# Patient Record
Sex: Female | Born: 1950
Health system: Southern US, Community
[De-identification: ages and names within clinical notes are randomized; demographics above are authoritative.]

## PROBLEM LIST (undated history)

## (undated) DIAGNOSIS — Z5189 Encounter for other specified aftercare: Secondary | ICD-10-CM

## (undated) DIAGNOSIS — B009 Herpesviral infection, unspecified: Secondary | ICD-10-CM

## (undated) DIAGNOSIS — O98519 Other viral diseases complicating pregnancy, unspecified trimester: Secondary | ICD-10-CM

## (undated) DIAGNOSIS — D869 Sarcoidosis, unspecified: Secondary | ICD-10-CM

## (undated) DIAGNOSIS — M48061 Spinal stenosis, lumbar region without neurogenic claudication: Secondary | ICD-10-CM

## (undated) DIAGNOSIS — M549 Dorsalgia, unspecified: Secondary | ICD-10-CM

## (undated) DIAGNOSIS — J45909 Unspecified asthma, uncomplicated: Secondary | ICD-10-CM

## (undated) DIAGNOSIS — G4733 Obstructive sleep apnea (adult) (pediatric): Secondary | ICD-10-CM

## (undated) DIAGNOSIS — N3946 Mixed incontinence: Secondary | ICD-10-CM

## (undated) DIAGNOSIS — Z8619 Personal history of other infectious and parasitic diseases: Secondary | ICD-10-CM

## (undated) DIAGNOSIS — N901 Moderate vulvar dysplasia: Secondary | ICD-10-CM

## (undated) DIAGNOSIS — D219 Benign neoplasm of connective and other soft tissue, unspecified: Secondary | ICD-10-CM

## (undated) DIAGNOSIS — M722 Plantar fascial fibromatosis: Secondary | ICD-10-CM

## (undated) DIAGNOSIS — K219 Gastro-esophageal reflux disease without esophagitis: Secondary | ICD-10-CM

## (undated) DIAGNOSIS — IMO0002 Reserved for concepts with insufficient information to code with codable children: Secondary | ICD-10-CM

## (undated) DIAGNOSIS — Z8742 Personal history of other diseases of the female genital tract: Secondary | ICD-10-CM

## (undated) DIAGNOSIS — J189 Pneumonia, unspecified organism: Secondary | ICD-10-CM

## (undated) DIAGNOSIS — R55 Syncope and collapse: Secondary | ICD-10-CM

## (undated) DIAGNOSIS — J309 Allergic rhinitis, unspecified: Secondary | ICD-10-CM

## (undated) DIAGNOSIS — G8929 Other chronic pain: Secondary | ICD-10-CM

## (undated) DIAGNOSIS — E119 Type 2 diabetes mellitus without complications: Secondary | ICD-10-CM

## (undated) DIAGNOSIS — D126 Benign neoplasm of colon, unspecified: Secondary | ICD-10-CM

## (undated) DIAGNOSIS — F419 Anxiety disorder, unspecified: Secondary | ICD-10-CM

## (undated) DIAGNOSIS — N8501 Benign endometrial hyperplasia: Secondary | ICD-10-CM

## (undated) DIAGNOSIS — R9430 Abnormal result of cardiovascular function study, unspecified: Secondary | ICD-10-CM

## (undated) DIAGNOSIS — N9489 Other specified conditions associated with female genital organs and menstrual cycle: Secondary | ICD-10-CM

## (undated) DIAGNOSIS — F410 Panic disorder [episodic paroxysmal anxiety] without agoraphobia: Secondary | ICD-10-CM

## (undated) DIAGNOSIS — D509 Iron deficiency anemia, unspecified: Secondary | ICD-10-CM

## (undated) DIAGNOSIS — K449 Diaphragmatic hernia without obstruction or gangrene: Secondary | ICD-10-CM

## (undated) HISTORY — DX: Benign neoplasm of connective and other soft tissue, unspecified: D21.9

## (undated) HISTORY — DX: Mixed incontinence: N39.46

## (undated) HISTORY — DX: Other specified conditions associated with female genital organs and menstrual cycle: N94.89

## (undated) HISTORY — DX: Morbid (severe) obesity due to excess calories: E66.01

## (undated) HISTORY — DX: Moderate vulvar dysplasia: N90.1

## (undated) HISTORY — DX: Herpesviral infection, unspecified: B00.9

## (undated) HISTORY — PX: COLPOSCOPY VULVA W/ BIOPSY: SUR282

## (undated) HISTORY — DX: Benign endometrial hyperplasia: N85.01

## (undated) HISTORY — DX: Diaphragmatic hernia without obstruction or gangrene: K44.9

## (undated) HISTORY — DX: Allergic rhinitis, unspecified: J30.9

## (undated) HISTORY — DX: Obstructive sleep apnea (adult) (pediatric): G47.33

## (undated) HISTORY — DX: Herpesviral infection, unspecified: O98.519

## (undated) HISTORY — DX: Syncope and collapse: R55

## (undated) HISTORY — DX: Unspecified asthma, uncomplicated: J45.909

## (undated) HISTORY — DX: Encounter for other specified aftercare: Z51.89

## (undated) HISTORY — DX: Plantar fascial fibromatosis: M72.2

## (undated) HISTORY — PX: ECTOPIC PREGNANCY SURGERY: SHX613

## (undated) HISTORY — PX: VULVECTOMY: SHX1086

## (undated) HISTORY — DX: Anxiety disorder, unspecified: F41.9

## (undated) HISTORY — DX: Gastro-esophageal reflux disease without esophagitis: K21.9

## (undated) HISTORY — DX: Abnormal result of cardiovascular function study, unspecified: R94.30

## (undated) HISTORY — DX: Reserved for concepts with insufficient information to code with codable children: IMO0002

## (undated) HISTORY — DX: Benign neoplasm of colon, unspecified: D12.6

## (undated) HISTORY — DX: Sarcoidosis, unspecified: D86.9

## (undated) HISTORY — DX: Pneumonia, unspecified organism: J18.9

## (undated) HISTORY — DX: Iron deficiency anemia, unspecified: D50.9

## (undated) HISTORY — DX: Personal history of other infectious and parasitic diseases: Z86.19

## (undated) HISTORY — DX: Personal history of other diseases of the female genital tract: Z87.42

---

## 1974-12-06 DIAGNOSIS — IMO0001 Reserved for inherently not codable concepts without codable children: Secondary | ICD-10-CM

## 1974-12-06 DIAGNOSIS — Z5189 Encounter for other specified aftercare: Secondary | ICD-10-CM

## 1974-12-06 HISTORY — DX: Reserved for inherently not codable concepts without codable children: IMO0001

## 1974-12-06 HISTORY — DX: Encounter for other specified aftercare: Z51.89

## 1994-12-06 DIAGNOSIS — D869 Sarcoidosis, unspecified: Secondary | ICD-10-CM

## 1994-12-06 HISTORY — DX: Sarcoidosis, unspecified: D86.9

## 2005-12-06 HISTORY — PX: DILATION AND CURETTAGE OF UTERUS: SHX78

## 2006-12-06 DIAGNOSIS — R87619 Unspecified abnormal cytological findings in specimens from cervix uteri: Secondary | ICD-10-CM

## 2006-12-06 DIAGNOSIS — IMO0002 Reserved for concepts with insufficient information to code with codable children: Secondary | ICD-10-CM

## 2006-12-06 HISTORY — DX: Reserved for concepts with insufficient information to code with codable children: IMO0002

## 2006-12-06 HISTORY — DX: Unspecified abnormal cytological findings in specimens from cervix uteri: R87.619

## 2007-12-07 LAB — HM COLONOSCOPY: HM Colonoscopy: NORMAL

## 2008-09-04 ENCOUNTER — Encounter: Payer: Self-pay | Admitting: Pulmonary Disease

## 2008-09-24 ENCOUNTER — Encounter: Payer: Self-pay | Admitting: Pulmonary Disease

## 2008-10-08 ENCOUNTER — Encounter: Payer: Self-pay | Admitting: Pulmonary Disease

## 2008-10-14 ENCOUNTER — Encounter: Payer: Self-pay | Admitting: Pulmonary Disease

## 2008-10-23 ENCOUNTER — Encounter: Payer: Self-pay | Admitting: Pulmonary Disease

## 2008-11-06 ENCOUNTER — Encounter: Payer: Self-pay | Admitting: Pulmonary Disease

## 2008-11-18 ENCOUNTER — Encounter: Payer: Self-pay | Admitting: Pulmonary Disease

## 2008-12-06 HISTORY — DX: Morbid (severe) obesity due to excess calories: E66.01

## 2009-01-27 ENCOUNTER — Encounter: Payer: Self-pay | Admitting: Pulmonary Disease

## 2009-02-21 ENCOUNTER — Encounter: Payer: Self-pay | Admitting: Gastroenterology

## 2009-02-25 ENCOUNTER — Encounter: Payer: Self-pay | Admitting: Gastroenterology

## 2009-02-27 ENCOUNTER — Encounter: Payer: Self-pay | Admitting: Gastroenterology

## 2009-06-30 ENCOUNTER — Ambulatory Visit: Payer: Self-pay | Admitting: Pulmonary Disease

## 2009-06-30 DIAGNOSIS — Z9989 Dependence on other enabling machines and devices: Secondary | ICD-10-CM

## 2009-06-30 DIAGNOSIS — G4733 Obstructive sleep apnea (adult) (pediatric): Secondary | ICD-10-CM | POA: Insufficient documentation

## 2009-06-30 DIAGNOSIS — D860 Sarcoidosis of lung: Secondary | ICD-10-CM | POA: Insufficient documentation

## 2009-06-30 DIAGNOSIS — J4520 Mild intermittent asthma, uncomplicated: Secondary | ICD-10-CM | POA: Insufficient documentation

## 2009-06-30 DIAGNOSIS — E119 Type 2 diabetes mellitus without complications: Secondary | ICD-10-CM | POA: Insufficient documentation

## 2009-06-30 DIAGNOSIS — J45909 Unspecified asthma, uncomplicated: Secondary | ICD-10-CM | POA: Insufficient documentation

## 2009-06-30 DIAGNOSIS — R058 Other specified cough: Secondary | ICD-10-CM | POA: Insufficient documentation

## 2009-06-30 DIAGNOSIS — R05 Cough: Secondary | ICD-10-CM

## 2009-07-02 ENCOUNTER — Encounter: Payer: Self-pay | Admitting: Pulmonary Disease

## 2009-07-08 ENCOUNTER — Encounter: Payer: Self-pay | Admitting: Pulmonary Disease

## 2009-08-01 ENCOUNTER — Emergency Department (HOSPITAL_COMMUNITY): Admission: EM | Admit: 2009-08-01 | Discharge: 2009-08-01 | Payer: Self-pay | Admitting: Emergency Medicine

## 2009-08-22 ENCOUNTER — Ambulatory Visit: Payer: Self-pay | Admitting: Gastroenterology

## 2009-08-22 DIAGNOSIS — K219 Gastro-esophageal reflux disease without esophagitis: Secondary | ICD-10-CM | POA: Insufficient documentation

## 2009-08-26 ENCOUNTER — Encounter: Payer: Self-pay | Admitting: Pulmonary Disease

## 2009-09-03 ENCOUNTER — Ambulatory Visit: Payer: Self-pay | Admitting: Pulmonary Disease

## 2009-09-16 ENCOUNTER — Telehealth: Payer: Self-pay | Admitting: Gastroenterology

## 2009-10-09 ENCOUNTER — Encounter: Admission: RE | Admit: 2009-10-09 | Discharge: 2009-10-09 | Payer: Self-pay | Admitting: Obstetrics and Gynecology

## 2009-10-13 ENCOUNTER — Ambulatory Visit: Payer: Self-pay | Admitting: Gastroenterology

## 2009-10-14 ENCOUNTER — Ambulatory Visit: Payer: Self-pay | Admitting: Pulmonary Disease

## 2009-12-06 DIAGNOSIS — N8501 Benign endometrial hyperplasia: Secondary | ICD-10-CM

## 2009-12-06 DIAGNOSIS — N9489 Other specified conditions associated with female genital organs and menstrual cycle: Secondary | ICD-10-CM

## 2009-12-06 DIAGNOSIS — N3946 Mixed incontinence: Secondary | ICD-10-CM

## 2009-12-06 DIAGNOSIS — Z8742 Personal history of other diseases of the female genital tract: Secondary | ICD-10-CM

## 2009-12-06 HISTORY — DX: Other specified conditions associated with female genital organs and menstrual cycle: N94.89

## 2009-12-06 HISTORY — DX: Mixed incontinence: N39.46

## 2009-12-06 HISTORY — DX: Benign endometrial hyperplasia: N85.01

## 2009-12-06 HISTORY — PX: HYSTEROSCOPY DIAGNOSTIC: PRO49

## 2009-12-06 HISTORY — DX: Personal history of other diseases of the female genital tract: Z87.42

## 2009-12-06 HISTORY — PX: URETHRAL SLING: SHX2621

## 2009-12-10 ENCOUNTER — Ambulatory Visit (HOSPITAL_COMMUNITY): Admission: RE | Admit: 2009-12-10 | Discharge: 2009-12-10 | Payer: Self-pay | Admitting: Gastroenterology

## 2010-01-05 ENCOUNTER — Ambulatory Visit: Payer: Self-pay | Admitting: Gastroenterology

## 2010-01-09 ENCOUNTER — Ambulatory Visit: Payer: Self-pay | Admitting: Pulmonary Disease

## 2010-01-12 ENCOUNTER — Encounter: Admission: RE | Admit: 2010-01-12 | Discharge: 2010-03-07 | Payer: Self-pay | Admitting: Family Medicine

## 2010-01-21 ENCOUNTER — Encounter: Payer: Self-pay | Admitting: Pulmonary Disease

## 2010-01-27 ENCOUNTER — Ambulatory Visit: Payer: Self-pay | Admitting: Gastroenterology

## 2010-02-02 ENCOUNTER — Encounter: Payer: Self-pay | Admitting: Pulmonary Disease

## 2010-02-24 ENCOUNTER — Encounter: Payer: Self-pay | Admitting: Pulmonary Disease

## 2010-02-26 ENCOUNTER — Inpatient Hospital Stay (HOSPITAL_COMMUNITY): Admission: EM | Admit: 2010-02-26 | Discharge: 2010-03-08 | Payer: Self-pay | Admitting: Emergency Medicine

## 2010-03-05 ENCOUNTER — Telehealth (INDEPENDENT_AMBULATORY_CARE_PROVIDER_SITE_OTHER): Payer: Self-pay | Admitting: *Deleted

## 2010-03-06 DIAGNOSIS — Z8619 Personal history of other infectious and parasitic diseases: Secondary | ICD-10-CM

## 2010-03-06 HISTORY — DX: Personal history of other infectious and parasitic diseases: Z86.19

## 2010-03-10 ENCOUNTER — Telehealth: Payer: Self-pay | Admitting: Gastroenterology

## 2010-03-11 ENCOUNTER — Encounter (INDEPENDENT_AMBULATORY_CARE_PROVIDER_SITE_OTHER): Payer: Self-pay | Admitting: *Deleted

## 2010-03-12 ENCOUNTER — Encounter: Payer: Self-pay | Admitting: Nurse Practitioner

## 2010-03-13 ENCOUNTER — Ambulatory Visit: Payer: Self-pay | Admitting: Internal Medicine

## 2010-03-13 ENCOUNTER — Encounter: Payer: Self-pay | Admitting: Nurse Practitioner

## 2010-03-13 LAB — CONVERTED CEMR LAB
ALT: 14 units/L (ref 0–35)
AST: 16 units/L (ref 0–37)
Albumin: 3.3 g/dL — ABNORMAL LOW (ref 3.5–5.2)
Basophils Absolute: 0 10*3/uL (ref 0.0–0.1)
Basophils Relative: 0.2 % (ref 0.0–3.0)
Chloride: 95 meq/L — ABNORMAL LOW (ref 96–112)
Creatinine, Ser: 1.3 mg/dL — ABNORMAL HIGH (ref 0.4–1.2)
Eosinophils Absolute: 0.1 10*3/uL (ref 0.0–0.7)
Glucose, Bld: 96 mg/dL (ref 70–99)
MCV: 88.8 fL (ref 78.0–100.0)
Monocytes Relative: 7.6 % (ref 3.0–12.0)
Sodium: 134 meq/L — ABNORMAL LOW (ref 135–145)
Total Bilirubin: 0.8 mg/dL (ref 0.3–1.2)
Total Protein: 7.1 g/dL (ref 6.0–8.3)
WBC: 11.8 10*3/uL — ABNORMAL HIGH (ref 4.5–10.5)

## 2010-03-19 ENCOUNTER — Telehealth: Payer: Self-pay | Admitting: Nurse Practitioner

## 2010-03-20 ENCOUNTER — Telehealth (INDEPENDENT_AMBULATORY_CARE_PROVIDER_SITE_OTHER): Payer: Self-pay | Admitting: *Deleted

## 2010-03-20 ENCOUNTER — Ambulatory Visit: Payer: Self-pay | Admitting: Internal Medicine

## 2010-03-20 DIAGNOSIS — D509 Iron deficiency anemia, unspecified: Secondary | ICD-10-CM | POA: Insufficient documentation

## 2010-03-20 DIAGNOSIS — IMO0002 Reserved for concepts with insufficient information to code with codable children: Secondary | ICD-10-CM | POA: Insufficient documentation

## 2010-03-20 DIAGNOSIS — N959 Unspecified menopausal and perimenopausal disorder: Secondary | ICD-10-CM | POA: Insufficient documentation

## 2010-04-01 ENCOUNTER — Telehealth: Payer: Self-pay | Admitting: Gastroenterology

## 2010-04-01 ENCOUNTER — Encounter: Payer: Self-pay | Admitting: Gastroenterology

## 2010-04-03 ENCOUNTER — Encounter: Admission: RE | Admit: 2010-04-03 | Discharge: 2010-04-03 | Payer: Self-pay | Admitting: Obstetrics and Gynecology

## 2010-04-06 ENCOUNTER — Encounter: Payer: Self-pay | Admitting: Pulmonary Disease

## 2010-04-07 ENCOUNTER — Telehealth: Payer: Self-pay | Admitting: Gastroenterology

## 2010-04-07 DIAGNOSIS — A0472 Enterocolitis due to Clostridium difficile, not specified as recurrent: Secondary | ICD-10-CM | POA: Insufficient documentation

## 2010-04-09 ENCOUNTER — Ambulatory Visit: Payer: Self-pay | Admitting: Pulmonary Disease

## 2010-04-13 ENCOUNTER — Encounter: Payer: Self-pay | Admitting: Pulmonary Disease

## 2010-04-23 ENCOUNTER — Encounter: Payer: Self-pay | Admitting: Internal Medicine

## 2010-04-28 ENCOUNTER — Ambulatory Visit: Payer: Self-pay | Admitting: Infectious Diseases

## 2010-04-28 ENCOUNTER — Encounter: Payer: Self-pay | Admitting: Internal Medicine

## 2010-04-30 ENCOUNTER — Encounter: Payer: Self-pay | Admitting: Internal Medicine

## 2010-04-30 LAB — CONVERTED CEMR LAB
ALT: 15 units/L (ref 0–35)
AST: 23 units/L (ref 0–37)
Albumin: 3.7 g/dL (ref 3.5–5.2)
Alkaline Phosphatase: 81 units/L (ref 39–117)
Basophils Absolute: 0 10*3/uL (ref 0.0–0.1)
Chloride: 99 meq/L (ref 96–112)
Creatinine, Ser: 0.92 mg/dL (ref 0.40–1.20)
Folate: 9.9 ng/mL
HCT: 36.6 % (ref 36.0–46.0)
Hemoglobin: 11.7 g/dL — ABNORMAL LOW (ref 12.0–15.0)
Iron: 88 ug/dL (ref 42–145)
MCV: 90.4 fL (ref 78.0–100.0)
Monocytes Absolute: 0.3 10*3/uL (ref 0.1–1.0)
Monocytes Relative: 9 % (ref 3–12)
Potassium: 4.1 meq/L (ref 3.5–5.3)
RBC: 4.05 M/uL (ref 3.87–5.11)
TIBC: 298 ug/dL (ref 250–470)
TSH: 1.469 microintl units/mL (ref 0.350–4.500)
Total Bilirubin: 0.5 mg/dL (ref 0.3–1.2)

## 2010-05-07 ENCOUNTER — Telehealth: Payer: Self-pay | Admitting: Internal Medicine

## 2010-05-20 ENCOUNTER — Encounter (INDEPENDENT_AMBULATORY_CARE_PROVIDER_SITE_OTHER): Payer: Self-pay | Admitting: Obstetrics and Gynecology

## 2010-05-20 ENCOUNTER — Encounter: Payer: Self-pay | Admitting: Anesthesiology

## 2010-05-20 ENCOUNTER — Inpatient Hospital Stay (HOSPITAL_COMMUNITY): Admission: RE | Admit: 2010-05-20 | Discharge: 2010-05-25 | Payer: Self-pay | Admitting: Obstetrics and Gynecology

## 2010-07-06 ENCOUNTER — Telehealth (INDEPENDENT_AMBULATORY_CARE_PROVIDER_SITE_OTHER): Payer: Self-pay | Admitting: *Deleted

## 2010-08-12 ENCOUNTER — Ambulatory Visit: Payer: Self-pay | Admitting: Pulmonary Disease

## 2010-09-09 ENCOUNTER — Telehealth: Payer: Self-pay | Admitting: Pulmonary Disease

## 2010-09-14 ENCOUNTER — Ambulatory Visit: Payer: Self-pay | Admitting: Internal Medicine

## 2010-09-14 DIAGNOSIS — I10 Essential (primary) hypertension: Secondary | ICD-10-CM | POA: Insufficient documentation

## 2010-09-15 LAB — CONVERTED CEMR LAB
Cholesterol: 162 mg/dL (ref 0–200)
HDL: 43.2 mg/dL (ref >39.00)
Hgb A1c MFr Bld: 6.1 % (ref 4.6–6.5)
Total CHOL/HDL Ratio: 4

## 2010-10-14 ENCOUNTER — Encounter: Payer: Self-pay | Admitting: Internal Medicine

## 2010-11-11 ENCOUNTER — Encounter
Admission: RE | Admit: 2010-11-11 | Discharge: 2010-11-11 | Payer: Self-pay | Source: Home / Self Care | Admitting: Obstetrics and Gynecology

## 2010-11-17 ENCOUNTER — Encounter: Payer: Self-pay | Admitting: Pulmonary Disease

## 2010-11-17 ENCOUNTER — Ambulatory Visit: Payer: Self-pay | Admitting: Gastroenterology

## 2010-11-17 DIAGNOSIS — Z860100 Personal history of colon polyps, unspecified: Secondary | ICD-10-CM | POA: Insufficient documentation

## 2010-11-17 DIAGNOSIS — R109 Unspecified abdominal pain: Secondary | ICD-10-CM | POA: Insufficient documentation

## 2010-11-18 LAB — CONVERTED CEMR LAB
Albumin: 3.5 g/dL (ref 3.5–5.2)
Alkaline Phosphatase: 100 units/L (ref 39–117)
CO2: 33 meq/L — ABNORMAL HIGH (ref 19–32)
Chloride: 100 meq/L (ref 96–112)
Eosinophils Absolute: 0.1 10*3/uL (ref 0.0–0.7)
GFR calc non Af Amer: 94.17 mL/min (ref >60.00)
Glucose, Bld: 85 mg/dL (ref 70–99)
HCT: 35.7 % — ABNORMAL LOW (ref 36.0–46.0)
Hemoglobin: 12.3 g/dL (ref 12.0–15.0)
Lymphocytes Relative: 26.7 % (ref 12.0–46.0)
MCHC: 34.5 g/dL (ref 30.0–36.0)
Monocytes Absolute: 0.3 10*3/uL (ref 0.1–1.0)
Monocytes Relative: 8 % (ref 3.0–12.0)
Neutrophils Relative %: 63.3 % (ref 43.0–77.0)
Platelets: 251 10*3/uL (ref 150.0–400.0)
RDW: 15.3 % — ABNORMAL HIGH (ref 11.5–14.6)
Total Bilirubin: 0.5 mg/dL (ref 0.3–1.2)

## 2010-11-27 ENCOUNTER — Telehealth: Payer: Self-pay | Admitting: Gastroenterology

## 2010-12-04 ENCOUNTER — Telehealth (INDEPENDENT_AMBULATORY_CARE_PROVIDER_SITE_OTHER): Payer: Self-pay | Admitting: *Deleted

## 2010-12-26 ENCOUNTER — Encounter: Payer: Self-pay | Admitting: Obstetrics and Gynecology

## 2010-12-27 ENCOUNTER — Encounter: Payer: Self-pay | Admitting: Internal Medicine

## 2010-12-27 ENCOUNTER — Encounter: Payer: Self-pay | Admitting: Obstetrics and Gynecology

## 2011-01-05 ENCOUNTER — Encounter: Payer: Self-pay | Admitting: Internal Medicine

## 2011-01-05 NOTE — Progress Notes (Signed)
Summary: Triage  Phone Note Call from Patient Call back at Home Phone [phone]   Caller: Patient Call For: Dr. Russella Dar Reason for Call: Talk to Nurse Summary of Call: pt. was told from the Health Dept. that she needs a referral to the Disease Control Specialist Initial call taken by: Karna Christmas,  Apr 07, 2010 3:10 PM  Follow-up for Phone Call        (See phone note dated 04/01/10)  Patient 's aunt contacted the health dept due to the patient's dx of c-diff and continued diarrhea.  Patient  reports that she has diarrhea at night, but during the day she has "mushy stool".  Patient  says that the health dept has recommended she be referred to infectious disease MD for her diarrhea.  Patient is currently on her second round of Flagyl and she has a follow up appointment with Willette Cluster RNP  on 04/16/10 to reassess her response to Flagyl  and follow up c-diff.  I have notified the patient she needs to finish her flagyl, keep the appointment with Willette Cluster RNP on 04/16/10 at that time Willette Cluster RNP can assess the need for ID appointment.  Dr Russella Dar please advise if anything needs done in the meantime.  Follow-up by: Darcey Nora RN, CGRN,  Apr 07, 2010 3:30 PM  Additional Follow-up for Phone Call Additional follow up Details #1::        If she feels she is getting better please complete the Flagyl as prescribed and keep FU with Zerita Boers. If she is not getting better she needs to be changed to Vancomycin or proceed with an ID referral or both.  Additional Follow-up by: Meryl Dare MD Clementeen Graham,  Apr 07, 2010 3:37 PM  New Problems: CLOSTRIDIUM DIFFICILE COLITIS (ICD-008.45)   Additional Follow-up for Phone Call Additional follow up Details #2::    Patient  wants to proceed with ID referral I have left a message for the ID ref coordinator at [phone].  Patient  aware that we will call her with an appointment when it is available.  Patient also notified that we are going to cancel the  appointment with [facility] for 04/16/10 Follow-up by: Darcey Nora RN, CGRN,  Apr 07, 2010 3:59 PM  Additional Follow-up for Phone Call Additional follow up Details #3:: Details for Additional Follow-up Action Taken: Records have been sent to ID for scheduling. Darcey Nora RN, CGRN  Apr 08, 2010 10:05 AM  Patient  is scheduled to see Dr Sampson Goon on 04/28/10 10:45 Darcey Nora RN, [facility]  Apr 09, 2010 9:18 AM  New Problems: CLOSTRIDIUM DIFFICILE COLITIS (ICD-008.45)

## 2011-01-05 NOTE — Medication Information (Signed)
Summary: [facility]  [facility]   Imported By: Sherian Rein 04/13/2010 15:06:38  _____________________________________________________________________  External Attachment:    Type:   Image     Comment:   External Document

## 2011-01-05 NOTE — Medication Information (Signed)
Summary: [facility]   Imported By: Lester [place] 10/16/2010 09:15:56  _____________________________________________________________________  External Attachment:    Type:   Image     Comment:   External Document

## 2011-01-05 NOTE — Miscellaneous (Signed)
Summary: CPAP download 09/09/09 to 01/21/10  Clinical Lists Changes Optimal pressure is 9 cm H2O with average AHI 1.9.  Used on 106 of 135 nights with average 5hrs 20 min.  Will have my nurse call to inform patient that CPAP report looked good, will set pressure at 9 cm for her machine. Orders: Added new Referral order of DME Referral (DME) - Signed  Appended Document: CPAP download 09/09/09 to 01/21/10 The patient had verbal understanding that her CPAP pressure will be set at 9 and to call if she has any problems or questions.

## 2011-01-05 NOTE — Miscellaneous (Signed)
Summary: auto CPAP download 09/09/09 to 01/21/10  Clinical Lists Changes Used on 106 of 135 days with average 5hrs 20 min.  Optimal pressure 9 cm with average AHI 2.  Will have pressure set at 9 cm and monitor clinical response.  Will have my nurse call to inform patient of plan. Orders: Added new Referral order of DME Referral (DME) - Signed

## 2011-01-05 NOTE — Letter (Signed)
Summary: [facility]N/[facility]  [facility]   Imported By: Lester Chambers 04/28/2010 10:39:12  _____________________________________________________________________  External Attachment:    Type:   Image     Comment:   External Document

## 2011-01-05 NOTE — Discharge Summary (Signed)
Cynthia Kirk - MRN: [id number] Acct#: [id number] PHYSICIAN DOCUMENTATION SHEET Wed Apr 06 13:58:15 EDT 2011 Eligha Bridegroom. [facility] [address] [place], [place] [address] PHONE: [phone] MRN: [id number] Account #: [id number] Name: Cynthia Kirk Sex: F Age: 60 DOB: 21-Dec-1950 Complaint: Cough Primary Diagnosis: Community acquired pneumonia Arrival Time: 02/26/2010 15:24 Discharge Time: 02/26/2010 23:36 All Providers: Dr Irven Easterly - RES; Dr. Benjiman Core - MD PROVIDER: Dr Irven Easterly - RES HPI: The patient is a 60 year old female who presents with a chief complaint of cough. The history was provided by the patient. Cough since Decemeber with fevers at home this week. Has been on several rounds of abx with no resolution at home. Also complained of one minute episodes of chest pain yesteray after cough that has since resolved The cough started 3 month(s) ago. The onset was gradual. The Pattern is episodic. The Course is improving. It is characterized as cough. The patient's pain was 5 out of 10 at its worst. The patient's pain is 5 out of 10 now. The symptoms are described as moderate. The condition is aggravated by nothing. The condition is relieved by nothing. The symptoms have been associated with chills and fever. The patient has a significant history of similar symptoms previously. 16:55 02/26/2010 by Irven Easterly - RES, Dr ROS: Statement: all systems negative except as marked or noted in the HPI Constitutional: Positive for fever. Negative for weight loss. Eyes: Negative for blurred Vision. ENMT: Negative for epistaxis and sore throat. Cardiovascular: Positive for chest pain. Negative for palpitations, dyspnea and peripheral edema. Respiratory: Positive for cough and wheezing. Gastrointestinal: Positive for nausea. Negative for vomiting, diarrhea and abdominal pain. Genitourinary: Negative for dysuria. Musculoskeletal: Negative for back  pain. Skin: Negative for rash. Neuro: Negative for headache, confusion and Weakness. 16:56 02/26/2010 by Irven Easterly - RES, Dr PMH: Documentation: physician reviewed/amended Historian: patient 1 Streater, Debourah - MRN: [id number] Acct#: [id number] Patient's Current Physicians Patient's Current Physicians (please list PCP first) Pecola Leisure - FP, Betti - General Internal Medicine Past medical history: asthma, hypertension, diabetes, bronchitis, migraine, temporomandibular joint disorder Surgical History: ectopic pregnancy requiring surgery Social History: non-smoker, non-drinker, no drug abuse Allergies Drug Reaction Allergy Note Aspirin hives Latex 16:55 02/26/2010 by Irven Easterly - RES, Dr Home Medications: Documentation: nurse entered directly Medications Medication [Medication] Dosage Frequency Last Dose albuterol Inhl Align Oral [name] HCT Oral Bystolic Oral Dexilant Oral Enablex Oral Nasonex Nasl phentermine Oral ProAir HFA Inhl Pulmicort Inhl Reglan Oral Singulair Oral Vitamin D Oral XANax Oral 16:55 02/26/2010 by Irven Easterly - RES, Dr Physical examination: Vital signs and O2 SAT: reviewed Constitutional: in no acute distress Head and Face: normocephalic, atraumatic Eyes: EOMI, PERRL ENMT: ears, nose and throat normal, mouth and pharynx normal Neck: supple, full range of motion Spine: entire spine non-tender Cardiovascular: regular rate and rhythm, no murmur, rub, or gallop Respiratory: normal, breath sounds equal bilaterally Chest: nontender 2 Cynthia Kirk - MRN: [id number] Acct#: [id number] Abdomen: soft, nontender, nondistended Extremities: normal, no abnormalities, no deformity Neuro: AA&Ox3, Cranial Nerves II-XII intact, motor intact in all extremities, sensation normal , normal reflexes Skin: color normal, no rash Psychiatric: AA&Ox4, no abnormalities of mood or affect Lymph: no adenopathy, other (see note) 16:56 02/26/2010 by Irven Easterly - RES, Dr Reviewed result: Result Type: Cleda Daub: [id number] Step Type: LAB Procedure Name: I STAT CHM 8 PANEL Procedure: I STAT CHM 8 PANEL Result: SODIUM 140 mEq/L [135-145] POTASSIUM 4.0 mEq/L [3.5-5.1] CHLORIDE 103 mEq/L [96-112] BUN 18 mg/dL [4-03]  CREATININE 1.2 mg/dL [6.2-1.3] GLUCOSE 89 mg/dL [08-65] CALCIUM, IONIZED 1.14 mmol/L [1.12-1.32] TCO2 32 mmol/L [0-100] HEMOGLOBIN 12.6 g/dL [78.4-69.6] HEMATOCRIT 37.0 % [36.0-46.0] 17:44 02/26/2010 by Irven Easterly - RES, Dr Reviewed result: Result Type: Cleda Daub: [id number] Step Type: LAB Procedure Name: CARDIACMARKERS, POC Procedure: CARDIAC MARKERS, POC Procedure Notes: COMMENT - OF 0.00-0.09 ng/mL SHOW NO INDICATION OF MYOCARDIAL INJURY. PERSISTENTLY INCREASED TROPONIN VALUES IN THE RANGE OF 0.10-0.24 ng/mL CAN BE SEEN IN: -UNSTABLE ANGINA -CONGESTIVE HEART FAILURE -MYOCARDITIS -CHEST TRAUMA -ARRYHTHMIAS -LATE PRESENTING MI -COPD CLINICAL FOLLOW-UP RECOMMENDED. TROPONIN VALUES >=0.25 ng/mL INDICATE POSSIBLE MYOCARDIAL ISCHEMIA. SERIAL TESTING RECOMMENDED. Result: MYOGLOBIN, POC 92.9 ng/mL [12-200] CKMB, POC 1.3 ng/mL [1.0-8.0] TROPONIN I, POC <0.05 ng/mL [0.00-0.09] COMMENT TROPONIN VALUES IN THE RANGE 3 Cynthia Kirk - MRN: [id number] Acct#: [id number] 17:44 02/26/2010 by Irven Easterly - RES, Dr Reviewed result: Result Type: Cleda Daub: [id number] Step Type: XRAY Procedure Name: DG CHEST 2 VIEW Procedure: DG CHEST 2 VIEW Result: Clinical Data: Chest pain. 57-month history of cough. Shortness of breath. History of hypertension and diabetes. CHEST - 2 VIEW 02/26/2010: Comparison: None. Findings: Cardiomediastinal silhouette unremarkable. Patchy airspace opacities throughout both upper lobes. Relative sparing of the lower lobes and right middle lobe. No pleural effusions. Mild degenerative changes in the thoracic spine. IMPRESSION: Bilateral upper lobe pneumonia. 17:44  02/26/2010 by Irven Easterly - RES, Dr Reviewed result: Result Type: Cleda Daub: [id number] Step Type: LAB Procedure Name: CBC WITH DIFF Procedure: CBC WITH DIFF Result: WBC COUNT 6.4 K/uL [4.0-10.5] RBC COUNT 3.91 MIL/uL [3.87-5.11] HEMOGLOBIN 11.9 g/dL [38.7-56.4] L HEMATOCRIT 35.2 % [36.0-46.0] L MCV 90.0 fL [78.0-100.0] MCHC 34.0 g/dL [33.2-95.1] RDW 88.4 % [11.5-15.5] PLATELET COUNT 228 K/uL [150-400] NEUTROPHIL 64 % [43-77] ABS GRANULOCYTE 4.1 K/uL [1.7-7.7] LYMPHOCYTE 26 % [12-46] ABS LYMPH 1.7 K/uL [0.7-4.0] MONOCYTE 8 % [3-12] ABS MONOCYTE 0.5 K/uL [0.1-1.0] EOSINOPHIL 2 % [0-5] 4 Cynthia Kirk - MRN: [id number] Acct#: [id number] ABS EOS 0.1 K/uL [0.0-0.7] BASOPHIL 0 % [0-1] ABS BASO 0.0 K/uL [0.0-0.1] 18:05 02/26/2010 by Irven Easterly - RES, Dr Reviewed result: Result Type: Cleda Daub: [id number] Step Type: LAB Procedure Name: PREGNANCY, URINE POC Procedure: PREGNANCY, URINE POC Procedure Notes: PREGNANCY, URINE - THE SENSITIVITY OF THIS METHODOLOGY IS >24 mIU/mL Result: PREGNANCY, URINE NEGATIVE 20:50 02/26/2010 by Irven Easterly - RES, Dr Patient disposition: Patient disposition: Admit - Inpatient Bed Primary Diagnosis: community acquired pneumonia 21:16 02/26/2010 by Irven Easterly - RES, Dr MDM: Comments: Middle age F p/w chronic cough for the last 3 months. Pt reports fevers at home but is afebrile here. Lungs CTAB. CXR Pending and labs sent. EKG w/o acute ischemic changes CXR w/ PNA. Vanc and zosyn given. SBP stable. PCP to admit 00:07 02/27/2010 by Irven Easterly - RES, Dr Cardiac: EKG EKG Time Rate Rhythm Axis PQRS & Intervals ST & T 12:08 AM 92 BPM sinus normal normal nonspecific ST changes EKG EKG Time Rate Interpretation 5 Cynthia Kirk - MRN: [id number] Acct#: [id number] EKG EKG Time Rate Interpretation normal sinus rhythm; normal axis; normal intervals; normal PQRS; normal ST and T waves.  Interpretation: normal. 00:13 02/27/2010 by Irven Easterly - RES, Dr Chart electronically signed by Resident 00:14 02/27/2010 by Irven Easterly - RES, Dr PROVIDER: Dr. Benjiman Core - MD Chart electronically signed by ER Physician 07:20 03/02/2010 by Benjiman Core - MD, Dr. Attending: Supervision of: Resident/ medical student: i saw and evaluated the patient, reviewed the resident's note and I agree with the findings and plan., i have reviewed and agree with  the ECG interpretation(s) documented by the resident. 07:20 03/02/2010 by Benjiman Core - MD, Dr. REVIEWER: Joyce Gross - Reviewer Review completed: Documentation completed 13:58 03/11/2010 by Joyce Gross - Reviewer 6

## 2011-01-05 NOTE — Progress Notes (Signed)
Summary: Triage  Phone Note Call from Patient Call back at Home Phone [phone]   Caller: Patient Call For: Dr. Russella Dar Reason for Call: Talk to Nurse Summary of Call: diarrhea x10 days and getting worse. Released from hosp. on Sunday Initial call taken by: Karna Christmas,  March 10, 2010 4:05 PM  Follow-up for Phone Call        Patient  was admitted to [facility] from 02-26-10/ 03-08-10. There is no d/c summary or H&P.  Patient  was on IV antibiotics and by mouth antibiotics.  She says she developed diarrhea while in the hospital that is getting worse.  She stopped her by mouth antibiotics due to diarrhea.  I have asked her to call and notify her primary care she has stopped the antibiotics.  Patient  will come 03/12/10 at 2:00 to see Willette Cluster RNP for diarrhea.  Patient  is offered an appointment for today, but unable to make due to no transportation.   Follow-up by: Darcey Nora RN, CGRN,  March 11, 2010 8:44 AM     Appended Document: Triage Treid to rescheduled pt for this AM due to extender Willette Cluster NP had family emergency and could not see pt this afternoon.  The pt's tranportation provider could not bring her this AM.  I called the nurse at [facility] and they are seeing her at 2PM at her residence tomorrow, 03-13-10.  I  am to fax an order to Diane there at [phone] for stool studies and they will collect stool samples when they are at her home tomorrow.

## 2011-01-05 NOTE — Progress Notes (Signed)
Summary: Triage  Phone Note Call from Patient Call back at Home Phone [phone]   Caller: Patient Call For: Dr. Russella Dar Reason for Call: Talk to Nurse Summary of Call: Pt finished her antibotic on 03-27-10 and the diarrhea and cramps have returned Initial call taken by: Karna Christmas,  April 01, 2010 12:24 PM  Follow-up for Phone Call        After completing flagyl dose the diarrhea has returned multiplt liquid stools.  She reports didn't have solid stool even while on Flagyl.  Per Mike Gip PA and Willette Cluster RNP patient to start vancomycin 250 mg qid and florastor 2 by mouth three times a day or at least two times a day for 14 days for both.  Patient  will also need a follow op appointment when completed.  I have contacted [facility] they are going to contact the patient's insurance and call me back if this needs a prior auth. Follow-up by: Darcey Nora RN, CGRN,  April 01, 2010 1:43 PM  Additional Follow-up for Phone Call Additional follow up Details #1::        I have started a prior auth with [employer] they have submitted it for clinical review and will have a decision in 24 hours. Patient  aware of the above and that we will notify her of [employer]'s decission as soon as we hear. Additional Follow-up by: Darcey Nora RN, CGRN,  April 01, 2010 3:13 PM    Additional Follow-up for Phone Call Additional follow up Details #2::    [employer] has denied coverage for vancomycin.  I have reviewed again with Willette Cluster RNP patient will need to be restarted on Flagyl 250 mg by mouth three times a day for 10 days then be seen in the office for about 2 weeks.  Patient  notified of the above she is also instructed to start on Florastor as perscribed.  Patient  requested Diflucan for yeast infection.  Per Willette Cluster RNP diflucan 100 mg by mouth x1 and she may repeat in 3 days if still symptomatic.  Patient  aware.  i have notified we will call her back with an appointment when one is  available. Follow-up by: Darcey Nora RN, CGRN,  April 02, 2010 8:40 AM  Additional Follow-up for Phone Call Additional follow up Details #3:: Details for Additional Follow-up Action Taken: Patient  notified of the above.  REV scheduled with Willette Cluster RNP 04/16/10 10:00 Additional Follow-up by: Darcey Nora RN, CGRN,  April 02, 2010 2:24 PM  New/Updated Medications: VANCOMYCIN HCL 500 MG SOLR (VANCOMYCIN HCL) 250 mg by mouth qid for 14 days METRONIDAZOLE 250 MG TABS (METRONIDAZOLE) 1 by mouth three times a day for 10 days FLUCONAZOLE 100 MG TABS (FLUCONAZOLE) 1 by mouth may repeat in 3 days if symptoms don't resolve Prescriptions: FLUCONAZOLE 100 MG TABS (FLUCONAZOLE) 1 by mouth may repeat in 3 days if symptoms don't resolve  #2 x 0   Entered by:   Darcey Nora RN, CGRN   Authorized by:   Willette Cluster NP   Signed by:   Darcey Nora RN, CGRN on 04/02/2010   Method used:   Electronically to        [facility]  [address] [id number] (retail)       [address]       [place], [place]  [address]       Ph: [phone] or [phone]  Fax: [phone]   RxID:   [id number] METRONIDAZOLE 250 MG TABS (METRONIDAZOLE) 1 by mouth three times a day for 10 days  #30 x 0   Entered by:   Darcey Nora RN, CGRN   Authorized by:   Willette Cluster NP   Signed by:   Darcey Nora RN, CGRN on 04/02/2010   Method used:   Electronically to        [facility]  [address] [id number] (retail)       [address]       [place], [place]  [address]       Ph: [phone] or [phone]       Fax: [phone]   RxID:   [id number] VANCOMYCIN HCL 500 MG SOLR (VANCOMYCIN HCL) 250 mg by mouth qid for 14 days  #14 days x 0   Entered by:   Darcey Nora RN, CGRN   Authorized by:   Sammuel Cooper PA-c   Signed by:   Darcey Nora RN, CGRN on 04/01/2010   Method used:   Historical   RxID:   [id number]

## 2011-01-05 NOTE — Assessment & Plan Note (Signed)
Summary: NEW MEDICARE PT--PKG/OFF-#-STC   Vital Signs:  Patient profile:   60 year old female Height:      65 inches Weight:      271.75 pounds BMI:     45.39 O2 Sat:      97 % on Room air Temp:     97.7 degrees F oral Pulse rate:   75 / minute BP sitting:   100 / 60  (left arm) Cuff size:   large  Vitals Entered ByZella Ball Ewing (March 20, 2010 9:40 AM)  O2 Flow:  Room air  Preventive Care Screening  Colonoscopy:    Date:  12/07/2007    Next Due:  12/2012    Results:  normal   Mammogram:    Date:  10/09/2009    Results:  normal   Pap Smear:    Date:  09/05/2009    Results:  normal   Last Pneumovax:    Date:  03/06/2008    Results:  Historical   Last Tetanus Booster:    Date:  12/06/2004    Results:  Tdap   CC: New Pt. New medicare/RE   Primary Care Provider:  Haskel Schroeder, MD  CC:  New Pt. New medicare/RE.  History of Present Illness: here to establish;  now being tx with flagy for c diff colitis for 7 days, some improved but still with several stools per day;  Pt denies CP, sob, doe, wheezing, orthopnea, pnd, worsening LE edema, palps, dizziness or syncope  Pt denies new neuro symptoms such as headache, facial or extremity weakness.  Does c/o mild nonprod cough persistent; recent CT with ILD,? PNA.   Has  been able to not take the bentyl for about 3 days.  Also with some sense of urinary retention, small amounts assoc with BM's.  Does some back pain across the back , no radiation, worse to walk or move around and housework, worse at night with radiation to left leg, unable to sleep for last 2 nights.    Here for wellness Diet: Heart Healthy or DM if diabetic Physical Activities: Sedentary Depression/mood screen: Negative Hearing: Intact bilateral Visual Acuity: Grossly normal, gets exam yearly ADL's: Capable  Fall Risk: None Home Safety: Good Cognitive Impairment:  Gen appearance, affect, speech, memory, attention & motor skills grossly intact End-of-Life  Planning: Advance directive - Full code/I agree   Problems Prior to Update: 1)  Urinary Retention  (ICD-788.20) 2)  Lumbar Radiculopathy, Left  (ICD-724.4) 3)  Cough  (ICD-786.2) 4)  Anemia-iron Deficiency  (ICD-280.9) 5)  Menopausal Disorder  (ICD-627.9) 6)  Colonic Polyps, Hx of  (ICD-V12.72) 7)  Diarrhea of Presumed Infectious Origin  (ICD-009.3) 8)  Pre-operative Respiratory Examination  (ICD-V72.82) 9)  Abdominal Pain Other Specified Site  (ICD-789.09) 10)  Personal Hx Colonic Polyps  (ICD-V12.72) 11)  Flatulence-gas-bloating  (ICD-787.3) 12)  Gerd  (ICD-530.81) 13)  Hoarseness  (ICD-784.42) 14)  Allergic Rhinitis  (ICD-477.9) 15)  Obstructive Sleep Apnea  (ICD-327.23) 16)  Asthma  (ICD-493.90) 17)  Dm  (ICD-250.00) 18)  Sarcoidosis  (ICD-135)  Medications Prior to Update: 1)  Singulair 10 Mg Tabs (Montelukast Sodium) .... One By Mouth At Bedtime 2)  Nasonex 50 Mcg/act Susp (Mometasone Furoate) .... Two Sprays Once Daily 3)  Proair Hfa 108 (90 Base) Mcg/act Aers (Albuterol Sulfate) .... 2 Puffs Four Times Daily As Needed 4)  Vitamin D 2000 Unit Tabs (Cholecalciferol) .[name].. 1 By Mouth Daily 5)  Enablex 7.5 Mg Xr24h-Tab (Darifenacin Hydrobromide) .[name].. 1 By  Mouth Daily 6)  Dexilant 60 Mg Cpdr (Dexlansoprazole) .[name].. 1 By Mouth Daily 7)  [name] Hct 40-25 Mg Tabs (Olmesartan Medoxomil-Hctz) .[name].. 1 By Mouth Daily 8)  Phentermine Hcl 37.5 Mg Tabs (Phentermine Hcl) .[name].. 1 By Mouth Daily 9)  Bystolic 5 Mg Tabs (Nebivolol Hcl) .[name].. 1 By Mouth Daily 10)  Reglan 5 Mg Tabs (Metoclopramide Hcl) .... Take One By Mouth Three Times A Day As Needed 11)  Align   Caps (Misc Intestinal Flora Regulat) .... Take One Capsule By Mouth Daily 12)  Xanax 1 Mg Tabs (Alprazolam) .... As Needed 13)  Albuterol Sulfate (2.5 Mg/57ml) 0.083% Nebu (Albuterol Sulfate) .... One Vial Nebulized Up To Four Times Per Day As Needed 14)  Pulmicort 0.5 Mg/66ml Susp (Budesonide) .... One Vial Nebulized Two Times A Day 15)   Questran Light 4 Gm/dose Powd (Cholestyramine Light) .... Take 4 Grams in 8 Ox of Juice, Can Not Take Within 2 Hours of Any Other Medication. Take 1-2 Times Daily 16)  Bentyl 10 Mg Caps (Dicyclomine Hcl) .... Take 1 Tab 1-2 Times Daily For Cramping and Spasms 17)  Florastor 250 Mg Caps (Saccharomyces Boulardii) .... Take 1 Tab Daily  Current Medications (verified): 1)  Singulair 10 Mg Tabs (Montelukast Sodium) .... One By Mouth At Bedtime 2)  Nasonex 50 Mcg/act Susp (Mometasone Furoate) .... Two Sprays Once Daily 3)  Proair Hfa 108 (90 Base) Mcg/act Aers (Albuterol Sulfate) .... 2 Puffs Four Times Daily As Needed 4)  Vitamin D 2000 Unit Tabs (Cholecalciferol) .[name].. 1 By Mouth Daily 5)  Enablex 7.5 Mg Xr24h-Tab (Darifenacin Hydrobromide) .[name].. 1 By Mouth Daily 6)  Dexilant 60 Mg Cpdr (Dexlansoprazole) .[name].. 1 By Mouth Daily 7)  [name] Hct 40-25 Mg Tabs (Olmesartan Medoxomil-Hctz) .[name].. 1 By Mouth Daily 8)  Phentermine Hcl 37.5 Mg Tabs (Phentermine Hcl) .[name].. 1 By Mouth Daily 9)  Bystolic 5 Mg Tabs (Nebivolol Hcl) .[name].. 1 By Mouth Daily 10)  Reglan 5 Mg Tabs (Metoclopramide Hcl) .... Take One By Mouth Three Times A Day As Needed 11)  Align   Caps (Misc Intestinal Flora Regulat) .... Take One Capsule By Mouth Daily 12)  Xanax 1 Mg Tabs (Alprazolam) .... As Needed 13)  Albuterol Sulfate (2.5 Mg/73ml) 0.083% Nebu (Albuterol Sulfate) .... One Vial Nebulized Up To Four Times Per Day As Needed 14)  Pulmicort 0.5 Mg/9ml Susp (Budesonide) .... One Vial Nebulized Two Times A Day 15)  Questran Light 4 Gm/dose Powd (Cholestyramine Light) .... Take 4 Grams in 8 Ox of Juice, Can Not Take Within 2 Hours of Any Other Medication. Take 1-2 Times Daily 16)  Bentyl 10 Mg Caps (Dicyclomine Hcl) .... Take 1 Tab 1-2 Times Daily For Cramping and Spasms 17)  Florastor 250 Mg Caps (Saccharomyces Boulardii) .... Take 1 Tab Daily 18)  Tramadol Hcl 50 Mg Tabs (Tramadol Hcl) .[name].. 1 - 2 By Mouth Q 6 Hrs As Needed Pain 19)  Shower  Chair .... Use Asd  Allergies (verified): 1)  ! Asa 2)  ! * Latex  Past History:  Past Surgical History: Last updated: 06/30/2009 Vulvectomy for cancerous cells Tubal pregnancy  Family History: Last updated: 03/20/2010 Father - HTN, CAD s/p MI, Seizure Mother - MVA Sister - Kidney problem from birth, on dialysis Daughter - Asthma Son - Asthma Family History of Diabetes: Father? No FH of Colon Cancer: Uncle -Lung Cancer sister with osteoporosis  Social History: Last updated: 01/27/2010 She is divorced, and has two kids.   Denies smoking.  Retired. Worked as a Scientist, research (life sciences). Alcohol Use - no Illicit Drug Use - no  Risk Factors: Smoking Status: never (06/30/2009)  Past Medical History: Obstructive sleep apnea      - RDI 10.5      - CPAP 15 cm H2O DM Sarcoidosis      - Dx 1996 @ [facility] with Tbx HTN GERD      - Dr. Karolee Ohs Goiter Asthma      - Spirometry 06/30/09 FEV1 1.69(87%), FVC 2.17(83%), FEV1% 78 Anemia Anxiety Disorder Adenomatous Colon Polyps Hypertension Pneumonia TMJ      - Dr. Christia Reading CDiff colitis  - april 2011 Colonic polyps, hx of  Anemia-iron deficiency  Family History: Reviewed history from 01/27/2010 and no changes required. Father - HTN, CAD s/p MI, Seizure Mother - MVA Sister - Kidney problem from birth, on dialysis Daughter - Asthma Son - Asthma Family History of Diabetes: Father? No FH of Colon Cancer: Uncle -Lung Cancer sister with osteoporosis  Social History: Reviewed history from 01/27/2010 and no changes required. She is divorced, and has two kids.   Denies smoking.   Retired. Worked as a Scientist, research (life sciences). Alcohol Use - no Illicit Drug Use - no  Review of Systems  The patient denies anorexia, fever, vision loss, decreased hearing, hoarseness, chest pain, syncope, dyspnea on exertion, peripheral edema, prolonged cough, headaches, hemoptysis, melena, severe  indigestion/heartburn, hematuria, muscle weakness, suspicious skin lesions, difficulty walking, depression, unusual weight change, abnormal bleeding, enlarged lymph nodes, angioedema, and breast masses.         all otherwise negative per pt -    Physical Exam  General:  alert and overweight-appearing.   Head:  normocephalic and atraumatic.   Eyes:  vision grossly intact, pupils equal, and pupils round.   Ears:  R ear normal and L ear normal.   Nose:  no external deformity and no nasal discharge.   Mouth:  no gingival abnormalities and pharynx pink and moist.   Neck:  supple and no masses.   Lungs:  normal respiratory effort and normal breath sounds.   Heart:  normal rate and regular rhythm.   Abdomen:  soft, non-tender, and normal bowel sounds.   Msk:  moderate midlind upper lumbar tender o/w no spine tender Extremities:  no edema, no erythema  Neurologic:  cranial nerves II-XII intact, strength normal in all extremities, sensation intact to light touch, and DTRs symmetrical and normal. except for decreased motor 4+/5 left hip flexion, decreased sensation left ant thigh, and diminshed left patellar reflex compared to right   Impression & Recommendations:  Problem # 1:  Preventive Health Care (ICD-V70.0)  Overall doing well, age appropriate education and counseling updated and referral for appropriate preventive services done unless declined, immunizations up to date or declined, diet counseling done if overweight, urged to quit smoking if smokes , most recent labs reviewed and current ordered if appropriate, ecg reviewed or declined (interpretation per ECG scanned in the EMR if done); information regarding Medicare Prevention requirements given if appropriate   Orders: First annual wellness visit with prevention plan  (Z6109)  Problem # 2:  MENOPAUSAL DISORDER (ICD-627.9) for dxa as she is due Orders: T-Bone Densitometry (60454)  Problem # 3:  LUMBAR RADICULOPATHY, LEFT  (ICD-724.4)  suspect lumbar l2 or l3 disc pathology - will need pain control, LS Spine MRI, and refer ortho, ? urinary retention assoc  Orders: Orthopedic Surgeon Referral (Ortho Surgeon) Radiology Referral (Radiology) Prescription Created Electronically (  [email or web address])  Her updated medication list for this problem includes:    Tramadol Hcl 50 Mg Tabs (Tramadol hcl) .[name]... 1 - 2 by mouth q 6 hrs as needed pain  Problem # 4:  URINARY RETENTION (ICD-788.20)  as above, to also check urine studies  Orders: T-Culture, Urine ([id number]) TLB-Udip w/ Micro ([id number])  Complete Medication List: 1)  Singulair 10 Mg Tabs (Montelukast sodium) .... One by mouth at bedtime 2)  Nasonex 50 Mcg/act Susp (Mometasone furoate) .... Two sprays once daily 3)  Proair Hfa 108 (90 Base) Mcg/act Aers (Albuterol sulfate) .... 2 puffs four times daily as needed 4)  Vitamin D 2000 Unit Tabs (Cholecalciferol) .[name].. 1 by mouth daily 5)  Enablex 7.5 Mg Xr24h-tab (Darifenacin hydrobromide) .[name].. 1 by mouth daily 6)  Dexilant 60 Mg Cpdr (Dexlansoprazole) .[name].. 1 by mouth daily 7)  [name] Hct 40-25 Mg Tabs (Olmesartan medoxomil-hctz) .[name].. 1 by mouth daily 8)  Phentermine Hcl 37.5 Mg Tabs (Phentermine hcl) .[name].. 1 by mouth daily 9)  Bystolic 5 Mg Tabs (Nebivolol hcl) .[name].. 1 by mouth daily 10)  Reglan 5 Mg Tabs (Metoclopramide hcl) .... Take one by mouth three times a day as needed 11)  Align Caps (Misc intestinal flora regulat) .... Take one capsule by mouth daily 12)  Xanax 1 Mg Tabs (Alprazolam) .... As needed 13)  Albuterol Sulfate (2.5 Mg/45ml) 0.083% Nebu (Albuterol sulfate) .... One vial nebulized up to four times per day as needed 14)  Pulmicort 0.5 Mg/62ml Susp (Budesonide) .... One vial nebulized two times a day 15)  Questran Light 4 Gm/dose Powd (Cholestyramine light) .... Take 4 grams in 8 ox of juice, can not take within 2 hours of any other medication. take 1-2 times daily 16)  Bentyl 10 Mg Caps (Dicyclomine  hcl) .... Take 1 tab 1-2 times daily for cramping and spasms 17)  Florastor 250 Mg Caps (Saccharomyces boulardii) .... Take 1 tab daily 18)  Tramadol Hcl 50 Mg Tabs (Tramadol hcl) .[name].. 1 - 2 by mouth q 6 hrs as needed pain 19)  Shower Chair  .... Use asd  Patient Instructions: 1)  please schedule the bone density  before leaving today 2)  Please take all new medications as prescribed 3)  Continue all previous medications as before this visit 4)  You will be contacted about the referral(s) to: MRI, and orthopedic for the lower back 5)  Please schedule a follow-up appointment in 6 months or sooner if needed Prescriptions: SHOWER CHAIR use asd  #1 x 0   Entered and Authorized by:   Corwin Levins MD   Signed by:   Corwin Levins MD on 03/20/2010   Method used:   Print then Give to Patient   RxID:   [id number] TRAMADOL HCL 50 MG TABS (TRAMADOL HCL) 1 - 2 by mouth q 6 hrs as needed pain  #60 x 1   Entered and Authorized by:   Corwin Levins MD   Signed by:   Corwin Levins MD on 03/20/2010   Method used:   Print then Give to Patient   RxID:   [id number]    Immunization History:  Pneumovax Immunization History:    Pneumovax:  historical (03/06/2008)

## 2011-01-05 NOTE — Progress Notes (Signed)
Summary: medication Refill  Phone Note Refill Request  on May 07, 2010 9:56 AM  Refills Requested: Medication #1:  XANAX 1 MG TABS 2 by mouth daily as needed   Dosage confirmed as above?Dosage Confirmed   Notes: [facility] [address] [phone] Initial call taken by: Scharlene Gloss,  May 07, 2010 9:57 AM  Follow-up for Phone Call        Rx faxed to pharmacy Follow-up by: Margaret Pyle, CMA,  May 07, 2010 1:13 PM    New/Updated Medications: XANAX 1 MG TABS (ALPRAZOLAM) 1 by mouth two times a day as needed  - to fill june 2 , 2011 Prescriptions: XANAX 1 MG TABS (ALPRAZOLAM) 1 by mouth two times a day as needed  - to fill june 2 , 2011  #60 x 2   Entered and Authorized by:   Corwin Levins MD   Signed by:   Corwin Levins MD on 05/07/2010   Method used:   Print then Give to Patient   RxID:   [id number]  done hardcopy to [facility] side B - dahlia Corwin Levins MD  May 07, 2010 1:10 PM

## 2011-01-05 NOTE — Miscellaneous (Signed)
Summary: Stool studies  Clinical Lists Changes  Problems: Added new problem of DIARRHEA OF PRESUMED INFECTIOUS ORIGIN (ICD-009.3) Orders: Added new Test order of T-Culture, C-Diff Toxin A/B [phone]) - Signed Added new Test order of T-Culture, Stool ([id number]) - Signed Added new Test order of T-Fecal WBC ([id number]) - Signed

## 2011-01-05 NOTE — Assessment & Plan Note (Signed)
Summary: Diarrhea, x 10 days, worsening   History of Present Illness Visit Type: Follow-up Visit Primary GI MD: Elie Goody MD [facility] Primary Provider: Haskel Schroeder, MD Chief Complaint: diarrhea x 2 weeks,rectal bleeding, abdominal pain History of Present Illness:   Patient is a 60 year old female with multiple medical problems including HTN, asthma, sarcoidosis, OSA, anxiety and obesity followed by Dr Russella Dar for history of GERD, colon polyps. Patient last seen Feb. 2011. Patient recently hospitalized for PNA which was refractory to an extended course of outpatient antibiotics. While hospitalized patient developed diarrhea, stool culture was negative. I don't see that any other stool studies were done. Unfortunately I only have the ER report (admitting H&P and discharge summary not seen in hospital records). Since hospital discharge a week ago patient has been having up to 10 loose BMs a day associated  with lower abdominal cramping.  Patient reports fevers, sometimes up to 101. She has noticed a small amount of rectal bleeding which she attributes to anal excoriations from diarrhea.      GI Review of Systems    Reports abdominal pain and  nausea.     Location of  Abdominal pain: lower abdomen.    Denies acid reflux, belching, bloating, chest pain, dysphagia with liquids, dysphagia with solids, heartburn, loss of appetite, vomiting, vomiting blood, weight loss, and  weight gain.      Reports diarrhea and  rectal bleeding.     Denies anal fissure, black tarry stools, change in bowel habit, constipation, diverticulosis, fecal incontinence, heme positive stool, hemorrhoids, irritable bowel syndrome, jaundice, light color stool, liver problems, and  rectal pain.    Current Medications (verified): 1)  Singulair 10 Mg Tabs (Montelukast Sodium) .... One By Mouth At Bedtime 2)  Nasonex 50 Mcg/act Susp (Mometasone Furoate) .... Two Sprays Once Daily 3)  Proair Hfa 108 (90 Base) Mcg/act Aers  (Albuterol Sulfate) .... 2 Puffs Four Times Daily As Needed 4)  Vitamin D 2000 Unit Tabs (Cholecalciferol) .[name].. 1 By Mouth Daily 5)  Enablex 7.5 Mg Xr24h-Tab (Darifenacin Hydrobromide) .[name].. 1 By Mouth Daily 6)  Dexilant 60 Mg Cpdr (Dexlansoprazole) .[name].. 1 By Mouth Daily 7)  [name] Hct 40-25 Mg Tabs (Olmesartan Medoxomil-Hctz) .[name].. 1 By Mouth Daily 8)  Phentermine Hcl 37.5 Mg Tabs (Phentermine Hcl) .[name].. 1 By Mouth Daily 9)  Bystolic 5 Mg Tabs (Nebivolol Hcl) .[name].. 1 By Mouth Daily 10)  Reglan 5 Mg Tabs (Metoclopramide Hcl) .... Take One By Mouth Three Times A Day As Needed 11)  Align   Caps (Misc Intestinal Flora Regulat) .... Take One Capsule By Mouth Daily 12)  Xanax 1 Mg Tabs (Alprazolam) .... As Needed 13)  Albuterol Sulfate (2.5 Mg/55ml) 0.083% Nebu (Albuterol Sulfate) .... One Vial Nebulized Up To Four Times Per Day As Needed 14)  Pulmicort 0.5 Mg/90ml Susp (Budesonide) .... One Vial Nebulized Two Times A Day  Allergies (verified): 1)  ! Asa 2)  ! * Latex  Past History:  Past Medical History: Reviewed history from 01/09/2010 and no changes required. Obstructive sleep apnea      - RDI 10.5      - CPAP 15 cm H2O DM Sarcoidosis      - Dx 1996 @ [facility] with Tbx HTN GERD      - Dr. Karolee Ohs Goiter Asthma      - Spirometry 06/30/09 FEV1 1.69(87%), FVC 2.17(83%), FEV1% 78 Anemia Anxiety Disorder Adenomatous Colon Polyps Hypertension Pneumonia TMJ      -  Dr. Christia Reading  Past Surgical History: Reviewed history from 06/30/2009 and no changes required. Vulvectomy for cancerous cells Tubal pregnancy  Family History: Reviewed history from 01/27/2010 and no changes required. Father - HTN, CAD s/p MI, Seizure Mother - MVA Sister - Kidney problem from birth, on dialysis Daughter - Asthma Son - Asthma Family History of Diabetes: Father? No FH of Colon Cancer: Uncle -Lung Cancer  Social History: Reviewed history from 01/27/2010 and no changes required. She is divorced,  and has two kids.   Denies smoking.   Retired. 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  The patient denies anemia, anxiety-new, arthritis/joint pain, back pain, blood in urine, breast changes/lumps, change in vision, confusion, cough, coughing up blood, depression-new, fainting, headaches-new, hearing problems, heart murmur, heart rhythm changes, itching, menstrual pain, night sweats, nosebleeds, pregnancy symptoms, shortness of breath, skin rash, sleeping problems, sore throat, swelling of feet/legs, swollen lymph glands, thirst - excessive , urination - excessive , urination changes/pain, urine leakage, vision changes, and voice change.    Vital Signs:  Patient profile:   60 year old female Height:      65 inches Weight:      268.50 pounds BMI:     44.84 Temp:     99.3 degrees F oral Pulse rate:   76 / minute Pulse rhythm:   regular BP sitting:   94 / 60  (left arm) Cuff size:   large  Vitals Entered By: June McMurray CMA Duncan Dull) (March 13, 2010 1:27 PM)  Physical Exam  General:  Pleasant, obese, black female  Mouth:  No oral lesions. Tongue moist.  Neck:  no obvious masses  Lungs:  Clear throughout to auscultation. Heart:  RRR. Abdomen:  Soft, obese, mild to moderate diffuse lower abdominal tenderness. Rectal:  Questionable small,  midline posterior fissure. Digital exam comfortable for patient so it was not completed.  Neurologic:  Alert and  oriented x4;  grossly normal neurologically. Psych:  Alert and cooperative. Normal mood and affect.   Impression & Recommendations:  Problem # 1:  DIARRHEA OF PRESUMED INFECTIOUS ORIGIN (ICD-009.3) Assessment Deteriorated Developed during recent hospitalization for PNA. Suspect C-Difficile given recent antibiotics. Patient is having associated lower abdominal cramps and intermittent  fevers. She has mild-moderate, diffuse lower abdominal tenderness. Patient doesn't appear dehydrated, vitals signs stable except for low grade temp. Plan: Obtain labs today. Given patient's presentation and her multiple medical problems I will go ahead and start oral Vancomycin.  She may use Questran twice daily. Will try Bentyl for cramps. Start Probiotic, samples  given.  Push oral fluids. Low residue diet. Patient will call our on- call physician, or go to the ER over the weekend should her condition worsen. Otherwise patient will call our office on Monday morning for condition update.    Addendum:  Vancomycin pills not financially feasible for patient. We called a compounding pharmacy for Vancomycin suspension but they do not compound medication over the weekend. Will go ahead with Flagyl for now. May need to add Vancomycin pending patient's clinical course.  Orders: TLB-CBC Platelet - w/Differential ([id number]) TLB-CMP (Comprehensive Metabolic Pnl) ([id number]) T-Culture, C-Diff Toxin A/B ([id number]) T-Fecal WBC ([id number])  Problem # 2:  SARCOIDOSIS (ICD-135) Assessment: Comment Only Not active according to Dr. Evlyn Courier Feb. 2011 office note.  Problem # 3:  ASTHMA (ICD-493.90)  Assessment: Comment Only  Problem # 4:  PERSONAL HX COLONIC POLYPS (ICD-V12.72) Assessment: Comment Only Patient [place] We requested records in the past but I do not see where they were recieved. We need her colonoscopy report to determine date of surveillance exam. Will request records again.   Patient Instructions: 1)  CBC, CMET, Stool for lactoferrin and Cdiff X3 2)  Several glasses of water daily 3)  Low residue diet. 4)  Flagyl 250mg  three times a day for 14 days 5)  Call us Monday or Tuesday with update 6)  Bentyl twice daily 7)  Questran one- two times a day 8)  follow up 10 days.  9)  Florastor one daily for 2 weeks. Prescriptions: BENTYL 10 MG  CAPS (DICYCLOMINE HCL) Take 1 tab 1-2 times daily for cramping and spasms  #60 x 1   Entered by:   Lowry Ram NCMA   Authorized by:   Willette Cluster NP   Signed by:   Lowry Ram NCMA on 03/13/2010   Method used:   [facility]  [id number]       [address]       [place], [place]  [address]       Ph: [phone] or [phone]       Fax: [phone]   RxID:   [id number] QUESTRAN LIGHT 4 GM/DOSE POWD (CHOLESTYRAMINE LIGHT) Take 4 grams in 8 ox of juice, can not take within 2 hours of any other medication. Take 1-2 times daily  #110 grams x 0   Entered by:   Lowry Ram NCMA   Authorized by:   Willette Cluster NP   Signed by:   Lowry Ram NCMA on 03/13/2010   Method used:   [facility]        [facility]  [address] [id number]       [address]       [place], [place]  [address]       Ph: [phone] or [phone]       Fax: [phone]   RxID:   [id number]

## 2011-01-05 NOTE — Progress Notes (Signed)
Summary: Flector 1.3% patch DENIED  Phone Note Refill Request Message from:  Fax from Pharmacy on July 06, 2010 2:35 PM  Refills Requested: Medication #1:  Flector 1.3% patch   Dosage confirmed as above?Dosage Confirmed   Brand Name Necessary? No   Supply Requested: 1 month   Last Refilled: 06/09/2010 [facility] [address]   Method Requested: Fax to Local Pharmacy Initial call taken by: Michel Bickers CMA,  July 06, 2010 2:37 PM  Follow-up for Phone Call        This medication should be filled by pt's PCP. Form faxed back to pharmacy with DENIAL.Michel Bickers CMA  July 06, 2010 2:39 PM

## 2011-01-05 NOTE — Assessment & Plan Note (Signed)
Summary: new pt c.diff   Referring Provider:  Osborn Coho Primary Provider:  Corwin Levins MD  CC:  new patient / c. diff.  History of Present Illness:   march 24th had a bad pna requiring admission for abotu 10 days.  During her hospitalizaiton she developed diarrhea but c diff test was not done (only stool cx).  Discharged on abx (no discharge summary available).  SHe improved from a pulm point of view but the diarrhea persisted.  Saw Dr Darcella Gasman PA and c diff was + so started flagyl (03/13/10).    Took first course for 14 days but no benefit.  Course was repeat again for 10 days.  Still no benefit.  Was having 5-10 stools a day that were watery.  No occsa mild amt of blood (mild BRBPR).  ALso has bad crampy pain since that time      Preventive Screening-Counseling & Management  Alcohol-Tobacco     Alcohol drinks/day: 0     Smoking Status: never  Caffeine-Diet-Exercise     Caffeine use/day: 0     Does Patient Exercise: yes     Type of exercise: walking     Exercise (avg: min/session): 30-60     Times/week: 5  Safety-Violence-Falls     Seat Belt Use: yes   Updated Prior Medication List: PULMICORT 0.5 MG/2ML SUSP (BUDESONIDE) one vial nebulized two times a day as needed SINGULAIR 10 MG TABS (MONTELUKAST SODIUM) one by mouth at bedtime PROAIR HFA 108 (90 BASE) MCG/ACT AERS (ALBUTEROL SULFATE) 2 puffs four times daily as needed FLONASE 50 MCG/ACT SUSP (FLUTICASONE PROPIONATE) two sprays once daily VITAMIN D 2000 UNIT TABS (CHOLECALCIFEROL) 1 by mouth daily ENABLEX 7.5 MG XR24H-TAB (DARIFENACIN HYDROBROMIDE) 1 by mouth daily DEXILANT 60 MG CPDR (DEXLANSOPRAZOLE) 1 by mouth daily [name] HCT 40-25 MG TABS (OLMESARTAN MEDOXOMIL-HCTZ) 1 by mouth daily BYSTOLIC 5 MG TABS (NEBIVOLOL HCL) 1 by mouth daily REGLAN 5 MG TABS (METOCLOPRAMIDE HCL) take one by mouth three times a day as needed ALIGN   CAPS (MISC INTESTINAL FLORA REGULAT) Take one capsule by mouth daily XANAX 1 MG TABS  (ALPRAZOLAM) 2 by mouth daily as needed ALBUTEROL SULFATE (2.5 MG/3ML) 0.083% NEBU (ALBUTEROL SULFATE) one vial nebulized up to four times per day as needed QUESTRAN LIGHT 4 GM/DOSE POWD (CHOLESTYRAMINE LIGHT) Take 4 grams in 8 ox of juice, can not take within 2 hours of any other medication. Take 1-2 times daily BENTYL 10 MG CAPS (DICYCLOMINE HCL) Take 1 tab 1-2 times daily for cramping and spasms FLORASTOR 250 MG CAPS (SACCHAROMYCES BOULARDII) Take 1 tab daily TRAMADOL HCL 50 MG TABS (TRAMADOL HCL) 1 - 2 by mouth q 6 hrs as needed pain * SHOWER CHAIR use asd FLUCONAZOLE 100 MG TABS (FLUCONAZOLE) 1 by mouth may repeat in 3 days if symptoms don't resolve  Current Allergies (reviewed today): ! ASA ! * LATEX Past History:  Past Surgical History: Last updated: 06/30/2009 Vulvectomy for cancerous cells Tubal pregnancy  Family History: Last updated: 03/20/2010 Father - HTN, CAD s/p MI, Seizure Mother - MVA Sister - Kidney problem from birth, on dialysis Daughter - Asthma Son - Asthma Family History of Diabetes: Father? No FH of Colon Cancer: Uncle -Lung Cancer sister with osteoporosis  Social History: Last updated: 01/27/2010 She is divorced, and has two kids.   Denies smoking.   Retired. Worked as a Scientist, research (life sciences). Alcohol Use - no Illicit Drug Use - no  Risk Factors: Alcohol Use: 0 (04/28/2010)  Caffeine Use: 0 (04/28/2010) Exercise: yes (04/28/2010)  Risk Factors: Smoking Status: never (04/28/2010)  Past Medical History: Obstructive sleep apnea      - RDI 10.5      - CPAP 9 cm H2O DM - diet controlled Sarcoidosis      - Dx 1996 @ [facility] with Tbx. stable and in remission HTN GERD      - Dr. Karolee Ohs Goiter Asthma      - Spirometry 06/30/09 FEV1 1.69(87%), FVC 2.17(83%), FEV1% 78 Anemia Anxiety Disorder Adenomatous Colon Polyps Hypertension Pneumonia TMJ      - Dr. Christia Reading CDiff colitis  - april 2011 Colonic polyps, hx of    Anemia-iron deficiency  Review of Systems       11 systems reviewed and negative except per HPI   Vital Signs:  Patient profile:   60 year old female Height:      65 inches (165.10 cm) Weight:      267.0 pounds (121.36 kg) BMI:     44.59 Temp:     98.2 degrees F (36.78 degrees C) oral Pulse rate:   73 / minute BP sitting:   135 / 84  (left arm)  Vitals Entered By: Baxter Hire) (Apr 28, 2010 10:27 AM) CC: new patient / c. diff Pain Assessment Patient in pain? no      Nutritional Status BMI of > 30 = obese Nutritional Status Detail appetite is okay per patient  Does patient need assistance? Functional Status Self care Ambulation Normal   Physical Exam  General:  alert, well-developed, and well-nourished.   Head:  normocephalic and atraumatic.   Eyes:  vision grossly intact and pupils equal.   Ears:  R ear normal and L ear normal.   Mouth:  good dentition.   Neck:  supple and full ROM.   Lungs:  normal respiratory effort, no intercostal retractions, and no accessory muscle use.   Heart:  normal rate and regular rhythm.   Abdomen:  soft and non-tender.   Msk:  normal ROM, no joint tenderness, and no joint swelling.   Neurologic:  alert & oriented X3 and cranial nerves II-XII intact.   Skin:  no rashes.   Psych:  Oriented X3 and memory intact for recent and remote.     Impression & Recommendations:  Problem # 1:  CLOSTRIDIUM DIFFICILE COLITIS (ICD-008.45)  She has had persistent sxs since her admission at the end of March.  Has gotten 2 courses of flagyl with no symptomatic relief.  She is having about 5 bms a day mainly at night after dinner.   Also having crampy abd pain.  She is eating yougurt, but has tried to avoid dairy products without any change.  She has tried probiotics as well.  HaS lost 18# since this began.  Taking in enough fluids.  No vomiting, occas nausea. Plan to repeat c diff and bw today.  If c diff posiitve will need vanco taper.  if  neg will repeat x1 and if still neg may need a scope or empiric trial of immodium.,  Advised to avoid dairy and try a bland highfiber diet for now.  Follow up as needed.  Orders: T-Comprehensive Metabolic Panel [id number]) T-CBC w/Diff [id number]) T-Culture, C-Diff Toxin A/B ([id number]) Consultation Level IV (16010)  Problem # 2:  LOSS OF WEIGHT (ICD-783.21) check tsh as well as above  Other Orders: T-TSH ([id number])  Patient Instructions: 1)  Call in 3 days for results of bloodwork. 2)  If your C diff is positive we will try to get your insurance to approve vancomycin taper.  If negative we will repeat x1.  If both negative we may need to get a colonoscopy or try immodium 3)  Try a very bland high fiber non diary diet for the next week.

## 2011-01-05 NOTE — Assessment & Plan Note (Signed)
Summary: transfer from [facility] to [facility]/ok'd both M.D.'s/cd   Vital Signs:  Patient profile:   60 year old female Height:      65 inches (165.10 cm) Weight:      260.0 pounds (118.18 kg) O2 Sat:      97 % on Room air Temp:     98.5 degrees F (36.94 degrees C) oral Pulse rate:   80 / minute BP sitting:   102 / 72  (left arm) Cuff size:   large  Vitals Entered By: Orlan Leavens RMA (September 14, 2010 2:00 PM)  O2 Flow:  Room air CC: New patient Is Patient Diabetic? No Pain Assessment Patient in pain? no        Primary Care Provider:  Corwin Levins MD  CC:  New patient.  History of Present Illness: new to me but known to our practice (prev followed with dr. Jonny Ruiz)  concerned about obesity and inability to lose weight. onset of obesity 1990s when dx and tx for sarcoid reports following low fat + calorie restrcited diet not improved by exercise 76min/d 7d/wk prev success tx in med wt loss clinic with b12 shots and phentermine - wants to resume same now +fh "big people" has not attended nutrition counseling or bariatric clinic meetings  also reviewed chronic med issues: 1) HTN - reports compliance with ongoing medical treatment and no changes in medication dose or frequency. denies adverse side effects related to current therapy.   2) DM2 - reports compliance with ongoing diet control and no changes in medication dose or frequency. denies adverse side effects related to current therapy. checks sugars each am  3) gerd - reports compliance with ongoing medical treatment and no changes in medication dose or frequency. denies adverse side effects related to current therapy. no abd pain or change in bowels    Preventive Screening-Counseling & Management  Alcohol-Tobacco     Alcohol drinks/day: 0     Alcohol Counseling: not indicated; patient does not drink     Smoking Status: never     Tobacco Counseling: not indicated; no tobacco use  Clinical Review Panels:  Prevention  Last Mammogram:  normal (10/09/2009)   Last Pap Smear:  normal (09/05/2009)   Last Colonoscopy:  normal (12/07/2007)  Immunizations   Last Tetanus Booster:  Tdap (12/06/2004)   Last Flu Vaccine:  Fluvax 3+ (09/14/2010)   Last Pneumovax:  Historical (03/06/2008)  Diabetes Management   Creatinine:  0.92 (04/28/2010)   Last Flu Vaccine:  Fluvax 3+ (09/14/2010)   Last Pneumovax:  Historical (03/06/2008)  CBC   WBC:  3.9 (04/28/2010)   RBC:  4.05 (04/28/2010)   Hgb:  11.7 (04/28/2010)   Hct:  36.6 (04/28/2010)   Platelets:  254 (04/28/2010)   MCV  90.4 (04/28/2010)   MCHC  32.0 (04/28/2010)   RDW  15.4 (04/28/2010)   PMN:  49 (04/28/2010)   Lymphs:  39 (04/28/2010)   Monos:  9 (04/28/2010)   Eosinophils:  4 (04/28/2010)   Basophil:  0 (04/28/2010)  Complete Metabolic Panel   Glucose:  84 (04/28/2010)   Sodium:  137 (04/28/2010)   Potassium:  4.1 (04/28/2010)   Chloride:  99 (04/28/2010)   CO2:  27 (04/28/2010)   BUN:  16 (04/28/2010)   Creatinine:  0.92 (04/28/2010)   Albumin:  3.7 (04/28/2010)   Total Protein:  6.8 (04/28/2010)   Calcium:  9.0 (04/28/2010)   Total Bili:  0.5 (04/28/2010)   Alk Phos:  81 (04/28/2010)  SGPT (ALT):  15 (04/28/2010)   SGOT (AST):  23 (04/28/2010)   Current Medications (verified): 1)  Pulmicort 0.5 Mg/98ml Susp (Budesonide) .... One Vial Nebulized Two Times A Day As Needed 2)  Proair Hfa 108 (90 Base) Mcg/act Aers (Albuterol Sulfate) .... 2 Puffs Four Times Daily As Needed 3)  Albuterol Sulfate (2.5 Mg/108ml) 0.083% Nebu (Albuterol Sulfate) .... One Vial Nebulized Up To Four Times Per Day As Needed 4)  Flonase 50 Mcg/act Susp (Fluticasone Propionate) .... Two Sprays Once Daily 5)  Vitamin D 2000 Unit Tabs (Cholecalciferol) .[name].. 1 By Mouth Daily 6)  Enablex 7.5 Mg Xr24h-Tab (Darifenacin Hydrobromide) .[name].. 1 By Mouth Daily 7)  Dexilant 60 Mg Cpdr (Dexlansoprazole) .[name].. 1 By Mouth Daily 8)  [name] Hct 40-25 Mg Tabs (Olmesartan Medoxomil-Hctz)  .[name].. 1 By Mouth Daily 9)  Bystolic 5 Mg Tabs (Nebivolol Hcl) .[name].. 1 By Mouth Daily 10)  Align   Caps (Misc Intestinal Flora Regulat) .... Take One Capsule By Mouth Daily 11)  Xanax 1 Mg Tabs (Alprazolam) .[name].. 1 By Mouth Two Times A Day As Needed  - To Fill June 2 , 2011 12)  Florastor 250 Mg Caps (Saccharomyces Boulardii) .... Take 1 Tab Daily 13)  Tramadol Hcl 50 Mg Tabs (Tramadol Hcl) .[name].. 1 - 2 By Mouth Q 6 Hrs As Needed Pain 14)  Singulair 10 Mg Tabs (Montelukast Sodium) .... Take 1 Tab By Mouth At Bedtime 15)  Folic Acid 400 Mcg Tabs (Folic Acid) .... Take 1 By Mouth Once Daily 16)  Vitamin B-12 1000 Mcg Tabs (Cyanocobalamin) .... Take 1 By Mouth Once Daily 17)  Calcium Caltrate Liquid .... Drink Once Daily  Allergies (verified): 1)  ! Asa 2)  ! * Latex  Past History:  Past medical, surgical, family and social histories (including risk factors) reviewed, and no changes noted (except as noted below).  Past Medical History: Obstructive sleep apnea      - RDI 10.5      - CPAP 9 cm H2O DM - diet controlled   Sarcoidosis      - Dx 1996 @ [facility] with Tbx. stable and in remission HTN GERD      - Dr. Karolee Ohs Goiter Asthma      - Spirometry 06/30/09 FEV1 1.69(87%), FVC 2.17(83%), FEV1% 78 Anemia Anxiety Disorder Adenomatous Colon Polyps Hypertension Pneumonia TMJ      - Dr. Christia Reading C diff colitis  - april 2011 Colonic polyps, hx of  Anemia-iron deficiency  Past Surgical History: Reviewed history from 06/30/2009 and no changes required. Vulvectomy for cancerous cells Tubal pregnancy  Family History: Reviewed history from 03/20/2010 and no changes required. Father - HTN, CAD s/p MI, Seizure Mother - MVA Sister - Kidney problem from birth, on dialysis Daughter - Asthma Son - Asthma Family History of Diabetes: Father? No FH of Colon Cancer: Uncle -Lung Cancer sister with osteoporosis  Social History: Reviewed history from 01/27/2010 and no changes  required. She is divorced, lives alone and has two kids.  Denies smoking.  Retired. 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 I have reviewed all other systems and they were negative.   Physical Exam  General:  alert and overweight-appearing.  NAD Eyes:  vision grossly intact, pupils equal, and pupils round.   Lungs:  normal respiratory effort and normal breath sounds.   Heart:  normal  rate and regular rhythm.   Abdomen:  soft, non-tender, and normal bowel sounds.   Msk:  No deformity or scoliosis noted of thoracic or lumbar spine.   Psych:  Oriented X3, memory intact for recent and remote, normally interactive, good eye contact, not anxious appearing, not depressed appearing, and not agitated.      Impression & Recommendations:  Problem # 1:  MORBID OBESITY (ICD-278.01)  long hx same labs to screen for assoc med dz now known complicating features include osa and ddd/oa as well as dm discussed lack of evidence for benefit of b12 shots - same explained to pt who understands and agrees but wishes to have shot given relative low risk vs percieved benefit, proceed with shot- pt understands this tx not covered by med insurance will refer to nutrition counseling and given informztion on bariatic classes to consider surg options also resume phentermine on 3-6 mo/yr basis so long as monthlyweigh in show weight loss - rx done Time spent with patient 40 minutes, more than 50% of this time was spent counseling patient on obesity, assoc med issues due to obesity and options for mgmt of weight loss including referrals Orders: TLB-Lipid Panel (80061-LIPID) TLB-TSH (Thyroid Stimulating Hormone) (84443-TSH) Vit B12 1000 mcg (J3420) Admin of Therapeutic Inj  intramuscular or subcutaneous (60454)  Ht: 65 (09/14/2010)   Wt: 260.0 (09/14/2010)   BMI: 43.82 (08/12/2010)  Problem # 2:  DM (ICD-250.00)  diet  controlled per report check labs now Her updated medication list for this problem includes:    [name] Hct 40-25 Mg Tabs (Olmesartan medoxomil-hctz) .[name]... 1 by mouth daily  Orders: TLB-A1C / Hgb A1C (Glycohemoglobin) (83036-A1C)  Labs Reviewed: Creat: 0.92 (04/28/2010)     Problem # 3:  HYPERTENSION (ICD-401.9)  Her updated medication list for this problem includes:    [name] Hct 40-25 Mg Tabs (Olmesartan medoxomil-hctz) .[name]... 1 by mouth daily    Bystolic 5 Mg Tabs (Nebivolol hcl) .[name]... 1 by mouth daily  BP today: 102/72 Prior BP: 112/70 (08/12/2010)  Labs Reviewed: K+: 4.1 (04/28/2010) Creat: : 0.92 (04/28/2010)     Problem # 4:  GERD (ICD-530.81)  Her updated medication list for this problem includes:    Dexilant 60 Mg Cpdr (Dexlansoprazole) .[name]... 1 by mouth daily  Continue standard antireflux measures and Dexilant.  Labs Reviewed: Hgb: 11.7 (04/28/2010)   Hct: 36.6 (04/28/2010)  Complete Medication List: 1)  Pulmicort 0.5 Mg/8ml Susp (Budesonide) .... One vial nebulized two times a day as needed 2)  Proair Hfa 108 (90 Base) Mcg/act Aers (Albuterol sulfate) .... 2 puffs four times daily as needed 3)  Albuterol Sulfate (2.5 Mg/87ml) 0.083% Nebu (Albuterol sulfate) .... One vial nebulized up to four times per day as needed 4)  Flonase 50 Mcg/act Susp (Fluticasone propionate) .... Two sprays once daily 5)  Vitamin D 2000 Unit Tabs (Cholecalciferol) .[name].. 1 by mouth daily 6)  Enablex 7.5 Mg Xr24h-tab (Darifenacin hydrobromide) .[name].. 1 by mouth daily 7)  Dexilant 60 Mg Cpdr (Dexlansoprazole) .[name].. 1 by mouth daily 8)  [name] Hct 40-25 Mg Tabs (Olmesartan medoxomil-hctz) .[name].. 1 by mouth daily 9)  Bystolic 5 Mg Tabs (Nebivolol hcl) .[name].. 1 by mouth daily 10)  Align Caps (Misc intestinal flora regulat) .... Take one capsule by mouth daily 11)  Xanax 1 Mg Tabs (Alprazolam) .[name].. 1 by mouth two times a day as needed  - to fill june 2 , 2011 12)  Florastor 250 Mg Caps (Saccharomyces  boulardii) .... Take 1 tab  daily 13)  Tramadol Hcl 50 Mg Tabs (Tramadol hcl) .[name].. 1 - 2 by mouth q 6 hrs as needed pain 14)  Singulair 10 Mg Tabs (Montelukast sodium) .... Take 1 tab by mouth at bedtime 15)  Folic Acid 400 Mcg Tabs (Folic acid) .... Take 1 by mouth once daily 16)  Vitamin B-12 1000 Mcg Tabs (Cyanocobalamin) .... Take 1 by mouth once daily 17)  Calcium Caltrate Liquid  .... Drink once daily 18)  Phentermine Hcl 37.5 Mg Caps (Phentermine hcl) .[name].. 1 by mouth once daily  Other Orders: Flu Vaccine 73yrs + MEDICARE PATIENTS (U9323) Administration Flu vaccine - MCR (F5732)  Patient Instructions: 1)  it was good to see you today. 2)  your medications and medical history has been reviewed today 3)  test(s) ordered today - your results will be posted on the phone tree for review in 48-72 hours from the time of test completion; call [phone] and enter your 9 digit MRN (listed above on this page, just below your name); if any changes need to be made or there are abnormal results, you will be contacted directly. 4)  start phentermine and B12 shots monthly as discussed - the B12 shots will be billed to you as these are not covered for weight loss or obesity 5)  we'll make referral to nutrtionist to review calorie intake for weight loss. Our office will contact you regarding this appointment once made.  6)  Call [phone] and ask to enroll for the bariatric orientation classes to learn about surgical weight loss options 7)  Please schedule a follow-up appointment in 30days for weigh in and medication review, call sooner if problems.  Flu Vaccine Consent Questions     Do you have a history of severe allergic reactions to this vaccine? no    Any prior history of allergic reactions to egg and/or gelatin? no    Do you have a sensitivity to the preservative Thimersol? no    Do you have a past history of Guillan-Barre Syndrome? no    Do you currently have an acute febrile illness? no    Have  you ever had a severe reaction to latex? no    Vaccine information given and explained to patient? yes    Are you currently pregnant? no    Lot Number:[id number]   Exp Date:06/05/2011   Site Given  Left Deltoid IMPrescriptions: PHENTERMINE HCL 37.5 MG CAPS (PHENTERMINE HCL) 1 by mouth once daily  #30 x 0   Entered and Authorized by:   Newt Lukes MD   Signed by:   Newt Lukes MD on 09/14/2010   Method used:   Print then Give to Patient   RxID:   [id number]    .[email or web address]   Medication Administration  Injection # 1:    Medication: Vit B12 1000 mcg    Diagnosis: MORBID OBESITY (ICD-278.01)    Route: IM    Site: R deltoid    Exp Date: 06/2012    Lot #: [id number]    Mfr: [employer]    Patient tolerated injection without complications    Given by: Orlan Leavens RMA (September 14, 2010 3:01 PM)  Orders Added: 1)  Flu Vaccine 70yrs + MEDICARE PATIENTS [Q2039] 2)  Administration Flu vaccine - MCR [G0008] 3)  TLB-Lipid Panel [80061-LIPID] 4)  TLB-TSH (Thyroid Stimulating Hormone) [84443-TSH] 5)  TLB-A1C / Hgb A1C (Glycohemoglobin) [83036-A1C] 6)  Vit B12 1000 mcg [J3420] 7)  Admin of Therapeutic Inj  intramuscular or  subcutaneous [96372] 8)  Est. Patient Level V [29562]

## 2011-01-05 NOTE — Letter (Signed)
Summary: CMN for [facility]  CMN for [facility]   Imported By: Sherian Rein 02/27/2010 11:59:47  _____________________________________________________________________  External Attachment:    Type:   Image     Comment:   External Document

## 2011-01-05 NOTE — Assessment & Plan Note (Signed)
Summary: rov 3 months///kp   Visit Type:  Follow-up Copy to:  [facility] Primary Provider/Referring Provider:  Corwin Levins MD  CC:  Asthma.  Sarcoid.  OSA.  The patient says she is doing well on CPAP and has not noticed any changes in her breathing.[name]  History of Present Illness: 60 yo female in follow up for Asthma, Sarcoidosis, OSA, Rhinitis, and Hoarseness  She was hospitalized for possible pneumoni in March.  At that time she had fever of 102F, and dry cough.  She was treated with antibiotics, and improved.   Her cough has improved also.  She is not having as much hoarseness since she was switched to nebulized medication.  Her breathing has been okay, but she will still occasionally get winded with strenuous exertion.    She has been having problems with diarrhea.  As a result she has lost 24 lbs.  She has been on therapy for possible C. diff, and is due to see ID.  She is not having sputum, wheeze, or sinus congestion.  She uses her proair once or twice per week, and this helps.  She is using her CPAP nightly, and has no problem with it.  Current Medications (verified): 1)  Singulair 10 Mg Tabs (Montelukast Sodium) .... One By Mouth At Bedtime 2)  Nasonex 50 Mcg/act Susp (Mometasone Furoate) .... Two Sprays Once Daily 3)  Proair Hfa 108 (90 Base) Mcg/act Aers (Albuterol Sulfate) .... 2 Puffs Four Times Daily As Needed 4)  Vitamin D 2000 Unit Tabs (Cholecalciferol) .[name].. 1 By Mouth Daily 5)  Enablex 7.5 Mg Xr24h-Tab (Darifenacin Hydrobromide) .[name].. 1 By Mouth Daily 6)  Dexilant 60 Mg Cpdr (Dexlansoprazole) .[name].. 1 By Mouth Daily 7)  [name] Hct 40-25 Mg Tabs (Olmesartan Medoxomil-Hctz) .[name].. 1 By Mouth Daily 8)  Bystolic 5 Mg Tabs (Nebivolol Hcl) .[name].. 1 By Mouth Daily 9)  Reglan 5 Mg Tabs (Metoclopramide Hcl) .... Take One By Mouth Three Times A Day As Needed 10)  Align   Caps (Misc Intestinal Flora Regulat) .... Take One Capsule By Mouth Daily 11)  Xanax 1 Mg Tabs (Alprazolam)  .... 2 By Mouth Daily As Needed 12)  Albuterol Sulfate (2.5 Mg/72ml) 0.083% Nebu (Albuterol Sulfate) .... One Vial Nebulized Up To Four Times Per Day As Needed 13)  Pulmicort 0.5 Mg/46ml Susp (Budesonide) .... One Vial Nebulized Two Times A Day 14)  Questran Light 4 Gm/dose Powd (Cholestyramine Light) .... Take 4 Grams in 8 Ox of Juice, Can Not Take Within 2 Hours of Any Other Medication. Take 1-2 Times Daily 15)  Bentyl 10 Mg Caps (Dicyclomine Hcl) .... Take 1 Tab 1-2 Times Daily For Cramping and Spasms 16)  Florastor 250 Mg Caps (Saccharomyces Boulardii) .... Take 1 Tab Daily 17)  Tramadol Hcl 50 Mg Tabs (Tramadol Hcl) .[name].. 1 - 2 By Mouth Q 6 Hrs As Needed Pain 18)  Shower Chair .... Use Asd 19)  Metronidazole 250 Mg Tabs (Metronidazole) .[name].. 1 By Mouth Three Times A Day For 10 Days 20)  Fluconazole 100 Mg Tabs (Fluconazole) .[name].. 1 By Mouth May Repeat in 3 Days If Symptoms Don't Resolve  Allergies (verified): 1)  ! Asa 2)  ! * Latex  Past History:  Past Medical History: Obstructive sleep apnea      - RDI 10.5      - CPAP 9 cm H2O DM Sarcoidosis      - Dx 1996 @ [facility] with Tbx HTN GERD      -  Dr. Karolee Ohs Goiter Asthma      - Spirometry 06/30/09 FEV1 1.69(87%), FVC 2.17(83%), FEV1% 78 Anemia Anxiety Disorder Adenomatous Colon Polyps Hypertension Pneumonia TMJ      - Dr. Christia Reading CDiff colitis  - april 2011 Colonic polyps, hx of  Anemia-iron deficiency  Past Surgical History: Reviewed history from 06/30/2009 and no changes required. Vulvectomy for cancerous cells Tubal pregnancy  CXR  Procedure date:  02/26/2010  Findings:       CHEST - 2 VIEW 02/26/2010:    Comparison: None.    Findings: Cardiomediastinal silhouette unremarkable.  Patchy   airspace opacities throughout both upper lobes.  Relative sparing   of the lower lobes and right middle lobe.  No pleural effusions.   Mild degenerative changes in the thoracic spine.    IMPRESSION:   Bilateral  upper lobe pneumonia.  CT of Chest  Procedure date:  03/01/2010  Findings:       Findings: Negative abnormal mediastinal adenopathy.    No pneumothorax or pleural effusion.    Bilateral upper lobe interstitial lung disease is present   characterized by reticular markings, interlobular septal   thickening, some disorganization of the architecture towards the   apices, and some fine nodules peripherally.  No evidence of cystic   lung disease.  No definite consolidation.    IMPRESSION:   Bilateral predominately upper lobe interstitial lung disease as   described.  Differential diagnosis includes eosinophilic lung   disease, sarcoidosis, silicosis, fungal or TB infection.  Also   consider bronchiolitis obliterans pneumonia.  CXR  Procedure date:  03/04/2010  Findings:       Findings: Stable normal sized heart.  No significant change in mild   ill-defined opacity in both upper lobes.  Minimal right pleural   fluid.  Unremarkable bones.    IMPRESSION:    1.  No significant change in mild bilateral upper lobe pneumonia.   2.  Minimal right pleural fluid.    Vital Signs:  Patient profile:   60 year old female Height:      65 inches (165.10 cm) Weight:      266 pounds (120.91 kg) BMI:     44.42 O2 Sat:      95 % on Room air Temp:     98.5 degrees F (36.94 degrees C) oral Pulse rate:   96 / minute BP sitting:   106 / 62  (right arm) Cuff size:   large  Vitals Entered By: Michel Bickers CMA (Apr 09, 2010 1:29 PM)  O2 Sat at Rest %:  95 O2 Flow:  Room air  Physical Exam  General:  normal appearance, healthy appearing, and obese.   Nose:  no sinus tenderness, no discharge Mouth:  mild erythema, no exudate Neck:  no JVD.   Lungs:  bronchial breath sounds upper lobes, no wheeze Heart:  regular rhythm, normal rate, and no murmurs.   Extremities:  no clubbing, cyanosis, edema, or deformity noted Cervical Nodes:  no significant adenopathy   Impression &  Recommendations:  Problem # 1:  ASTHMA (ICD-493.90) This is stable.  She is to continue singulair and as needed proair.  Will have her use pulmicort as needed when she has exacerbations.  Problem # 2:  SARCOIDOSIS (ICD-135) She was recently hospitalized for pneumonia.  Will repeat her chest xray today.  Problem # 3:  OBSTRUCTIVE SLEEP APNEA (ICD-327.23)  She should be on pressure of 9 cm H2O.  She does not think this  is the pressure her machine is set at.  Will get a repeat CPAP download.  Problem # 4:  ALLERGIC RHINITIS (ICD-477.9)  She is to continue flonase.  Problem # 5:  HOARSENESS (XBJ-478.29)  This has improved.  Will continue clinical observation.  Likely related to upper airway irritation from post-nasal drip, GERD, and inhaler therapy.  Problem # 6:  DIARRHEA OF PRESUMED INFECTIOUS ORIGIN (ICD-009.3) She is to follow up with GI and ID.  Medications Added to Medication List This Visit: 1)  Pulmicort 0.5 Mg/57ml Susp (Budesonide) .... One vial nebulized two times a day as needed 2)  Flonase 50 Mcg/act Susp (Fluticasone propionate) .... Two sprays once daily 3)  Xanax 1 Mg Tabs (Alprazolam) .... 2 by mouth daily as needed  Complete Medication List: 1)  Pulmicort 0.5 Mg/29ml Susp (Budesonide) .... One vial nebulized two times a day as needed 2)  Singulair 10 Mg Tabs (Montelukast sodium) .... One by mouth at bedtime 3)  Proair Hfa 108 (90 Base) Mcg/act Aers (Albuterol sulfate) .... 2 puffs four times daily as needed 4)  Flonase 50 Mcg/act Susp (Fluticasone propionate) .... Two sprays once daily 5)  Vitamin D 2000 Unit Tabs (Cholecalciferol) .[name].. 1 by mouth daily 6)  Enablex 7.5 Mg Xr24h-tab (Darifenacin hydrobromide) .[name].. 1 by mouth daily 7)  Dexilant 60 Mg Cpdr (Dexlansoprazole) .[name].. 1 by mouth daily 8)  [name] Hct 40-25 Mg Tabs (Olmesartan medoxomil-hctz) .[name].. 1 by mouth daily 9)  Bystolic 5 Mg Tabs (Nebivolol hcl) .[name].. 1 by mouth daily 10)  Reglan 5 Mg Tabs  (Metoclopramide hcl) .... Take one by mouth three times a day as needed 11)  Align Caps (Misc intestinal flora regulat) .... Take one capsule by mouth daily 12)  Xanax 1 Mg Tabs (Alprazolam) .... 2 by mouth daily as needed 13)  Albuterol Sulfate (2.5 Mg/47ml) 0.083% Nebu (Albuterol sulfate) .... One vial nebulized up to four times per day as needed 14)  Questran Light 4 Gm/dose Powd (Cholestyramine light) .... Take 4 grams in 8 ox of juice, can not take within 2 hours of any other medication. take 1-2 times daily 15)  Bentyl 10 Mg Caps (Dicyclomine hcl) .... Take 1 tab 1-2 times daily for cramping and spasms 16)  Florastor 250 Mg Caps (Saccharomyces boulardii) .... Take 1 tab daily 17)  Tramadol Hcl 50 Mg Tabs (Tramadol hcl) .[name].. 1 - 2 by mouth q 6 hrs as needed pain 18)  Shower Chair  .... Use asd 19)  Fluconazole 100 Mg Tabs (Fluconazole) .[name].. 1 by mouth may repeat in 3 days if symptoms don't resolve  Other Orders: Est. Patient Level III (56213) Prescription Created Electronically [phone]) DME Referral (DME) T-2 View CXR (71020TC)  Patient Instructions: 1)  Singulair 10 mg at bedtime 2)  Proair two puffs up to four times per day as needed  3)  Flonase two sprays once daily  4)  Chest xray today 5)  Will get CPAP report 6)  Follow up in 4 months Prescriptions: FLONASE 50 MCG/ACT SUSP (FLUTICASONE PROPIONATE) two sprays once daily  #1 x 6   Entered and Authorized by:   Coralyn Helling MD   Signed by:   Coralyn Helling MD on 04/09/2010   Method used:   Electronically to        [facility]  [address] [id number] (retail)       [address]       [facility] [place], [place]  [address]       Ph: [phone] or [phone]       Fax: [phone]   RxID:   [id number]

## 2011-01-05 NOTE — Progress Notes (Signed)
----   Converted from flag ---- ---- 03/20/2010 1:17 PM, Corwin Levins MD wrote: ok for shower chair  ---- 03/20/2010 10:48 AM, Zella Ball Ewing wrote: Patient requested a prescription for a bath seat. She stated she will call to give fax number where to be sent if ok. ------------------------------  called pt to inform prescription ready for shower chair. Faxed to [facility] at [phone].

## 2011-01-05 NOTE — Assessment & Plan Note (Signed)
Summary: rov ///kp   Copy to:  [facility] Primary Provider/Referring Provider:  Fleet Contras, MD  CC:  Asthma.  Sarcoid.  The patient c/o sob with exertion and dry cough and hoarseness/sore throat and a runny nose.  History of Present Illness: 60 yo female in follow up for Asthma, Sarcoidosis, OSA, Rhinitis, and Hoarseness  She has still been having trouble with hoarseness.  Her sinuses are okay, but she still has post-nasal drip.  She is being treated for reflux, and this has improved.  She was having some wheeze in December, but has improved since then.  She still has some cough, but is not bringing up sputum.  She has not had fever.  She has not need to use her proair for some time.  She is still in the process of being set up for a mouth guard for her TMJ.  She continues to have jaw discomfort.  She is still having trouble with her CPAP set up.  She feels the mask is okay.  She is not sure she is getting enough pressure.  I have not received her CPAP download yet.  She is scheduled to have surgery with Dr. [facility] later this month.   Current Medications (verified): 1)  Symbicort 160-4.5 Mcg/act Aero (Budesonide-Formoterol Fumarate) .... One Puff Two Times A Day 2)  Singulair 10 Mg Tabs (Montelukast Sodium) .... One By Mouth At Bedtime 3)  Nasonex 50 Mcg/act Susp (Mometasone Furoate) .... Two Sprays Once Daily 4)  Proair Hfa 108 (90 Base) Mcg/act Aers (Albuterol Sulfate) .... 2 Puffs Four Times Daily As Needed 5)  Vitamin D 2000 Unit Tabs (Cholecalciferol) .[name].. 1 By Mouth Daily 6)  Enablex 7.5 Mg Xr24h-Tab (Darifenacin Hydrobromide) .[name].. 1 By Mouth Daily 7)  Dexilant 60 Mg Cpdr (Dexlansoprazole) .[name].. 1 By Mouth Daily 8)  [name] Hct 40-25 Mg Tabs (Olmesartan Medoxomil-Hctz) .[name].. 1 By Mouth Daily 9)  Phentermine Hcl 37.5 Mg Tabs (Phentermine Hcl) .[name].. 1 By Mouth Daily 10)  Bystolic 5 Mg Tabs (Nebivolol Hcl) .[name].. 1 By Mouth Daily 11)  Reglan 5 Mg Tabs (Metoclopramide Hcl)  .... Take One By Mouth Three Times A Day As Needed 12)  Align   Caps (Misc Intestinal Flora Regulat) .... Take One Capsule By Mouth Daily 13)  Xanax 1 Mg Tabs (Alprazolam) .... As Needed  Allergies (verified): 1)  ! Asa 2)  ! * Latex  Past History:  Past Medical History: Obstructive sleep apnea      - RDI 10.5      - CPAP 15 cm H2O DM Sarcoidosis      - Dx 1996 @ [facility] with Tbx HTN GERD      - Dr. Karolee Ohs Goiter Asthma      - Spirometry 06/30/09 FEV1 1.69(87%), FVC 2.17(83%), FEV1% 78 Anemia Anxiety Disorder Adenomatous Colon Polyps Hypertension Pneumonia TMJ      - Dr. Christia Reading  Past Surgical History: Reviewed history from 06/30/2009 and no changes required. Vulvectomy for cancerous cells Tubal pregnancy  Family History: Reviewed history from 08/22/2009 and no changes required. Father - HTN, CAD s/p MI, Seizure Mother - MVA Sister - Kidney problem from birth, on dialysis Daughter - Asthma Son - Asthma Family History of Diabetes: Father?  Social History: Reviewed history from 08/22/2009 and no changes required. She is divorced, and has two kids.  Denies smoking.  Retired.  Worked as a Scientist, research (life sciences). Alcohol Use - no Illicit Drug Use - no  Vital Signs:  Patient profile:   60 year old female Height:      65 inches (165.10 cm) Weight:      279.13 pounds (126.88 kg) BMI:     46.62 O2 Sat:      98 % on Room air Temp:     98.1 degrees F (36.72 degrees C) oral Pulse rate:   91 / minute BP sitting:   120 / 76  (left arm) Cuff size:   large  Vitals Entered By: Michel Bickers CMA (January 09, 2010 10:09 AM)  O2 Sat at Rest %:  98 O2 Flow:  Room air  Physical Exam  General:  normal appearance, healthy appearing, and obese.   Nose:  prominent turbinates, clear drainage, boggy mucosa Mouth:  MP 3, 2+ tonsills, no exudate Neck:  no JVD, mild thyroid enlargement Lungs:  Decreased breath sounds, no wheezing or rales Heart:  regular  rate and rhythm, S1, S2 without murmurs, rubs, gallops, or clicks Abdomen:  bowel sounds positive; abdomen soft and non-tender without masses, or organomegaly Extremities:  no clubbing, cyanosis, edema, or deformity noted Cervical Nodes:  no significant adenopathy   Impression & Recommendations:  Problem # 1:  ASTHMA (ICD-493.90) Will change her from symbicort to nebulized pulmicort.  She is to continue with singulair and as needed albuterol.  Problem # 2:  SARCOIDOSIS (ICD-135) This does not appear to be an active issue.  Problem # 3:  OBSTRUCTIVE SLEEP APNEA (ICD-327.23) Will get a copy of her CPAP download, and determine if adjustments need to be made.  Problem # 4:  ALLERGIC RHINITIS (ICD-477.9) She is to continue on nasal irrigation, and nasonex.  Problem # 5:  HOARSENESS (ZOX-096.04) This is likely from her rhinitis and post-nasal drip, reflux, and possibly irritation from inhaler therapy.  She has been seen by ENT, and GI.  She is to continue her sinus regimen, and reflux therapy.  I will change her from inhalers to nebulized medications for her asthma to see if this improves her upper airway symptoms.  Problem # 6:  GERD (ICD-530.81) She is on dexiland per GI.  Problem # 7:  PRE-OPERATIVE RESPIRATORY EXAMINATION (ICD-V72.82) She is schedule to undergo Gyn surgery.  I don't think there are any pulmonary contra-indications for her to have surgery.  She should continue on her current asthma regimen during the peri-operative period.  She should also continue on CPAP therapy when asleep during the peri-operative period.  She should have close monitoring of her oxygen saturation post-operatively.  The anesthesia team should be made aware of her history of asthma and sleep apnea, so appropriate precautions can be taken.  Pulmonary service can be available if needed if she develops respiratory problems after surgery.  Medications Added to Medication List This Visit: 1)  Xanax 1 Mg  Tabs (Alprazolam) .... As needed 2)  Albuterol Sulfate (2.5 Mg/49ml) 0.083% Nebu (Albuterol sulfate) .... One vial nebulized up to four times per day as needed 3)  Pulmicort 0.5 Mg/32ml Susp (Budesonide) .... One vial nebulized two times a day  Complete Medication List: 1)  Singulair 10 Mg Tabs (Montelukast sodium) .... One by mouth at bedtime 2)  Nasonex 50 Mcg/act Susp (Mometasone furoate) .... Two sprays once daily 3)  Proair Hfa 108 (90 Base) Mcg/act Aers (Albuterol sulfate) .... 2 puffs four times daily as needed 4)  Vitamin D 2000 Unit Tabs (Cholecalciferol) .[name].. 1 by mouth daily 5)  Enablex 7.5 Mg Xr24h-tab (Darifenacin hydrobromide) .[name].. 1 by mouth daily 6)  Dexilant 60 Mg Cpdr (Dexlansoprazole) .[name].. 1 by mouth daily 7)  [name] Hct 40-25 Mg Tabs (Olmesartan medoxomil-hctz) .[name].. 1 by mouth daily 8)  Phentermine Hcl 37.5 Mg Tabs (Phentermine hcl) .[name].. 1 by mouth daily 9)  Bystolic 5 Mg Tabs (Nebivolol hcl) .[name].. 1 by mouth daily 10)  Reglan 5 Mg Tabs (Metoclopramide hcl) .... Take one by mouth three times a day as needed 11)  Align Caps (Misc intestinal flora regulat) .... Take one capsule by mouth daily 12)  Xanax 1 Mg Tabs (Alprazolam) .... As needed 13)  Albuterol Sulfate (2.5 Mg/30ml) 0.083% Nebu (Albuterol sulfate) .... One vial nebulized up to four times per day as needed 14)  Pulmicort 0.5 Mg/61ml Susp (Budesonide) .... One vial nebulized two times a day  Other Orders: Est. Patient Level III (47425) DME Referral (DME)  Patient Instructions: 1)  Stop symbicort 2)  Will get home nebulizer machine 3)  Pulmicort nebulized two times a day 4)  Albuterol nebulized up to four times per day as needed 5)  Proair two puffs as needed for cough, congestion, wheezing, or shortness of breath 6)  Continue nasal irrigation and nasonex 7)  Will get copy of CPAP report 8)  Salt water gargle as needed 9)  Use sugarless candy to keep mouth moist 10)  Follow up in 3  months Prescriptions: PULMICORT 0.5 MG/2ML SUSP (BUDESONIDE) one vial nebulized two times a day  #60 x 6   Entered and Authorized by:   Coralyn Helling MD   Signed by:   Coralyn Helling MD on 01/09/2010   Method used:   Print then Give to Patient   RxID:   [id number] ALBUTEROL SULFATE (2.5 MG/3ML) 0.083% NEBU (ALBUTEROL SULFATE) one vial nebulized up to four times per day as needed  #120 x 6   Entered and Authorized by:   Coralyn Helling MD   Signed by:   Coralyn Helling MD on 01/09/2010   Method used:   Print then Give to Patient   RxID:   [id number]

## 2011-01-05 NOTE — Assessment & Plan Note (Signed)
Summary: 4 WK F/U.Marland KitchenMarland KitchenAS.   History of Present Illness Visit Type: Follow-up Visit Primary GI MD: Elie Goody MD [facility] Primary Provider: Haskel Schroeder, MD Chief Complaint: 4 week follow-up History of Present Illness:   This is a 60 year old female, who has had good control of her abdominal pain and reflux symptoms on Dexilant, Reglan as needed and Align. A recent gastric emptying scan had normal solid-phase emptying at 2 hours.    GI Review of Systems      Denies abdominal pain, acid reflux, belching, bloating, chest pain, dysphagia with liquids, dysphagia with solids, heartburn, loss of appetite, nausea, vomiting, vomiting blood, weight loss, and  weight gain.        Denies anal fissure, black tarry stools, change in bowel habit, constipation, diarrhea, diverticulosis, fecal incontinence, heme positive stool, hemorrhoids, irritable bowel syndrome, jaundice, light color stool, liver problems, rectal bleeding, and  rectal pain.   Current Medications (verified): 1)  Singulair 10 Mg Tabs (Montelukast Sodium) .... One By Mouth At Bedtime 2)  Nasonex 50 Mcg/act Susp (Mometasone Furoate) .... Two Sprays Once Daily 3)  Proair Hfa 108 (90 Base) Mcg/act Aers (Albuterol Sulfate) .... 2 Puffs Four Times Daily As Needed 4)  Vitamin D 2000 Unit Tabs (Cholecalciferol) .[name].. 1 By Mouth Daily 5)  Enablex 7.5 Mg Xr24h-Tab (Darifenacin Hydrobromide) .[name].. 1 By Mouth Daily 6)  Dexilant 60 Mg Cpdr (Dexlansoprazole) .[name].. 1 By Mouth Daily 7)  [name] Hct 40-25 Mg Tabs (Olmesartan Medoxomil-Hctz) .[name].. 1 By Mouth Daily 8)  Phentermine Hcl 37.5 Mg Tabs (Phentermine Hcl) .[name].. 1 By Mouth Daily 9)  Bystolic 5 Mg Tabs (Nebivolol Hcl) .[name].. 1 By Mouth Daily 10)  Reglan 5 Mg Tabs (Metoclopramide Hcl) .... Take One By Mouth Three Times A Day As Needed 11)  Align   Caps (Misc Intestinal Flora Regulat) .... Take One Capsule By Mouth Daily 12)  Xanax 1 Mg Tabs (Alprazolam) .... As Needed 13)  Albuterol Sulfate (2.5  Mg/53ml) 0.083% Nebu (Albuterol Sulfate) .... One Vial Nebulized Up To Four Times Per Day As Needed 14)  Pulmicort 0.5 Mg/91ml Susp (Budesonide) .... One Vial Nebulized Two Times A Day  Allergies (verified): 1)  ! Asa 2)  ! * Latex  Past History:  Past Medical History: Reviewed history from 01/09/2010 and no changes required. Obstructive sleep apnea      - RDI 10.5      - CPAP 15 cm H2O DM Sarcoidosis      - Dx 1996 @ [facility] with Tbx HTN GERD      - Dr. Karolee Ohs Goiter Asthma      - Spirometry 06/30/09 FEV1 1.69(87%), FVC 2.17(83%), FEV1% 78 Anemia Anxiety Disorder Adenomatous Colon Polyps Hypertension Pneumonia TMJ      - Dr. Christia Reading  Past Surgical History: Reviewed history from 06/30/2009 and no changes required. Vulvectomy for cancerous cells Tubal pregnancy  Family History: Reviewed history from 08/22/2009 and no changes required. Father - HTN, CAD s/p MI, Seizure Mother - MVA Sister - Kidney problem from birth, on dialysis Daughter - Asthma Son - Asthma Family History of Diabetes: Father? No FH of Colon Cancer: Uncle -Lung Cancer  Social History: Reviewed history from 08/22/2009 and no changes required. She is divorced, and has two kids.   Denies smoking.   Retired. 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         The pertinent positives and negatives are noted as above and in the HPI. All other ROS were reviewed and were negative.   Vital Signs:  Patient profile:   60 year old female Height:      65 inches Weight:      271.38 pounds BMI:     45.32 Pulse rate:   84 / minute Pulse rhythm:   regular BP sitting:   86 / 68  (right arm)  Vitals Entered By: Milford Cage NCMA (January 27, 2010 11:32 AM)  Physical Exam  General:  Well developed, well nourished, no acute distress. obese.   Head:  Normocephalic and  atraumatic. Eyes:  PERRLA, no icterus. Ears:  Normal auditory acuity. Mouth:  No deformity or lesions, dentition normal. Lungs:  Clear throughout to auscultation. Heart:  Regular rate and rhythm; no murmurs, rubs,  or bruits. Abdomen:  Soft, nontender and nondistended. No masses, hepatosplenomegaly or hernias noted. Normal bowel sounds. Psych:  Alert and cooperative. Normal mood and affect.  Impression & Recommendations:  Problem # 1:  ABDOMINAL PAIN OTHER SPECIFIED SITE (ICD-789.09) Abdominal pain, resolved. Continue treatment for GERD. Minimize the use of Reglan and discontinue if possible. Continue Align as needed.  Problem # 2:  GERD (ICD-530.81) Continue standard antireflux measures and Dexilant.  Patient Instructions: 1)  Copy sent to : Haskel Schroeder, MD 2)  Avoid foods high in acid content ( tomatoes, citrus juices, spicy foods) . Avoid eating within 3 to 4 hours of lying down or before exercising. Do not over eat; try smaller more frequent meals. Elevate head of bed four inches when sleeping.  3)  The medication list was reviewed and reconciled.  All changed / newly prescribed medications were explained.  A complete medication list was provided to the patient / caregiver.

## 2011-01-05 NOTE — Progress Notes (Signed)
Summary: hfu w/ vs  Phone Note From Other Clinic   Caller: dr Leilani Able Call For: sood Summary of Call: per dr reese's requests i have scheduled a hfu w/ dr Craige Cotta for 4/13. the first avail for 30 min slot was in may so i put her in an rov timeslot. since dr sood sees this pt already i hope this will be ok. if this needs to be changed please advise. thanks.  Initial call taken by: Tivis Ringer, CNA,  March 05, 2010 4:00 PM  Follow-up for Phone Call        Will forward to lori, pls advise if this will be ok.  Thanks! Gweneth Dimitri RN  March 05, 2010 4:10 PM   Thia appt is okay.Michel Bickers [facility]  March 05, 2010 4:44 PM

## 2011-01-05 NOTE — Medication Information (Signed)
Summary: Vancomycin DENIED/[employer]  Vancomycin DENIED/[employer]   Imported By: Sherian Rein 04/06/2010 13:46:58  _____________________________________________________________________  External Attachment:    Type:   Image     Comment:   External Document

## 2011-01-05 NOTE — Assessment & Plan Note (Signed)
Summary: f/u ///kp   Visit Type:  Follow-up Copy to:  [facility] Primary Provider/Referring Provider:  Corwin Levins MD  CC:  Asthma...sarcoid...osa...patient says she is doing well on cpap...she does c/o sob with exertion and has noticed her heartrate is elevated when moving.  History of Present Illness: 60 yo female in follow up for Asthma, Sarcoidosis, OSA, Rhinitis, and Hoarseness  She is not having as much diarrhea as before.  Her breathing has been okay.  She is doing well with CPAP.  She has been working out on a regular basis, and her heart rate gets up to 105.  She denies palpitations.  She has not had fever, cough, sputum, wheeze, or skin rash.  Current Medications (verified): 1)  Pulmicort 0.5 Mg/16ml Susp (Budesonide) .... One Vial Nebulized Two Times A Day As Needed 2)  Singulair 10 Mg Tabs (Montelukast Sodium) .... One By Mouth At Bedtime 3)  Proair Hfa 108 (90 Base) Mcg/act Aers (Albuterol Sulfate) .... 2 Puffs Four Times Daily As Needed 4)  Flonase 50 Mcg/act Susp (Fluticasone Propionate) .... Two Sprays Once Daily 5)  Vitamin D 2000 Unit Tabs (Cholecalciferol) .[name].. 1 By Mouth Daily 6)  Enablex 7.5 Mg Xr24h-Tab (Darifenacin Hydrobromide) .[name].. 1 By Mouth Daily 7)  Dexilant 60 Mg Cpdr (Dexlansoprazole) .[name].. 1 By Mouth Daily 8)  [name] Hct 40-25 Mg Tabs (Olmesartan Medoxomil-Hctz) .[name].. 1 By Mouth Daily 9)  Bystolic 5 Mg Tabs (Nebivolol Hcl) .[name].. 1 By Mouth Daily 10)  Reglan 5 Mg Tabs (Metoclopramide Hcl) .... Take One By Mouth Three Times A Day As Needed 11)  Align   Caps (Misc Intestinal Flora Regulat) .... Take One Capsule By Mouth Daily 12)  Xanax 1 Mg Tabs (Alprazolam) .[name].. 1 By Mouth Two Times A Day As Needed  - To Fill June 2 , 2011 13)  Albuterol Sulfate (2.5 Mg/56ml) 0.083% Nebu (Albuterol Sulfate) .... One Vial Nebulized Up To Four Times Per Day As Needed 14)  Florastor 250 Mg Caps (Saccharomyces Boulardii) .... Take 1 Tab Daily 15)  Tramadol Hcl 50 Mg Tabs  (Tramadol Hcl) .[name].. 1 - 2 By Mouth Q 6 Hrs As Needed Pain 16)  Shower Chair .... Use Asd  Allergies (verified): 1)  ! Asa 2)  ! * Latex  Past History:  Past Medical History: Obstructive sleep apnea      - RDI 10.5      - CPAP 9 cm H2O DM - diet controlled Sarcoidosis      - Dx 1996 @ [facility] with Tbx. stable and in remission HTN GERD      - Dr. Karolee Ohs Goiter Asthma      - Spirometry 06/30/09 FEV1 1.69(87%), FVC 2.17(83%), FEV1% 78 Anemia Anxiety Disorder Adenomatous Colon Polyps Hypertension Pneumonia TMJ      - Dr. Christia Reading C diff colitis  - april 2011 Colonic polyps, hx of  Anemia-iron deficiency  Past Surgical History: Reviewed history from 06/30/2009 and no changes required. Vulvectomy for cancerous cells Tubal pregnancy  Vital Signs:  Patient profile:   60 year old female Height:      65 inches (165.10 cm) Weight:      262.38 pounds (119.26 kg) BMI:     43.82 O2 Sat:      99 % on Room air Temp:     97.9 degrees F (36.61 degrees C) oral Pulse rate:   86 / minute BP sitting:   112 / 70  (left arm) Cuff size:  large  Vitals Entered By: Michel Bickers CMA (August 12, 2010 1:36 PM)  O2 Sat at Rest %:  99 O2 Flow:  Room air CC: Asthma...sarcoid...osa...patient says she is doing well on cpap...she does c/o sob with exertion and has noticed her heartrate is elevated when moving Comments Medications reviewed with the patient. Daytime phone verified. Michel Bickers [facility]  August 12, 2010 1:37 PM   Physical Exam  General:  normal appearance, healthy appearing, and obese.   Nose:  no sinus tenderness, no discharge Mouth:  no deformity or lesions Neck:  no JVD.   Lungs:  bronchial breath sounds upper lobes, no wheeze Heart:  regular rhythm, normal rate, and no murmurs.   Extremities:  no clubbing, cyanosis, edema, or deformity noted Neurologic:  normal strength Cervical Nodes:  no significant adenopathy   Impression & Recommendations:  Problem  # 1:  ASTHMA (ICD-493.90) Her asthma symptoms have improved.  Will have her stop singulair.  She is to continue her other regimen.  Problem # 2:  OBSTRUCTIVE SLEEP APNEA (ICD-327.23) She is doing well with CPAP.  Problem # 3:  ALLERGIC RHINITIS (ICD-477.9) Stable.  She is to continue her sinus regimen.  Problem # 4:  HOARSENESS (ICD-784.42) Improved.  She is to continue her sinus regimen, and nebulized asthma regimen.  Will also continue reflux therapy.  Problem # 5:  SARCOIDOSIS (ICD-135) Stable.  Will monitor clinically.  Complete Medication List: 1)  Pulmicort 0.5 Mg/89ml Susp (Budesonide) .... One vial nebulized two times a day as needed 2)  Proair Hfa 108 (90 Base) Mcg/act Aers (Albuterol sulfate) .... 2 puffs four times daily as needed 3)  Albuterol Sulfate (2.5 Mg/11ml) 0.083% Nebu (Albuterol sulfate) .... One vial nebulized up to four times per day as needed 4)  Flonase 50 Mcg/act Susp (Fluticasone propionate) .... Two sprays once daily 5)  Vitamin D 2000 Unit Tabs (Cholecalciferol) .[name].. 1 by mouth daily 6)  Enablex 7.5 Mg Xr24h-tab (Darifenacin hydrobromide) .[name].. 1 by mouth daily 7)  Dexilant 60 Mg Cpdr (Dexlansoprazole) .[name].. 1 by mouth daily 8)  [name] Hct 40-25 Mg Tabs (Olmesartan medoxomil-hctz) .[name].. 1 by mouth daily 9)  Bystolic 5 Mg Tabs (Nebivolol hcl) .[name].. 1 by mouth daily 10)  Reglan 5 Mg Tabs (Metoclopramide hcl) .... Take one by mouth three times a day as needed 11)  Align Caps (Misc intestinal flora regulat) .... Take one capsule by mouth daily 12)  Xanax 1 Mg Tabs (Alprazolam) .[name].. 1 by mouth two times a day as needed  - to fill june 2 , 2011 13)  Florastor 250 Mg Caps (Saccharomyces boulardii) .... Take 1 tab daily 14)  Tramadol Hcl 50 Mg Tabs (Tramadol hcl) .[name].. 1 - 2 by mouth q 6 hrs as needed pain 15)  Shower Chair  .... Use asd  Other Orders: Est. Patient Level III (66440)  Patient Instructions: 1)  Stop singulair 2)  Call if breathing gets worse after  stopping singulair 3)  Follow up in 6 months

## 2011-01-05 NOTE — Progress Notes (Signed)
Summary: triage  Phone Note Call from Patient Call back at Home Phone [phone]   Caller: Patient Call For: Russella Dar Reason for Call: Talk to Nurse Summary of Call: Patient states that she is not better still has diarrhea and pain in her back, is not urinating normally for all that she is drinking and has had a headache for the last three days. Patient is also coughing and was told by her nurse that she needs to see pulmonary. Initial call taken by: Tawni Levy,  March 19, 2010 12:01 PM  Follow-up for Phone Call        Patient still having 6-7 "bulky" stools a day.  She was seen by her home health nurse this am and she was advised she needs to see Pulmonary for her new dry cough.  She is also concerned about her decreased urination.  I have asked her to call back her primary care about the back pain and her decrease in urination.  She doesn't want to return to her primary care, she has no confidence in her.  She wanted to see Dr Felicity Coyer, but she is not taking new patient's until May or June.  She has decided she is going to try and establish with one of the primary care MD's at [facility].  [name]  please advise if she needs to do anything more about her frequent , non diarrhea stools.  She is on Flagyl as perscribed last Friday. Follow-up by: Darcey Nora RN, CGRN,  March 19, 2010 12:18 PM  Additional Follow-up for Phone Call Additional follow up Details #1::        Frequency of stool improved and sounds like they are firming up. Yes, she does need a PCP but is suppose to follow up with [place] as well. She needs to drink plenty of fluids. Let's check Bmet on Monday to make sure renal function hasn't deteriorated. Thanks Additional Follow-up by: Willette Cluster NP,  March 20, 2010 4:16 PM    Additional Follow-up for Phone Call Additional follow up Details #2::    Patient  was seen by Dr Jonny Ruiz to establish care on Friday.  She can't come today for labs due to transportation, she will come  tomorrow am.   Follow-up by: Darcey Nora RN, CGRN,  March 23, 2010 9:12 AM

## 2011-01-05 NOTE — Progress Notes (Signed)
Summary: refill for singulair  Phone Note Call from Patient   Caller: Patient Call For: sood Summary of Call: need singulair prescript refilled [facility] [address] Initial call taken by: Rickard Patience,  September 09, 2010 8:20 AM  Follow-up for Phone Call        [id number]. Looks like singulair was stopped at last OV. Did breathing get worse off of it?Carron Curie CMA  September 09, 2010 9:28 AM   Dr. Craige Cotta-- Per last OV note pt was to stop singulair and if problems occured to call. Pt states she stopped singulair and she began to have constant nasal congestion and SOB due to this. Pt wants to start back on Singulair. Pt states she needs to have this sent in ASAP because she does not drive and has to have medical transport take her to pharmacy today at 12:30. Please advise if ok to fill. Carron Curie CMA  September 09, 2010 11:16 AM   Additional Follow-up for Phone Call Additional follow up Details #1::        per [facility] ok to refill. Carron Curie CMA  September 09, 2010 11:32 AM     New/Updated Medications: SINGULAIR 10 MG TABS (MONTELUKAST SODIUM) Take 1 tab by mouth at bedtime Prescriptions: SINGULAIR 10 MG TABS (MONTELUKAST SODIUM) Take 1 tab by mouth at bedtime  #30 x 3   Entered by:   Carron Curie CMA   Authorized by:   Coralyn Helling MD   Signed by:   Carron Curie CMA on 09/09/2010   Method used:   [facility]  [address] [id number]       [address]       [place], [place]  [address]       Ph: [phone] or [phone]       Fax: [phone]   RxID:   [id number]

## 2011-01-05 NOTE — Miscellaneous (Signed)
Summary: HIPAA Restrictions  HIPAA Restrictions   Imported By: Florinda Marker 04/28/2010 14:29:14  _____________________________________________________________________  External Attachment:    Type:   Image     Comment:   External Document

## 2011-01-07 NOTE — Procedures (Signed)
Summary: Flex Sigmoid / [facility]  Flex Sigmoid / [facility]   Imported By: Lennie Odor 12/18/2010 16:38:45  _____________________________________________________________________  External Attachment:    Type:   Image     Comment:   External Document

## 2011-01-07 NOTE — Progress Notes (Signed)
  Phone Note Other Incoming   Request: Send information Summary of Call: Records received from [facility] in [place], [place]. 8 pages forwarded to Dr. Russella Dar for review.

## 2011-01-07 NOTE — Progress Notes (Signed)
Summary: Medication  Phone Note Call from Patient Call back at Home Phone [phone]   Caller: Patient Call For: Dr. Russella Dar Reason for Call: Talk to Nurse Summary of Call: Pt needs movi prep [facility] [address] also wants it noted that she no longer sees Dr. Jonny Ruiz but now she sees Dr. Felicity Coyer Initial call taken by: [place] Johnson,  November 27, 2010 12:25 PM    Spoke with patient and she wants Miralax called in, the pharmacy is stating that they did not receive the prescription on 11/17/10. New script sent in. Selinda Michaels, RN Prescriptions: MIRALAX  POWD (POLYETHYLENE GLYCOL 3350) 1-2 doses per day as needed  #527 x 3   Entered by:   Selinda Michaels RN   Authorized by:   Meryl Dare MD [facility]   Signed by:   Selinda Michaels RN on 11/27/2010   Method used:   [facility]        [facility]  [address] [id number]       [address]       [place], [place]  [address]       Ph: [phone] or [phone]       Fax: [phone]   RxID:   [id number]

## 2011-01-07 NOTE — Letter (Signed)
Summary: CMN for CPAP Supplies/[facility]  CMN for CPAP Supplies/[facility]   Imported By: Sherian Rein 11/20/2010 14:12:52  _____________________________________________________________________  External Attachment:    Type:   Image     Comment:   External Document

## 2011-01-07 NOTE — Letter (Signed)
Summary: [facility]   Imported By: Lennie Odor 12/18/2010 16:32:37  _____________________________________________________________________  External Attachment:    Type:   Image     Comment:   External Document

## 2011-01-07 NOTE — Procedures (Signed)
Summary: EGD / [facility]  EGD / [facility]   Imported By: Lennie Odor 12/18/2010 16:37:52  _____________________________________________________________________  External Attachment:    Type:   Image     Comment:   External Document

## 2011-01-07 NOTE — Assessment & Plan Note (Signed)
Summary: stomach burning...as.   History of Present Illness Visit Type: Follow-up Visit Primary GI MD: Elie Goody MD [facility] Primary Provider: Corwin Levins MD Chief Complaint: GERD , burning in stomach after meals History of Present Illness:   Cynthia Kirk relates burning lower abdominal pain and constipation. Her symptoms are more bothersome after meals, when she is more constipated, and when she eats fruits and vegetables. She takes Metamucil on a regular basis for constipation. We have not received records from her prior endoscopy and colonoscopy done in [place], [place].   GI Review of Systems    Reports abdominal pain, acid reflux, belching, bloating, heartburn, and  nausea.     Location of  Abdominal pain: upper abdomen.    Denies chest pain, dysphagia with liquids, dysphagia with solids, loss of appetite, vomiting, vomiting blood, weight loss, and  weight gain.      Reports black tarry stools, change in bowel habits, constipation, diarrhea, and  rectal pain.     Denies anal fissure, diverticulosis, fecal incontinence, heme positive stool, hemorrhoids, irritable bowel syndrome, jaundice, light color stool, liver problems, and  rectal bleeding.   Current Medications (verified): 1)  Pulmicort 0.5 Mg/56ml Susp (Budesonide) .... One Vial Nebulized Two Times A Day As Needed 2)  Proair Hfa 108 (90 Base) Mcg/act Aers (Albuterol Sulfate) .... 2 Puffs Four Times Daily As Needed 3)  Albuterol Sulfate (2.5 Mg/20ml) 0.083% Nebu (Albuterol Sulfate) .... One Vial Nebulized Up To Four Times Per Day As Needed 4)  Flonase 50 Mcg/act Susp (Fluticasone Propionate) .... Two Sprays Once Daily 5)  Vitamin D 2000 Unit Tabs (Cholecalciferol) .[name].. 1 By Mouth Daily 6)  Enablex 7.5 Mg Xr24h-Tab (Darifenacin Hydrobromide) .[name].. 1 By Mouth Daily 7)  Dexilant 60 Mg Cpdr (Dexlansoprazole) .[name].. 1 By Mouth Daily 8)  [name] Hct 40-25 Mg Tabs (Olmesartan Medoxomil-Hctz) .[name].. 1 By Mouth Daily 9)  Bystolic 5 Mg  Tabs (Nebivolol Hcl) .[name].. 1 By Mouth Daily 10)  Xanax 1 Mg Tabs (Alprazolam) .[name].. 1 By Mouth Two Times A Day As Needed  - To Fill June 2 , 2011 11)  Tramadol Hcl 50 Mg Tabs (Tramadol Hcl) .[name].. 1 - 2 By Mouth Q 6 Hrs As Needed Pain 12)  Singulair 10 Mg Tabs (Montelukast Sodium) .... Take 1 Tab By Mouth At Bedtime 13)  Folic Acid 400 Mcg Tabs (Folic Acid) .... Take 1 By Mouth Once Daily 14)  Vitamin B-12 1000 Mcg Tabs (Cyanocobalamin) .... Take 1 By Mouth Once Daily 15)  Calcium Caltrate Liquid .... Drink Once Daily 16)  Phentermine Hcl 37.5 Mg Caps (Phentermine Hcl) .[name].. 1 By Mouth Once Daily  Allergies (verified): 1)  ! Asa 2)  ! * Latex  Past History:  Past Medical History: Reviewed history from 09/14/2010 and no changes required. Obstructive sleep apnea      - RDI 10.5      - CPAP 9 cm H2O DM - diet controlled   Sarcoidosis      - Dx 1996 @ [facility] with Tbx. stable and in remission HTN GERD      - Dr. Karolee Ohs Goiter Asthma      - Spirometry 06/30/09 FEV1 1.69(87%), FVC 2.17(83%), FEV1% 78 Anemia Anxiety Disorder Adenomatous Colon Polyps Hypertension Pneumonia TMJ      - Dr. Christia Reading C diff colitis  - april 2011 Colonic polyps, hx of  Anemia-iron deficiency  Past Surgical History: Vulvectomy for cancerous cells Tubal pregnancy x2 Uretha lift D & C  Family History: Reviewed history from 03/20/2010 and no changes required. Father - HTN, CAD s/p MI, Seizure Mother - MVA Sister - Kidney problem from birth, on dialysis Daughter - Asthma Son - Asthma Family History of Diabetes: Father? No FH of Colon Cancer: Uncle -Lung Cancer sister with osteoporosis  Social History: Reviewed history from 09/14/2010 and no changes required. She is divorced, lives alone and has two kids.  Denies smoking.  Retired. Worked as a Scientist, research (life sciences). Alcohol Use - no Illicit Drug Use - no  Review of Systems       The patient complains of arthritis/joint pain  and back pain.  The patient denies allergy/sinus, anemia, anxiety-new, blood in urine, breast changes/lumps, change in vision, confusion, cough, coughing up blood, depression-new, fainting, fatigue, fever, headaches-new, hearing problems, heart murmur, heart rhythm changes, itching, menstrual pain, muscle pains/cramps, night sweats, nosebleeds, pregnancy symptoms, shortness of breath, skin rash, sleeping problems, sore throat, swelling of feet/legs, swollen lymph glands, thirst - excessive , urination - excessive , urination changes/pain, urine leakage, vision changes, and voice change.    Vital Signs:  Patient profile:   60 year old female Height:      65 inches Weight:      259.50 pounds BMI:     43.34 Pulse rate:   60 / minute Pulse rhythm:   regular BP sitting:   110 / 68  (left arm) Cuff size:   large  Vitals Entered By: June McMurray CMA [facility] Dull) (November 17, 2010 2:38 PM)  Physical Exam  General:  Well developed, well nourished, no acute distress. obese.   Head:  Normocephalic and atraumatic. Eyes:  PERRLA, no icterus. Ears:  Normal auditory acuity. Mouth:  No deformity or lesions, dentition normal. Neck:  Supple; no masses or thyromegaly. Lungs:  Clear throughout to auscultation. Heart:  Regular rate and rhythm; no murmurs, rubs,  or bruits. Abdomen:  Soft and nondistended. No masses, hepatosplenomegaly or hernias noted. Normal bowel sounds. Mild lower rebound tenderness, without rebound or guarding Msk:  Symmetrical with no gross deformities. Normal posture. Pulses:  Normal pulses noted. Extremities:  No clubbing, cyanosis, edema or deformities noted. Neurologic:  Alert and  oriented x4;  grossly normal neurologically. Cervical Nodes:  No significant cervical adenopathy. Inguinal Nodes:  No significant inguinal adenopathy. Psych:  Alert and cooperative. Normal mood and affect.  Impression & Recommendations:  Problem # 1:  ABDOMINAL PAIN, LOWER (ICD-789.09) I suspect her  pain may be related to constipation. Will again attempt to obtain records of her previous endoscopy and colonoscopy done in [place]. Begin MiraLax once to twice daily in addition to a high-fiber diet, Metamucil, and increased water intake. Orders: TLB-CMP (Comprehensive Metabolic Pnl) ([id number]) TLB-CBC Platelet - w/Differential (85025-CBCD) T-Abdomen 2-view (74020TC)  Problem # 2:  GERD (ICD-530.81) Continue Dexliant 60 mg q.a.m.  Problem # 3:  PERSONAL HX COLONIC POLYPS (ICD-V12.72) Awaiting prior colonoscopy records.  Patient Instructions: 1)  Copy sent to : Oliver Barre MD 2)  We have sent your Miralax prescription to your pharmacy. 3)  Please go to the basement Radiology dept today for your Xray. 4)  Please go to the basement lab today for your labs. 5)  The medication list was reviewed and reconciled.  All changed / newly prescribed medications were explained.  A complete medication list was provided to the patient / caregiver. Prescriptions: MIRALAX  POWD (POLYETHYLENE GLYCOL 3350) 1-2 doses per day as needed  #527 x 3   Entered by:  Chales Abrahams CMA (AAMA)   Authorized by:   Meryl Dare MD [facility]   Signed by:   Meryl Dare MD FACG on 11/17/2010   Method used:   Historical   RxID:   [id number]

## 2011-01-21 NOTE — Consult Note (Signed)
Summary: [facility]  [facility]   Imported By: Lennie Odor 01/15/2011 15:42:09  _____________________________________________________________________  External Attachment:    Type:   Image     Comment:   External Document

## 2011-01-25 ENCOUNTER — Telehealth: Payer: Self-pay | Admitting: Internal Medicine

## 2011-02-02 NOTE — Progress Notes (Signed)
Summary: Med refill  Phone Note Refill Request Message from:  Fax from Pharmacy on January 25, 2011 11:05 AM  Refills Requested: Medication #1:  XANAX 1 MG TABS 1 by mouth two times a day as needed  - to fill june 2   Dosage confirmed as above?Dosage Confirmed   Last Refilled: 05/07/2010   [facility] [address] [phone] Initial call taken by: Zella Ball Ewing CMA [facility]),  January 25, 2011 11:06 AM  Follow-up for Phone Call        faxed hardcopy to pharmacy Follow-up by: Robin Ewing CMA [facility] Dull),  January 25, 2011 12:54 PM    New/Updated Medications: XANAX 1 MG TABS (ALPRAZOLAM) 1 by mouth two times a day as needed  - to fill Jan 25, 2011 Prescriptions: XANAX 1 MG TABS (ALPRAZOLAM) 1 by mouth two times a day as needed  - to fill Jan 25, 2011  #60 x 2   Entered and Authorized by:   Corwin Levins MD   Signed by:   Corwin Levins MD on 01/25/2011   Method used:   Print then Give to Patient   RxID:   [id number]  done hardcopy to LIM side B - dahlia  Corwin Levins MD  January 25, 2011 12:12 PM

## 2011-02-05 ENCOUNTER — Encounter: Payer: Self-pay | Admitting: Gastroenterology

## 2011-02-05 NOTE — Discharge Summary (Signed)
  NAMEMargaretha Kirk           ACCOUNT NO.:  [id number]  MEDICAL RECORD NO.:  [id number]          PATIENT TYPE:  LOCATION:                                 FACILITY:  PHYSICIAN:  Ceilidh Torregrossa D. Pecola Leisure, M.D.        DATE OF BIRTH:  DATE OF ADMISSION:  02/27/2010 DATE OF DISCHARGE:  03/09/2010                              DISCHARGE SUMMARY   ADMITTING DIAGNOSIS:  Cough.  SECONDARY DIAGNOSES: 1. Asthma. 2. Bronchitis. 3. Hypertension. 4. Temporomandibular joint dysfunction 5. Migraine headaches. 6. Diabetes.  DISCHARGE DIAGNOSES: 1. Asthma. 2. Bronchitis. 3. Hypertension. 4. Temporomandibular joint dysfunction 5. Migraine headaches. 6. Diabetes.  PRIMARY DIAGNOSIS:  Bilateral interstitial disease.  HOSPITAL COURSE:  The patient was admitted for a 6-week history of chronic cough, which had become progressively worse.  She had reported a history of sarcoidosis in the past without any documentation of it.  She was admitted for ongoing nonproductive cough.  The patient had an x-ray done at the time of her admission, which showed patchy airspace opacities throughout both upper lobes.  A CT of her chest was followed, which showed bilaterally predominantly upper lobe interstitial lung disease.  The patient was placed on 1 g Rocephin IV q.24 h. along with 500 mg azithromycin IV daily.  She was continued on her home medications.  The patient throughout her course of hospitalization complained of a cough, subsequently that improved throughout her visit. She also did complain of vaginal itching, which gave her diagnosis of vaginitis at that time along with diarrhea that occurred several times throughout her admission when each of these issues was addressed.  As the patient's health continued to increase with evidence of improvement of her coughing.  She was continued on her antibiotic orally and was given a discharge date of March 05, 2010.  At that time, the patient refused to go  home and wanted to appeal her discharge.  This process was pursued through [facility] to dispute her appeal for discharge since there was no medical reason to have her stay on as an inpatient.  The patient was, however, discharged home in stable condition on March 09, 2010.  She is to continue taking her prednisone 20 mg 2 tablets twice a day along with her Tylox 1-2 tablets q.4-6 h.  Her azithromycin was reinforced to 150 mg 1 tablet a day, promethazine DM 5 mL q.4 h.  We already have given a referral for [facility] to do an in-house assessment and the patient was to follow up with Dr. Pecola Leisure within 10 business days.          ______________________________ Isidoro Donning Pecola Leisure, M.D.     BDR/MEDQ  D:  01/06/2011  T:  01/07/2011  Job:  [id number]  Electronically Signed by Leilani Able M.D. on 02/05/2011 12:37:25 PM

## 2011-02-21 LAB — CBC
HCT: 28.6 % — ABNORMAL LOW (ref 36.0–46.0)
MCHC: 35 g/dL (ref 30.0–36.0)
MCV: 90.4 fL (ref 78.0–100.0)

## 2011-02-22 LAB — CBC
MCHC: 33.3 g/dL (ref 30.0–36.0)
MCV: 90.6 fL (ref 78.0–100.0)
Platelets: 233 10*3/uL (ref 150–400)
RDW: 15.9 % — ABNORMAL HIGH (ref 11.5–15.5)

## 2011-02-22 LAB — HCG, SERUM, QUALITATIVE: Preg, Serum: NEGATIVE

## 2011-02-22 LAB — BASIC METABOLIC PANEL
BUN: 22 mg/dL (ref 6–23)
CO2: 30 mEq/L (ref 19–32)
Chloride: 102 mEq/L (ref 96–112)
Creatinine, Ser: 0.9 mg/dL (ref 0.4–1.2)
Glucose, Bld: 84 mg/dL (ref 70–99)

## 2011-02-24 LAB — GLUCOSE, CAPILLARY
Glucose-Capillary: 101 mg/dL — ABNORMAL HIGH (ref 70–99)
Glucose-Capillary: 136 mg/dL — ABNORMAL HIGH (ref 70–99)
Glucose-Capillary: 97 mg/dL (ref 70–99)

## 2011-03-01 LAB — GLUCOSE, CAPILLARY
Glucose-Capillary: 105 mg/dL — ABNORMAL HIGH (ref 70–99)
Glucose-Capillary: 107 mg/dL — ABNORMAL HIGH (ref 70–99)
Glucose-Capillary: 109 mg/dL — ABNORMAL HIGH (ref 70–99)
Glucose-Capillary: 113 mg/dL — ABNORMAL HIGH (ref 70–99)
Glucose-Capillary: 117 mg/dL — ABNORMAL HIGH (ref 70–99)
Glucose-Capillary: 126 mg/dL — ABNORMAL HIGH (ref 70–99)
Glucose-Capillary: 141 mg/dL — ABNORMAL HIGH (ref 70–99)
Glucose-Capillary: 155 mg/dL — ABNORMAL HIGH (ref 70–99)
Glucose-Capillary: 74 mg/dL (ref 70–99)
Glucose-Capillary: 83 mg/dL (ref 70–99)
Glucose-Capillary: 88 mg/dL (ref 70–99)
Glucose-Capillary: 92 mg/dL (ref 70–99)
Glucose-Capillary: 94 mg/dL (ref 70–99)
Glucose-Capillary: 98 mg/dL (ref 70–99)

## 2011-03-01 LAB — DIFFERENTIAL
Basophils Absolute: 0 10*3/uL (ref 0.0–0.1)
Basophils Absolute: 0 10*3/uL (ref 0.0–0.1)
Basophils Relative: 0 % (ref 0–1)
Eosinophils Absolute: 0.3 10*3/uL (ref 0.0–0.7)
Eosinophils Relative: 2 % (ref 0–5)
Lymphocytes Relative: 26 % (ref 12–46)
Monocytes Absolute: 0.5 10*3/uL (ref 0.1–1.0)
Monocytes Relative: 7 % (ref 3–12)
Monocytes Relative: 8 % (ref 3–12)
Neutrophils Relative %: 62 % (ref 43–77)

## 2011-03-01 LAB — CBC
HCT: 31.8 % — ABNORMAL LOW (ref 36.0–46.0)
HCT: 35.2 % — ABNORMAL LOW (ref 36.0–46.0)
Hemoglobin: 10.8 g/dL — ABNORMAL LOW (ref 12.0–15.0)
Hemoglobin: 11.9 g/dL — ABNORMAL LOW (ref 12.0–15.0)
MCHC: 34 g/dL (ref 30.0–36.0)
RBC: 3.54 MIL/uL — ABNORMAL LOW (ref 3.87–5.11)
RBC: 3.91 MIL/uL (ref 3.87–5.11)
RDW: 14.3 % (ref 11.5–15.5)
WBC: 6.4 10*3/uL (ref 4.0–10.5)

## 2011-03-01 LAB — POCT I-STAT, CHEM 8
BUN: 18 mg/dL (ref 6–23)
Calcium, Ion: 1.14 mmol/L (ref 1.12–1.32)
Glucose, Bld: 89 mg/dL (ref 70–99)
TCO2: 32 mmol/L (ref 0–100)

## 2011-03-01 LAB — COMPREHENSIVE METABOLIC PANEL
ALT: 14 U/L (ref 0–35)
Alkaline Phosphatase: 84 U/L (ref 39–117)
CO2: 26 mEq/L (ref 19–32)
Chloride: 107 mEq/L (ref 96–112)
Glucose, Bld: 130 mg/dL — ABNORMAL HIGH (ref 70–99)
Potassium: 3.6 mEq/L (ref 3.5–5.1)
Sodium: 139 mEq/L (ref 135–145)
Total Bilirubin: 0.4 mg/dL (ref 0.3–1.2)
Total Protein: 6 g/dL (ref 6.0–8.3)

## 2011-03-01 LAB — URINALYSIS, ROUTINE W REFLEX MICROSCOPIC
Glucose, UA: NEGATIVE mg/dL
pH: 6 (ref 5.0–8.0)

## 2011-03-01 LAB — POCT CARDIAC MARKERS: Troponin i, poc: <0.05 ng/mL (ref 0.00–0.09)

## 2011-03-01 LAB — POCT PREGNANCY, URINE: Preg Test, Ur: NEGATIVE

## 2011-03-16 ENCOUNTER — Encounter: Payer: Self-pay | Admitting: *Deleted

## 2011-04-05 ENCOUNTER — Other Ambulatory Visit: Payer: Self-pay | Admitting: Internal Medicine

## 2011-04-06 ENCOUNTER — Other Ambulatory Visit: Payer: Self-pay | Admitting: Internal Medicine

## 2011-04-12 ENCOUNTER — Encounter: Payer: Self-pay | Admitting: Internal Medicine

## 2011-04-13 ENCOUNTER — Ambulatory Visit: Payer: Self-pay | Admitting: Internal Medicine

## 2011-04-13 DIAGNOSIS — Z0289 Encounter for other administrative examinations: Secondary | ICD-10-CM

## 2011-04-20 ENCOUNTER — Other Ambulatory Visit: Payer: Self-pay | Admitting: *Deleted

## 2011-04-20 MED ORDER — MONTELUKAST SODIUM 10 MG PO TABS: 10.0000 mg | ORAL_TABLET | Freq: Every day | ORAL | Status: DC

## 2011-06-02 ENCOUNTER — Ambulatory Visit (INDEPENDENT_AMBULATORY_CARE_PROVIDER_SITE_OTHER): Payer: Medicare Other | Admitting: Pulmonary Disease

## 2011-06-02 ENCOUNTER — Encounter: Payer: Self-pay | Admitting: Pulmonary Disease

## 2011-06-02 DIAGNOSIS — J309 Allergic rhinitis, unspecified: Secondary | ICD-10-CM

## 2011-06-02 DIAGNOSIS — D869 Sarcoidosis, unspecified: Secondary | ICD-10-CM

## 2011-06-02 DIAGNOSIS — J45909 Unspecified asthma, uncomplicated: Secondary | ICD-10-CM

## 2011-06-02 DIAGNOSIS — G4733 Obstructive sleep apnea (adult) (pediatric): Secondary | ICD-10-CM

## 2011-06-02 MED ORDER — BUDESONIDE 0.5 MG/2ML IN SUSP: 0.5000 mg | Freq: Two times a day (BID) | RESPIRATORY_TRACT | Status: DC | PRN

## 2011-06-02 NOTE — Assessment & Plan Note (Signed)
Not an active issue.

## 2011-06-02 NOTE — Assessment & Plan Note (Signed)
She is to continue flonase. 

## 2011-06-02 NOTE — Progress Notes (Signed)
Subjective:    Patient ID: Cynthia Kirk, female    DOB: [birth year], 60 y.o.   MRN: [id number]  HPI 60 yo female in follow up for Asthma, Sarcoidosis, OSA, Rhinitis, and Hoarseness.  She has more cough and phlegm in her throat.  This is mostly in the morning.  She is bringing up white to yellow sputum.  She gets occasional chest congestion, and some cough during the day.  Her sinuses are doing okay.  She is doing well with CPAP.  She is not using her pulmicort all the time.  She uses her albuterol occasionally.  She was recently married, and will be moving to [place] in 3 to 4 months.  Past Medical History  Diagnosis Date  . Colon polyp   . Hypertension   . Type II or unspecified type diabetes mellitus without mention of complication, not stated as uncontrolled   . Morbid obesity   . Iron deficiency anemia, unspecified   . Esophageal reflux   . Allergic rhinitis, cause unspecified   . Obstructive sleep apnea (adult) (pediatric)   . Unspecified asthma   . Sarcoidosis   . Unspecified menopausal and postmenopausal disorder   . Intestinal infection due to Clostridium difficile      Family History  Problem Relation Age of Onset  . Heart attack Father   . Hypertension Father   . Diabetes Father   . Kidney disease Sister   . Osteoporosis Sister   . Asthma Daughter   . Asthma Son   . Osteoporosis Sister   . Lung cancer Maternal Uncle      History   Social History  . Marital Status: Divorced    Spouse Name: N/A    Number of Children: N/A  . Years of Education: N/A   Occupational History  . Not on file.   Social History Main Topics  . Smoking status: Never Smoker   . Smokeless tobacco: Never Used  . Alcohol Use: No  . Drug Use: No  . Sexually Active: Not on file   Other Topics Concern  . Not on file   Social History Narrative  . No narrative on file     Allergies  Allergen Reactions  . Aspirin     REACTION: hives  . Latex     REACTION: rash      Outpatient Prescriptions Prior to Visit  Medication Sig Dispense Refill  . albuterol (PROVENTIL HFA;VENTOLIN HFA) 108 (90 BASE) MCG/ACT inhaler Inhale 2 puffs into the lungs 4 (four) times daily as needed.        [name] albuterol (PROVENTIL) (2.5 MG/3ML) 0.083% nebulizer solution Take 2.5 mg by nebulization every 6 (six) hours as needed.        . ALPRAZolam (XANAX) 1 MG tablet Take 1 mg by mouth 2 (two) times daily as needed.        [name] [name] HCT 40-25 MG per tablet TAKE 1 TABLET DAILY.  30 tablet  5  . BYSTOLIC 5 MG tablet TAKE 1 TABLET BY MOUTH EVERY DAY  30 tablet  7  . Cholecalciferol (VITAMIN D) 2000 UNITS tablet Take 1,000 Units by mouth daily.       [name] darifenacin (ENABLEX) 7.5 MG 24 hr tablet Take 15 mg by mouth daily.       [name] dexlansoprazole (DEXILANT) 60 MG capsule Take 60 mg by mouth daily.        . fluticasone (FLONASE) 50 MCG/ACT nasal spray 2 sprays by Nasal route daily.        [name]  montelukast (SINGULAIR) 10 MG tablet Take 1 tablet (10 mg total) by mouth at bedtime.  30 tablet  6  . phentermine 37.5 MG capsule Take 37.5 mg by mouth every morning.        . polyethylene glycol (MIRALAX / GLYCOLAX) packet Take 17 g by mouth daily.        . vitamin B-12 (CYANOCOBALAMIN) 1000 MCG tablet Take 1,000 mcg by mouth daily.        . budesonide (PULMICORT) 0.5 MG/2ML nebulizer solution Take 0.5 mg by nebulization 2 (two) times daily as needed.        . folic acid (FOLVITE) 400 MCG tablet Take 400 mcg by mouth daily.        . traMADol (ULTRAM) 50 MG tablet Take 50 mg by mouth every 6 (six) hours as needed.           Review of Systems     Objective:   Physical Exam  BP 108/72  Pulse 75  Temp(Src) 98.2 F (36.8 C) (Oral)  Ht 5\' 5"  (1.651 m)  Wt 258 lb 3.2 oz (117.119 kg)  BMI 42.97 kg/m2  SpO2 96%  General: normal appearance, healthy appearing, and obese.  Nose: no sinus tenderness, no discharge  Mouth: no deformity or lesions  Neck: no JVD.  Lungs: bronchial breath sounds upper  lobes, no wheeze  Heart: regular rhythm, normal rate, and no murmurs.  Extremities: no clubbing, cyanosis, edema, or deformity noted  Neurologic: normal strength  Cervical Nodes: no significant adenopathy      Assessment & Plan:   OBSTRUCTIVE SLEEP APNEA She is doing well with this.  ASTHMA Advised her to try using pulmicort on a regular basis.  If she improves, she could then stop singulair.  ALLERGIC RHINITIS She is to continue flonase.  Sarcoidosis Not an active issue.    Updated Medication List Outpatient Encounter Prescriptions as of 06/02/2011  Medication Sig Dispense Refill  . albuterol (PROVENTIL HFA;VENTOLIN HFA) 108 (90 BASE) MCG/ACT inhaler Inhale 2 puffs into the lungs 4 (four) times daily as needed.        [name] albuterol (PROVENTIL) (2.5 MG/3ML) 0.083% nebulizer solution Take 2.5 mg by nebulization every 6 (six) hours as needed.        . ALPRAZolam (XANAX) 1 MG tablet Take 1 mg by mouth 2 (two) times daily as needed.        [name] [name] HCT 40-25 MG per tablet TAKE 1 TABLET DAILY.  30 tablet  5  . budesonide (PULMICORT) 0.5 MG/2ML nebulizer solution Take 2 mLs (0.5 mg total) by nebulization 2 (two) times daily as needed.  60 mL  11  . BYSTOLIC 5 MG tablet TAKE 1 TABLET BY MOUTH EVERY DAY  30 tablet  7  . Cholecalciferol (VITAMIN D) 2000 UNITS tablet Take 1,000 Units by mouth daily.       [name] CORAL CALCIUM PO Take by mouth daily.        [name] darifenacin (ENABLEX) 7.5 MG 24 hr tablet Take 15 mg by mouth daily.       [name] dexlansoprazole (DEXILANT) 60 MG capsule Take 60 mg by mouth daily.        . fluticasone (FLONASE) 50 MCG/ACT nasal spray 2 sprays by Nasal route daily.        . Misc Natural Products (GLUCOSAMINE CHONDROITIN ADV PO) Take 2 tablets by mouth daily.        . montelukast (SINGULAIR) 10 MG tablet Take 1 tablet (10 mg total)  by mouth at bedtime.  30 tablet  6  . phentermine 37.5 MG capsule Take 37.5 mg by mouth every morning.        . polyethylene glycol (MIRALAX /  GLYCOLAX) packet Take 17 g by mouth daily.        . vitamin B-12 (CYANOCOBALAMIN) 1000 MCG tablet Take 1,000 mcg by mouth daily.        [name] DISCONTD: budesonide (PULMICORT) 0.5 MG/2ML nebulizer solution Take 0.5 mg by nebulization 2 (two) times daily as needed.        [name] DISCONTD: folic acid (FOLVITE) 400 MCG tablet Take 400 mcg by mouth daily.        [name] DISCONTD: traMADol (ULTRAM) 50 MG tablet Take 50 mg by mouth every 6 (six) hours as needed.

## 2011-06-02 NOTE — Assessment & Plan Note (Signed)
Advised her to try using pulmicort on a regular basis.  If she improves, she could then stop singulair.

## 2011-06-02 NOTE — Assessment & Plan Note (Signed)
She is doing well with this.

## 2011-06-02 NOTE — Patient Instructions (Signed)
Use pulmicort twice per day If breathing is okay, then can try stopping singulair Follow up in 2 to 3 months

## 2011-06-10 ENCOUNTER — Telehealth: Payer: Self-pay | Admitting: Pulmonary Disease

## 2011-06-10 MED ORDER — ALBUTEROL SULFATE HFA 108 (90 BASE) MCG/ACT IN AERS: 2.0000 | INHALATION_SPRAY | Freq: Four times a day (QID) | RESPIRATORY_TRACT | Status: DC | PRN

## 2011-06-10 NOTE — Telephone Encounter (Signed)
LMOM informing pt rx sent to pharmacy.   

## 2011-06-12 ENCOUNTER — Telehealth: Payer: Self-pay | Admitting: Internal Medicine

## 2011-06-12 NOTE — Telephone Encounter (Signed)
06/12/11 On Call-  She called from out of town,  [phone]. Hx sarcoid. New onset epstaxis right nostril, moderate. I suggested lateral compression, limited use of Afrin as a vasoconstictor. If bleeding persists or recurrent, then go to ER or UC. Eventually may need ENT.

## 2011-06-16 ENCOUNTER — Telehealth: Payer: Self-pay | Admitting: Pulmonary Disease

## 2011-06-16 NOTE — Telephone Encounter (Signed)
Pt is aware VS is out of the office until 7/25. Pt states she has a placard now from another state and it will expire soon. She is fine with waiting until [facility] is back and can sign the form. She will also contact her PCP to see if they would be willing to do this for her. She will call back if PCP agrees to do this. In the meantime, I will place handicap placard form in VS look at for him to sign when he returns.

## 2011-06-25 NOTE — Telephone Encounter (Signed)
lmtcb

## 2011-06-25 NOTE — Telephone Encounter (Signed)
Please inform pt that based on her last visit she would not qualify for handicap parking based on her pulmonary disease.  She should d/w her PCP whether she would qualify otherwise.

## 2011-06-28 NOTE — Telephone Encounter (Signed)
LMOMTCB

## 2011-06-28 NOTE — Telephone Encounter (Signed)
Pt advised. Jennifer Castillo, CMA  

## 2011-06-30 ENCOUNTER — Other Ambulatory Visit (INDEPENDENT_AMBULATORY_CARE_PROVIDER_SITE_OTHER): Payer: Medicare Other

## 2011-06-30 ENCOUNTER — Encounter: Payer: Self-pay | Admitting: Internal Medicine

## 2011-06-30 ENCOUNTER — Ambulatory Visit (INDEPENDENT_AMBULATORY_CARE_PROVIDER_SITE_OTHER): Payer: Medicare Other | Admitting: Internal Medicine

## 2011-06-30 VITALS — BP 120/72 | HR 69 | Temp 98.2°F | Ht 65.0 in | Wt 256.1 lb

## 2011-06-30 DIAGNOSIS — R5381 Other malaise: Secondary | ICD-10-CM

## 2011-06-30 DIAGNOSIS — R5383 Other fatigue: Secondary | ICD-10-CM

## 2011-06-30 DIAGNOSIS — E119 Type 2 diabetes mellitus without complications: Secondary | ICD-10-CM

## 2011-06-30 DIAGNOSIS — M791 Myalgia, unspecified site: Secondary | ICD-10-CM

## 2011-06-30 DIAGNOSIS — IMO0001 Reserved for inherently not codable concepts without codable children: Secondary | ICD-10-CM

## 2011-06-30 DIAGNOSIS — R55 Syncope and collapse: Secondary | ICD-10-CM

## 2011-06-30 LAB — BASIC METABOLIC PANEL
BUN: 20 mg/dL (ref 6–23)
Chloride: 100 mEq/L (ref 96–112)
Potassium: 4 mEq/L (ref 3.5–5.1)

## 2011-06-30 LAB — HEPATIC FUNCTION PANEL
ALT: 14 U/L (ref 0–35)
AST: 21 U/L (ref 0–37)
Alkaline Phosphatase: 89 U/L (ref 39–117)
Bilirubin, Direct: 0.1 mg/dL (ref 0.0–0.3)
Total Bilirubin: 0.6 mg/dL (ref 0.3–1.2)

## 2011-06-30 LAB — CBC WITH DIFFERENTIAL/PLATELET
Basophils Absolute: 0.1 10*3/uL (ref 0.0–0.1)
Basophils Relative: 1.4 % (ref 0.0–3.0)
Eosinophils Absolute: 0.1 10*3/uL (ref 0.0–0.7)
Lymphocytes Relative: 31.9 % (ref 12.0–46.0)
MCHC: 32.7 g/dL (ref 30.0–36.0)
Neutrophils Relative %: 57.6 % (ref 43.0–77.0)
RBC: 4.22 Mil/uL (ref 3.87–5.11)

## 2011-06-30 LAB — SEDIMENTATION RATE: Sed Rate: 23 mm/hr — ABNORMAL HIGH (ref 0–22)

## 2011-06-30 NOTE — Assessment & Plan Note (Signed)
Diet controlled - check a1c now

## 2011-06-30 NOTE — Progress Notes (Signed)
Subjective:    Patient ID: Cynthia Kirk, female    DOB: 12-28-1950, 60 y.o.   MRN: [id number]  HPI concerned about fatigue and diffuse body ache - ?systemic problem per her ortho - No joint swelling, weight changes, fever, rash or FH autoimmune dz - No HA or focal weakness - motivational>physical fatigue symptoms   also reviewed chronic med issues:   HTN - reports compliance with ongoing medical treatment and no changes in medication dose or frequency. denies adverse side effects related to current therapy.   DM2 - diet controlled since off steroids- reports compliance with ongoing diet control - denies adverse side effects related to current therapy. checks sugars each am   gerd - reports compliance with ongoing medical treatment and no changes in medication dose or frequency. denies adverse side effects related to current therapy. no abd pain or change in bowels  ongoing obesity and inability to lose weight.  onset of obesity 1990s when dx and tx for sarcoid  reports following low fat + calorie restrcited diet  not improved by exercise 1min/d 7d/wk  prev success tx in med wt loss clinic with b12 shots and phentermine; +fh "big people"  has not attended nutrition counseling or bariatric clinic meetings     Past Medical History  Diagnosis Date  . Colon polyp   . Hypertension   . Type II or unspecified type diabetes mellitus without mention of complication, not stated as uncontrolled   . Morbid obesity   . Iron deficiency anemia, unspecified   . Esophageal reflux   . Allergic rhinitis, cause unspecified   . Obstructive sleep apnea (adult) (pediatric)   . Unspecified asthma   . Sarcoidosis   . Unspecified menopausal and postmenopausal disorder   . Intestinal infection due to Clostridium difficile     Review of Systems  Constitutional: Positive for fatigue. Negative for fever.  Musculoskeletal: Positive for myalgias.  Neurological: Positive for syncope (4 episodes in  past 3 months - occ at rest while talking/laughing - no warning, out 3-5 sec, no incont).       Objective:   Physical Exam BP 120/72  Pulse 69  Temp(Src) 98.2 F (36.8 C) (Oral)  Ht 5\' 5"  (1.651 m)  Wt 256 lb 1.9 oz (116.175 kg)  BMI 42.62 kg/m2  SpO2 98% Wt Readings from Last 3 Encounters:  06/30/11 256 lb 1.9 oz (116.175 kg)  06/02/11 258 lb 3.2 oz (117.119 kg)  11/17/10 259 lb 8 oz (117.708 kg)   Constitutional: She is overweight; oriented to person, place, and time. She appears well-developed and well-nourished. No distress.  Cardiovascular: Normal rate, regular rhythm and normal heart sounds.  No murmur heard. No BLE edema. Pulmonary/Chest: Effort normal and breath sounds normal. No respiratory distress. She has no wheezes.  Musculoskeletal: Normal range of motion, no joint effusions. No gross deformities - FROM without pain Neurological: She is alert and oriented to person, place, and time. No cranial nerve deficit. Coordination normal.  Psychiatric: She has a normal mood and affect. Her behavior is normal. Judgment and thought content normal.   Lab Results  Component Value Date   WBC 4.3* 11/17/2010   HGB 12.3 11/17/2010   HCT 35.7* 11/17/2010   PLT 251.0 11/17/2010   CHOL 162 09/14/2010   TRIG 73.0 09/14/2010   HDL 43.20 09/14/2010   ALT 14 11/17/2010   AST 23 11/17/2010   NA 138 11/17/2010   K 4.0 11/17/2010   CL 100 11/17/2010   CREATININE 0.8  11/17/2010   BUN 18 11/17/2010   CO2 33* 11/17/2010   TSH 0.94 09/14/2010   HGBA1C 6.1 09/14/2010        Assessment & Plan:  See problem list. Medications and labs reviewed today.  Fatigue/myalgias - nonsp exam - check labs for autoimmune dz - consider tx for FM depending on results  Syncope - unprovoked brief episodes of unconsciousness - EKG today unremarkable- consider cardio eval depending on labs

## 2011-06-30 NOTE — Patient Instructions (Signed)
It was good to see you today. Test(s) ordered today. Your results will be called to you after review (48-72hours after test completion). If any changes need to be made, you will be notified at that time. Will consider need for other heart or head testing depending on these results Medications reviewed, no changes at this time. Talk with your orthopedist if handicap tag is needed

## 2011-07-01 ENCOUNTER — Other Ambulatory Visit: Payer: Self-pay | Admitting: Internal Medicine

## 2011-07-01 DIAGNOSIS — R55 Syncope and collapse: Secondary | ICD-10-CM

## 2011-07-01 DIAGNOSIS — M797 Fibromyalgia: Secondary | ICD-10-CM

## 2011-07-01 LAB — ANA: Anti Nuclear Antibody(ANA): NEGATIVE

## 2011-07-01 LAB — LUPUS ANTICOAGULANT PANEL
DRVVT: 45.6 secs — ABNORMAL HIGH (ref 36.2–44.3)
Lupus Anticoagulant: NOT DETECTED

## 2011-07-01 MED ORDER — MILNACIPRAN HCL 25 MG PO TABS: 25.0000 mg | ORAL_TABLET | Freq: Every day | ORAL | Status: DC

## 2011-07-05 ENCOUNTER — Other Ambulatory Visit: Payer: Self-pay | Admitting: Internal Medicine

## 2011-07-05 ENCOUNTER — Institutional Professional Consult (permissible substitution): Payer: Medicare Other | Admitting: Cardiology

## 2011-07-05 NOTE — Progress Notes (Signed)
A user error has taken place: encounter opened in error, closed for administrative reasons.

## 2011-07-12 ENCOUNTER — Ambulatory Visit: Payer: Medicare Other | Admitting: Internal Medicine

## 2011-07-30 ENCOUNTER — Ambulatory Visit: Payer: Medicare Other | Admitting: Pulmonary Disease

## 2011-08-11 ENCOUNTER — Ambulatory Visit: Payer: Medicare Other | Admitting: Gastroenterology

## 2011-09-02 ENCOUNTER — Institutional Professional Consult (permissible substitution): Payer: Medicare Other | Admitting: Cardiology

## 2011-09-02 ENCOUNTER — Encounter: Payer: Self-pay | Admitting: Pulmonary Disease

## 2011-09-02 ENCOUNTER — Ambulatory Visit (INDEPENDENT_AMBULATORY_CARE_PROVIDER_SITE_OTHER): Payer: Medicare Other | Admitting: Pulmonary Disease

## 2011-09-02 VITALS — BP 100/76 | HR 75 | Temp 98.9°F

## 2011-09-02 DIAGNOSIS — J45909 Unspecified asthma, uncomplicated: Secondary | ICD-10-CM

## 2011-09-02 DIAGNOSIS — D869 Sarcoidosis, unspecified: Secondary | ICD-10-CM

## 2011-09-02 DIAGNOSIS — G4733 Obstructive sleep apnea (adult) (pediatric): Secondary | ICD-10-CM

## 2011-09-02 DIAGNOSIS — J309 Allergic rhinitis, unspecified: Secondary | ICD-10-CM

## 2011-09-02 MED ORDER — MOMETASONE FUROATE 220 MCG/INH IN AEPB: 1.0000 | INHALATION_SPRAY | Freq: Every day | RESPIRATORY_TRACT | Status: DC

## 2011-09-02 NOTE — Patient Instructions (Signed)
Asmanex 1 puff at night Follow up in 2 months

## 2011-09-02 NOTE — Assessment & Plan Note (Signed)
Have given her sample of asmanex one puff at night.  She is to continue if this improves her cough.

## 2011-09-02 NOTE — Assessment & Plan Note (Signed)
Seen by Dr. Jenne Pane with ENT

## 2011-09-02 NOTE — Assessment & Plan Note (Signed)
If her cough does not improve with asthma therapy, then will need to repeat chest xray.

## 2011-09-02 NOTE — Assessment & Plan Note (Signed)
Hopefully improving her cough will help improve CPAP use.

## 2011-09-02 NOTE — Progress Notes (Signed)
Subjective:    Patient ID: Cynthia Kirk, female    DOB: November 03, 1951, 60 y.o.   MRN: [id number]  HPI  60 yo female in follow up for Asthma, Sarcoidosis, OSA, Rhinitis, and Hoarseness.  She had two fainting episodes over the summer.  She is due to see Dr. Jens Som with cardiology in October.  She was seen by Dr. Jenne Pane with ENT for nose bleeds.  She had cautery done.  She noticed a cough starting about one week ago.  She had temp 102F when this started, but not since.  She also got nauseous.  She is not bringing up much sputum, but does get yellow phlegm in the morning.  She feels she is getting better.  She is using her albuterol 3 or 4 times per day.  She does not think that she has budesonide anymore.  She has trouble with CPAP at night due to coughing.  Past Medical History  Diagnosis Date  . Hiatal hernia   . Hypertension   . Type II or unspecified type diabetes mellitus without mention of complication, not stated as uncontrolled   . Morbid obesity   . Iron deficiency anemia, unspecified   . Esophageal reflux   . Allergic rhinitis, cause unspecified   . Obstructive sleep apnea (adult) (pediatric)   . Unspecified asthma   . Sarcoidosis 1996    @ [employer] [facility]  . Unspecified menopausal and postmenopausal disorder   . Intestinal infection due to Clostridium difficile   . Iron deficiency anemia   . Anxiety   . TMJ (dislocation of temporomandibular joint)     Family History  Problem Relation Age of Onset  . Heart attack Father   . Hypertension Father   . Diabetes Father   . Kidney disease Sister   . Osteoporosis Sister   . Asthma Daughter   . Asthma Son   . Osteoporosis Sister   . Lung cancer Maternal Uncle     Social History  . Marital Status: Divorced   Social History Main Topics  . Smoking status: Never Smoker   . Smokeless tobacco: Never Used  . Alcohol Use: No  . Drug Use: No   Allergies  Allergen Reactions  . Aspirin     REACTION: hives  . Latex    REACTION: rash    Review of Systems     Objective:   Physical Exam  BP 100/76  Pulse 75  Temp(Src) 98.9 F (37.2 C) (Axillary)  SpO2 98%  General: normal appearance, healthy appearing, and obese.  Nose: no sinus tenderness, no discharge  Mouth: no deformity or lesions  Neck: no JVD.  Lungs: bronchial breath sounds upper lobes, no wheeze  Heart: regular rhythm, normal rate, and no murmurs.  Extremities: no clubbing, cyanosis, edema, or deformity noted  Neurologic: normal strength  Cervical Nodes: no significant adenopathy     Assessment & Plan:   ASTHMA Have given her sample of asmanex one puff at night.  She is to continue if this improves her cough.  ALLERGIC RHINITIS Seen by Dr. Jenne Pane with ENT  OBSTRUCTIVE SLEEP APNEA Hopefully improving her cough will help improve CPAP use.  Sarcoidosis If her cough does not improve with asthma therapy, then will need to repeat chest xray.  Updated Medication List Outpatient Encounter Prescriptions as of 09/02/2011  Medication Sig Dispense Refill  . albuterol (PROVENTIL HFA;VENTOLIN HFA) 108 (90 BASE) MCG/ACT inhaler Inhale 2 puffs into the lungs 4 (four) times daily as needed.  1 Inhaler  3  .  albuterol (PROVENTIL) (2.5 MG/3ML) 0.083% nebulizer solution Take 2.5 mg by nebulization every 6 (six) hours as needed.        [name] [name] HCT 40-25 MG per tablet TAKE 1 TABLET DAILY.  30 tablet  5  . BYSTOLIC 5 MG tablet TAKE 1 TABLET BY MOUTH EVERY DAY  30 tablet  7  . darifenacin (ENABLEX) 7.5 MG 24 hr tablet Take 15 mg by mouth daily.       [name] dexlansoprazole (DEXILANT) 60 MG capsule Take 60 mg by mouth daily.        . montelukast (SINGULAIR) 10 MG tablet Take 1 tablet (10 mg total) by mouth at bedtime.  30 tablet  6  . ALPRAZolam (XANAX) 1 MG tablet Take 1 mg by mouth 2 (two) times daily as needed.        . budesonide (PULMICORT) 0.5 MG/2ML nebulizer solution Take 2 mLs (0.5 mg total) by nebulization 2 (two) times daily as needed.  60 mL   11  . Cholecalciferol (VITAMIN D) 2000 UNITS tablet Take 1,000 Units by mouth daily.       [name] CORAL CALCIUM PO Take by mouth daily.        . fluticasone (FLONASE) 50 MCG/ACT nasal spray 2 sprays by Nasal route daily.        . Milnacipran HCl 25 MG TABS Take 1 tablet (25 mg total) by mouth daily.  30 tablet  3  . Misc Natural Products (GLUCOSAMINE CHONDROITIN ADV PO) Take 2 tablets by mouth daily.        . mometasone (ASMANEX 14 METERED DOSES) 220 MCG/INH inhaler Inhale 1 puff into the lungs at bedtime.  1 Inhaler  12  . polyethylene glycol (MIRALAX / GLYCOLAX) packet Take 17 g by mouth daily.        . vitamin B-12 (CYANOCOBALAMIN) 1000 MCG tablet Take 1,000 mcg by mouth daily.

## 2011-09-03 ENCOUNTER — Telehealth: Payer: Self-pay | Admitting: *Deleted

## 2011-09-03 MED ORDER — PHENTERMINE HCL 37.5 MG PO CAPS: 37.5000 mg | ORAL_CAPSULE | ORAL | Status: DC

## 2011-09-03 NOTE — Telephone Encounter (Signed)
Called pt no answer LMOM rx ready for pick-up...09/03/11@11 :55am/LMB

## 2011-09-03 NOTE — Telephone Encounter (Signed)
Pt left walk-in sheet requesting refill on her phentermine. Is this ok?

## 2011-09-03 NOTE — Telephone Encounter (Signed)
yes

## 2011-09-06 ENCOUNTER — Telehealth: Payer: Self-pay | Admitting: *Deleted

## 2011-09-06 NOTE — Telephone Encounter (Signed)
Left ms requesting call back checking on status of renewal form on pt diabetic supplies. Has fax several times. Please use ref # [id number].......09/06/11@10 :54am/LMB

## 2011-09-06 NOTE — Telephone Encounter (Signed)
Form completed.

## 2011-09-06 NOTE — Telephone Encounter (Signed)
Faxed form back to diabetes care @ [phone]...09/06/11@1 :28pm/LMB

## 2011-09-06 NOTE — Telephone Encounter (Signed)
Called [facility] back spoke with Jean Rosenthal gave correct fax # resending form to be completed

## 2011-09-08 ENCOUNTER — Encounter: Payer: Self-pay | Admitting: Gastroenterology

## 2011-09-08 ENCOUNTER — Ambulatory Visit (INDEPENDENT_AMBULATORY_CARE_PROVIDER_SITE_OTHER): Payer: Medicare Other | Admitting: Gastroenterology

## 2011-09-08 VITALS — BP 122/88 | HR 93 | Ht 65.0 in | Wt 254.9 lb

## 2011-09-08 DIAGNOSIS — K5900 Constipation, unspecified: Secondary | ICD-10-CM

## 2011-09-08 DIAGNOSIS — K219 Gastro-esophageal reflux disease without esophagitis: Secondary | ICD-10-CM

## 2011-09-08 DIAGNOSIS — R109 Unspecified abdominal pain: Secondary | ICD-10-CM

## 2011-09-08 NOTE — Progress Notes (Signed)
History of Present Illness: This is a  60 year old female returning for followup of constipation associated with lower abdominal pain and occasional burning. Records were received from [facility] showing that she underwent upper endoscopy and flexible sigmoidoscopy in March 2010. We did not receive biopsy reports from those procedures. 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 She states a colonoscopy was performed about 5 or 8 years prior to that. She states she has a very difficult time with venous access and has had lines placed the last few times she is need of intravenous access.  Current Medications, Allergies, Past Medical History, Past Surgical History, Family History and Social History were reviewed in [employer] record.  Physical Exam: General: Well developed , well nourished, obese, no acute distress Head: Normocephalic and atraumatic Eyes:  sclerae anicteric, EOMI Ears: Normal auditory acuity Mouth: No deformity or lesions Lungs: Clear throughout to auscultation Heart: Regular rate and rhythm; no murmurs, rubs or bruits Abdomen: Soft, non tender and non distended. No masses, hepatosplenomegaly or hernias noted. Normal Bowel sounds Rectal: Deferred to colonoscopy Musculoskeletal: Symmetrical with no gross deformities  Pulses:  Normal pulses noted Extremities: No clubbing, cyanosis, edema or deformities noted Neurological: Alert oriented x 4, grossly nonfocal Psychological:  Alert and cooperative. Normal mood and affect  Assessment and Recommendations:  1. Chronic constipation associated with lower, pain and burning. Continue MiraLax 3 times a day. Need to exclude colorectal lesions. Schedule colonoscopy. Coordinate with interventional radiology for placement of a deep IV or PICC line prior to the colonoscopy. This will need to be performed at the  hospital. The risks, benefits, and alternatives to colonoscopy with possible biopsy and possible polypectomy were discussed with the patient and they consent to proceed.   2. GERD. Continue Dexlansoprazole 60 mg daily and standard antireflux measures.

## 2011-09-08 NOTE — Patient Instructions (Addendum)
We will contact you regarding scheduling your Colonoscopy.  cc: Rene Paci, MD

## 2011-09-09 ENCOUNTER — Other Ambulatory Visit: Payer: Self-pay | Admitting: Gastroenterology

## 2011-09-09 ENCOUNTER — Telehealth: Payer: Self-pay

## 2011-09-09 ENCOUNTER — Other Ambulatory Visit: Payer: Self-pay

## 2011-09-09 DIAGNOSIS — K5900 Constipation, unspecified: Secondary | ICD-10-CM

## 2011-09-09 DIAGNOSIS — R109 Unspecified abdominal pain: Secondary | ICD-10-CM

## 2011-09-09 MED ORDER — NA SULFATE-K SULFATE-MG SULF 17.5-3.13-1.6 GM/177ML PO SOLN: 1.0000 | Freq: Every day | ORAL | Status: DC

## 2011-09-09 NOTE — Telephone Encounter (Signed)
Spoke with patient and informed her of the appointment date and times if Colonoscopy and IR. Pt will come in next week to get her instructions for her Colonoscopy.

## 2011-09-09 NOTE — Telephone Encounter (Signed)
Scheduled patients Colonoscopy with propofol on 09/28/11 at 12:30pm. Also scheduled the ultrasound guided venous access on 09/28/11 at 11:00am arriving at 10:45am.

## 2011-09-20 ENCOUNTER — Other Ambulatory Visit: Payer: Self-pay | Admitting: Obstetrics and Gynecology

## 2011-09-20 DIAGNOSIS — Z1231 Encounter for screening mammogram for malignant neoplasm of breast: Secondary | ICD-10-CM

## 2011-09-28 ENCOUNTER — Ambulatory Visit (HOSPITAL_COMMUNITY)
Admission: RE | Admit: 2011-09-28 | Discharge: 2011-09-28 | Disposition: A | Discharge status: Home / Self Care | Payer: Medicare Other | Source: Ambulatory Visit | Attending: Gastroenterology | Admitting: Gastroenterology

## 2011-09-28 ENCOUNTER — Other Ambulatory Visit: Payer: Medicare Other | Admitting: Gastroenterology

## 2011-09-28 DIAGNOSIS — E119 Type 2 diabetes mellitus without complications: Secondary | ICD-10-CM | POA: Insufficient documentation

## 2011-09-28 DIAGNOSIS — K5730 Diverticulosis of large intestine without perforation or abscess without bleeding: Secondary | ICD-10-CM | POA: Insufficient documentation

## 2011-09-28 DIAGNOSIS — K219 Gastro-esophageal reflux disease without esophagitis: Secondary | ICD-10-CM | POA: Insufficient documentation

## 2011-09-28 DIAGNOSIS — R109 Unspecified abdominal pain: Secondary | ICD-10-CM

## 2011-09-28 DIAGNOSIS — K5900 Constipation, unspecified: Secondary | ICD-10-CM | POA: Insufficient documentation

## 2011-09-28 DIAGNOSIS — G4733 Obstructive sleep apnea (adult) (pediatric): Secondary | ICD-10-CM | POA: Insufficient documentation

## 2011-09-28 DIAGNOSIS — D869 Sarcoidosis, unspecified: Secondary | ICD-10-CM | POA: Insufficient documentation

## 2011-09-28 DIAGNOSIS — E669 Obesity, unspecified: Secondary | ICD-10-CM | POA: Insufficient documentation

## 2011-09-28 DIAGNOSIS — J45909 Unspecified asthma, uncomplicated: Secondary | ICD-10-CM | POA: Insufficient documentation

## 2011-09-28 DIAGNOSIS — I10 Essential (primary) hypertension: Secondary | ICD-10-CM | POA: Insufficient documentation

## 2011-09-28 LAB — GLUCOSE, CAPILLARY: Glucose-Capillary: 122 mg/dL — ABNORMAL HIGH (ref 70–99)

## 2011-09-29 NOTE — Progress Notes (Unsigned)
This encounter was created in error - please disregard.

## 2011-09-30 ENCOUNTER — Ambulatory Visit: Payer: Medicare Other | Admitting: Internal Medicine

## 2011-09-30 ENCOUNTER — Telehealth: Payer: Self-pay | Admitting: Gastroenterology

## 2011-09-30 MED ORDER — POLYETHYLENE GLYCOL 3350 17 G PO PACK: 17.0000 g | PACK | Freq: Every day | ORAL | Status: DC

## 2011-09-30 NOTE — Telephone Encounter (Signed)
Patient was asking about the findings on the colon.  I have reviewed it with her and mailed her a copy

## 2011-10-05 ENCOUNTER — Institutional Professional Consult (permissible substitution): Payer: Medicare Other | Admitting: Cardiology

## 2011-10-06 ENCOUNTER — Other Ambulatory Visit (INDEPENDENT_AMBULATORY_CARE_PROVIDER_SITE_OTHER): Payer: Medicare Other

## 2011-10-06 ENCOUNTER — Other Ambulatory Visit: Payer: Self-pay | Admitting: Gastroenterology

## 2011-10-06 ENCOUNTER — Ambulatory Visit (INDEPENDENT_AMBULATORY_CARE_PROVIDER_SITE_OTHER): Payer: Medicare Other | Admitting: Internal Medicine

## 2011-10-06 ENCOUNTER — Other Ambulatory Visit: Payer: Self-pay | Admitting: Internal Medicine

## 2011-10-06 ENCOUNTER — Encounter: Payer: Self-pay | Admitting: Internal Medicine

## 2011-10-06 VITALS — BP 112/80 | HR 92 | Temp 98.3°F | Wt 250.1 lb

## 2011-10-06 DIAGNOSIS — Z23 Encounter for immunization: Secondary | ICD-10-CM

## 2011-10-06 DIAGNOSIS — E119 Type 2 diabetes mellitus without complications: Secondary | ICD-10-CM

## 2011-10-06 DIAGNOSIS — E538 Deficiency of other specified B group vitamins: Secondary | ICD-10-CM

## 2011-10-06 DIAGNOSIS — I10 Essential (primary) hypertension: Secondary | ICD-10-CM

## 2011-10-06 LAB — HEMOGLOBIN A1C: Hgb A1c MFr Bld: 5.6 % (ref 4.6–6.5)

## 2011-10-06 MED ORDER — ALPRAZOLAM 1 MG PO TABS: 1.0000 mg | ORAL_TABLET | Freq: Two times a day (BID) | ORAL | Status: DC | PRN

## 2011-10-06 MED ORDER — PHENTERMINE HCL 37.5 MG PO CAPS: 37.5000 mg | ORAL_CAPSULE | ORAL | Status: DC

## 2011-10-06 MED ORDER — CYANOCOBALAMIN 1000 MCG/ML IJ SOLN
1000.0000 ug | Freq: Once | INTRAMUSCULAR | Status: AC
  Administered 2011-10-06: 1000 ug via INTRAMUSCULAR

## 2011-10-06 NOTE — Assessment & Plan Note (Signed)
Diet controlled - Lab Results  Component Value Date   HGBA1C 5.9 06/30/2011

## 2011-10-06 NOTE — Assessment & Plan Note (Signed)
BP Readings from Last 3 Encounters:  10/06/11 112/80  09/08/11 122/88  09/02/11 100/76   The current medical regimen is effective;  continue present plan and medications.

## 2011-10-06 NOTE — Assessment & Plan Note (Signed)
Wt Readings from Last 3 Encounters:  10/06/11 250 lb 1.9 oz (113.454 kg)  09/08/11 254 lb 14.4 oz (115.622 kg)  06/30/11 256 lb 1.9 oz (116.175 kg)   Using phentermine - no adverse side effects from same Will refill today but i do not recommended more than 3 months consecutive use of same  - pt understands and agrees

## 2011-10-06 NOTE — Progress Notes (Signed)
Subjective:    Patient ID: Cynthia Kirk, female    DOB: 1951-06-08, 60 y.o.   MRN: [id number]  HPI here for follow up - reviewed chronic medical issues:  Syncope events: 4 episodes over summer 2012 -  occ at rest while talking/laughing - no warning, out 3-5 sec,  now lighthead but no LOC in past 3 months Not associated with incontinence, chest pain or dizziness Pending cards eval for same -  HTN - reports compliance with ongoing medical treatment and no changes in medication dose or frequency. denies adverse side effects related to current therapy.   DM2 - diet controlled since off steroids- reports compliance with ongoing diet control - denies adverse side effects related to current therapy. checks sugars each am   gerd - reports compliance with ongoing medical treatment and no changes in medication dose or frequency. denies adverse side effects related to current therapy. no abd pain or change in bowels  ongoing obesity and inability to lose weight.  onset of obesity 1990s when dx and tx for sarcoid  reports following low fat + calorie restrcited diet  not improved by exercise 27min/d 7d/wk  prev success tx in med wt loss clinic with b12 shots and phentermine; +fh "big people"  has not attended nutrition counseling or bariatric clinic meetings      Past Medical History  Diagnosis Date  . Hiatal hernia   . Hypertension   . Type II or unspecified type diabetes mellitus without mention of complication, not stated as uncontrolled   . Morbid obesity   . Iron deficiency anemia, unspecified   . Esophageal reflux   . Allergic rhinitis, cause unspecified   . Obstructive sleep apnea (adult) (pediatric)   . Unspecified asthma   . Sarcoidosis 1996    @ [employer]  . Unspecified menopausal and postmenopausal disorder   . Intestinal infection due to Clostridium difficile   . Iron deficiency anemia   . Anxiety   . TMJ (dislocation of temporomandibular joint)     Review of Systems    Constitutional: Positive for fatigue. Negative for fever and unexpected weight change.  Cardiovascular: Negative for chest pain and palpitations.  Musculoskeletal: Positive for myalgias. Negative for joint swelling.  Neurological: Positive for syncope.       Objective:   Physical Exam  BP 112/80  Pulse 92  Temp(Src) 98.3 F (36.8 C) (Oral)  Wt 250 lb 1.9 oz (113.454 kg)  SpO2 99%  LMP 08/14/2011 Wt Readings from Last 3 Encounters:  10/06/11 250 lb 1.9 oz (113.454 kg)  09/08/11 254 lb 14.4 oz (115.622 kg)  06/30/11 256 lb 1.9 oz (116.175 kg)   Constitutional: She is overweight; appears well-developed and well-nourished. No distress.  Cardiovascular: Normal rate, regular rhythm and normal heart sounds.  No murmur heard. No BLE edema. Pulmonary/Chest: Effort normal and breath sounds normal. No respiratory distress. She has no wheezes.  Musculoskeletal: Normal range of motion, no joint effusions. No gross deformities - FROM without pain Neurological: She is alert and oriented to person, place, and time. No cranial nerve deficit. Coordination normal.  Psychiatric: She has a normal mood and affect. Her behavior is normal. Judgment and thought content normal.   Lab Results  Component Value Date   WBC 4.6 06/30/2011   HGB 12.3 06/30/2011   HCT 37.5 06/30/2011   PLT 241.0 06/30/2011   CHOL 162 09/14/2010   TRIG 73.0 09/14/2010   HDL 43.20 09/14/2010   ALT 14 06/30/2011   AST 21 06/30/2011  NA 138 06/30/2011   K 4.0 06/30/2011   CL 100 06/30/2011   CREATININE 0.8 06/30/2011   BUN 20 06/30/2011   CO2 29 06/30/2011   TSH 0.74 06/30/2011   HGBA1C 5.9 06/30/2011        Assessment & Plan:  See problem list. Medications and labs reviewed today.

## 2011-10-06 NOTE — Patient Instructions (Addendum)
It was good to see you today. Test(s) ordered today. Your results will be called to you after review (48-72hours after test completion). If any changes need to be made, you will be notified at that time. Medications reviewed, no changes at this time. Refill on medication(s) as discussed today. Flu shot today Please schedule followup in 6 months for diabetes mellitus and blood pressure check, call sooner if problems.

## 2011-10-07 ENCOUNTER — Telehealth: Payer: Self-pay | Admitting: Internal Medicine

## 2011-10-11 ENCOUNTER — Encounter: Payer: Self-pay | Admitting: Cardiology

## 2011-10-11 ENCOUNTER — Ambulatory Visit (INDEPENDENT_AMBULATORY_CARE_PROVIDER_SITE_OTHER): Payer: Medicare Other | Admitting: Cardiology

## 2011-10-11 VITALS — BP 108/64 | HR 85 | Ht 65.0 in | Wt 248.0 lb

## 2011-10-11 DIAGNOSIS — R55 Syncope and collapse: Secondary | ICD-10-CM | POA: Insufficient documentation

## 2011-10-11 NOTE — Patient Instructions (Addendum)
Your physician has requested that you have an echocardiogram. Echocardiography is a painless test that uses sound waves to create images of your heart. It provides your doctor with information about the size and shape of your heart and how well your heart's chambers and valves are working. This procedure takes approximately one hour. There are no restrictions for this procedure.   Your physician has recommended that you wear an event monitor. Event monitors are medical devices that record the heart's electrical activity. Doctors most often [place] these monitors to diagnose arrhythmias. Arrhythmias are problems with the speed or rhythm of the heartbeat. The monitor is a small, portable device. You can wear one while you do your normal daily activities. This is usually used to diagnose what is causing palpitations/syncope (passing out).  21 days  Your physician recommends that you schedule a follow-up appointment in: 4 weeks

## 2011-10-11 NOTE — Progress Notes (Signed)
HPI Patient is seen today for her new establishment of cardiac care.  There is no documented coronary artery disease.  The patient is not having any significant chest pain.  She's had some exertional shortness of breath over the years.  This has not changed.  She has a history of pulmonary sarcoid and asthma.  Her concern is the spells that she has had over the past year.  It appears that she may have had a true syncopal episode.  She has also had other episodes of presyncope.  These are unusual in that they usually occur at a time that she is laughing vigorously.  The worst episode occurred when she was with her husband at a store he said something that she thought was very funny and she began to laugh and then found herself on the ground looking up at her husband.  He thought that she had passed out.  There was no definite injury.  As noted there is a history of sarcoid.  This was made many years ago at [facility].  I do not have any of the data.  Several years ago she had palpitations.  The records are not available.  She was at another place and actually underwent what she describes as cardiac catheterization.  She was told that her arteries were okay.  She does not have documented palpitations.  In the past year she had a severe pneumonia and was hospitalized.  At that time she had one troponin value of 0.24 which was very slightly elevated.    As part of this evaluation I have reviewed a large number of prior progress notes by primary care and the pulmonary team. Allergies  Allergen Reactions  . Aspirin     REACTION: hives  . Latex     REACTION: rash    Current Outpatient Prescriptions  Medication Sig Dispense Refill  . albuterol (PROVENTIL HFA;VENTOLIN HFA) 108 (90 BASE) MCG/ACT inhaler Inhale 2 puffs into the lungs 4 (four) times daily as needed.  1 Inhaler  3  . albuterol (PROVENTIL) (2.5 MG/3ML) 0.083% nebulizer solution Take 2.5 mg by nebulization every 6 (six) hours as needed.        .  ALPRAZolam (XANAX) 1 MG tablet Take 1 tablet (1 mg total) by mouth 2 (two) times daily as needed for anxiety.  60 tablet  2  . [name] HCT 40-25 MG per tablet TAKE 1 TABLET DAILY.  30 tablet  5  . budesonide (PULMICORT) 0.5 MG/2ML nebulizer solution Take 2 mLs (0.5 mg total) by nebulization 2 (two) times daily as needed.  60 mL  11  . BYSTOLIC 5 MG tablet TAKE 1 TABLET BY MOUTH EVERY DAY  30 tablet  7  . Cholecalciferol (VITAMIN D) 2000 UNITS tablet Take 1,000 Units by mouth daily.       [name] CORAL CALCIUM PO Take by mouth daily.        [name] darifenacin (ENABLEX) 7.5 MG 24 hr tablet Take 15 mg by mouth daily.       [name] DEXILANT 60 MG capsule TAKE ONE CAPSULE BY MOUTH EVERY DAY  30 capsule  5  . fluticasone (FLONASE) 50 MCG/ACT nasal spray 2 sprays by Nasal route daily.        . Misc Natural Products (GLUCOSAMINE CHONDROITIN ADV PO) Take 2 tablets by mouth daily.        . mometasone (ASMANEX 14 METERED DOSES) 220 MCG/INH inhaler Inhale 1 puff into the lungs at bedtime.  1 Inhaler  12  . montelukast (SINGULAIR) 10 MG tablet Take 1 tablet (10 mg total) by mouth at bedtime.  30 tablet  6  . phentermine 37.5 MG capsule Take 1 capsule (37.5 mg total) by mouth every morning.  30 capsule  0  . polyethylene glycol (MIRALAX / GLYCOLAX) packet Take 17 g by mouth daily.  100 each  3  . SINGULAIR 10 MG tablet Take 1 by mouth daily      . vitamin B-12 (CYANOCOBALAMIN) 1000 MCG tablet Take 1,000 mcg by mouth daily.          History   Social History  . Marital Status: Divorced    Spouse Name: N/A    Number of Children: 2  . Years of Education: N/A   Occupational History  . retired    Social History Main Topics  . Smoking status: Never Smoker   . Smokeless tobacco: Never Used  . Alcohol Use: No  . Drug Use: No  . Sexually Active: Not on file   Other Topics Concern  . Not on file   Social History Narrative  . No narrative on file    Family History  Problem Relation Age of Onset  . Heart attack  Father   . Hypertension Father   . Diabetes Father   . Kidney disease Sister   . Osteoporosis Sister   . Asthma Daughter   . Asthma Son   . Osteoporosis Sister   . Lung cancer Maternal Uncle     Past Medical History  Diagnosis Date  . Hiatal hernia   . Hypertension   . Type II or unspecified type diabetes mellitus without mention of complication, not stated as uncontrolled   . Morbid obesity   . Iron deficiency anemia, unspecified   . Esophageal reflux   . Allergic rhinitis, cause unspecified   . Obstructive sleep apnea (adult) (pediatric)   . Unspecified asthma   . Sarcoidosis 1996    @ [employer]  . Unspecified menopausal and postmenopausal disorder   . Intestinal infection due to Clostridium difficile   . Iron deficiency anemia   . Anxiety   . TMJ (dislocation of temporomandibular joint)   . Syncope     ?? Syncope ??    Past Surgical History  Procedure Date  . Vulvectomy   . Vulvectomy for cancerous cell   . Tubal pregnancy     x's 2  . Uretha lift   . Dilation and curettage of uterus     ROS  Patient denies fever, chills, headache, sweats, rash, change in vision, change in hearing, chest pain, cough, nausea vomiting, urinary symptoms.  All of the systems are reviewed and are negative.  PHYSICAL EXAM Patient is overweight but stable.  Head is atraumatic.  There is no xanthelasma.  There is no jugular venous distention.  No carotid bruits.  Lungs are clear.  Respiratory effort is nonlabored.  Cardiac exam reveals S1 and S2.  There is no clicks or significant murmurs.  The abdomen is soft.  No peripheral edema.  There is no musculoskeletal deformities.  No skin rashes. Filed Vitals:   10/11/11 1443  BP: 108/64  Pulse: 85  Height: 5\' 5"  (1.651 m)  Weight: 248 lb (112.492 kg)    EKG is done today and reviewed by me.  She has mild inferior ST changes.  There is sinus rhythm.  This EKG is unchanged from the tracing of June 30, 2011.  ASSESSMENT & PLAN

## 2011-10-11 NOTE — Assessment & Plan Note (Signed)
The etiology of the spells that the patient is having is not clear.  Certainly the symptoms are unusual.  As outlined she has these spells when she is laughing.  It is important to note that she has sarcoidosis.  We must keep in mind the possibility of cardiac sarcoid as a possible basis or rhythm abnormalities.  We will proceed with Ace 2-D echo and a full event recorder.  I will then review this case with electrophysiology.  It is possible that we may proceed with cardiac MRI as this is currently the best way to assess for cardiac sarcoid.  When the patient was hospitalized with pneumonia she had a troponin of 0.24.  This is almost certainly from the overall illness.  She has no evidence of ischemic disease.  However a full exercise test may still be appropriate at some point.

## 2011-10-11 NOTE — Assessment & Plan Note (Signed)
Blood pressure is controlled. No change in therapy. 

## 2011-10-14 NOTE — Progress Notes (Signed)
Addended by: Reine Just on: 10/14/2011 10:34 AM   Modules accepted: Orders

## 2011-10-21 ENCOUNTER — Telehealth: Payer: Self-pay | Admitting: Pulmonary Disease

## 2011-10-21 NOTE — Telephone Encounter (Signed)
She can use OTC delsym.  She can call back if this does not help.

## 2011-10-21 NOTE — Telephone Encounter (Signed)
Called and spoke with pt.  Pt states she is still having trouble with her cough.  States she will cough up thick white sputum.  States cough is esp worse at night when lying down.  Also c/o sore throat, hoarseness, and changes in voices. States she does have  "a little" sob with exertion.  Denies tightness in chest or f/c/s.  Pt is wanting something to take for the cough to help her rest at night. Also FYI: Pt states she is seeing ENT for her hoarseness/changes in voice and they have referred her to [facility].   VS, please advise.  Thanks. Allergies  Allergen Reactions  . Aspirin     REACTION: hives  . Latex     REACTION: rash

## 2011-10-21 NOTE — Telephone Encounter (Signed)
Spoke with pt and notified of recs per VS.  She verbalized understanding.  

## 2011-10-27 ENCOUNTER — Encounter (INDEPENDENT_AMBULATORY_CARE_PROVIDER_SITE_OTHER): Payer: Medicare Other

## 2011-10-27 ENCOUNTER — Ambulatory Visit (HOSPITAL_COMMUNITY): Payer: Medicare Other | Attending: Cardiology | Admitting: Radiology

## 2011-10-27 DIAGNOSIS — D869 Sarcoidosis, unspecified: Secondary | ICD-10-CM | POA: Insufficient documentation

## 2011-10-27 DIAGNOSIS — E119 Type 2 diabetes mellitus without complications: Secondary | ICD-10-CM | POA: Insufficient documentation

## 2011-10-27 DIAGNOSIS — J45909 Unspecified asthma, uncomplicated: Secondary | ICD-10-CM | POA: Insufficient documentation

## 2011-10-27 DIAGNOSIS — R55 Syncope and collapse: Secondary | ICD-10-CM

## 2011-11-04 ENCOUNTER — Other Ambulatory Visit (HOSPITAL_COMMUNITY): Payer: Medicare Other | Admitting: Radiology

## 2011-11-05 ENCOUNTER — Ambulatory Visit: Payer: Medicare Other | Admitting: Pulmonary Disease

## 2011-11-12 ENCOUNTER — Telehealth: Payer: Self-pay | Admitting: Cardiology

## 2011-11-12 NOTE — Telephone Encounter (Signed)
New Msg: Pt calling upset b/c pt appt had to be moved from December 14th appt. Pt wants to be seen before the year is over. Please return pt call to discuss further.

## 2011-11-12 NOTE — Telephone Encounter (Signed)
Pt rescheduled to 11/23/2011.

## 2011-11-15 ENCOUNTER — Ambulatory Visit: Payer: Medicare Other | Admitting: Pulmonary Disease

## 2011-11-16 ENCOUNTER — Ambulatory Visit: Payer: Medicare Other

## 2011-11-19 ENCOUNTER — Ambulatory Visit: Payer: Medicare Other | Admitting: Cardiology

## 2011-11-23 ENCOUNTER — Encounter: Payer: Self-pay | Admitting: Cardiology

## 2011-11-23 ENCOUNTER — Ambulatory Visit (INDEPENDENT_AMBULATORY_CARE_PROVIDER_SITE_OTHER): Payer: Medicare Other | Admitting: Cardiology

## 2011-11-23 DIAGNOSIS — D869 Sarcoidosis, unspecified: Secondary | ICD-10-CM

## 2011-11-23 DIAGNOSIS — R55 Syncope and collapse: Secondary | ICD-10-CM

## 2011-11-23 DIAGNOSIS — R9430 Abnormal result of cardiovascular function study, unspecified: Secondary | ICD-10-CM

## 2011-11-23 DIAGNOSIS — IMO0002 Reserved for concepts with insufficient information to code with codable children: Secondary | ICD-10-CM | POA: Insufficient documentation

## 2011-11-23 DIAGNOSIS — R0989 Other specified symptoms and signs involving the circulatory and respiratory systems: Secondary | ICD-10-CM

## 2011-11-23 DIAGNOSIS — I519 Heart disease, unspecified: Secondary | ICD-10-CM

## 2011-11-23 NOTE — Assessment & Plan Note (Signed)
The patient has had no recurrent syncope. I explained to her that she may well have laughing syncope. However she does have mild decrease in LV function. The first step will be to proceed with a stress nuclear scan to rule out the possibility of ischemic disease.

## 2011-11-23 NOTE — Patient Instructions (Signed)
Your physician recommends that you schedule a follow-up appointment in: 5 weeks'  Your physician has requested that you have a lexiscan myoview. For further information please visit [email or web address] Please follow instruction sheet, as given.

## 2011-11-23 NOTE — Progress Notes (Signed)
HPI  The patient is seen for followup evaluation of syncope. There is a history in the past of sarcoid. She has never been fully evaluated for the possibility of cardiac sarcoid. She had a syncopal episode. This occurred in association with very dramatic laughing. I suspect she may well have laughing syncope. She wore an event recorder and had no recurrent symptoms. She had normal sinus rhythm with no tachycardia arrhythmias. She had a two-dimensional echo showing an ejection fraction of 45-50%. There is no known coronary artery disease.  Allergies  Allergen Reactions  . Aspirin     REACTION: hives  . Latex     REACTION: rash    Current Outpatient Prescriptions  Medication Sig Dispense Refill  . albuterol (PROVENTIL HFA;VENTOLIN HFA) 108 (90 BASE) MCG/ACT inhaler Inhale 2 puffs into the lungs 4 (four) times daily as needed.  1 Inhaler  3  . albuterol (PROVENTIL) (2.5 MG/3ML) 0.083% nebulizer solution Take 2.5 mg by nebulization every 6 (six) hours as needed.        . ALPRAZolam (XANAX) 1 MG tablet Take 1 tablet (1 mg total) by mouth 2 (two) times daily as needed for anxiety.  60 tablet  2  . [name] HCT 40-25 MG per tablet TAKE 1 TABLET DAILY.  30 tablet  5  . budesonide (PULMICORT) 0.5 MG/2ML nebulizer solution Take 2 mLs (0.5 mg total) by nebulization 2 (two) times daily as needed.  60 mL  11  . BYSTOLIC 5 MG tablet TAKE 1 TABLET BY MOUTH EVERY DAY  30 tablet  7  . Cholecalciferol (VITAMIN D) 2000 UNITS tablet Take 1,000 Units by mouth daily.       [name] CORAL CALCIUM PO Take by mouth daily.        [name] darifenacin (ENABLEX) 7.5 MG 24 hr tablet Take 15 mg by mouth daily.       [name] DEXILANT 60 MG capsule TAKE ONE CAPSULE BY MOUTH EVERY DAY  30 capsule  5  . fluticasone (FLONASE) 50 MCG/ACT nasal spray 2 sprays by Nasal route daily.        . Misc Natural Products (GLUCOSAMINE CHONDROITIN ADV PO) Take 2 tablets by mouth daily.        . mometasone (ASMANEX 14 METERED DOSES) 220 MCG/INH inhaler Inhale 1  puff into the lungs at bedtime.  1 Inhaler  12  . montelukast (SINGULAIR) 10 MG tablet Take 1 tablet (10 mg total) by mouth at bedtime.  30 tablet  6  . polyethylene glycol (MIRALAX / GLYCOLAX) packet Take 17 g by mouth daily.  100 each  3  . SINGULAIR 10 MG tablet Take 1 by mouth daily      . vitamin B-12 (CYANOCOBALAMIN) 1000 MCG tablet Take 1,000 mcg by mouth daily.          History   Social History  . Marital Status: Divorced    Spouse Name: N/A    Number of Children: 2  . Years of Education: N/A   Occupational History  . retired    Social History Main Topics  . Smoking status: Never Smoker   . Smokeless tobacco: Never Used  . Alcohol Use: No  . Drug Use: No  . Sexually Active: Not on file   Other Topics Concern  . Not on file   Social History Narrative  . No narrative on file    Family History  Problem Relation Age of Onset  . Heart attack Father   . Hypertension Father   .  Diabetes Father   . Kidney disease Sister   . Osteoporosis Sister   . Asthma Daughter   . Asthma Son   . Osteoporosis Sister   . Lung cancer Maternal Uncle     Past Medical History  Diagnosis Date  . Hiatal hernia   . Hypertension   . Type II or unspecified type diabetes mellitus without mention of complication, not stated as uncontrolled   . Morbid obesity   . Iron deficiency anemia, unspecified   . Esophageal reflux   . Allergic rhinitis, cause unspecified   . Obstructive sleep apnea (adult) (pediatric)   . Unspecified asthma   . Sarcoidosis 1996    @ [employer]  . Unspecified menopausal and postmenopausal disorder   . Intestinal infection due to Clostridium difficile   . Iron deficiency anemia   . Anxiety   . TMJ (dislocation of temporomandibular joint)   . Syncope     ?? Syncope ??  . Ejection fraction     EF 45-50%, echo, October 27, 2011    Past Surgical History  Procedure Date  . Vulvectomy   . Vulvectomy for cancerous cell   . Tubal pregnancy     x's 2  . Uretha  lift   . Dilation and curettage of uterus     ROS   The patient denies fever, chills, headache, sweats, rash,Change in vision, change in hearing, chest pain, cough, nausea vomiting, urinary symptoms. All other systems are reviewed and are negative.  PHYSICAL EXAM  Patient is oriented to person time and place. Affect is normal. There is no jugulovenous distention. Lungs are clear. Respiratory effort is not labored. Cardiac exam reveals an S1 and S2. There are no clicks or significant murmurs. The abdomen is soft. There is no peripheral edema.  Filed Vitals:   11/23/11 1106  BP: 118/74  Pulse: 76  Resp: 12  Height: 5\' 5"  (1.651 m)  Weight: 248 lb (112.492 kg)    ASSESSMENT & PLAN

## 2011-11-23 NOTE — Assessment & Plan Note (Signed)
The patient has no signs of clinical heart failure. No change in medicines at this time. Nuclear stress study will be done to rule out ischemia

## 2011-11-23 NOTE — Assessment & Plan Note (Signed)
At this time I will still keep the possibility of an MRI to assess for cardiac sarcoidosis in mind.

## 2011-11-25 ENCOUNTER — Other Ambulatory Visit: Payer: Medicare Other

## 2011-11-25 ENCOUNTER — Ambulatory Visit: Payer: Medicare Other | Admitting: Cardiology

## 2011-12-01 ENCOUNTER — Other Ambulatory Visit: Payer: Self-pay | Admitting: Internal Medicine

## 2011-12-01 ENCOUNTER — Other Ambulatory Visit: Payer: Self-pay | Admitting: Pulmonary Disease

## 2011-12-04 ENCOUNTER — Other Ambulatory Visit: Payer: Self-pay | Admitting: Internal Medicine

## 2011-12-28 ENCOUNTER — Ambulatory Visit: Payer: Medicare Other

## 2011-12-29 ENCOUNTER — Telehealth: Payer: Self-pay | Admitting: *Deleted

## 2011-12-29 NOTE — Telephone Encounter (Signed)
Cynthia Kirk call and canceled her myoview scheduled at [facility] 12/31/11 @ 9am. Patient is out-of-town. She call back to reschedule.

## 2011-12-31 ENCOUNTER — Inpatient Hospital Stay (HOSPITAL_COMMUNITY): Admission: RE | Admit: 2011-12-31 | Payer: Medicare Other | Source: Ambulatory Visit

## 2011-12-31 ENCOUNTER — Other Ambulatory Visit (HOSPITAL_COMMUNITY): Payer: Medicare Other

## 2012-01-03 ENCOUNTER — Ambulatory Visit: Payer: Medicare Other

## 2012-01-10 ENCOUNTER — Ambulatory Visit: Payer: Medicare Other | Admitting: Pulmonary Disease

## 2012-01-10 ENCOUNTER — Ambulatory Visit: Payer: Medicare Other | Admitting: Cardiology

## 2012-01-14 ENCOUNTER — Ambulatory Visit: Payer: Medicare Other | Admitting: Cardiology

## 2012-01-17 ENCOUNTER — Ambulatory Visit: Payer: Medicare Other

## 2012-01-20 ENCOUNTER — Other Ambulatory Visit: Payer: Self-pay | Admitting: Pulmonary Disease

## 2012-01-20 ENCOUNTER — Other Ambulatory Visit: Payer: Self-pay | Admitting: Gastroenterology

## 2012-01-20 ENCOUNTER — Other Ambulatory Visit: Payer: Self-pay | Admitting: Internal Medicine

## 2012-01-27 ENCOUNTER — Ambulatory Visit: Payer: Medicare Other

## 2012-01-31 ENCOUNTER — Other Ambulatory Visit: Payer: Self-pay | Admitting: Pulmonary Disease

## 2012-02-10 ENCOUNTER — Ambulatory Visit: Payer: Medicare Other | Admitting: Pulmonary Disease

## 2012-02-17 DIAGNOSIS — G894 Chronic pain syndrome: Secondary | ICD-10-CM | POA: Diagnosis not present

## 2012-02-17 DIAGNOSIS — M5137 Other intervertebral disc degeneration, lumbosacral region: Secondary | ICD-10-CM | POA: Diagnosis not present

## 2012-03-02 ENCOUNTER — Ambulatory Visit (INDEPENDENT_AMBULATORY_CARE_PROVIDER_SITE_OTHER): Payer: Medicare Other | Admitting: Internal Medicine

## 2012-03-02 ENCOUNTER — Encounter: Payer: Self-pay | Admitting: Internal Medicine

## 2012-03-02 ENCOUNTER — Telehealth: Payer: Self-pay | Admitting: *Deleted

## 2012-03-02 VITALS — BP 110/62 | HR 62 | Temp 97.8°F | Ht 65.0 in | Wt 241.8 lb

## 2012-03-02 DIAGNOSIS — E119 Type 2 diabetes mellitus without complications: Secondary | ICD-10-CM

## 2012-03-02 DIAGNOSIS — R197 Diarrhea, unspecified: Secondary | ICD-10-CM | POA: Diagnosis not present

## 2012-03-02 DIAGNOSIS — I10 Essential (primary) hypertension: Secondary | ICD-10-CM | POA: Diagnosis not present

## 2012-03-02 MED ORDER — ALIGN PO CAPS: 1.0000 | ORAL_CAPSULE | Freq: Every day | ORAL | Status: DC

## 2012-03-02 MED ORDER — DIPHENOXYLATE-ATROPINE 2.5-0.025 MG PO TABS: 1.0000 | ORAL_TABLET | Freq: Four times a day (QID) | ORAL | Status: AC | PRN

## 2012-03-02 MED ORDER — HYDROCORTISONE ACE-PRAMOXINE 1-1 % RE CREA: TOPICAL_CREAM | Freq: Two times a day (BID) | RECTAL | Status: AC

## 2012-03-02 NOTE — Assessment & Plan Note (Signed)
Diet controlled - Lab Results  Component Value Date   HGBA1C 5.6 10/06/2011

## 2012-03-02 NOTE — Telephone Encounter (Signed)
analapram cream - erx done

## 2012-03-02 NOTE — Telephone Encounter (Signed)
Called pt no answer LMOM cream sent to [facility]... 03/02/12@1 :41pm/LMB

## 2012-03-02 NOTE — Telephone Encounter (Signed)
Pt states md forgot to giver her some cream for her irritated bottom due to diarrhea. Pls send to [facility]... 03/02/12@12 :53pm/LMb

## 2012-03-02 NOTE — Assessment & Plan Note (Signed)
BP Readings from Last 3 Encounters:  03/02/12 110/62  11/23/11 118/74  10/11/11 108/64   The current medical regimen is effective;  continue present plan and medications.

## 2012-03-02 NOTE — Patient Instructions (Signed)
It was good to see you today. Use lomotil as needed for diarrhea - Your prescription(s) have been given to you to submit to your pharmacy. Please take as directed and contact our office if you believe you are having problem(s) with the medication(s). Also resume Align daily for 2 weeks and use GasX as needed for gas symptoms - Call if symptoms unimproved on this treatment in next 10-14 days, sooner if worse Please schedule followup in 3-4 months for diabetes check, call sooner if problems.

## 2012-03-02 NOTE — Progress Notes (Signed)
Subjective:    Patient ID: Cynthia Kirk, female    DOB: January 08, 1951, 61 y.o.   MRN: [id number]  Diarrhea  This is a new problem. The current episode started in the past 7 days. The problem occurs less than 2 times per day. The problem has been gradually improving. Diarrhea characteristics: soft and unformed, but no blood, liquid or mucus. The patient states that diarrhea does not awaken her from sleep. Associated symptoms include abdominal pain, bloating and increased flatus. Pertinent negatives include no chills, coughing, fever, myalgias, URI, vomiting or weight loss. The symptoms are aggravated by stress. Risk factors include no known risk factors. She has tried nothing for the symptoms. There is no history of bowel resection, malabsorption or a recent abdominal surgery.   also reviewed chronic medical issues:  HTN - reports compliance with ongoing medical treatment and no changes in medication dose or frequency. denies adverse side effects related to current therapy.   DM2 - diet controlled since off steroids- reports compliance with ongoing diet control - denies adverse side effects related to current therapy. checks sugars each am   gerd - reports compliance with ongoing medical treatment and no changes in medication dose or frequency. denies adverse side effects related to current therapy. No abdominal pain   obesity -continued "inability" to lose weight.  onset of obesity 1990s when dx and tx for sarcoid  reports following low fat + calorie restrcited diet  not improved by exercise 93min/d 7d/wk  prev success tx in med wt loss clinic with b12 shots and phentermine; +fh "big people"  has not attended nutrition counseling or bariatric clinic meetings   Syncope events: 4 episodes over summer 2012 -  occ at rest while talking/laughing - no warning, out 3-5 sec,  no LOC in past 9-10 months Not associated with incontinence, chest pain or dizziness s/p cards eval for same 12/12 - mild  decrease in LV fx on echo-   Past Medical History  Diagnosis Date  . Hiatal hernia   . Hypertension   . Type II or unspecified type diabetes mellitus without mention of complication, not stated as uncontrolled   . Morbid obesity   . Iron deficiency anemia, unspecified   . Esophageal reflux   . Allergic rhinitis, cause unspecified   . Obstructive sleep apnea (adult) (pediatric)   . Unspecified asthma   . Sarcoidosis 1996    @ [employer]  . Unspecified menopausal and postmenopausal disorder   . Intestinal infection due to Clostridium difficile   . Iron deficiency anemia   . Anxiety   . TMJ (dislocation of temporomandibular joint)   . Syncope     ?? Syncope ??  . Ejection fraction     EF 45-50%, echo, October 27, 2011    Review of Systems  Constitutional: Positive for fatigue. Negative for fever, chills, weight loss and unexpected weight change.  Respiratory: Negative for cough.   Cardiovascular: Negative for chest pain and palpitations.  Gastrointestinal: Positive for abdominal pain, diarrhea, bloating and flatus. Negative for vomiting.  Musculoskeletal: Negative for myalgias and joint swelling. Back pain: chronic.       Objective:   Physical Exam  BP 110/62  Pulse 62  Temp(Src) 97.8 F (36.6 C) (Oral)  Ht 5\' 5"  (1.651 m)  Wt 241 lb 12.8 oz (109.68 kg)  BMI 40.24 kg/m2  SpO2 94% Wt Readings from Last 3 Encounters:  03/02/12 241 lb 12.8 oz (109.68 kg)  11/23/11 248 lb (112.492 kg)  10/11/11 248 lb (  112.492 kg)   Constitutional: She is overweight; appears well-developed and well-nourished. No distress.  Cardiovascular: Normal rate, regular rhythm and normal heart sounds.  No murmur heard. No BLE edema. Pulmonary/Chest: Effort normal and breath sounds normal. No respiratory distress. She has no wheezes.  Abdomen: obese, S NT, ND, +BS Psychiatric: She has a normal mood and affect. Her behavior is normal. Judgment and thought content normal.   Lab Results  Component  Value Date   WBC 4.6 06/30/2011   HGB 12.3 06/30/2011   HCT 37.5 06/30/2011   PLT 241.0 06/30/2011   CHOL 162 09/14/2010   TRIG 73.0 09/14/2010   HDL 43.20 09/14/2010   ALT 14 06/30/2011   AST 21 06/30/2011   NA 138 06/30/2011   K 4.0 06/30/2011   CL 100 06/30/2011   CREATININE 0.8 06/30/2011   BUN 20 06/30/2011   CO2 29 06/30/2011   TSH 0.74 06/30/2011   HGBA1C 5.6 10/06/2011        Assessment & Plan:  Diarrhea - acute, onset 4-5 days ago -  Slightly improved since onset Hx and exam benign - will hold empiric antibiotics as no fever, mild severity of symptoms   recommended lomotil prn, probiotic x 1 week, and Gas X prn

## 2012-03-10 ENCOUNTER — Other Ambulatory Visit: Payer: Self-pay

## 2012-03-10 MED ORDER — FLUCONAZOLE 150 MG PO TABS: 150.0000 mg | ORAL_TABLET | Freq: Once | ORAL | Status: AC

## 2012-03-10 NOTE — Telephone Encounter (Signed)
Pt advised of Rx/pharmacy 

## 2012-03-10 NOTE — Telephone Encounter (Signed)
ok 

## 2012-03-10 NOTE — Telephone Encounter (Signed)
Pt called requesting Rx for Diflucan, okay to send?

## 2012-03-13 ENCOUNTER — Telehealth: Payer: Self-pay | Admitting: Pulmonary Disease

## 2012-03-13 MED ORDER — MONTELUKAST SODIUM 10 MG PO TABS: 10.0000 mg | ORAL_TABLET | Freq: Every day | ORAL | Status: DC

## 2012-03-13 NOTE — Telephone Encounter (Signed)
Spoke with patient-she is aware that I have sent RX refill as request to [facility] [phone] with 1 refill as patient will not be back in [place] until June to follow up with [facility]; patient will call back to schedule appt once Junes schedule is out.

## 2012-03-14 ENCOUNTER — Ambulatory Visit: Payer: Medicare Other | Admitting: Pulmonary Disease

## 2012-03-28 ENCOUNTER — Other Ambulatory Visit: Payer: Self-pay | Admitting: Gastroenterology

## 2012-03-28 ENCOUNTER — Other Ambulatory Visit: Payer: Self-pay | Admitting: Internal Medicine

## 2012-03-29 ENCOUNTER — Ambulatory Visit: Payer: Medicare Other

## 2012-04-01 DIAGNOSIS — H40019 Open angle with borderline findings, low risk, unspecified eye: Secondary | ICD-10-CM | POA: Diagnosis not present

## 2012-04-01 DIAGNOSIS — H25019 Cortical age-related cataract, unspecified eye: Secondary | ICD-10-CM | POA: Diagnosis not present

## 2012-04-01 DIAGNOSIS — H524 Presbyopia: Secondary | ICD-10-CM | POA: Diagnosis not present

## 2012-04-01 DIAGNOSIS — H2510 Age-related nuclear cataract, unspecified eye: Secondary | ICD-10-CM | POA: Diagnosis not present

## 2012-04-05 ENCOUNTER — Ambulatory Visit: Payer: Medicare Other | Admitting: Internal Medicine

## 2012-04-18 ENCOUNTER — Ambulatory Visit: Payer: Medicare Other | Admitting: Pulmonary Disease

## 2012-05-15 ENCOUNTER — Ambulatory Visit: Payer: Medicare Other

## 2012-05-18 ENCOUNTER — Ambulatory Visit: Payer: Medicare Other

## 2012-05-23 ENCOUNTER — Ambulatory Visit: Payer: Medicare Other | Admitting: Pulmonary Disease

## 2012-05-23 ENCOUNTER — Ambulatory Visit: Payer: Medicare Other | Admitting: Internal Medicine

## 2012-05-25 ENCOUNTER — Ambulatory Visit: Payer: Medicare Other

## 2012-05-28 ENCOUNTER — Other Ambulatory Visit: Payer: Self-pay | Admitting: Gastroenterology

## 2012-05-30 ENCOUNTER — Ambulatory Visit: Payer: Medicare Other | Admitting: Internal Medicine

## 2012-06-01 ENCOUNTER — Ambulatory Visit: Payer: Medicare Other | Admitting: Internal Medicine

## 2012-06-01 ENCOUNTER — Ambulatory Visit: Payer: Medicare Other

## 2012-06-06 ENCOUNTER — Other Ambulatory Visit: Payer: Self-pay

## 2012-06-06 MED ORDER — NEBIVOLOL HCL 5 MG PO TABS: 5.0000 mg | ORAL_TABLET | Freq: Every day | ORAL | Status: DC

## 2012-06-06 MED ORDER — DEXLANSOPRAZOLE 60 MG PO CPDR: 60.0000 mg | DELAYED_RELEASE_CAPSULE | Freq: Every day | ORAL | Status: DC

## 2012-06-20 ENCOUNTER — Ambulatory Visit (INDEPENDENT_AMBULATORY_CARE_PROVIDER_SITE_OTHER)
Admission: RE | Admit: 2012-06-20 | Discharge: 2012-06-20 | Disposition: A | Discharge status: Home / Self Care | Payer: Medicare Other | Source: Ambulatory Visit | Attending: Pulmonary Disease | Admitting: Pulmonary Disease

## 2012-06-20 ENCOUNTER — Encounter: Payer: Self-pay | Admitting: Pulmonary Disease

## 2012-06-20 ENCOUNTER — Encounter: Payer: Self-pay | Admitting: Internal Medicine

## 2012-06-20 ENCOUNTER — Other Ambulatory Visit (INDEPENDENT_AMBULATORY_CARE_PROVIDER_SITE_OTHER): Payer: Medicare Other

## 2012-06-20 ENCOUNTER — Ambulatory Visit (INDEPENDENT_AMBULATORY_CARE_PROVIDER_SITE_OTHER): Payer: Medicare Other | Admitting: Pulmonary Disease

## 2012-06-20 ENCOUNTER — Ambulatory Visit (INDEPENDENT_AMBULATORY_CARE_PROVIDER_SITE_OTHER): Payer: Medicare Other | Admitting: Internal Medicine

## 2012-06-20 VITALS — BP 118/70 | HR 80 | Temp 98.1°F | Ht 64.0 in | Wt 253.2 lb

## 2012-06-20 VITALS — BP 120/82 | HR 77 | Temp 98.4°F | Ht 65.0 in | Wt 252.2 lb

## 2012-06-20 DIAGNOSIS — J45909 Unspecified asthma, uncomplicated: Secondary | ICD-10-CM

## 2012-06-20 DIAGNOSIS — J309 Allergic rhinitis, unspecified: Secondary | ICD-10-CM

## 2012-06-20 DIAGNOSIS — E119 Type 2 diabetes mellitus without complications: Secondary | ICD-10-CM | POA: Diagnosis not present

## 2012-06-20 DIAGNOSIS — R5381 Other malaise: Secondary | ICD-10-CM

## 2012-06-20 DIAGNOSIS — G4733 Obstructive sleep apnea (adult) (pediatric): Secondary | ICD-10-CM | POA: Diagnosis not present

## 2012-06-20 DIAGNOSIS — R5383 Other fatigue: Secondary | ICD-10-CM

## 2012-06-20 DIAGNOSIS — I10 Essential (primary) hypertension: Secondary | ICD-10-CM | POA: Diagnosis not present

## 2012-06-20 DIAGNOSIS — D869 Sarcoidosis, unspecified: Secondary | ICD-10-CM

## 2012-06-20 DIAGNOSIS — R918 Other nonspecific abnormal finding of lung field: Secondary | ICD-10-CM | POA: Diagnosis not present

## 2012-06-20 DIAGNOSIS — L299 Pruritus, unspecified: Secondary | ICD-10-CM

## 2012-06-20 LAB — LIPID PANEL
Cholesterol: 180 mg/dL (ref 0–200)
LDL Cholesterol: 111 mg/dL — ABNORMAL HIGH (ref 0–99)
Triglycerides: 48 mg/dL (ref 0.0–149.0)

## 2012-06-20 MED ORDER — NEBIVOLOL HCL 5 MG PO TABS: 5.0000 mg | ORAL_TABLET | Freq: Every day | ORAL | Status: DC

## 2012-06-20 MED ORDER — MOMETASONE FUROATE 220 MCG/INH IN AEPB: 2.0000 | INHALATION_SPRAY | Freq: Every day | RESPIRATORY_TRACT | Status: DC

## 2012-06-20 MED ORDER — MONTELUKAST SODIUM 10 MG PO TABS: 10.0000 mg | ORAL_TABLET | Freq: Every day | ORAL | Status: DC

## 2012-06-20 MED ORDER — ALPRAZOLAM 1 MG PO TABS: 1.0000 mg | ORAL_TABLET | Freq: Two times a day (BID) | ORAL | Status: DC | PRN

## 2012-06-20 NOTE — Assessment & Plan Note (Signed)
Exam was unremarkable.  Advised her to use prn OTC anti-histamine.

## 2012-06-20 NOTE — Progress Notes (Signed)
Subjective:    Patient ID: Cynthia Kirk, female    DOB: 09-27-1951, 61 y.o.   MRN: [id number]  HPI Here for follow up - reviewed chronic medical issues:  HTN - reports compliance with ongoing medical treatment and no changes in medication dose or frequency. denies adverse side effects related to current therapy.   DM2 - diet controlled since off steroids- reports compliance with ongoing diet control - denies adverse side effects related to current therapy. checks sugars each am   GERD - reports compliance with ongoing medical treatment and no changes in medication dose or frequency. denies adverse side effects related to current therapy. No abdominal pain   obesity -continued "inability" to lose weight.  onset of obesity 1990s when dx and tx for sarcoid  reports following low fat + calorie restrcited diet  not improved by exercise 1min/d 7d/wk  prev success tx in med wt loss clinic with b12 shots and phentermine; +fh "big people"  has not attended nutrition counseling or bariatric clinic meetings   Syncope events: 4 episodes over summer 2012 - none since Occurred while at rest while talking/laughing - no warning, out 3-5 sec Not associated with incontinence, chest pain or dizziness s/p cards eval for same 12/12 - mild decrease in LV fx on echo-   Past Medical History  Diagnosis Date  . Hiatal hernia   . Hypertension   . Type II or unspecified type diabetes mellitus without mention of complication, not stated as uncontrolled   . Morbid obesity   . Iron deficiency anemia, unspecified   . Esophageal reflux   . Allergic rhinitis, cause unspecified   . Obstructive sleep apnea (adult) (pediatric)   . Unspecified asthma   . Sarcoidosis 1996    @ [employer]  . Unspecified menopausal and postmenopausal disorder   . Intestinal infection due to Clostridium difficile   . Iron deficiency anemia   . Anxiety   . TMJ (dislocation of temporomandibular joint)   . Syncope     ?? Syncope ??  .  Ejection fraction     EF 45-50%, echo, October 27, 2011    Review of Systems  Constitutional: Positive for fatigue. Negative for unexpected weight change.  Cardiovascular: Negative for chest pain and palpitations.  Musculoskeletal: Negative for joint swelling. Back pain: chronic.       Objective:   Physical Exam  BP 120/82  Pulse 77  Temp 98.4 F (36.9 C) (Oral)  Ht 5\' 5"  (1.651 m)  Wt 252 lb 3.2 oz (114.397 kg)  BMI 41.97 kg/m2  SpO2 97% Wt Readings from Last 3 Encounters:  06/20/12 252 lb 3.2 oz (114.397 kg)  06/20/12 253 lb 3.2 oz (114.851 kg)  03/02/12 241 lb 12.8 oz (109.68 kg)   Constitutional: She is overweight; appears well-developed and well-nourished. No distress. Spouse at side Cardiovascular: Normal rate, regular rhythm and normal heart sounds.  No murmur heard. No BLE edema. Pulmonary/Chest: Effort normal and breath sounds normal. No respiratory distress. She has no wheezes.  Abdomen: obese, S NT, ND, +BS Psychiatric: She has a normal mood and affect. Her behavior is normal. Judgment and thought content normal.   Lab Results  Component Value Date   WBC 4.6 06/30/2011   HGB 12.3 06/30/2011   HCT 37.5 06/30/2011   PLT 241.0 06/30/2011   CHOL 162 09/14/2010   TRIG 73.0 09/14/2010   HDL 43.20 09/14/2010   ALT 14 06/30/2011   AST 21 06/30/2011   NA 138 06/30/2011   K  4.0 06/30/2011   CL 100 06/30/2011   CREATININE 0.8 06/30/2011   BUN 20 06/30/2011   CO2 29 06/30/2011   TSH 0.74 06/30/2011   HGBA1C 5.6 10/06/2011        Assessment & Plan:   See problem list. Medications and labs reviewed today.

## 2012-06-20 NOTE — Assessment & Plan Note (Signed)
She is compliant and reports benefit from therapy. 

## 2012-06-20 NOTE — Patient Instructions (Signed)
It was good to see you today. We have reviewed your prior records including labs and tests today Test(s) ordered today. Your results will be called to you after review (48-72hours after test completion). If any changes need to be made, you will be notified at that time. Medications reviewed, no changes at this time. Refill on medication(s) as discussed today. If labs normal, will renew up to 3 months of pentermin Please schedule followup in 6 months for weight, diabetes mellitus and blood pressure check, call sooner if problems.

## 2012-06-20 NOTE — Progress Notes (Signed)
Chief Complaint  Patient presents with  . Follow-up    Pt states her breathing has been fine--when it's hot she is more SOB, some wheezing. C/o sinus headache, PND, scratchy throat, occasional watery/itchy eyes x spring. In the mornings she has to hack up phlem that is stuck in the back of her throat--thick white w/ occasional yellow phlem  . Sleep Apnea    Pt states she has not worn her cpap x 1 month bc she was away and forgot to take it with her.    History of Present Illness: Cynthia Kirk is a 61 y.o. female with Asthma, Sarcoidosis, OSA, Rhinitis, and Hoarseness.  She still gets hoarse.  She has been using asmanex and pulmicort as needed.  She felt asmanex helped initially, but then she wasn't sure how much it helped.  She ran out of singulair about one month ago.  Her cough got worse with increased, thick sputum.  She has been using her albuterol more since the weather got hotter.  She was advised by Dr. Jenne Pane to see specialist at voice disorder center with [facility].  This appointment is pending approval by insurance.  She has noticed itching in her left ear and neck today.  She has not had ringing, discharge, or rash.  She is current using CPAP every night, and reports that this helps.    Past Medical History  Diagnosis Date  . Hiatal hernia   . Hypertension   . Type II or unspecified type diabetes mellitus without mention of complication, not stated as uncontrolled   . Morbid obesity   . Iron deficiency anemia, unspecified   . Esophageal reflux   . Allergic rhinitis, cause unspecified   . Obstructive sleep apnea (adult) (pediatric)   . Unspecified asthma   . Sarcoidosis 1996    @ [employer]  . Unspecified menopausal and postmenopausal disorder   . Intestinal infection due to Clostridium difficile   . Iron deficiency anemia   . Anxiety   . TMJ (dislocation of temporomandibular joint)   . Syncope     ?? Syncope ??  . Ejection fraction     EF 45-50%, echo, October 27, 2011      Past Surgical History  Procedure Date  . Vulvectomy   . Vulvectomy for cancerous cell   . Tubal pregnancy     x's 2  . Uretha lift   . Dilation and curettage of uterus     Outpatient Encounter Prescriptions as of 06/20/2012  Medication Sig Dispense Refill  . albuterol (PROVENTIL HFA;VENTOLIN HFA) 108 (90 BASE) MCG/ACT inhaler Inhale 2 puffs into the lungs 4 (four) times daily as needed.  1 Inhaler  3  . albuterol (PROVENTIL) (2.5 MG/3ML) 0.083% nebulizer solution Take 2.5 mg by nebulization every 6 (six) hours as needed.        . ALPRAZolam (XANAX) 1 MG tablet Take 1 tablet (1 mg total) by mouth 2 (two) times daily as needed for anxiety.  60 tablet  2  . [name] HCT 40-25 MG per tablet TAKE 1 TABLET DAILY.  30 tablet  3  . bifidobacterium infantis (ALIGN) capsule Take 1 capsule by mouth daily.  30 capsule  0  . Cholecalciferol (VITAMIN D) 2000 UNITS tablet Take 1,000 Units by mouth daily.       [name] CORAL CALCIUM PO Take by mouth daily.        [name] darifenacin (ENABLEX) 7.5 MG 24 hr tablet Take 15 mg by mouth daily.       [name]  dexlansoprazole (DEXILANT) 60 MG capsule Take 1 capsule (60 mg total) by mouth daily.  30 capsule  5  . fentaNYL (DURAGESIC - DOSED MCG/HR) 12 MCG/HR Place 1 patch onto the skin every 3 (three) days as needed.       . fluticasone (FLONASE) 50 MCG/ACT nasal spray Place 2 sprays into the nose daily as needed.       . Misc Natural Products (GLUCOSAMINE CHONDROITIN ADV PO) Take 2 tablets by mouth daily.        . mometasone (ASMANEX) 220 MCG/INH inhaler Inhale 2 puffs into the lungs at bedtime.  1 Inhaler  5  . montelukast (SINGULAIR) 10 MG tablet Take 1 tablet (10 mg total) by mouth at bedtime.  30 tablet  5  . nebivolol (BYSTOLIC) 5 MG tablet Take 1 tablet (5 mg total) by mouth daily.  30 tablet  5  . polyethylene glycol powder (GLYCOLAX/MIRALAX) powder       . DISCONTD: budesonide (PULMICORT) 0.5 MG/2ML nebulizer solution Take 2 mLs (0.5 mg total) by nebulization 2 (two)  times daily as needed.  60 mL  11  . DISCONTD: mometasone (ASMANEX 14 METERED DOSES) 220 MCG/INH inhaler Inhale 1 puff into the lungs at bedtime.  1 Inhaler  12  . DISCONTD: mometasone (ASMANEX) 220 MCG/INH inhaler Inhale 1 puff into the lungs daily as needed.      [name] DISCONTD: montelukast (SINGULAIR) 10 MG tablet Take 1 tablet (10 mg total) by mouth at bedtime.  30 tablet  1  . DISCONTD: polyethylene glycol powder (GLYCOLAX/MIRALAX) powder TAKE 17 GRAMS BY MOUTH DAILY  255 g  3  . DISCONTD: SAVELLA 25 MG TABS TAKE 1 TABLET (25 MG TOTAL) BY MOUTH DAILY.  30 tablet  5  . DISCONTD: vitamin B-12 (CYANOCOBALAMIN) 1000 MCG tablet Take 1,000 mcg by mouth daily.          Allergies  Allergen Reactions  . Aspirin     REACTION: hives  . Latex     REACTION: rash    Physical Exam:  Blood pressure 118/70, pulse 80, temperature 98.1 F (36.7 C), temperature source Oral, height 5\' 4"  (1.626 m), weight 253 lb 3.2 oz (114.851 kg), SpO2 96.00%.  Body mass index is 43.46 kg/(m^2). Wt Readings from Last 2 Encounters:  06/20/12 253 lb 3.2 oz (114.851 kg)  03/02/12 241 lb 12.8 oz (109.68 kg)    General - obese HEENT - no sinus tenderness, TM clear, no oral exudate, no LAN Cardiac - s1s2 regular Chest - no wheeze Abd - soft, nontender Ext - no edema Neuro - normal strength Psych - normal mood, behavior   Assessment/Plan:  Coralyn Helling, MD [facility] Pager:  [phone] 06/20/2012, 10:32 AM

## 2012-06-20 NOTE — Assessment & Plan Note (Signed)
Diet controlled - but weight gain noted check a1c now Lab Results  Component Value Date   HGBA1C 5.6 10/06/2011

## 2012-06-20 NOTE — Assessment & Plan Note (Signed)
She has been referred by Dr. Jenne Pane to voice disorder center at [facility].  Her appointment there is pending insurance approval.

## 2012-06-20 NOTE — Patient Instructions (Signed)
Asmanex two puffs nightly, and rinse mouth after each use Singulair 10 mg>>one pill nightly Albuterol as needed for cough, wheeze, or chest congestion Stop pulmicort while using asmanex Chest xray today>>will call with results Follow up in 6 months

## 2012-06-20 NOTE — Assessment & Plan Note (Signed)
BP Readings from Last 3 Encounters:  06/20/12 120/82  06/20/12 118/70  03/02/12 110/62   The current medical regimen is effective;  continue present plan and medications.

## 2012-06-20 NOTE — Assessment & Plan Note (Signed)
Will repeat chest xray today to monitor and call her with results.

## 2012-06-20 NOTE — Assessment & Plan Note (Signed)
Wt Readings from Last 3 Encounters:  06/20/12 252 lb 3.2 oz (114.397 kg)  06/20/12 253 lb 3.2 oz (114.851 kg)  03/02/12 241 lb 12.8 oz (109.68 kg)   Previously using phentermine in 2012 - no adverse side effects from same check thyroid and other labs now plan refill today if labs ok but i do not recommended more than 3 months consecutive use of same  - pt understands and agrees

## 2012-06-20 NOTE — Assessment & Plan Note (Signed)
I explained that she needs to use asmanex on regular basis for at least several months to determine if this helps.  Advised that she does not need to use pulmicort while using asmanex.  Will refill singulair.

## 2012-06-21 ENCOUNTER — Telehealth: Payer: Self-pay | Admitting: Pulmonary Disease

## 2012-06-21 NOTE — Telephone Encounter (Signed)
error 

## 2012-06-21 NOTE — Telephone Encounter (Signed)
Dg Chest 2 View  06/20/2012  *RADIOLOGY REPORT*  Clinical Data: Evaluate pulmonary sarcoidosis, cough, history of asthma  CHEST - 2 VIEW  Comparison: 04/09/2010; 03/04/2010; 02/26/2010; chest CT - 03/01/2010  Findings: Grossly unchanged cardiac silhouette and mediastinal contours.  Grossly unchanged upper lobe predominant coarse reticular nodular opacities and architectural distortion.  Three apparent pulmonary nodules overlying the left upper lung are grossly unchanged compared to the 02/2010 examination, but are not definitely seen on chest CT performed 03/01/2010.  No new focal airspace opacities.  No definite pleural effusion or pneumothorax. Grossly unchanged bones.  IMPRESSION:  1.  No acute cardiopulmonary disease. 2.  Grossly unchanged upper lobe predominant coarse reticular nodular opacities and architectural distortion compatible with provided history of sarcoidosis.  Original Report Authenticated By: Waynard Reeds, M.D.    Will have my nurse inform patient that CXR shows chronic changes from history of sarcoidosis.  No change compared to xrays from 2011.  No change to current plan.

## 2012-06-21 NOTE — Telephone Encounter (Signed)
lmomtcb x1 

## 2012-06-22 ENCOUNTER — Telehealth: Payer: Self-pay | Admitting: Pulmonary Disease

## 2012-06-22 ENCOUNTER — Other Ambulatory Visit: Payer: Self-pay | Admitting: Pulmonary Disease

## 2012-06-22 DIAGNOSIS — J45909 Unspecified asthma, uncomplicated: Secondary | ICD-10-CM

## 2012-06-22 MED ORDER — MONTELUKAST SODIUM 10 MG PO TABS: 10.0000 mg | ORAL_TABLET | Freq: Every day | ORAL | Status: DC

## 2012-06-22 NOTE — Telephone Encounter (Signed)
lmomtcb x 2  

## 2012-06-22 NOTE — Telephone Encounter (Signed)
Pt states the pharmacy received her asmanex rx but not Singulair so rx re-sent. Carron Curie, CMA

## 2012-06-27 NOTE — Telephone Encounter (Signed)
I spoke with patient about results and she verbalized understanding and had no questions 

## 2012-07-04 ENCOUNTER — Other Ambulatory Visit: Payer: Self-pay | Admitting: Internal Medicine

## 2012-07-04 NOTE — Telephone Encounter (Signed)
Caller: Debourah/Patient; PCP: Rene Paci; CB#: [phone]; ; ; Call regarding Results:  Patient calling for Lab results from 06/20/12.  No notes charted in emr to relay to patient.  Patient would like results of labs and to know if she can have a rx for Phentermine?  Will send note via Epic and advise to f/u 7/31.

## 2012-07-05 MED ORDER — PHENTERMINE HCL 37.5 MG PO CAPS: 37.5000 mg | ORAL_CAPSULE | ORAL | Status: AC

## 2012-07-05 NOTE — Telephone Encounter (Signed)
Called pt no answer LMOM RTC.Marland KitchenMarland Kitchen7/31/13@3 :15pm/LMB

## 2012-07-05 NOTE — Telephone Encounter (Signed)
Pt states that she was advised at [facility] if labs results were normal she would be prescribed Phentermine. Pt is requesting results of labs and status of Rx.

## 2012-07-05 NOTE — Telephone Encounter (Signed)
Labs ok - rx for phentermine done

## 2012-07-05 NOTE — Telephone Encounter (Signed)
Pt return call back gave md response. Left rx in cabinet up front... 07/05/12@3 :33pm/LMB

## 2012-07-17 ENCOUNTER — Telehealth: Payer: Self-pay | Admitting: Obstetrics and Gynecology

## 2012-07-17 NOTE — Telephone Encounter (Signed)
TRIAGE/APPT REQ. °

## 2012-07-17 NOTE — Telephone Encounter (Signed)
Mel to take care of this pt's appt

## 2012-07-18 ENCOUNTER — Encounter: Payer: Self-pay | Admitting: Obstetrics and Gynecology

## 2012-07-18 ENCOUNTER — Ambulatory Visit (INDEPENDENT_AMBULATORY_CARE_PROVIDER_SITE_OTHER): Payer: Medicare Other | Admitting: Obstetrics and Gynecology

## 2012-07-18 VITALS — BP 124/80 | HR 80 | Wt 250.0 lb

## 2012-07-18 DIAGNOSIS — Z719 Counseling, unspecified: Secondary | ICD-10-CM

## 2012-07-18 DIAGNOSIS — B373 Candidiasis of vulva and vagina: Secondary | ICD-10-CM | POA: Diagnosis not present

## 2012-07-18 DIAGNOSIS — B3731 Acute candidiasis of vulva and vagina: Secondary | ICD-10-CM | POA: Diagnosis not present

## 2012-07-18 DIAGNOSIS — N390 Urinary tract infection, site not specified: Secondary | ICD-10-CM

## 2012-07-18 LAB — POCT URINALYSIS DIPSTICK
Glucose, UA: NEGATIVE
Ketones, UA: NEGATIVE
Protein, UA: NEGATIVE
Spec Grav, UA: 1.01

## 2012-07-18 LAB — POCT WET PREP (WET MOUNT): Whiff Test: NEGATIVE

## 2012-07-18 MED ORDER — FLUCONAZOLE 150 MG PO TABS: 150.0000 mg | ORAL_TABLET | Freq: Once | ORAL | Status: DC

## 2012-07-18 MED ORDER — CLOTRIMAZOLE-BETAMETHASONE 1-0.05 % EX CREA: TOPICAL_CREAM | Freq: Two times a day (BID) | CUTANEOUS | Status: DC

## 2012-07-18 MED ORDER — CIPROFLOXACIN HCL 500 MG PO TABS: 500.0000 mg | ORAL_TABLET | Freq: Two times a day (BID) | ORAL | Status: AC

## 2012-07-18 MED ORDER — FLUCONAZOLE 150 MG PO TABS: ORAL_TABLET | ORAL | Status: DC

## 2012-07-18 NOTE — Progress Notes (Signed)
61 YO c/o vaginal itching/irritation and inability to control her bladder. Has a history of vulvectomy due to cancer and has diabetes but she says that its been well controlled. . Previously had good results with Enablex but the past few months it has not helped. Continues to use the Femring. Occasionally will have a BM when she has to urinate. Denies pelvic pain.      O: Pelvic: EGBUS-mildly atrophic, vagina-large yellow tinged discharge, cervix-no masses, uterus-no tenderness, exam limited by habitus, adnexae-no      tenderness  U/A-SG 1.010, pH  5.0,  leukocytes 1+  Wet Prep: pH-5.0, whiff-negative, moderate yeast  A: UTI     Yeast Vagintis     Urinary Incontinence      Diabetes        P:  Difulcan 150 mg #2  1 po stat and repeat in 3 days       Cipro 500 mg #6 bid x 3 days       Urine for culture        RTO-as scheduled or prn  Canden Cieslinski, PA-C

## 2012-07-18 NOTE — Progress Notes (Signed)
C/O VAGINAL DISCOMFORT WITH I/C AND TINGLING IN VAGINAL AREA AND C/O VAGINAL ITCHING.

## 2012-07-19 ENCOUNTER — Ambulatory Visit: Payer: Medicare Other | Admitting: Pulmonary Disease

## 2012-07-21 LAB — URINE CULTURE: Colony Count: 80000

## 2012-07-26 ENCOUNTER — Telehealth: Payer: Self-pay | Admitting: Obstetrics and Gynecology

## 2012-07-26 NOTE — Telephone Encounter (Signed)
JACKIE/RX °

## 2012-07-27 ENCOUNTER — Other Ambulatory Visit: Payer: Self-pay

## 2012-07-27 DIAGNOSIS — N959 Unspecified menopausal and perimenopausal disorder: Secondary | ICD-10-CM

## 2012-07-27 MED ORDER — MEDROXYPROGESTERONE ACETATE 10 MG PO TABS: 10.0000 mg | ORAL_TABLET | Freq: Every day | ORAL | Status: DC

## 2012-08-02 ENCOUNTER — Telehealth: Payer: Self-pay

## 2012-08-02 ENCOUNTER — Telehealth: Payer: Self-pay | Admitting: Obstetrics and Gynecology

## 2012-08-02 ENCOUNTER — Other Ambulatory Visit: Payer: Self-pay | Admitting: *Deleted

## 2012-08-02 MED ORDER — OLMESARTAN MEDOXOMIL-HCTZ 40-25 MG PO TABS: 1.0000 | ORAL_TABLET | Freq: Every day | ORAL | Status: DC

## 2012-08-02 NOTE — Telephone Encounter (Signed)
Spoke to pharmacist re: RF that was called in on 07/26/2012 on the Medroxyprogesterone 10 mg #14 with RF's through til 11/2012. They keep sending requests. It's been taken care of. Sig is take 1 tab po qd for 14 days q 3 weeks. Melody Comas A

## 2012-08-02 NOTE — Telephone Encounter (Signed)
Pt state she is out of town and needing to get rx on her benicar. States pharmacy said they contacted md but hasn't received call back. Inform pt we haven't received med request but can send to her pharmacy...Raechel Chute

## 2012-08-21 ENCOUNTER — Encounter: Payer: Medicare Other | Admitting: Obstetrics and Gynecology

## 2012-09-05 DIAGNOSIS — G894 Chronic pain syndrome: Secondary | ICD-10-CM | POA: Diagnosis not present

## 2012-09-05 DIAGNOSIS — M5137 Other intervertebral disc degeneration, lumbosacral region: Secondary | ICD-10-CM | POA: Diagnosis not present

## 2012-09-18 ENCOUNTER — Encounter: Payer: Medicare Other | Admitting: Obstetrics and Gynecology

## 2012-10-10 ENCOUNTER — Encounter: Payer: Medicare Other | Admitting: Obstetrics and Gynecology

## 2012-10-20 ENCOUNTER — Other Ambulatory Visit: Payer: Self-pay | Admitting: Internal Medicine

## 2012-10-23 NOTE — Telephone Encounter (Signed)
Notified pt rx ready for pick-up. Pt is wanting rx mail to her address. Mail to address on file...Cynthia Kirk

## 2012-11-08 DIAGNOSIS — M25569 Pain in unspecified knee: Secondary | ICD-10-CM | POA: Diagnosis not present

## 2012-11-14 DIAGNOSIS — M25569 Pain in unspecified knee: Secondary | ICD-10-CM | POA: Diagnosis not present

## 2012-11-15 ENCOUNTER — Ambulatory Visit: Payer: Medicare Other | Admitting: Obstetrics and Gynecology

## 2012-11-16 ENCOUNTER — Other Ambulatory Visit: Payer: Self-pay | Admitting: Internal Medicine

## 2012-11-17 ENCOUNTER — Other Ambulatory Visit: Payer: Self-pay

## 2012-11-17 MED ORDER — NEBIVOLOL HCL 5 MG PO TABS: 5.0000 mg | ORAL_TABLET | Freq: Every day | ORAL | Status: DC

## 2012-11-17 MED ORDER — DEXLANSOPRAZOLE 60 MG PO CPDR: 60.0000 mg | DELAYED_RELEASE_CAPSULE | Freq: Every day | ORAL | Status: DC

## 2012-11-17 NOTE — Addendum Note (Signed)
Addended by: Deatra James on: 11/17/2012 08:36 AM   Modules accepted: Orders

## 2012-11-17 NOTE — Telephone Encounter (Signed)
Valentina Gu, Ok to refill Bystolic but not Xanax. Thx! Rene Kocher

## 2012-11-17 NOTE — Telephone Encounter (Signed)
MD out of office. Rene Kocher is it ok to refill alprazolam..../lmb

## 2012-11-20 DIAGNOSIS — M1710 Unilateral primary osteoarthritis, unspecified knee: Secondary | ICD-10-CM | POA: Diagnosis not present

## 2012-11-20 DIAGNOSIS — IMO0002 Reserved for concepts with insufficient information to code with codable children: Secondary | ICD-10-CM | POA: Diagnosis not present

## 2012-11-21 ENCOUNTER — Other Ambulatory Visit: Payer: Self-pay | Admitting: Obstetrics and Gynecology

## 2012-11-21 DIAGNOSIS — N3281 Overactive bladder: Secondary | ICD-10-CM

## 2012-11-21 DIAGNOSIS — M1710 Unilateral primary osteoarthritis, unspecified knee: Secondary | ICD-10-CM | POA: Diagnosis not present

## 2012-11-22 ENCOUNTER — Other Ambulatory Visit: Payer: Self-pay

## 2012-11-23 NOTE — Telephone Encounter (Signed)
Annice Pih can you asked dr. Su Hilt if this pt can have this rx.   thanks

## 2012-11-23 NOTE — Telephone Encounter (Signed)
Late entry for yesterday. Ok'd 1 RF on this pt Rx for Provera 10 mg # 14 sig is to take 1 po qd q 3 weeks. Pt is being seen next month for AEX. Melody Comas A

## 2012-12-04 ENCOUNTER — Other Ambulatory Visit: Payer: Self-pay | Admitting: Internal Medicine

## 2012-12-04 NOTE — Telephone Encounter (Signed)
Last written 06/20/2012 #60 with 2 refills-please advise on refill in VAL's absence.

## 2012-12-07 ENCOUNTER — Telehealth: Payer: Self-pay

## 2012-12-07 NOTE — Telephone Encounter (Signed)
Called in 1 RF each on pt's Enablex 15 mg tabs # 30 and Medroxyprogesterone  10 mg tabs #14 . Pt has AEX appt this month. Melody Comas A

## 2012-12-14 DIAGNOSIS — M545 Low back pain, unspecified: Secondary | ICD-10-CM | POA: Diagnosis not present

## 2012-12-19 ENCOUNTER — Ambulatory Visit: Payer: Medicare Other | Admitting: Internal Medicine

## 2012-12-19 ENCOUNTER — Ambulatory Visit: Payer: Medicare Other | Admitting: Obstetrics and Gynecology

## 2012-12-20 ENCOUNTER — Encounter: Payer: Medicare Other | Admitting: Obstetrics and Gynecology

## 2012-12-20 ENCOUNTER — Ambulatory Visit: Payer: Medicare Other | Admitting: Obstetrics and Gynecology

## 2012-12-24 ENCOUNTER — Other Ambulatory Visit: Payer: Self-pay | Admitting: Internal Medicine

## 2013-01-02 ENCOUNTER — Ambulatory Visit: Payer: Medicare Other | Admitting: Obstetrics and Gynecology

## 2013-01-03 ENCOUNTER — Other Ambulatory Visit: Payer: Self-pay | Admitting: Pulmonary Disease

## 2013-01-03 ENCOUNTER — Ambulatory Visit: Payer: Medicare Other | Admitting: Pulmonary Disease

## 2013-01-04 ENCOUNTER — Ambulatory Visit: Payer: Medicare Other | Admitting: Pulmonary Disease

## 2013-01-04 ENCOUNTER — Telehealth: Payer: Self-pay | Admitting: Obstetrics and Gynecology

## 2013-01-04 ENCOUNTER — Telehealth: Payer: Self-pay

## 2013-01-04 ENCOUNTER — Other Ambulatory Visit: Payer: Self-pay | Admitting: Pulmonary Disease

## 2013-01-04 NOTE — Telephone Encounter (Signed)
Returned pts call re: refill on Femring. It was called to her pharmacy earlier.

## 2013-01-04 NOTE — Telephone Encounter (Signed)
Ar pt returning your call

## 2013-01-04 NOTE — Telephone Encounter (Signed)
Per protocol, Fem Ring 0.05 mg refilled 1 time only until pt can be seen for AEX. She had to R/S the other day d/t AR being delayed in surgery. Melody Comas A

## 2013-01-08 ENCOUNTER — Telehealth: Payer: Self-pay | Admitting: Gastroenterology

## 2013-01-08 MED ORDER — DEXLANSOPRAZOLE 60 MG PO CPDR: 60.0000 mg | DELAYED_RELEASE_CAPSULE | Freq: Every day | ORAL | Status: DC

## 2013-01-08 NOTE — Telephone Encounter (Signed)
Patient scheduled appt for 01/29/13 at 9:15am and verbalized understanding. Told patient she must keep appt for any further refills and one prescription refill for Dexilant has been sent to the pharmacy.

## 2013-01-10 DIAGNOSIS — M47817 Spondylosis without myelopathy or radiculopathy, lumbosacral region: Secondary | ICD-10-CM | POA: Diagnosis not present

## 2013-01-11 ENCOUNTER — Other Ambulatory Visit: Payer: Self-pay | Admitting: Obstetrics and Gynecology

## 2013-01-11 DIAGNOSIS — Z1231 Encounter for screening mammogram for malignant neoplasm of breast: Secondary | ICD-10-CM

## 2013-01-17 ENCOUNTER — Ambulatory Visit: Payer: Medicare Other | Admitting: Obstetrics and Gynecology

## 2013-01-29 ENCOUNTER — Telehealth: Payer: Self-pay | Admitting: Obstetrics and Gynecology

## 2013-01-29 ENCOUNTER — Other Ambulatory Visit (INDEPENDENT_AMBULATORY_CARE_PROVIDER_SITE_OTHER): Payer: Medicare Other

## 2013-01-29 ENCOUNTER — Encounter: Payer: Self-pay | Admitting: Internal Medicine

## 2013-01-29 ENCOUNTER — Encounter: Payer: Self-pay | Admitting: Pulmonary Disease

## 2013-01-29 ENCOUNTER — Ambulatory Visit (INDEPENDENT_AMBULATORY_CARE_PROVIDER_SITE_OTHER): Payer: Medicare Other | Admitting: Internal Medicine

## 2013-01-29 ENCOUNTER — Ambulatory Visit (INDEPENDENT_AMBULATORY_CARE_PROVIDER_SITE_OTHER): Payer: Medicare Other | Admitting: Gastroenterology

## 2013-01-29 ENCOUNTER — Ambulatory Visit (INDEPENDENT_AMBULATORY_CARE_PROVIDER_SITE_OTHER): Payer: Medicare Other | Admitting: Pulmonary Disease

## 2013-01-29 ENCOUNTER — Encounter: Payer: Self-pay | Admitting: Gastroenterology

## 2013-01-29 ENCOUNTER — Other Ambulatory Visit: Payer: Self-pay | Admitting: Pulmonary Disease

## 2013-01-29 VITALS — BP 112/72 | HR 72 | Temp 97.1°F | Wt 250.0 lb

## 2013-01-29 VITALS — BP 128/80 | HR 66 | Temp 98.1°F | Ht 65.0 in | Wt 250.0 lb

## 2013-01-29 VITALS — BP 100/68 | HR 68 | Ht 65.0 in | Wt 250.0 lb

## 2013-01-29 DIAGNOSIS — J309 Allergic rhinitis, unspecified: Secondary | ICD-10-CM | POA: Diagnosis not present

## 2013-01-29 DIAGNOSIS — G4733 Obstructive sleep apnea (adult) (pediatric): Secondary | ICD-10-CM

## 2013-01-29 DIAGNOSIS — E119 Type 2 diabetes mellitus without complications: Secondary | ICD-10-CM

## 2013-01-29 DIAGNOSIS — D869 Sarcoidosis, unspecified: Secondary | ICD-10-CM

## 2013-01-29 DIAGNOSIS — E538 Deficiency of other specified B group vitamins: Secondary | ICD-10-CM

## 2013-01-29 DIAGNOSIS — R5381 Other malaise: Secondary | ICD-10-CM | POA: Diagnosis not present

## 2013-01-29 DIAGNOSIS — K5900 Constipation, unspecified: Secondary | ICD-10-CM | POA: Diagnosis not present

## 2013-01-29 DIAGNOSIS — J45909 Unspecified asthma, uncomplicated: Secondary | ICD-10-CM

## 2013-01-29 DIAGNOSIS — R5383 Other fatigue: Secondary | ICD-10-CM | POA: Diagnosis not present

## 2013-01-29 DIAGNOSIS — E559 Vitamin D deficiency, unspecified: Secondary | ICD-10-CM

## 2013-01-29 DIAGNOSIS — Z23 Encounter for immunization: Secondary | ICD-10-CM

## 2013-01-29 DIAGNOSIS — I10 Essential (primary) hypertension: Secondary | ICD-10-CM

## 2013-01-29 DIAGNOSIS — J99 Respiratory disorders in diseases classified elsewhere: Secondary | ICD-10-CM

## 2013-01-29 DIAGNOSIS — D860 Sarcoidosis of lung: Secondary | ICD-10-CM

## 2013-01-29 DIAGNOSIS — R1013 Epigastric pain: Secondary | ICD-10-CM | POA: Diagnosis not present

## 2013-01-29 LAB — HEPATIC FUNCTION PANEL
ALT: 11 U/L (ref 0–35)
Alkaline Phosphatase: 78 U/L (ref 39–117)
Bilirubin, Direct: 0.1 mg/dL (ref 0.0–0.3)
Total Bilirubin: 0.7 mg/dL (ref 0.3–1.2)

## 2013-01-29 LAB — CBC WITH DIFFERENTIAL/PLATELET
Basophils Absolute: 0 10*3/uL (ref 0.0–0.1)
Eosinophils Absolute: 0.1 10*3/uL (ref 0.0–0.7)
Eosinophils Relative: 2.5 % (ref 0.0–5.0)
MCV: 88.2 fl (ref 78.0–100.0)
Monocytes Absolute: 0.4 10*3/uL (ref 0.1–1.0)
Neutrophils Relative %: 59.8 % (ref 43.0–77.0)
Platelets: 245 10*3/uL (ref 150.0–400.0)
RDW: 15.1 % — ABNORMAL HIGH (ref 11.5–14.6)
WBC: 5.6 10*3/uL (ref 4.5–10.5)

## 2013-01-29 LAB — HEMOGLOBIN A1C: Hgb A1c MFr Bld: 5.6 % (ref 4.6–6.5)

## 2013-01-29 LAB — BASIC METABOLIC PANEL
BUN: 15 mg/dL (ref 6–23)
Chloride: 101 mEq/L (ref 96–112)
Creatinine, Ser: 0.9 mg/dL (ref 0.4–1.2)
Glucose, Bld: 87 mg/dL (ref 70–99)

## 2013-01-29 MED ORDER — ALBUTEROL SULFATE HFA 108 (90 BASE) MCG/ACT IN AERS: 2.0000 | INHALATION_SPRAY | Freq: Four times a day (QID) | RESPIRATORY_TRACT | Status: DC | PRN

## 2013-01-29 MED ORDER — ALBUTEROL SULFATE (2.5 MG/3ML) 0.083% IN NEBU: 2.5000 mg | INHALATION_SOLUTION | Freq: Four times a day (QID) | RESPIRATORY_TRACT | Status: DC | PRN

## 2013-01-29 MED ORDER — RANITIDINE HCL 300 MG PO TABS: 300.0000 mg | ORAL_TABLET | Freq: Every day | ORAL | Status: DC

## 2013-01-29 MED ORDER — MOMETASONE FUROATE 220 MCG/INH IN AEPB: 2.0000 | INHALATION_SPRAY | Freq: Every day | RESPIRATORY_TRACT | Status: DC

## 2013-01-29 MED ORDER — CYANOCOBALAMIN 1000 MCG/ML IJ SOLN
1000.0000 ug | Freq: Once | INTRAMUSCULAR | Status: AC
  Administered 2013-01-29: 1000 ug via INTRAMUSCULAR

## 2013-01-29 MED ORDER — POLYETHYLENE GLYCOL 3350 17 GM/SCOOP PO POWD: 17.0000 g | Freq: Two times a day (BID) | ORAL | Status: DC

## 2013-01-29 MED ORDER — CLINDAMYCIN PHOS-BENZOYL PEROX 1-5 % EX GEL: Freq: Two times a day (BID) | CUTANEOUS | Status: DC

## 2013-01-29 MED ORDER — MONTELUKAST SODIUM 10 MG PO TABS: 10.0000 mg | ORAL_TABLET | Freq: Every day | ORAL | Status: DC

## 2013-01-29 NOTE — Assessment & Plan Note (Signed)
Diet controlled - but weight gain noted check a1c now Lab Results  Component Value Date   HGBA1C 5.5 06/20/2012

## 2013-01-29 NOTE — Progress Notes (Signed)
Subjective:    Patient ID: Cynthia Kirk, female    DOB: 06-May-1951, 62 y.o.   MRN: [id number]  HPI  Here for follow up - reviewed chronic medical issues:  hypertension - reports compliance with ongoing medical treatment and no changes in medication dose or frequency. denies adverse side effects related to current therapy.   DM2 - diet controlled since off steroids- reports compliance with ongoing diet control - denies adverse side effects related to current therapy. checks sugars each am   GERD - reports compliance with ongoing medical treatment and no changes in medication dose or frequency. denies adverse side effects related to current therapy. No abdominal pain   obesity -continued "inability" to lose weight.  onset of obesity 1990s when dx and tx for sarcoid  reports following low fat + calorie restrcited diet  not improved by exercise 78min/d 7d/wk  prev success tx in med wt loss clinic with b12 shots and phentermine; +fh "big people"  has not attended nutrition counseling or bariatric clinic meetings   Syncope events: 4 episodes over summer 2012 - none since Occurred while at rest while talking/laughing - no warning, out 3-5 sec Not associated with incontinence, chest pain or dizziness s/p cards eval for same 12/12 - mild decrease in LV fx on echo-   Past Medical History  Diagnosis Date  . Hiatal hernia   . Hypertension   . Type II or unspecified type diabetes mellitus without mention of complication, not stated as uncontrolled   . Morbid obesity 2010  . Iron deficiency anemia, unspecified   . Esophageal reflux   . Allergic rhinitis, cause unspecified   . Obstructive sleep apnea (adult) (pediatric)   . Unspecified asthma   . Sarcoidosis 1996    @ [employer] [facility]  . Unspecified menopausal and postmenopausal disorder   . Intestinal infection due to Clostridium difficile   . Iron deficiency anemia   . Syncope     ?? Syncope ??  . Ejection fraction     EF 45-50%, echo,  October 27, 2011  . H/O menorrhagia 2011  . Endometrial mass 2011  . Urinary incontinence, mixed 2011  . Simple endometrial hyperplasia 2011  . Herpes simplex type 2 infection complicating pregnancy   . ASCUS with positive high risk HPV   . PMB (postmenopausal bleeding) 05/13/2010  . H/O goiter   . VIN II (vulvar intraepithelial neoplasia II)   . Fibroid   . Migraine   . Pneumonia 2005,2007-2011  . Anxiety   . H/O Clostridium difficile infection 03/2010  . Adenomatous colon polyp   . Blood transfusion 1976    Due to ectopic pregnancy  . Abnormal Pap smear 2008  . H/O varicella   . History of measles, mumps, or rubella   . Pain, pelvic, female 2010  . TMJ (dislocation of temporomandibular joint)   . Hx of migraines     Review of Systems  Constitutional: Positive for fatigue. Negative for unexpected weight change.  Cardiovascular: Negative for chest pain and palpitations.  Musculoskeletal: Negative for joint swelling. Back pain: chronic.       Objective:   Physical Exam  BP 112/72  Pulse 72  Temp(Src) 97.1 F (36.2 C) (Oral)  Wt 250 lb (113.399 kg)  BMI 41.6 kg/m2  SpO2 93%  LMP 08/14/2011 Wt Readings from Last 3 Encounters:  01/29/13 250 lb (113.399 kg)  01/29/13 250 lb (113.399 kg)  01/29/13 250 lb (113.399 kg)   Constitutional: She is overweight; appears well-developed and well-nourished.  No distress. Spouse at side Cardiovascular: Normal rate, regular rhythm and normal heart sounds.  No murmur heard. No BLE edema. Pulmonary/Chest: Effort normal and breath sounds normal. No respiratory distress. She has no wheezes.  Abdomen: obese, S NT, ND, +BS Psychiatric: She has a normal mood and affect. Her behavior is normal. Judgment and thought content normal.   Lab Results  Component Value Date   WBC 4.6 06/30/2011   HGB 12.3 06/30/2011   HCT 37.5 06/30/2011   PLT 241.0 06/30/2011   CHOL 180 06/20/2012   TRIG 48.0 06/20/2012   HDL 59.60 06/20/2012   ALT 14 06/30/2011    AST 21 06/30/2011   NA 138 06/30/2011   K 4.0 06/30/2011   CL 100 06/30/2011   CREATININE 0.8 06/30/2011   BUN 20 06/30/2011   CO2 29 06/30/2011   TSH 0.75 06/20/2012   HGBA1C 5.5 06/20/2012        Assessment & Plan:   See problem list. Medications and labs reviewed today.  Fatigue - nonspecific symptoms/exam - check screening labs

## 2013-01-29 NOTE — Patient Instructions (Signed)
It was good to see you today. We have reviewed your prior records including labs and tests today Test(s) ordered today. Your results will be released to MyChart (or called to you) after review, usually within 72hours after test completion. If any changes need to be made, you will be notified at that same time. Medications reviewed, no changes at this time. Refill on medication(s) as discussed today. Please schedule followup in 6 months for weight, diabetes mellitus and blood pressure check, call sooner if problems.

## 2013-01-29 NOTE — Progress Notes (Signed)
Chief Complaint  Patient presents with  . Follow-up    breathing has been doing fair. Pt is haucking up dark yellow phlem (occasionally will have blood in phlem), PND, scratchy throat, occasional wheezing at night  . Sleep Apnea    wears CPAP everynight. Pt is wanting her pressure rechecked and machine. Pt does not feel like is strong enough. feeling tied during he day and is falling asleep    History of Present Illness: Cynthia Kirk is a 62 y.o. female with Asthma, Sarcoidosis, OSA, Rhinitis, and Hoarseness.  She has not been able to go to voice disorder center yet due to transportation issues.   She uses asmanex and singulair daily.  She gets a cough in the morning after waking up, but is fine otherwise.  Her hoarseness occurs more with change in weather.  She denies wheeze, chest tightness, or fever.    She does not feel her CPAP is giving enough pressure, and she has problems with mask leak.  TESTS: Spirometry 06/30/09 >> FEV1 1.69(87%), FVC 2.17(83%), FEV1% 78  Past Medical History  Diagnosis Date  . Hiatal hernia   . Hypertension   . Type II or unspecified type diabetes mellitus without mention of complication, not stated as uncontrolled   . Morbid obesity 2010  . Iron deficiency anemia, unspecified   . Esophageal reflux   . Allergic rhinitis, cause unspecified   . Obstructive sleep apnea (adult) (pediatric)   . Unspecified asthma   . Sarcoidosis 1996    @ [employer]  . Unspecified menopausal and postmenopausal disorder   . Intestinal infection due to Clostridium difficile   . Iron deficiency anemia   . Syncope     ?? Syncope ??  . Ejection fraction     EF 45-50%, echo, October 27, 2011  . H/O menorrhagia 2011  . Endometrial mass 2011  . Urinary incontinence, mixed 2011  . Simple endometrial hyperplasia 2011  . Herpes simplex type 2 infection complicating pregnancy   . ASCUS with positive high risk HPV   . PMB (postmenopausal bleeding) 05/13/2010  . H/O goiter   .  VIN II (vulvar intraepithelial neoplasia II)   . Fibroid   . Migraine   . Pneumonia 2005,2007-2011  . Anxiety   . H/O Clostridium difficile infection 03/2010  . Adenomatous colon polyp   . Blood transfusion 1976    Due to ectopic pregnancy  . Abnormal Pap smear 2008  . H/O varicella   . History of measles, mumps, or rubella   . Pain, pelvic, female 2010  . TMJ (dislocation of temporomandibular joint)   . Hx of migraines     Past Surgical History  Procedure Laterality Date  . Vulvectomy    . Vulvectomy for cancerous cell    . Tubal pregnancy      x's 2  . Uretha lift    . Hysteroscopy  2011  . Dilation and curettage of uterus  2007    Outpatient Encounter Prescriptions as of 01/29/2013  Medication Sig Dispense Refill  . albuterol (PROVENTIL HFA;VENTOLIN HFA) 108 (90 BASE) MCG/ACT inhaler Inhale 2 puffs into the lungs 4 (four) times daily as needed.  1 Inhaler  3  . albuterol (PROVENTIL) (2.5 MG/3ML) 0.083% nebulizer solution Take 2.5 mg by nebulization every 6 (six) hours as needed.        . ALPRAZolam (XANAX) 1 MG tablet TAKE 1 TABLET TWICE A DAY AS NEEDED FOR ANXIETY  60 tablet  2  . ASMANEX 60 METERED  DOSES 220 MCG/INH inhaler INHALE 2 PUFFS INTO THE LUNGS AT BEDTIME  1 Inhaler  2  . [name] HCT 40-25 MG per tablet TAKE 1 TABLET BY MOUTH DAILY.  30 tablet  5  . Cholecalciferol (VITAMIN D) 2000 UNITS tablet Take 1,000 Units by mouth daily.       [name] CORAL CALCIUM PO Take by mouth daily.        [name] darifenacin (ENABLEX) 7.5 MG 24 hr tablet Take 15 mg by mouth daily.       [name] dexlansoprazole (DEXILANT) 60 MG capsule Take 1 capsule (60 mg total) by mouth daily.  30 capsule  0  . HYDROcodone-acetaminophen (NORCO) 10-325 MG per tablet Take 1 tablet by mouth 3 (three) times daily as needed for pain.      . Lactobacillus (ACIDOPHILUS) 100 MG CAPS Take 1 capsule by mouth daily.      . medroxyPROGESTERone (PROVERA) 10 MG tablet Take 10 mg by mouth. Every 10 days      . methocarbamol  (ROBAXIN) 500 MG tablet Take 500 mg by mouth 3 (three) times daily as needed.      . montelukast (SINGULAIR) 10 MG tablet TAKE 1 TABLET (10 MG TOTAL) BY MOUTH AT BEDTIME.  30 tablet  2  . nebivolol (BYSTOLIC) 5 MG tablet Take 1 tablet (5 mg total) by mouth daily.  30 tablet  5  . phentermine 37.5 MG capsule TAKE ONE CAPSULE BY MOUTH EVERY MORNING  30 capsule  0  . polyethylene glycol powder (GLYCOLAX/MIRALAX) powder Take 17 g by mouth 2 (two) times daily.  527 g  11  . ranitidine (ZANTAC) 300 MG tablet Take 1 tablet (300 mg total) by mouth at bedtime.  30 tablet  11  . [DISCONTINUED] medroxyPROGESTERone (PROVERA) 10 MG tablet Take 1 tablet (10 mg total) by mouth daily.  14 tablet  4  . [DISCONTINUED] bifidobacterium infantis (ALIGN) capsule Take 1 capsule by mouth daily.  30 capsule  0   No facility-administered encounter medications on file as of 01/29/2013.    Allergies  Allergen Reactions  . Aspirin     REACTION: hives  . Latex     REACTION: rash    Physical Exam:  Filed Vitals:   01/29/13 1017  BP: 128/80  Pulse: 66  Temp: 98.1 F (36.7 C)  TempSrc: Oral  Height: 5\' 5"  (1.651 m)  Weight: 250 lb (113.399 kg)  SpO2: 99%     Current Encounter SPO2  01/29/13 1017 99%  06/20/12 1050 97%  06/20/12 1002 96%     Body mass index is 41.6 kg/(m^2).   Wt Readings from Last 2 Encounters:  01/29/13 250 lb (113.399 kg)  01/29/13 250 lb (113.399 kg)     General - No distress ENT - No sinus tenderness, no oral exudate, no LAN Cardiac - s1s2 regular, no murmur Chest - No wheeze/rales/dullness Back - No focal tenderness Abd - Soft, non-tender Ext - No edema Neuro - Normal strength Skin - No rashes Psych - normal mood, and behavior   Assessment/Plan:  Coralyn Helling, MD [facility] Pager:  [phone] 01/29/2013, 10:18 AM

## 2013-01-29 NOTE — Telephone Encounter (Signed)
VM from pt. Needs Rf Enablex and Femring.  Has annual with DR AR 03/12/13.

## 2013-01-29 NOTE — Assessment & Plan Note (Signed)
She feels her pressure setting needs to be adjusted.  Will get copy of her CPAP download.  Will also have her mask fit re-assessed.

## 2013-01-29 NOTE — Assessment & Plan Note (Signed)
Stable on CXR at last visit.  Will repeat PFT prior to her next visit.

## 2013-01-29 NOTE — Assessment & Plan Note (Signed)
Her current symptoms of morning cough are likely related to post-nasal drip.  She will f/u with Dr. Jenne Pane and [facility] in [place].

## 2013-01-29 NOTE — Telephone Encounter (Signed)
Spoke to pt to let her know that I called in 2 RF's on her Femring and the Enablex until she can get in for her AEX. Pt had to R/S due to the weather on the 12th. Melody Comas A

## 2013-01-29 NOTE — Progress Notes (Signed)
History of Present Illness: This is a 62 year old female accompanied by her husband. She has multiple medical problems. She complains of an intermittent upper abdominal burning that is slightly improved with the use of condoms. Symptoms generally occur later in the day. She has ongoing problems with constipation that was under good control and MiraLax but she recently ran out of  MiraLax. Denies weight loss, diarrhea, change in stool caliber, melena, hematochezia, nausea, vomiting, dysphagia, reflux symptoms, chest pain.  Current Medications, Allergies, Past Medical History, Past Surgical History, Family History and Social History were reviewed in [employer] record.  Physical Exam: General: Well developed , well nourished, obese, no acute distress Head: Normocephalic and atraumatic Eyes:  sclerae anicteric, EOMI Ears: Normal auditory acuity Mouth: No deformity or lesions Lungs: Clear throughout to auscultation Heart: Regular rate and rhythm; no murmurs, rubs or bruits Abdomen: Soft, minimal epigastric tenderness without rebound or guarding and non distended. No masses, hepatosplenomegaly or hernias noted. Normal Bowel sounds Musculoskeletal: Symmetrical with no gross deformities  Pulses:  Normal pulses noted Extremities: No clubbing, cyanosis, edema or deformities noted Neurological: Alert oriented x 4, grossly nonfocal Psychological:  Alert and cooperative. Normal mood and affect  Assessment and Recommendations:  1. Mild epigastric pain. Add ranitidine 300 mg at bedtime. Continue Dexilant qam.  2. Chronic constipation. Resume MiraLax.

## 2013-01-29 NOTE — Patient Instructions (Addendum)
Continue taking Dexilant every morning and start Zantac one tablet by mouth every evening before bedtime. A prescription has been sent to your pharmacy.   We have sent the following medications to your pharmacy for you to pick up at your convenience: Miralax.   Thank you for choosing me and [facility].  Venita Lick. Pleas Koch., MD., Clementeen Graham

## 2013-01-29 NOTE — Patient Instructions (Signed)
Will get copy of CPAP report and call with results Follow in 6 months with PFT

## 2013-01-29 NOTE — Addendum Note (Signed)
Addended by: Rene Paci A on: 01/29/2013 11:35 AM   Modules accepted: Orders

## 2013-01-29 NOTE — Assessment & Plan Note (Signed)
Stable on her current regimen of asmanex and singulair.  I have sent 3 month refills for her scripts.

## 2013-01-29 NOTE — Assessment & Plan Note (Signed)
BP Readings from Last 3 Encounters:  01/29/13 112/72  01/29/13 128/80  01/29/13 100/68   The current medical regimen is effective;  continue present plan and medications.

## 2013-01-30 ENCOUNTER — Other Ambulatory Visit: Payer: Self-pay | Admitting: *Deleted

## 2013-01-30 MED ORDER — CYANOCOBALAMIN 1000 MCG/ML IJ SOLN: 1000.0000 ug | Freq: Once | INTRAMUSCULAR | Status: DC

## 2013-01-30 MED ORDER — "SYRINGE/NEEDLE (DISP) 25G X 5/8"" 1 ML MISC": Status: DC

## 2013-01-30 NOTE — Addendum Note (Signed)
Addended by: Rene Paci A on: 01/30/2013 02:06 PM   Modules accepted: Orders

## 2013-01-30 NOTE — Telephone Encounter (Signed)
Called pt concerning labs results. Pt is wanting to give herself her B12 injections. Inform pt will send to her pharmacy...Cynthia Kirk

## 2013-02-02 ENCOUNTER — Other Ambulatory Visit: Payer: Self-pay | Admitting: Pulmonary Disease

## 2013-02-02 ENCOUNTER — Other Ambulatory Visit: Payer: Self-pay | Admitting: Gastroenterology

## 2013-02-07 ENCOUNTER — Ambulatory Visit: Payer: Medicare Other

## 2013-02-12 DIAGNOSIS — M47817 Spondylosis without myelopathy or radiculopathy, lumbosacral region: Secondary | ICD-10-CM | POA: Diagnosis not present

## 2013-02-13 ENCOUNTER — Ambulatory Visit
Admission: RE | Admit: 2013-02-13 | Discharge: 2013-02-13 | Disposition: A | Discharge status: Home / Self Care | Payer: Medicare Other | Source: Ambulatory Visit | Attending: Obstetrics and Gynecology | Admitting: Obstetrics and Gynecology

## 2013-02-13 DIAGNOSIS — Z1231 Encounter for screening mammogram for malignant neoplasm of breast: Secondary | ICD-10-CM

## 2013-02-15 ENCOUNTER — Encounter: Payer: Self-pay | Admitting: Internal Medicine

## 2013-02-15 MED ORDER — METRONIDAZOLE 1 % EX GEL: Freq: Every day | CUTANEOUS | Status: DC

## 2013-02-16 ENCOUNTER — Telehealth: Payer: Self-pay | Admitting: Pulmonary Disease

## 2013-02-16 NOTE — Telephone Encounter (Signed)
CPAP 12/25/12 to 01/23/13 >> Used on 20 of 30 nights with average 3 hrs 51 min.  Average AHI 0.8 with CPAP 9 cm H2O.  Minimal airleak.  Left message detailing that CPAP report shows good control of sleep apnea with current pressure settings, and that pt could have pressure setting reduced to 7 cm H2O if she is still having difficulty with CPAP set up.  Advised her to call back with questions.

## 2013-02-23 ENCOUNTER — Other Ambulatory Visit: Payer: Self-pay | Admitting: *Deleted

## 2013-02-23 MED ORDER — CYANOCOBALAMIN 1000 MCG/ML IJ SOLN: 1000.0000 ug | Freq: Once | INTRAMUSCULAR | Status: DC

## 2013-02-23 NOTE — Telephone Encounter (Signed)
Received fax from [facility] stating they was out of B12. Called pt to let her know & she can come to office to have injection until they received shipment. Pt states she would like a rx sent to [facility] & if they don't have will call bck to make nurse visit...Raechel Chute

## 2013-03-05 ENCOUNTER — Telehealth: Payer: Self-pay

## 2013-03-05 NOTE — Telephone Encounter (Signed)
If pt unable to get injection here, please have her take OTC Vit B12 pill DAILY until shot is available - no other good substitute.

## 2013-03-05 NOTE — Telephone Encounter (Signed)
Please advise on alternative/change as Cyanocobalamin 1000mg /ml injection is on back order for approx. A month.

## 2013-03-05 NOTE — Telephone Encounter (Signed)
Unable to contact patient as numbers in chart not correct.  Informed pharmacy of MD instuctions.

## 2013-03-07 ENCOUNTER — Encounter: Payer: Self-pay | Admitting: Diagnostic Neuroimaging

## 2013-03-07 ENCOUNTER — Ambulatory Visit (INDEPENDENT_AMBULATORY_CARE_PROVIDER_SITE_OTHER): Payer: Medicare Other | Admitting: Diagnostic Neuroimaging

## 2013-03-07 VITALS — BP 110/72 | HR 69 | Temp 98.0°F | Ht 64.0 in | Wt 254.0 lb

## 2013-03-07 DIAGNOSIS — M48061 Spinal stenosis, lumbar region without neurogenic claudication: Secondary | ICD-10-CM | POA: Diagnosis not present

## 2013-03-07 NOTE — Patient Instructions (Signed)
Follow up with Dr. Ethelene Hal and Dr. Shelle Iron.

## 2013-03-07 NOTE — Progress Notes (Signed)
[facility]  PATIENT: Cynthia Kirk DOB: 02/15/1952  REFERRING CLINICIAN: Ramos HISTORY FROM: patient and husband REASON FOR VISIT: new consult   HISTORICAL  CHIEF COMPLAINT:  Chief Complaint  Patient presents with  . DDD    back  . Pain    leg and burning    HISTORY OF PRESENT ILLNESS:   62 year old ambidextrous female with history of pulmonary sarcoidosis, asthma, diabetes, hypertension, anxiety, here for evaluation of burning sensation in her legs. Patient also has significant history of degenerative lumbar spine disease and spinal stenosis, currently being managed by pain management clinic.  In 2011 patient underwent epidural anesthesia and following procedure, had a 24-48 hour timeframe of difficulty walking. Patient had MRI of the lumbar spine which showed moderate spinal stenosis at L4-5 level. Eventually patient recovered. Over the next one to 2 years patient began to have increasing low back pain, pain in her legs, burning in her legs and thighs. Patient has been treated with epidural steroid injections, pain medications and physical therapy without relief.  He has also tried Norco, Lortab,Robaxin, gabapentin without relief.  REVIEW OF SYSTEMS: Full 14 system review of systems performed and notable only for weight gain, swelling or legs, ringing in the ears, itching, wheezing, snoring, incontinence, anemia, feeling cold, feeling thirsty, joint pain, allergy, runny nose, restless legs, passing out.  ALLERGIES: Allergies  Allergen Reactions  . Aspirin     REACTION: hives  . Latex     REACTION: rash    HOME MEDICATIONS: Outpatient Prescriptions Prior to Visit  Medication Sig Dispense Refill  . albuterol (PROVENTIL HFA;VENTOLIN HFA) 108 (90 BASE) MCG/ACT inhaler Inhale 2 puffs into the lungs 4 (four) times daily as needed.  3 Inhaler  3  . albuterol (PROVENTIL) (2.5 MG/3ML) 0.083% nebulizer solution Take 3 mLs (2.5 mg total) by nebulization every  6 (six) hours as needed.  75 mL  3  . ALPRAZolam (XANAX) 1 MG tablet TAKE 1 TABLET TWICE A DAY AS NEEDED FOR ANXIETY  60 tablet  2  . [name] HCT 40-25 MG per tablet TAKE 1 TABLET BY MOUTH DAILY.  30 tablet  5  . Cholecalciferol (VITAMIN D) 2000 UNITS tablet Take 1,000 Units by mouth daily.       [name] CORAL CALCIUM PO Take by mouth daily.        . cyanocobalamin (,VITAMIN B-12,) 1000 MCG/ML injection Inject 1 mL (1,000 mcg total) into the muscle once.  10 mL  0  . darifenacin (ENABLEX) 7.5 MG 24 hr tablet Take 15 mg by mouth daily.       [name] DEXILANT 60 MG capsule TAKE 1 CAPSULE (60 MG TOTAL) BY MOUTH DAILY.  30 capsule  11  . Lactobacillus (ACIDOPHILUS) 100 MG CAPS Take 1 capsule by mouth daily.      . medroxyPROGESTERone (PROVERA) 10 MG tablet Take 10 mg by mouth. Every 10 days      . methocarbamol (ROBAXIN) 500 MG tablet Take 500 mg by mouth 3 (three) times daily as needed.      . metroNIDAZOLE (METROGEL) 1 % gel Apply topically daily.  45 g  0  . mometasone (ASMANEX 60 METERED DOSES) 220 MCG/INH inhaler Inhale 2 puffs into the lungs at bedtime.  3 Inhaler  3  . montelukast (SINGULAIR) 10 MG tablet TAKE 1 TABLET (10 MG TOTAL) BY MOUTH AT BEDTIME.  30 tablet  5  . nebivolol (BYSTOLIC) 5 MG tablet Take 1 tablet (5 mg total) by mouth daily.  30 tablet  5  . polyethylene glycol powder (GLYCOLAX/MIRALAX) powder Take 17 g by mouth 2 (two) times daily.  527 g  11  . ranitidine (ZANTAC) 300 MG tablet Take 1 tablet (300 mg total) by mouth at bedtime.  30 tablet  11  . SYRINGE/NEEDLE, DISP, 1 ML (B-D SYRINGE/NEEDLE 1CC/25GX5/8) 25G X 5/8" 1 ML MISC Use 1 monthly to administer her b12 injection  12 each  0  . HYDROcodone-acetaminophen (NORCO) 10-325 MG per tablet Take 1 tablet by mouth 3 (three) times daily as needed for pain.       No facility-administered medications prior to visit.    PAST MEDICAL HISTORY: Past Medical History  Diagnosis Date  . Hiatal hernia   . Hypertension   . Type II or  unspecified type diabetes mellitus without mention of complication, not stated as uncontrolled   . Morbid obesity 2010  . Iron deficiency anemia, unspecified   . Esophageal reflux   . Allergic rhinitis, cause unspecified   . Obstructive sleep apnea (adult) (pediatric)   . Unspecified asthma   . Sarcoidosis 1996    @ [employer]  . Unspecified menopausal and postmenopausal disorder   . Intestinal infection due to Clostridium difficile   . Iron deficiency anemia   . Syncope     ?? Syncope ??  . Ejection fraction     EF 45-50%, echo, October 27, 2011  . H/O menorrhagia 2011  . Endometrial mass 2011  . Urinary incontinence, mixed 2011  . Simple endometrial hyperplasia 2011  . Herpes simplex type 2 infection complicating pregnancy   . ASCUS with positive high risk HPV   . PMB (postmenopausal bleeding) 05/13/2010  . H/O goiter   . VIN II (vulvar intraepithelial neoplasia II)   . Fibroid   . Migraine   . Pneumonia 2005,2007-2011  . Anxiety   . H/O Clostridium difficile infection 03/2010  . Adenomatous colon polyp   . Blood transfusion 1976    Due to ectopic pregnancy  . Abnormal Pap smear 2008  . H/O varicella   . History of measles, mumps, or rubella   . Pain, pelvic, female 2010  . TMJ (dislocation of temporomandibular joint)   . Hx of migraines     PAST SURGICAL HISTORY: Past Surgical History  Procedure Laterality Date  . Vulvectomy    . Vulvectomy for cancerous cell    . Tubal pregnancy      x's 2  . Uretha lift    . Hysteroscopy  2011  . Dilation and curettage of uterus  2007    FAMILY HISTORY: Family History  Problem Relation Age of Onset  . Heart attack Father   . Hypertension Father   . Diabetes Father   . Seizures Father   . Migraines Father   . Kidney disease Sister   . Osteoporosis Sister   . Heart attack Sister   . Asthma Daughter   . Asthma Son   . Osteoporosis Sister   . Lung cancer Maternal Uncle   . Cancer Paternal Uncle   . Kidney disease Other     . Diabetes Other   . High Cholesterol Brother     x2    SOCIAL HISTORY:  History   Social History  . Marital Status: Married    Spouse Name: N/A    Number of Children: 2  . Years of Education: PHD   Occupational History  . retired   .     Social History Main Topics  .  Smoking status: Never Smoker   . Smokeless tobacco: Never Used  . Alcohol Use: Yes     Comment: occasionally  . Drug Use: No  . Sexually Active: Yes    Birth Control/ Protection: Post-menopausal   Other Topics Concern  . Not on file   Social History Narrative   Pt lives at home with her spouse.   She does not use caffeine.     PHYSICAL EXAM  Filed Vitals:   03/07/13 1025  BP: 110/72  Pulse: 69  Temp: 98 F (36.7 C)  TempSrc: Oral  Height: 5\' 4"  (1.626 m)  Weight: 254 lb (115.214 kg)   Body mass index is 43.58 kg/(m^2).  GENERAL EXAM: Patient is in no distress; POSITIVE STRAIGHT LEG RAISE (RIGHT AND LEFT). SIG OBESITY.  CARDIOVASCULAR: Regular rate and rhythm, no murmurs, no carotid bruits  NEUROLOGIC: MENTAL STATUS: awake, alert, language fluent, comprehension intact, naming intact CRANIAL NERVE: no papilledema on fundoscopic exam, pupils equal and reactive to light, visual fields full to confrontation, extraocular muscles intact, no nystagmus, facial sensation and strength symmetric, uvula midline, shoulder shrug symmetric, tongue midline. MOTOR: normal bulk and tone, full strength in the BUE, BLE; LIMITED IN LEFT FOOT DF DUE TO LEFT LEG PAIN. SENSORY: normal and symmetric to light touch, pinprick, temperature. RIGHT TOE VIB 5 SEC. LEFT TOE VIB 10 SEC. COORDINATION: finger-nose-finger, fine finger movements normal REFLEXES: deep tendon reflexes present and symmetric. ANKLES 1. DOWN GOING TOES. GAIT/STATION: ANTALGIC GAIT. DIFF WITH TOE, HEEL AND TANDEM. Romberg is negative   DIAGNOSTIC DATA (LABS, IMAGING, TESTING) - I reviewed patient records, labs, notes, testing and imaging  myself where available.  Lab Results  Component Value Date   WBC 5.6 01/29/2013   HGB 11.9* 01/29/2013   HCT 35.4* 01/29/2013   MCV 88.2 01/29/2013   PLT 245.0 01/29/2013      Component Value Date/Time   NA 138 01/29/2013 1148   K 3.5 01/29/2013 1148   CL 101 01/29/2013 1148   CO2 32 01/29/2013 1148   GLUCOSE 87 01/29/2013 1148   BUN 15 01/29/2013 1148   CREATININE 0.9 01/29/2013 1148   CALCIUM 8.9 01/29/2013 1148   PROT 6.9 01/29/2013 1148   ALBUMIN 3.4* 01/29/2013 1148   AST 17 01/29/2013 1148   ALT 11 01/29/2013 1148   ALKPHOS 78 01/29/2013 1148   BILITOT 0.7 01/29/2013 1148   GFRNONAA 94.17 11/17/2010 1504   GFRAA  Value: >60        The eGFR has been calculated using the MDRD equation. This calculation has not been validated in all clinical situations. eGFR's persistently <60 mL/min signify possible Chronic Kidney Disease. 05/19/2010 1217   Lab Results  Component Value Date   CHOL 180 06/20/2012   HDL 59.60 06/20/2012   LDLCALC 111* 06/20/2012   TRIG 48.0 06/20/2012   CHOLHDL 3 06/20/2012   Lab Results  Component Value Date   HGBA1C 5.6 01/29/2013   Lab Results  Component Value Date   VITAMINB12 853 04/30/2010   Lab Results  Component Value Date   TSH 0.96 01/29/2013    05/21/10 MRI lumbar spine - central and left paracentral L4-L5 disc extrusion with cranial extension of disc material measuring 1 cm and multifactorial moderate central canal stenosis. Bilateral facet arthrosis with bilateral facet effusions. Bulging discs at T10-T11 and T11-T12 as well as L5-S1. Mild central stenosis at T11-T12.   ASSESSMENT AND PLAN  62 y.o. year old female  has a past medical history of Hiatal  hernia; Hypertension; Type II or unspecified type diabetes mellitus without mention of complication, not stated as uncontrolled; Morbid obesity (2010); Iron deficiency anemia, unspecified; Esophageal reflux; Allergic rhinitis, cause unspecified; Obstructive sleep apnea (adult) (pediatric); Unspecified asthma;  Sarcoidosis (1996); Unspecified menopausal and postmenopausal disorder; Intestinal infection due to Clostridium difficile; Iron deficiency anemia; Syncope; Ejection fraction; H/O menorrhagia (2011); Endometrial mass (2011); Urinary incontinence, mixed (2011); Simple endometrial hyperplasia (2011); Herpes simplex type 2 infection complicating pregnancy; ASCUS with positive high risk HPV; PMB (postmenopausal bleeding) (05/13/2010); H/O goiter; VIN II (vulvar intraepithelial neoplasia II); Fibroid; Migraine; Pneumonia (2005,2007-2011); Anxiety; H/O Clostridium difficile infection (03/2010); Adenomatous colon polyp; Blood transfusion (1976); Abnormal Pap smear (2008); H/O varicella; History of measles, mumps, or rubella; Pain, pelvic, female (2010); TMJ (dislocation of temporomandibular joint); and migraines. here with progressive low back pain, bilateral lower extremity pain and numbness over the past 3 years. Symptoms likely due to progressive lumbar spinal stenosis and lumbar radiculopathy. Her diabetes is currently diet controlled. I do not suspect any different reason for underlying peripheral neuropathy.  Recommendation followup with Dr. Ethelene Hal for symptom control, and discuss with Dr. Shelle Iron about repeat MRI lumbar spine and further evaluation of known L4-5 spinal stenosis.   Patient's current degenerative lumbar spine disease as well as bilateral knee arthritis and pain, are primarily related to her significant obesity. I encouraged her to followup with her PCP for exploration of other weight loss options including bariatric surgery.  Suanne Marker, MD 03/07/2013, 11:09 AM Certified in Neurology, Neurophysiology and Neuroimaging  [facility] [address] [place], [place] [address] [phone]

## 2013-03-08 ENCOUNTER — Encounter: Payer: Self-pay | Admitting: Diagnostic Neuroimaging

## 2013-03-15 DIAGNOSIS — G894 Chronic pain syndrome: Secondary | ICD-10-CM | POA: Diagnosis not present

## 2013-03-15 DIAGNOSIS — M1710 Unilateral primary osteoarthritis, unspecified knee: Secondary | ICD-10-CM | POA: Diagnosis not present

## 2013-03-15 DIAGNOSIS — IMO0002 Reserved for concepts with insufficient information to code with codable children: Secondary | ICD-10-CM | POA: Diagnosis not present

## 2013-03-15 DIAGNOSIS — M5137 Other intervertebral disc degeneration, lumbosacral region: Secondary | ICD-10-CM | POA: Diagnosis not present

## 2013-03-20 ENCOUNTER — Encounter (HOSPITAL_COMMUNITY): Payer: Self-pay | Admitting: *Deleted

## 2013-03-20 ENCOUNTER — Emergency Department (HOSPITAL_COMMUNITY): Payer: Medicare Other

## 2013-03-20 ENCOUNTER — Emergency Department (HOSPITAL_COMMUNITY)
Admission: EM | Admit: 2013-03-20 | Discharge: 2013-03-20 | Disposition: A | Discharge status: Home / Self Care | Payer: Medicare Other | Attending: Emergency Medicine | Admitting: Emergency Medicine

## 2013-03-20 DIAGNOSIS — R112 Nausea with vomiting, unspecified: Secondary | ICD-10-CM | POA: Insufficient documentation

## 2013-03-20 DIAGNOSIS — Z8719 Personal history of other diseases of the digestive system: Secondary | ICD-10-CM | POA: Insufficient documentation

## 2013-03-20 DIAGNOSIS — Z860100 Personal history of colon polyps, unspecified: Secondary | ICD-10-CM | POA: Insufficient documentation

## 2013-03-20 DIAGNOSIS — Z87828 Personal history of other (healed) physical injury and trauma: Secondary | ICD-10-CM | POA: Insufficient documentation

## 2013-03-20 DIAGNOSIS — Z79899 Other long term (current) drug therapy: Secondary | ICD-10-CM | POA: Insufficient documentation

## 2013-03-20 DIAGNOSIS — M549 Dorsalgia, unspecified: Secondary | ICD-10-CM | POA: Insufficient documentation

## 2013-03-20 DIAGNOSIS — Z8619 Personal history of other infectious and parasitic diseases: Secondary | ICD-10-CM | POA: Insufficient documentation

## 2013-03-20 DIAGNOSIS — N281 Cyst of kidney, acquired: Secondary | ICD-10-CM | POA: Diagnosis not present

## 2013-03-20 DIAGNOSIS — J45909 Unspecified asthma, uncomplicated: Secondary | ICD-10-CM | POA: Diagnosis not present

## 2013-03-20 DIAGNOSIS — Z8659 Personal history of other mental and behavioral disorders: Secondary | ICD-10-CM | POA: Insufficient documentation

## 2013-03-20 DIAGNOSIS — Z8679 Personal history of other diseases of the circulatory system: Secondary | ICD-10-CM | POA: Insufficient documentation

## 2013-03-20 DIAGNOSIS — Z8639 Personal history of other endocrine, nutritional and metabolic disease: Secondary | ICD-10-CM | POA: Insufficient documentation

## 2013-03-20 DIAGNOSIS — E119 Type 2 diabetes mellitus without complications: Secondary | ICD-10-CM | POA: Diagnosis not present

## 2013-03-20 DIAGNOSIS — K219 Gastro-esophageal reflux disease without esophagitis: Secondary | ICD-10-CM | POA: Insufficient documentation

## 2013-03-20 DIAGNOSIS — R1032 Left lower quadrant pain: Secondary | ICD-10-CM | POA: Insufficient documentation

## 2013-03-20 DIAGNOSIS — I10 Essential (primary) hypertension: Secondary | ICD-10-CM | POA: Diagnosis not present

## 2013-03-20 DIAGNOSIS — Z87412 Personal history of vulvar dysplasia: Secondary | ICD-10-CM | POA: Diagnosis not present

## 2013-03-20 DIAGNOSIS — F411 Generalized anxiety disorder: Secondary | ICD-10-CM | POA: Diagnosis not present

## 2013-03-20 DIAGNOSIS — Z8701 Personal history of pneumonia (recurrent): Secondary | ICD-10-CM | POA: Diagnosis not present

## 2013-03-20 DIAGNOSIS — R109 Unspecified abdominal pain: Secondary | ICD-10-CM

## 2013-03-20 DIAGNOSIS — Z862 Personal history of diseases of the blood and blood-forming organs and certain disorders involving the immune mechanism: Secondary | ICD-10-CM | POA: Insufficient documentation

## 2013-03-20 DIAGNOSIS — N8500 Endometrial hyperplasia, unspecified: Secondary | ICD-10-CM | POA: Insufficient documentation

## 2013-03-20 DIAGNOSIS — Z8742 Personal history of other diseases of the female genital tract: Secondary | ICD-10-CM | POA: Diagnosis not present

## 2013-03-20 DIAGNOSIS — G4733 Obstructive sleep apnea (adult) (pediatric): Secondary | ICD-10-CM | POA: Insufficient documentation

## 2013-03-20 DIAGNOSIS — K429 Umbilical hernia without obstruction or gangrene: Secondary | ICD-10-CM | POA: Diagnosis not present

## 2013-03-20 LAB — URINALYSIS, ROUTINE W REFLEX MICROSCOPIC
Bilirubin Urine: NEGATIVE
Glucose, UA: NEGATIVE mg/dL
Ketones, ur: 15 mg/dL — AB
Leukocytes, UA: NEGATIVE
Nitrite: NEGATIVE
Specific Gravity, Urine: 1.023 (ref 1.005–1.030)
pH: 7 (ref 5.0–8.0)

## 2013-03-20 LAB — CBC WITH DIFFERENTIAL/PLATELET
Basophils Absolute: 0 10*3/uL (ref 0.0–0.1)
Basophils Relative: 0 % (ref 0–1)
Eosinophils Absolute: 0 10*3/uL (ref 0.0–0.7)
Eosinophils Relative: 0 % (ref 0–5)
Lymphs Abs: 0.7 10*3/uL (ref 0.7–4.0)
MCH: 30 pg (ref 26.0–34.0)
MCHC: 34.3 g/dL (ref 30.0–36.0)
MCV: 87.4 fL (ref 78.0–100.0)
Neutrophils Relative %: 87 % — ABNORMAL HIGH (ref 43–77)
Platelets: 250 10*3/uL (ref 150–400)
RDW: 13.2 % (ref 11.5–15.5)

## 2013-03-20 LAB — COMPREHENSIVE METABOLIC PANEL
ALT: 9 U/L (ref 0–35)
AST: 19 U/L (ref 0–37)
Albumin: 3.6 g/dL (ref 3.5–5.2)
Alkaline Phosphatase: 89 U/L (ref 39–117)
CO2: 30 mEq/L (ref 19–32)
Chloride: 97 mEq/L (ref 96–112)
Creatinine, Ser: 0.92 mg/dL (ref 0.50–1.10)
GFR calc non Af Amer: 66 mL/min — ABNORMAL LOW (ref >90)
Potassium: 3.6 mEq/L (ref 3.5–5.1)
Total Bilirubin: 0.4 mg/dL (ref 0.3–1.2)

## 2013-03-20 MED ORDER — IOHEXOL 350 MG/ML SOLN: 100.0000 mL | Freq: Once | INTRAVENOUS | Status: DC | PRN

## 2013-03-20 MED ORDER — ONDANSETRON 4 MG PO TBDP
ORAL_TABLET | ORAL | Status: AC
  Filled 2013-03-20: qty 2

## 2013-03-20 MED ORDER — FENTANYL CITRATE 0.05 MG/ML IJ SOLN
50.0000 ug | Freq: Once | INTRAMUSCULAR | Status: AC
  Administered 2013-03-20: 50 ug via NASAL
  Filled 2013-03-20: qty 2

## 2013-03-20 MED ORDER — ONDANSETRON 4 MG PO TBDP
8.0000 mg | ORAL_TABLET | Freq: Once | ORAL | Status: AC
  Administered 2013-03-20: 8 mg via ORAL

## 2013-03-20 MED ORDER — HYDROMORPHONE HCL PF 2 MG/ML IJ SOLN
2.0000 mg | INTRAMUSCULAR | Status: AC
  Administered 2013-03-20: 2 mg via INTRAMUSCULAR
  Filled 2013-03-20: qty 1

## 2013-03-20 MED ORDER — ONDANSETRON 4 MG PO TBDP
8.0000 mg | ORAL_TABLET | Freq: Once | ORAL | Status: AC
  Administered 2013-03-20: 8 mg via ORAL
  Filled 2013-03-20: qty 2

## 2013-03-20 MED ORDER — HYDROMORPHONE HCL PF 1 MG/ML IJ SOLN: 1.0000 mg | Freq: Once | INTRAMUSCULAR | Status: DC

## 2013-03-20 MED ORDER — HYDROMORPHONE HCL PF 1 MG/ML IJ SOLN
1.0000 mg | INTRAMUSCULAR | Status: AC
  Administered 2013-03-20: 1 mg via INTRAMUSCULAR
  Filled 2013-03-20: qty 1

## 2013-03-20 MED ORDER — IOHEXOL 300 MG/ML  SOLN: 50.0000 mL | Freq: Once | INTRAMUSCULAR | Status: DC | PRN

## 2013-03-20 MED ORDER — OXYCODONE-ACETAMINOPHEN 5-325 MG PO TABS: 2.0000 | ORAL_TABLET | Freq: Once | ORAL | Status: DC

## 2013-03-20 MED ORDER — HYDROMORPHONE HCL PF 1 MG/ML IJ SOLN
1.0000 mg | Freq: Once | INTRAMUSCULAR | Status: AC
  Administered 2013-03-20: 1 mg via INTRAMUSCULAR
  Filled 2013-03-20: qty 1

## 2013-03-20 NOTE — ED Provider Notes (Signed)
Ct scan negative She reports pain is continuing but she is in no distress There is no focal motor deficits in her lower extremities (she reports back pain) Her abdomen is soft, doubt occult abdominal process She denies cp/sob.  Her abdominal pain is lower, so I doubt ACS We discussed strict return precautions Advised nee for followup as outpatient    Joya Gaskins, MD 03/20/13 [phone]

## 2013-03-20 NOTE — ED Provider Notes (Signed)
History     CSN: [id number]  Arrival date & time 03/20/13  0041   First MD Initiated Contact with Patient 03/20/13 [phone]      Chief Complaint  Patient presents with  . Flank Pain    left    (Consider location/radiation/quality/duration/timing/severity/associated sxs/prior treatment) HPI 62 year old female presents to the emergency room with complaint of left lower quadrant abdominal pain wrapping around to her back.  Pain started this evening.  She has had nausea and vomiting.  No urinary symptoms, no fevers, no diarrhea.  No prior history of same.  No vaginal discharge.  She is having normal bowel movements.  No prior history of kidney stones.    Past Medical History  Diagnosis Date  . Hiatal hernia   . Hypertension   . Type II or unspecified type diabetes mellitus without mention of complication, not stated as uncontrolled   . Morbid obesity 2010  . Iron deficiency anemia, unspecified   . Esophageal reflux   . Allergic rhinitis, cause unspecified   . Obstructive sleep apnea (adult) (pediatric)   . Unspecified asthma   . Sarcoidosis 1996    @ [employer]  . Unspecified menopausal and postmenopausal disorder   . Intestinal infection due to Clostridium difficile   . Iron deficiency anemia   . Syncope     ?? Syncope ??  . Ejection fraction     EF 45-50%, echo, October 27, 2011  . H/O menorrhagia 2011  . Endometrial mass 2011  . Urinary incontinence, mixed 2011  . Simple endometrial hyperplasia 2011  . Herpes simplex type 2 infection complicating pregnancy   . ASCUS with positive high risk HPV   . PMB (postmenopausal bleeding) 05/13/2010  . H/O goiter   . VIN II (vulvar intraepithelial neoplasia II)   . Fibroid   . Migraine   . Pneumonia 2005,2007-2011  . Anxiety   . H/O Clostridium difficile infection 03/2010  . Adenomatous colon polyp   . Blood transfusion 1976    Due to ectopic pregnancy  . Abnormal Pap smear 2008  . H/O varicella   . History of measles, mumps, or  rubella   . Pain, pelvic, female 2010  . TMJ (dislocation of temporomandibular joint)   . Hx of migraines     Past Surgical History  Procedure Laterality Date  . Vulvectomy    . Vulvectomy for cancerous cell    . Tubal pregnancy      x's 2  . Uretha lift    . Hysteroscopy  2011  . Dilation and curettage of uterus  2007    Family History  Problem Relation Age of Onset  . Heart attack Father   . Hypertension Father   . Diabetes Father   . Seizures Father   . Migraines Father   . Kidney disease Sister   . Osteoporosis Sister   . Heart attack Sister   . Asthma Daughter   . Asthma Son   . Osteoporosis Sister   . Lung cancer Maternal Uncle   . Cancer Paternal Uncle   . Kidney disease Other   . Diabetes Other   . High Cholesterol Brother     x2    History  Substance Use Topics  . Smoking status: Never Smoker   . Smokeless tobacco: Never Used  . Alcohol Use: Yes     Comment: occasionally    OB History   Grav Para Term Preterm Abortions TAB SAB Ect Mult Living   8  2      Review of Systems  See History of Present Illness; otherwise all other systems are reviewed and negative Allergies  Aspirin and Latex  Home Medications   Current Outpatient Rx  Name  Route  Sig  Dispense  Refill  . albuterol (PROVENTIL HFA;VENTOLIN HFA) 108 (90 BASE) MCG/ACT inhaler   Inhalation   Inhale 2 puffs into the lungs 4 (four) times daily as needed.   3 Inhaler   3   . albuterol (PROVENTIL) (2.5 MG/3ML) 0.083% nebulizer solution   Nebulization   Take 3 mLs (2.5 mg total) by nebulization every 6 (six) hours as needed.   75 mL   3   . ALPRAZolam (XANAX) 1 MG tablet      TAKE 1 TABLET TWICE A DAY AS NEEDED FOR ANXIETY   60 tablet   2   . [name] HCT 40-25 MG per tablet      TAKE 1 TABLET BY MOUTH DAILY.   30 tablet   5   . Cholecalciferol (VITAMIN D) 2000 UNITS tablet   Oral   Take 1,000 Units by mouth daily.          . cyanocobalamin (,VITAMIN B-12,)  1000 MCG/ML injection   Intramuscular   Inject 1,000 mcg into the muscle every 30 (thirty) days.         [name] darifenacin (ENABLEX) 7.5 MG 24 hr tablet   Oral   Take 15 mg by mouth daily.          [name] DEXILANT 60 MG capsule      TAKE 1 CAPSULE (60 MG TOTAL) BY MOUTH DAILY.   30 capsule   11   . HYDROcodone-acetaminophen (NORCO) 10-325 MG per tablet   Oral   Take 1 tablet by mouth 3 (three) times daily as needed for pain.         . medroxyPROGESTERone (PROVERA) 10 MG tablet   Oral   Take 10 mg by mouth. Every 10 days         . mometasone (ASMANEX 60 METERED DOSES) 220 MCG/INH inhaler   Inhalation   Inhale 2 puffs into the lungs at bedtime.   3 Inhaler   3   . montelukast (SINGULAIR) 10 MG tablet      TAKE 1 TABLET (10 MG TOTAL) BY MOUTH AT BEDTIME.   30 tablet   5   . nebivolol (BYSTOLIC) 5 MG tablet   Oral   Take 1 tablet (5 mg total) by mouth daily.   30 tablet   5   . polyethylene glycol powder (GLYCOLAX/MIRALAX) powder   Oral   Take 17 g by mouth 2 (two) times daily.   527 g   11   . ranitidine (ZANTAC) 300 MG tablet   Oral   Take 1 tablet (300 mg total) by mouth at bedtime.   30 tablet   11   . SYRINGE/NEEDLE, DISP, 1 ML (B-D SYRINGE/NEEDLE 1CC/25GX5/8) 25G X 5/8" 1 ML MISC      Use 1 monthly to administer her b12 injection   12 each   0     BP 140/82  Pulse 65  Temp(Src) 98.5 F (36.9 C) (Oral)  Resp 16  SpO2 100%  LMP 08/14/2011  Physical Exam  Nursing note and vitals reviewed. Constitutional: She is oriented to person, place, and time. She appears well-developed and well-nourished. She appears distressed.  Morbidly obese, female, uncomfortable.  HENT:  Head: Normocephalic and atraumatic.  Nose: Nose normal.  Mouth/Throat: Oropharynx is clear and moist.  Eyes: Conjunctivae and EOM are normal. Pupils are equal, round, and reactive to light.  Neck: Normal range of motion. Neck supple. No JVD present. No tracheal deviation present. No  thyromegaly present.  Cardiovascular: Normal rate, regular rhythm, normal heart sounds and intact distal pulses.  Exam reveals no gallop and no friction rub.   No murmur heard. Pulmonary/Chest: Effort normal and breath sounds normal. No stridor. No respiratory distress. She has no wheezes. She has no rales. She exhibits no tenderness.  Abdominal: Soft. Bowel sounds are normal. She exhibits no distension and no mass. There is tenderness (patient with tenderness to suprapubic and left lower quadrant.  She has some left CVA tenderness). There is no rebound and no guarding.  Musculoskeletal: Normal range of motion. She exhibits no edema and no tenderness.  Lymphadenopathy:    She has no cervical adenopathy.  Neurological: She is alert and oriented to person, place, and time. She exhibits normal muscle tone. Coordination normal.  Skin: Skin is warm and dry. No rash noted. No erythema. No pallor.  Psychiatric: She has a normal mood and affect. Her behavior is normal. Judgment and thought content normal.    ED Course  Procedures (including critical care time)  Labs Reviewed  URINALYSIS, ROUTINE W REFLEX MICROSCOPIC - Abnormal; Notable for the following:    Ketones, ur 15 (*)    All other components within normal limits  COMPREHENSIVE METABOLIC PANEL - Abnormal; Notable for the following:    Glucose, Bld 111 (*)    GFR calc non Af Amer 66 (*)    GFR calc Af Amer 76 (*)    All other components within normal limits  CBC WITH DIFFERENTIAL - Abnormal; Notable for the following:    Neutrophils Relative 87 (*)    Lymphocytes Relative 10 (*)    All other components within normal limits   No results found.   Abdominal pain    MDM  62 year old female with left lower quadrant pain.  Differential includes diverticulitis, kidney stone, less likely ovarian pathology.  We have been unable to get an IV placed.  Despite multiple attempts.  She is to have CT scan with oral contrast only.  She is  received IM pain medications.  Labs are unremarkable aside from ketones in her urine.   7:42 AM Care passed to Dr. Bebe Shaggy awaiting CT scan results.  Plan is for discharge home.  At this time given her normal vitals, and lab work.  Feel that it.  Patient does have diverticulitis, she will be able to be managed outpatient with pain medication and antibiotics if need be.  If kidney stone, pain management, and followup with urology.       Olivia Mackie, MD 03/20/13 [phone]

## 2013-03-20 NOTE — ED Notes (Signed)
Patient transported to CT 

## 2013-03-20 NOTE — ED Notes (Signed)
Pt states she began having intense left flank pain this evening that radiates around to her abdomen with n/v.  Pt very anxious and tearful.

## 2013-03-20 NOTE — ED Notes (Signed)
IV team unable to gain IV access, recommend PICC line.

## 2013-03-20 NOTE — ED Notes (Signed)
Pt finished with first cup of PO contrast, CT aware.

## 2013-03-20 NOTE — ED Provider Notes (Signed)
I assumed care in signout to f/u on CT imaging  If negative pt likely to be discharged home   Joya Gaskins, MD 03/20/13 [phone]

## 2013-03-20 NOTE — ED Notes (Signed)
Pt states left flank pain, pain radiates from her left side to her back. Pt denies noticing blood in her urine. Pt tearful in triage due to pain. Pt denies problems with bowels.

## 2013-03-20 NOTE — ED Notes (Signed)
2 RNs attempted IV without success. IV team paged.  

## 2013-03-20 NOTE — ED Notes (Signed)
Spoke with IV team, will come for IV start.

## 2013-03-20 NOTE — ED Notes (Signed)
Report received, assumed care.  

## 2013-03-21 DIAGNOSIS — M1710 Unilateral primary osteoarthritis, unspecified knee: Secondary | ICD-10-CM | POA: Diagnosis not present

## 2013-03-21 DIAGNOSIS — IMO0002 Reserved for concepts with insufficient information to code with codable children: Secondary | ICD-10-CM | POA: Diagnosis not present

## 2013-03-29 DIAGNOSIS — M5137 Other intervertebral disc degeneration, lumbosacral region: Secondary | ICD-10-CM | POA: Diagnosis not present

## 2013-03-29 DIAGNOSIS — G894 Chronic pain syndrome: Secondary | ICD-10-CM | POA: Diagnosis not present

## 2013-04-05 DIAGNOSIS — Z13820 Encounter for screening for osteoporosis: Secondary | ICD-10-CM | POA: Diagnosis not present

## 2013-04-05 DIAGNOSIS — M899 Disorder of bone, unspecified: Secondary | ICD-10-CM | POA: Diagnosis not present

## 2013-04-05 DIAGNOSIS — Z01419 Encounter for gynecological examination (general) (routine) without abnormal findings: Secondary | ICD-10-CM | POA: Diagnosis not present

## 2013-04-05 DIAGNOSIS — R32 Unspecified urinary incontinence: Secondary | ICD-10-CM | POA: Diagnosis not present

## 2013-04-05 DIAGNOSIS — R29890 Loss of height: Secondary | ICD-10-CM | POA: Diagnosis not present

## 2013-04-05 DIAGNOSIS — N760 Acute vaginitis: Secondary | ICD-10-CM | POA: Diagnosis not present

## 2013-04-05 DIAGNOSIS — Z124 Encounter for screening for malignant neoplasm of cervix: Secondary | ICD-10-CM | POA: Diagnosis not present

## 2013-04-05 DIAGNOSIS — Z8262 Family history of osteoporosis: Secondary | ICD-10-CM | POA: Diagnosis not present

## 2013-05-08 DIAGNOSIS — M48061 Spinal stenosis, lumbar region without neurogenic claudication: Secondary | ICD-10-CM | POA: Diagnosis not present

## 2013-05-08 DIAGNOSIS — M549 Dorsalgia, unspecified: Secondary | ICD-10-CM | POA: Diagnosis not present

## 2013-05-08 DIAGNOSIS — Q762 Congenital spondylolisthesis: Secondary | ICD-10-CM | POA: Diagnosis not present

## 2013-05-10 DIAGNOSIS — M1710 Unilateral primary osteoarthritis, unspecified knee: Secondary | ICD-10-CM | POA: Diagnosis not present

## 2013-05-10 DIAGNOSIS — IMO0002 Reserved for concepts with insufficient information to code with codable children: Secondary | ICD-10-CM | POA: Diagnosis not present

## 2013-05-14 ENCOUNTER — Other Ambulatory Visit: Payer: Self-pay | Admitting: Internal Medicine

## 2013-05-16 ENCOUNTER — Other Ambulatory Visit: Payer: Self-pay | Admitting: Obstetrics and Gynecology

## 2013-05-16 DIAGNOSIS — R87610 Atypical squamous cells of undetermined significance on cytologic smear of cervix (ASC-US): Secondary | ICD-10-CM | POA: Diagnosis not present

## 2013-05-16 DIAGNOSIS — D260 Other benign neoplasm of cervix uteri: Secondary | ICD-10-CM | POA: Diagnosis not present

## 2013-05-16 DIAGNOSIS — N870 Mild cervical dysplasia: Secondary | ICD-10-CM | POA: Diagnosis not present

## 2013-05-31 DIAGNOSIS — Z79899 Other long term (current) drug therapy: Secondary | ICD-10-CM | POA: Diagnosis not present

## 2013-05-31 DIAGNOSIS — M5137 Other intervertebral disc degeneration, lumbosacral region: Secondary | ICD-10-CM | POA: Diagnosis not present

## 2013-05-31 DIAGNOSIS — G894 Chronic pain syndrome: Secondary | ICD-10-CM | POA: Diagnosis not present

## 2013-06-13 ENCOUNTER — Other Ambulatory Visit: Payer: Self-pay | Admitting: Internal Medicine

## 2013-06-20 ENCOUNTER — Other Ambulatory Visit: Payer: Self-pay | Admitting: Pulmonary Disease

## 2013-07-06 ENCOUNTER — Other Ambulatory Visit: Payer: Self-pay | Admitting: Internal Medicine

## 2013-07-06 ENCOUNTER — Other Ambulatory Visit: Payer: Self-pay | Admitting: Pulmonary Disease

## 2013-07-06 NOTE — Telephone Encounter (Signed)
Alprazolam called to pharmacy  

## 2013-07-10 ENCOUNTER — Other Ambulatory Visit: Payer: Self-pay | Admitting: Neurological Surgery

## 2013-07-10 DIAGNOSIS — Q762 Congenital spondylolisthesis: Secondary | ICD-10-CM | POA: Diagnosis not present

## 2013-07-10 DIAGNOSIS — M549 Dorsalgia, unspecified: Secondary | ICD-10-CM | POA: Diagnosis not present

## 2013-07-11 DIAGNOSIS — J019 Acute sinusitis, unspecified: Secondary | ICD-10-CM | POA: Diagnosis not present

## 2013-07-11 DIAGNOSIS — R490 Dysphonia: Secondary | ICD-10-CM | POA: Diagnosis not present

## 2013-07-23 ENCOUNTER — Telehealth: Payer: Self-pay

## 2013-07-23 MED ORDER — NYSTATIN 100000 UNIT/GM EX CREA: TOPICAL_CREAM | CUTANEOUS | Status: DC | PRN

## 2013-07-23 NOTE — Telephone Encounter (Signed)
Patient called c/o yeast on thighs and rectal area. She stated that she just completed 2 weeks of antibiotics and while out of town, family washed her clothes in [place] which she is allergic to. She would like to request another round of diflucan or some type of cream to help get some relief. Pt request a call back at [phone], states still out of town. Thanks

## 2013-07-23 NOTE — Telephone Encounter (Signed)
Notified pt with md response. Sent cream to [facility] in [place]...lmb

## 2013-07-23 NOTE — Telephone Encounter (Signed)
Nystatin cream prn - may send wherever rx needed - thanks

## 2013-07-30 ENCOUNTER — Telehealth: Payer: Self-pay | Admitting: *Deleted

## 2013-07-30 ENCOUNTER — Ambulatory Visit: Payer: Medicare Other | Admitting: Internal Medicine

## 2013-07-30 NOTE — Telephone Encounter (Signed)
To[facility]oma Schanz Fax: [phone] From: [facility] Date/ Time: 07/28/2013 10:39 PM Taken By: Reita Cliche, CSR Caller: Gavin Pound Facility: not collected Patient: Cynthia Kirk, Cynthia Kirk DOB: 02/15/1952 Phone: [phone] Reason for Call: See info below Regarding Appointment: Yes Appt Date: 07/30/2013 Appt Time: 10:45:00 AM Provider: Rene Paci (Adults only) Reason: Details: out of town Outcome: Cancelled appointment in EPIC [place])

## 2013-08-09 DIAGNOSIS — M5137 Other intervertebral disc degeneration, lumbosacral region: Secondary | ICD-10-CM | POA: Diagnosis not present

## 2013-08-09 DIAGNOSIS — G894 Chronic pain syndrome: Secondary | ICD-10-CM | POA: Diagnosis not present

## 2013-08-10 DIAGNOSIS — N390 Urinary tract infection, site not specified: Secondary | ICD-10-CM | POA: Diagnosis not present

## 2013-08-10 DIAGNOSIS — R350 Frequency of micturition: Secondary | ICD-10-CM | POA: Diagnosis not present

## 2013-08-10 DIAGNOSIS — B356 Tinea cruris: Secondary | ICD-10-CM | POA: Diagnosis not present

## 2013-08-10 DIAGNOSIS — B3731 Acute candidiasis of vulva and vagina: Secondary | ICD-10-CM | POA: Diagnosis not present

## 2013-08-10 DIAGNOSIS — B373 Candidiasis of vulva and vagina: Secondary | ICD-10-CM | POA: Diagnosis not present

## 2013-08-15 ENCOUNTER — Ambulatory Visit: Payer: Medicare Other | Admitting: Internal Medicine

## 2013-08-16 ENCOUNTER — Other Ambulatory Visit (INDEPENDENT_AMBULATORY_CARE_PROVIDER_SITE_OTHER): Payer: Medicare Other

## 2013-08-16 ENCOUNTER — Encounter: Payer: Self-pay | Admitting: Internal Medicine

## 2013-08-16 ENCOUNTER — Ambulatory Visit (INDEPENDENT_AMBULATORY_CARE_PROVIDER_SITE_OTHER): Payer: Medicare Other | Admitting: Internal Medicine

## 2013-08-16 VITALS — BP 122/62 | HR 73 | Temp 97.9°F | Wt 253.0 lb

## 2013-08-16 DIAGNOSIS — E538 Deficiency of other specified B group vitamins: Secondary | ICD-10-CM | POA: Diagnosis not present

## 2013-08-16 DIAGNOSIS — Z23 Encounter for immunization: Secondary | ICD-10-CM | POA: Diagnosis not present

## 2013-08-16 DIAGNOSIS — D509 Iron deficiency anemia, unspecified: Secondary | ICD-10-CM

## 2013-08-16 DIAGNOSIS — E119 Type 2 diabetes mellitus without complications: Secondary | ICD-10-CM

## 2013-08-16 DIAGNOSIS — I10 Essential (primary) hypertension: Secondary | ICD-10-CM

## 2013-08-16 DIAGNOSIS — M48061 Spinal stenosis, lumbar region without neurogenic claudication: Secondary | ICD-10-CM

## 2013-08-16 LAB — LIPID PANEL
Cholesterol: 176 mg/dL (ref 0–200)
LDL Cholesterol: 114 mg/dL — ABNORMAL HIGH (ref 0–99)
Triglycerides: 75 mg/dL (ref 0.0–149.0)

## 2013-08-16 MED ORDER — CYANOCOBALAMIN 1000 MCG/ML IJ SOLN
1000.0000 ug | Freq: Once | INTRAMUSCULAR | Status: AC
  Administered 2013-08-16: 1000 ug via INTRAMUSCULAR

## 2013-08-16 MED ORDER — ALPRAZOLAM 1 MG PO TABS: 1.0000 mg | ORAL_TABLET | Freq: Two times a day (BID) | ORAL | Status: DC | PRN

## 2013-08-16 MED ORDER — NEBIVOLOL HCL 5 MG PO TABS: ORAL_TABLET | ORAL | Status: DC

## 2013-08-16 MED ORDER — OLMESARTAN MEDOXOMIL-HCTZ 40-25 MG PO TABS: ORAL_TABLET | ORAL | Status: DC

## 2013-08-16 NOTE — Assessment & Plan Note (Signed)
Chronic pain - works with Ramos on same Planning surgical intervention with NSurg Yetta Barre 09/05/13 Reviewed interval hx today

## 2013-08-16 NOTE — Assessment & Plan Note (Signed)
BP Readings from Last 3 Encounters:  08/16/13 122/62  03/20/13 94/45  03/07/13 110/72   The current medical regimen is effective;  continue present plan and medications.

## 2013-08-16 NOTE — Progress Notes (Signed)
Subjective:    Patient ID: Cynthia Kirk, female    DOB: 02/15/1952, 62 y.o.   MRN: [id number]  HPI Here for follow up - reviewed chronic medical issues:  hypertension - reports compliance with ongoing medical treatment and no changes in medication dose or frequency. denies adverse side effects related to current therapy.   DM2 - diet controlled since off steroids- reports compliance with ongoing diet control - denies adverse side effects related to current therapy. checks sugars each am   GERD - reports compliance with ongoing medical treatment and no changes in medication dose or frequency. denies adverse side effects related to current therapy. No abdominal pain   Past Medical History  Diagnosis Date  . Hiatal hernia   . Hypertension   . Type II or unspecified type diabetes mellitus without mention of complication, not stated as uncontrolled   . Morbid obesity 2010  . Iron deficiency anemia, unspecified   . Esophageal reflux   . Allergic rhinitis, cause unspecified   . Obstructive sleep apnea (adult) (pediatric)   . Unspecified asthma(493.90)   . Sarcoidosis 1996    @ [employer]  . Unspecified menopausal and postmenopausal disorder   . Intestinal infection due to Clostridium difficile   . Iron deficiency anemia   . Syncope     ?? Syncope ??  . Ejection fraction     EF 45-50%, echo, October 27, 2011  . H/O menorrhagia 2011  . Endometrial mass 2011  . Urinary incontinence, mixed 2011  . Simple endometrial hyperplasia 2011  . Herpes simplex type 2 infection complicating pregnancy   . ASCUS with positive high risk HPV   . PMB (postmenopausal bleeding) 05/13/2010  . H/O goiter   . VIN II (vulvar intraepithelial neoplasia II)   . Fibroid   . Migraine   . Pneumonia 2005,2007-2011  . Anxiety   . H/O Clostridium difficile infection 03/2010  . Adenomatous colon polyp   . Blood transfusion 1976    Due to ectopic pregnancy  . Abnormal Pap smear 2008  . H/O varicella   . History of  measles, mumps, or rubella   . Pain, pelvic, female 2010  . TMJ (dislocation of temporomandibular joint)   . Hx of migraines     Review of Systems  Constitutional: Positive for fatigue. Negative for unexpected weight change.  Cardiovascular: Negative for chest pain and palpitations.  Musculoskeletal: Negative for joint swelling. Back pain: chronic.       Objective:   Physical Exam BP 122/62  Pulse 73  Temp(Src) 97.9 F (36.6 C) (Oral)  Wt 253 lb (114.76 kg)  BMI 43.41 kg/m2  SpO2 97% Wt Readings from Last 3 Encounters:  08/16/13 253 lb (114.76 kg)  03/07/13 254 lb (115.214 kg)  01/29/13 250 lb (113.399 kg)   Constitutional: She is overweight; appears well-developed and well-nourished. No distress.  Cardiovascular: Normal rate, regular rhythm and normal heart sounds.  No murmur heard. No BLE edema. Pulmonary/Chest: Effort normal and breath sounds normal. No respiratory distress. She has no wheezes.  Abdomen: obese, S NT, ND, +BS Psychiatric: She has a normal mood and affect. Her behavior is normal. Judgment and thought content normal.   Lab Results  Component Value Date   WBC 6.4 03/20/2013   HGB 12.4 03/20/2013   HCT 36.2 03/20/2013   PLT 250 03/20/2013   CHOL 180 06/20/2012   TRIG 48.0 06/20/2012   HDL 59.60 06/20/2012   ALT 9 03/20/2013   AST 19 03/20/2013   NA  136 03/20/2013   K 3.6 03/20/2013   CL 97 03/20/2013   CREATININE 0.92 03/20/2013   BUN 18 03/20/2013   CO2 30 03/20/2013   TSH 0.96 01/29/2013   HGBA1C 5.6 01/29/2013        Assessment & Plan:   See problem list. Medications and labs reviewed today.

## 2013-08-16 NOTE — Patient Instructions (Signed)
It was good to see you today. We have reviewed your prior records including labs and tests today Test(s) ordered today. Your results will be released to MyChart (or called to you) after review, usually within 72hours after test completion. If any changes need to be made, you will be notified at that same time. Medications reviewed, no changes at this time. Refill on medication(s) as discussed today. Please schedule followup in 6 months for weight, diabetes mellitus and blood pressure check, call sooner if problems.  Your annual flu shot was given and/or updated today.

## 2013-08-16 NOTE — Assessment & Plan Note (Signed)
Diet controlled - but weight gain noted check a1c now Lab Results  Component Value Date   HGBA1C 5.6 01/29/2013

## 2013-08-23 ENCOUNTER — Encounter: Payer: Self-pay | Admitting: Internal Medicine

## 2013-08-24 ENCOUNTER — Encounter (HOSPITAL_COMMUNITY): Payer: Self-pay | Admitting: Pharmacy Technician

## 2013-08-27 MED ORDER — HYDROQUINONE 4 % EX CREA: TOPICAL_CREAM | Freq: Two times a day (BID) | CUTANEOUS | Status: DC

## 2013-08-28 NOTE — Pre-Procedure Instructions (Signed)
Cynthia Kirk  08/28/2013   Your procedure is scheduled on:  Wednesday, October 1st  Report to Main Entrance "A" at 0630 AM.  Call this number if you have problems the morning of surgery: [phone]   Remember:   Do not eat food or drink liquids after midnight.   Take these medicines the morning of surgery with A SIP OF WATER: bystolic, vesicare, albuterol if needed, xanax if needed, dexilant, pain medication if needed   Do not wear jewelry, make-up or nail polish.  Do not wear lotions, powders, or perfumes. You may wear deodorant.  Do not shave 48 hours prior to surgery. Men may shave face and neck.  Do not bring valuables to the hospital.  [facility] is not responsible  for any belongings or valuables.  Contacts, dentures or bridgework may not be worn into surgery.  Leave suitcase in the car. After surgery it may be brought to your room.  For patients admitted to the hospital, checkout time is 11:00 AM the day of discharge.   Patients discharged the day of surgery will not be allowed to drive home.   Special Instructions: Shower using CHG 2 nights before surgery and the night before surgery.  If you shower the day of surgery use CHG.  Use special wash - you have one bottle of CHG for all showers.  You should use approximately 1/3 of the bottle for each shower.   Please read over the following fact sheets that you were given: Pain Booklet, Coughing and Deep Breathing, Blood Transfusion Information, MRSA Information and Surgical Site Infection Prevention

## 2013-08-29 ENCOUNTER — Encounter (HOSPITAL_COMMUNITY)
Admission: RE | Admit: 2013-08-29 | Discharge: 2013-08-29 | Disposition: A | Discharge status: Home / Self Care | Payer: Medicare Other | Source: Ambulatory Visit | Attending: Neurological Surgery | Admitting: Neurological Surgery

## 2013-08-29 ENCOUNTER — Encounter (HOSPITAL_COMMUNITY): Payer: Self-pay

## 2013-08-29 DIAGNOSIS — Z01818 Encounter for other preprocedural examination: Secondary | ICD-10-CM | POA: Insufficient documentation

## 2013-08-29 DIAGNOSIS — Z01812 Encounter for preprocedural laboratory examination: Secondary | ICD-10-CM | POA: Diagnosis not present

## 2013-08-29 DIAGNOSIS — Z01810 Encounter for preprocedural cardiovascular examination: Secondary | ICD-10-CM | POA: Diagnosis not present

## 2013-08-29 LAB — TYPE AND SCREEN
ABO/RH(D): O POS
Antibody Screen: NEGATIVE

## 2013-08-29 LAB — CBC
Hemoglobin: 11.4 g/dL — ABNORMAL LOW (ref 12.0–15.0)
MCV: 88.8 fL (ref 78.0–100.0)
Platelets: 215 10*3/uL (ref 150–400)

## 2013-08-29 LAB — BASIC METABOLIC PANEL
BUN: 25 mg/dL — ABNORMAL HIGH (ref 6–23)
CO2: 28 mEq/L (ref 19–32)
Chloride: 99 mEq/L (ref 96–112)
Creatinine, Ser: 1.11 mg/dL — ABNORMAL HIGH (ref 0.50–1.10)
Glucose, Bld: 77 mg/dL (ref 70–99)
Potassium: 3.1 mEq/L — ABNORMAL LOW (ref 3.5–5.1)

## 2013-08-29 LAB — ABO/RH: ABO/RH(D): O POS

## 2013-08-29 LAB — SURGICAL PCR SCREEN: MRSA, PCR: NEGATIVE

## 2013-08-30 NOTE — Progress Notes (Signed)
Anesthesia Chart Review:  Patient is a 62 year old female scheduled for T11-12 laminectomy, L4-5 maximum access surgery PLIF on 09/05/13 by Dr. Yetta Barre.    History includes morbid obesity, HTN, DM2 diet controlled, GERD, hiatal hernia, iron deficiency anemia, OSA with CPAP use, asthma, Sarcoidosis, TMJ, migraines, anxiety, non-smoker, vulvar intraepithelial neoplasia II s/p vulvectomy. She was evaluated by cardiologist Dr. 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  Her event monitor was unremarkable.  Echo showed EF 45-50%, so there was mention consideration for a cardiac MRI (due to history of Sarcoidosis) and stress test--neither of which were done.  She has since been followed on a regular basis by her PCP and pulmonologist.  PCP is Dr. Rene Paci, last visit 08/16/13, who is aware of plans for surgery. Pulmonologist is Dr. Craige Cotta, last visit 01/2013.   EKG on 08/29/13 showed NSR.  Echo on 10/27/11 showed: Left ventricle: The cavity size was normal. Wall thickness was normal. Systolic function was mildly reduced. The estimated ejection fraction was in the range of 45% to 50%. Wall motion was normal; there were no regional wall motion abnormalities. Left ventricular diastolic function parameters were normal.   Cardiac event monitor is 11/2011 showed NSR--no brady or tachy.  CXR on 08/29/13 showed: Heart size and vascularity are normal. Chronic interstitial scarring in the upper lobes is stable. No mass or adenopathy. Negative for pneumonia or effusion. Negative for heart failure. No acute abnormality and no change from the prior study.  Preoperative labs noted. (A1C was normal at 5.4 on 08/16/13.)  Patient with multiple co-morbidities but with recent evaluation by Dr. Felicity Coyer at [facility] and pulmonology follow-up within the past year.  Her DM is very well controlled, and there was no reported chest pain at her recent PAT and PCP  visit.  Dr. Felicity Coyer is aware of plans for surgery.  I reviewed history and 2012 cardiology records with anesthesiologist Dr. Krista Blue. Since patient without known CAD/MI/CHF history, cardiology visit was nearly two years ago, EKG unremarkable, and she has had regular and recent follow-up with Dr. Felicity Coyer then it is anticipated that she can proceed as planned if no acute changes.    Velna Ochs [facility]/Anesthesiology Phone [phone] 08/30/2013 3:02 PM

## 2013-09-04 MED ORDER — CEFAZOLIN SODIUM-DEXTROSE 2-3 GM-% IV SOLR
2.0000 g | INTRAVENOUS | Status: AC
  Administered 2013-09-05: 2 g via INTRAVENOUS
  Filled 2013-09-04: qty 50

## 2013-09-04 NOTE — Progress Notes (Signed)
I SPOKE WITH PATIENT AND NOTIFIED HER OF NEW ARRIVAL TIME OF 0730.

## 2013-09-05 ENCOUNTER — Encounter (HOSPITAL_COMMUNITY): Payer: Self-pay | Admitting: *Deleted

## 2013-09-05 ENCOUNTER — Inpatient Hospital Stay (HOSPITAL_COMMUNITY): Payer: Medicare Other | Admitting: Certified Registered Nurse Anesthetist

## 2013-09-05 ENCOUNTER — Encounter (HOSPITAL_COMMUNITY)
Admission: RE | Disposition: A | Discharge status: Home / Self Care | Payer: Self-pay | Source: Ambulatory Visit | Attending: Neurological Surgery

## 2013-09-05 ENCOUNTER — Inpatient Hospital Stay (HOSPITAL_COMMUNITY): Payer: Medicare Other

## 2013-09-05 ENCOUNTER — Inpatient Hospital Stay (HOSPITAL_COMMUNITY)
Admission: RE | Admit: 2013-09-05 | Discharge: 2013-09-11 | DRG: 460 | Disposition: A | Discharge status: Home / Self Care | Payer: Medicare Other | Source: Ambulatory Visit | Attending: Neurological Surgery | Admitting: Neurological Surgery

## 2013-09-05 ENCOUNTER — Encounter (HOSPITAL_COMMUNITY): Payer: Self-pay | Admitting: Vascular Surgery

## 2013-09-05 DIAGNOSIS — K219 Gastro-esophageal reflux disease without esophagitis: Secondary | ICD-10-CM | POA: Diagnosis present

## 2013-09-05 DIAGNOSIS — D509 Iron deficiency anemia, unspecified: Secondary | ICD-10-CM | POA: Diagnosis not present

## 2013-09-05 DIAGNOSIS — M549 Dorsalgia, unspecified: Secondary | ICD-10-CM | POA: Diagnosis not present

## 2013-09-05 DIAGNOSIS — M4310 Spondylolisthesis, site unspecified: Secondary | ICD-10-CM | POA: Diagnosis present

## 2013-09-05 DIAGNOSIS — M48061 Spinal stenosis, lumbar region without neurogenic claudication: Secondary | ICD-10-CM | POA: Diagnosis not present

## 2013-09-05 DIAGNOSIS — Q762 Congenital spondylolisthesis: Secondary | ICD-10-CM | POA: Diagnosis not present

## 2013-09-05 DIAGNOSIS — M4804 Spinal stenosis, thoracic region: Secondary | ICD-10-CM | POA: Diagnosis not present

## 2013-09-05 DIAGNOSIS — Z6841 Body Mass Index (BMI) 40.0 and over, adult: Secondary | ICD-10-CM

## 2013-09-05 DIAGNOSIS — Z981 Arthrodesis status: Secondary | ICD-10-CM

## 2013-09-05 DIAGNOSIS — M519 Unspecified thoracic, thoracolumbar and lumbosacral intervertebral disc disorder: Secondary | ICD-10-CM | POA: Diagnosis not present

## 2013-09-05 DIAGNOSIS — Z452 Encounter for adjustment and management of vascular access device: Secondary | ICD-10-CM | POA: Diagnosis not present

## 2013-09-05 DIAGNOSIS — M810 Age-related osteoporosis without current pathological fracture: Secondary | ICD-10-CM | POA: Diagnosis present

## 2013-09-05 DIAGNOSIS — R079 Chest pain, unspecified: Secondary | ICD-10-CM | POA: Diagnosis not present

## 2013-09-05 HISTORY — PX: MAXIMUM ACCESS (MAS)POSTERIOR LUMBAR INTERBODY FUSION (PLIF) 1 LEVEL: SHX6368

## 2013-09-05 HISTORY — DX: Type 2 diabetes mellitus without complications: E11.9

## 2013-09-05 HISTORY — PX: THORACIC LAMINECTOMY: SHX96

## 2013-09-05 HISTORY — DX: Dorsalgia, unspecified: M54.9

## 2013-09-05 HISTORY — DX: Spinal stenosis, lumbar region without neurogenic claudication: M48.061

## 2013-09-05 HISTORY — PX: POSTERIOR LUMBAR FUSION: SHX6036

## 2013-09-05 HISTORY — DX: Other chronic pain: G89.29

## 2013-09-05 HISTORY — DX: Panic disorder (episodic paroxysmal anxiety): F41.0

## 2013-09-05 HISTORY — PX: POSTERIOR LAMINECTOMY / DECOMPRESSION LUMBAR SPINE: SUR740

## 2013-09-05 HISTORY — PX: LUMBAR LAMINECTOMY/DECOMPRESSION MICRODISCECTOMY: SHX5026

## 2013-09-05 SURGERY — FOR MAXIMUM ACCESS (MAS) POSTERIOR LUMBAR INTERBODY FUSION (PLIF) 1 LEVEL
Anesthesia: General | Site: Back | Wound class: Clean

## 2013-09-05 MED ORDER — ACETAMINOPHEN 325 MG PO TABS: 650.0000 mg | ORAL_TABLET | ORAL | Status: DC | PRN

## 2013-09-05 MED ORDER — THROMBIN 5000 UNITS EX SOLR
OROMUCOSAL | Status: DC | PRN
  Administered 2013-09-05 (×2): via TOPICAL

## 2013-09-05 MED ORDER — ALBUTEROL SULFATE (5 MG/ML) 0.5% IN NEBU: 2.5000 mg | INHALATION_SOLUTION | RESPIRATORY_TRACT | Status: DC | PRN

## 2013-09-05 MED ORDER — ALBUTEROL SULFATE HFA 108 (90 BASE) MCG/ACT IN AERS
2.0000 | INHALATION_SPRAY | Freq: Four times a day (QID) | RESPIRATORY_TRACT | Status: DC | PRN
  Filled 2013-09-05: qty 6.7

## 2013-09-05 MED ORDER — POLYETHYLENE GLYCOL 3350 17 G PO PACK
17.0000 g | PACK | Freq: Two times a day (BID) | ORAL | Status: DC
  Administered 2013-09-05 – 2013-09-09 (×7): 17 g via ORAL
  Filled 2013-09-05 (×13): qty 1

## 2013-09-05 MED ORDER — ALBUMIN HUMAN 5 % IV SOLN
12.5000 g | Freq: Once | INTRAVENOUS | Status: AC
  Administered 2013-09-05: 12.5 g via INTRAVENOUS

## 2013-09-05 MED ORDER — METHOCARBAMOL 500 MG PO TABS
ORAL_TABLET | ORAL | Status: AC
  Filled 2013-09-05: qty 1

## 2013-09-05 MED ORDER — ADULT MULTIVITAMIN W/MINERALS CH
1.0000 | ORAL_TABLET | Freq: Every day | ORAL | Status: DC
  Administered 2013-09-05 – 2013-09-11 (×7): 1 via ORAL
  Filled 2013-09-05 (×7): qty 1

## 2013-09-05 MED ORDER — LIDOCAINE HCL (CARDIAC) 20 MG/ML IV SOLN
INTRAVENOUS | Status: DC | PRN
  Administered 2013-09-05: 50 mg via INTRATRACHEAL

## 2013-09-05 MED ORDER — NEBIVOLOL HCL 5 MG PO TABS
5.0000 mg | ORAL_TABLET | Freq: Every day | ORAL | Status: DC
  Administered 2013-09-05 – 2013-09-08 (×3): 5 mg via ORAL
  Filled 2013-09-05 (×7): qty 1

## 2013-09-05 MED ORDER — PHENOL 1.4 % MT LIQD: 1.0000 | OROMUCOSAL | Status: DC | PRN

## 2013-09-05 MED ORDER — ARTIFICIAL TEARS OP OINT
TOPICAL_OINTMENT | OPHTHALMIC | Status: DC | PRN
  Administered 2013-09-05: 1 via OPHTHALMIC

## 2013-09-05 MED ORDER — ONDANSETRON HCL 4 MG/2ML IJ SOLN
INTRAMUSCULAR | Status: DC | PRN
  Administered 2013-09-05: 4 mg via INTRAVENOUS

## 2013-09-05 MED ORDER — OLMESARTAN MEDOXOMIL-HCTZ 40-25 MG PO TABS: 1.0000 | ORAL_TABLET | Freq: Every day | ORAL | Status: DC

## 2013-09-05 MED ORDER — HYDROCHLOROTHIAZIDE 25 MG PO TABS
25.0000 mg | ORAL_TABLET | Freq: Every day | ORAL | Status: DC
  Administered 2013-09-06 – 2013-09-11 (×6): 25 mg via ORAL
  Filled 2013-09-05 (×6): qty 1

## 2013-09-05 MED ORDER — PROPOFOL INFUSION 10 MG/ML OPTIME
INTRAVENOUS | Status: DC | PRN
  Administered 2013-09-05: 13:00:00 via INTRAVENOUS
  Administered 2013-09-05: 50 ug/kg/min via INTRAVENOUS

## 2013-09-05 MED ORDER — KETOROLAC TROMETHAMINE 30 MG/ML IJ SOLN
INTRAMUSCULAR | Status: AC
  Administered 2013-09-05: 15 mg
  Filled 2013-09-05: qty 1

## 2013-09-05 MED ORDER — MENTHOL 3 MG MT LOZG
1.0000 | LOZENGE | OROMUCOSAL | Status: DC | PRN
  Filled 2013-09-05 (×2): qty 9

## 2013-09-05 MED ORDER — MEDROXYPROGESTERONE ACETATE 10 MG PO TABS: 10.0000 mg | ORAL_TABLET | Freq: Every day | ORAL | Status: DC

## 2013-09-05 MED ORDER — POTASSIUM CHLORIDE IN NACL 20-0.9 MEQ/L-% IV SOLN
INTRAVENOUS | Status: DC
  Administered 2013-09-05 – 2013-09-09 (×4): via INTRAVENOUS
  Filled 2013-09-05 (×13): qty 1000

## 2013-09-05 MED ORDER — FLUTICASONE PROPIONATE HFA 44 MCG/ACT IN AERO
2.0000 | INHALATION_SPRAY | Freq: Two times a day (BID) | RESPIRATORY_TRACT | Status: DC
  Administered 2013-09-06 – 2013-09-11 (×10): 2 via RESPIRATORY_TRACT
  Filled 2013-09-05 (×2): qty 10.6

## 2013-09-05 MED ORDER — LACTATED RINGERS IV SOLN: INTRAVENOUS | Status: DC

## 2013-09-05 MED ORDER — DARIFENACIN HYDROBROMIDE ER 7.5 MG PO TB24
7.5000 mg | ORAL_TABLET | Freq: Every day | ORAL | Status: DC
  Administered 2013-09-05 – 2013-09-06 (×2): 7.5 mg via ORAL
  Filled 2013-09-05 (×5): qty 1

## 2013-09-05 MED ORDER — SENNA 8.6 MG PO TABS
1.0000 | ORAL_TABLET | Freq: Two times a day (BID) | ORAL | Status: DC
  Administered 2013-09-05 – 2013-09-10 (×7): 8.6 mg via ORAL
  Filled 2013-09-05 (×17): qty 1

## 2013-09-05 MED ORDER — ALBUMIN HUMAN 5 % IV SOLN
INTRAVENOUS | Status: AC
  Filled 2013-09-05: qty 250

## 2013-09-05 MED ORDER — MORPHINE SULFATE 2 MG/ML IJ SOLN
1.0000 mg | INTRAMUSCULAR | Status: DC | PRN
  Administered 2013-09-05 – 2013-09-06 (×3): 2 mg via INTRAVENOUS
  Filled 2013-09-05 (×3): qty 1

## 2013-09-05 MED ORDER — DEXAMETHASONE 4 MG PO TABS
4.0000 mg | ORAL_TABLET | Freq: Four times a day (QID) | ORAL | Status: DC
  Administered 2013-09-05 – 2013-09-10 (×18): 4 mg via ORAL
  Filled 2013-09-05 (×23): qty 1

## 2013-09-05 MED ORDER — PROPOFOL 10 MG/ML IV BOLUS
INTRAVENOUS | Status: DC | PRN
  Administered 2013-09-05: 180 mg via INTRAVENOUS
  Administered 2013-09-05: 20 mg via INTRAVENOUS

## 2013-09-05 MED ORDER — OXYCODONE HCL 5 MG/5ML PO SOLN: 5.0000 mg | Freq: Once | ORAL | Status: AC | PRN

## 2013-09-05 MED ORDER — SODIUM CHLORIDE 0.9 % IJ SOLN
3.0000 mL | Freq: Two times a day (BID) | INTRAMUSCULAR | Status: DC
  Administered 2013-09-06 – 2013-09-10 (×5): 3 mL via INTRAVENOUS

## 2013-09-05 MED ORDER — POLYETHYLENE GLYCOL 3350 17 GM/SCOOP PO POWD
17.0000 g | Freq: Two times a day (BID) | ORAL | Status: DC
  Filled 2013-09-05 (×2): qty 255

## 2013-09-05 MED ORDER — BUPIVACAINE HCL (PF) 0.25 % IJ SOLN
INTRAMUSCULAR | Status: DC | PRN
  Administered 2013-09-05: 10 mL

## 2013-09-05 MED ORDER — DEXAMETHASONE SODIUM PHOSPHATE 4 MG/ML IJ SOLN
4.0000 mg | Freq: Four times a day (QID) | INTRAMUSCULAR | Status: DC
  Administered 2013-09-06: 4 mg via INTRAVENOUS
  Filled 2013-09-05 (×9): qty 1

## 2013-09-05 MED ORDER — KETOROLAC TROMETHAMINE 15 MG/ML IJ SOLN
15.0000 mg | Freq: Once | INTRAMUSCULAR | Status: DC
  Filled 2013-09-05: qty 1

## 2013-09-05 MED ORDER — FLUTICASONE PROPIONATE HFA 44 MCG/ACT IN AERO: 2.0000 | INHALATION_SPRAY | Freq: Two times a day (BID) | RESPIRATORY_TRACT | Status: DC

## 2013-09-05 MED ORDER — SODIUM CHLORIDE 0.9 % IV SOLN: 250.0000 mL | INTRAVENOUS | Status: DC

## 2013-09-05 MED ORDER — ONDANSETRON HCL 4 MG/2ML IJ SOLN: 4.0000 mg | Freq: Four times a day (QID) | INTRAMUSCULAR | Status: DC | PRN

## 2013-09-05 MED ORDER — EPHEDRINE SULFATE 50 MG/ML IJ SOLN
INTRAMUSCULAR | Status: DC | PRN
  Administered 2013-09-05 (×5): 5 mg via INTRAVENOUS

## 2013-09-05 MED ORDER — CEFAZOLIN SODIUM 1-5 GM-% IV SOLN
1.0000 g | Freq: Three times a day (TID) | INTRAVENOUS | Status: AC
  Administered 2013-09-05 – 2013-09-06 (×2): 1 g via INTRAVENOUS
  Filled 2013-09-05 (×2): qty 50

## 2013-09-05 MED ORDER — THROMBIN 20000 UNITS EX SOLR
CUTANEOUS | Status: DC | PRN
  Administered 2013-09-05: 12:00:00 via TOPICAL

## 2013-09-05 MED ORDER — PANTOPRAZOLE SODIUM 40 MG PO TBEC
40.0000 mg | DELAYED_RELEASE_TABLET | Freq: Every day | ORAL | Status: DC
  Administered 2013-09-06 – 2013-09-11 (×6): 40 mg via ORAL
  Filled 2013-09-05 (×5): qty 1

## 2013-09-05 MED ORDER — ACETAMINOPHEN 650 MG RE SUPP: 650.0000 mg | RECTAL | Status: DC | PRN

## 2013-09-05 MED ORDER — SUCCINYLCHOLINE CHLORIDE 20 MG/ML IJ SOLN
INTRAMUSCULAR | Status: DC | PRN
  Administered 2013-09-05: 140 mg via INTRAVENOUS

## 2013-09-05 MED ORDER — OXYCODONE-ACETAMINOPHEN 5-325 MG PO TABS
1.0000 | ORAL_TABLET | ORAL | Status: DC | PRN
  Filled 2013-09-05: qty 2

## 2013-09-05 MED ORDER — HYDROQUINONE 4 % EX CREA: TOPICAL_CREAM | Freq: Two times a day (BID) | CUTANEOUS | Status: DC

## 2013-09-05 MED ORDER — MIDAZOLAM HCL 5 MG/5ML IJ SOLN
INTRAMUSCULAR | Status: DC | PRN
  Administered 2013-09-05: 2 mg via INTRAVENOUS

## 2013-09-05 MED ORDER — HYDROMORPHONE HCL PF 1 MG/ML IJ SOLN
INTRAMUSCULAR | Status: AC
  Filled 2013-09-05: qty 1

## 2013-09-05 MED ORDER — METHOCARBAMOL 100 MG/ML IJ SOLN
500.0000 mg | Freq: Four times a day (QID) | INTRAVENOUS | Status: DC | PRN
  Filled 2013-09-05: qty 5

## 2013-09-05 MED ORDER — SODIUM CHLORIDE 0.9 % IJ SOLN: 3.0000 mL | INTRAMUSCULAR | Status: DC | PRN

## 2013-09-05 MED ORDER — FENTANYL CITRATE 0.05 MG/ML IJ SOLN
INTRAMUSCULAR | Status: DC | PRN
  Administered 2013-09-05: 50 ug via INTRAVENOUS
  Administered 2013-09-05: 100 ug via INTRAVENOUS
  Administered 2013-09-05 (×2): 50 ug via INTRAVENOUS
  Administered 2013-09-05: 100 ug via INTRAVENOUS

## 2013-09-05 MED ORDER — LACTATED RINGERS IV SOLN
INTRAVENOUS | Status: DC | PRN
  Administered 2013-09-05 (×3): via INTRAVENOUS

## 2013-09-05 MED ORDER — OXYCODONE HCL 5 MG PO TABS
5.0000 mg | ORAL_TABLET | Freq: Once | ORAL | Status: AC | PRN
  Administered 2013-09-05: 5 mg via ORAL

## 2013-09-05 MED ORDER — MONTELUKAST SODIUM 10 MG PO TABS
5.0000 mg | ORAL_TABLET | Freq: Every day | ORAL | Status: DC
  Filled 2013-09-05: qty 0.5

## 2013-09-05 MED ORDER — IRBESARTAN 300 MG PO TABS
300.0000 mg | ORAL_TABLET | Freq: Every day | ORAL | Status: DC
  Administered 2013-09-06 – 2013-09-11 (×5): 300 mg via ORAL
  Filled 2013-09-05 (×6): qty 1

## 2013-09-05 MED ORDER — 0.9 % SODIUM CHLORIDE (POUR BTL) OPTIME
TOPICAL | Status: DC | PRN
  Administered 2013-09-05: 1000 mL

## 2013-09-05 MED ORDER — OXYCODONE HCL 5 MG PO TABS
ORAL_TABLET | ORAL | Status: AC
  Filled 2013-09-05: qty 1

## 2013-09-05 MED ORDER — MONTELUKAST SODIUM 5 MG PO CHEW
5.0000 mg | CHEWABLE_TABLET | Freq: Every day | ORAL | Status: DC
  Administered 2013-09-05 – 2013-09-10 (×6): 5 mg via ORAL
  Filled 2013-09-05 (×7): qty 1

## 2013-09-05 MED ORDER — SODIUM CHLORIDE 0.9 % IR SOLN
Status: DC | PRN
  Administered 2013-09-05: 11:00:00

## 2013-09-05 MED ORDER — METHOCARBAMOL 500 MG PO TABS
500.0000 mg | ORAL_TABLET | Freq: Four times a day (QID) | ORAL | Status: DC | PRN
  Administered 2013-09-05 – 2013-09-10 (×3): 500 mg via ORAL
  Filled 2013-09-05 (×2): qty 1

## 2013-09-05 MED ORDER — ALPRAZOLAM 0.5 MG PO TABS
1.0000 mg | ORAL_TABLET | Freq: Two times a day (BID) | ORAL | Status: DC | PRN
  Administered 2013-09-10: 1 mg via ORAL
  Filled 2013-09-05: qty 2

## 2013-09-05 MED ORDER — HYDROMORPHONE HCL PF 1 MG/ML IJ SOLN
0.2500 mg | INTRAMUSCULAR | Status: DC | PRN
  Administered 2013-09-05 (×4): 0.5 mg via INTRAVENOUS

## 2013-09-05 MED ORDER — ONDANSETRON HCL 4 MG/2ML IJ SOLN: 4.0000 mg | INTRAMUSCULAR | Status: DC | PRN

## 2013-09-05 SURGICAL SUPPLY — 71 items
BAG DECANTER FOR FLEXI CONT (MISCELLANEOUS) ×2 IMPLANT
BENZOIN TINCTURE PRP APPL 2/3 (GAUZE/BANDAGES/DRESSINGS) ×2 IMPLANT
BLADE SURG ROTATE 9660 (MISCELLANEOUS) IMPLANT
BONE MATRIX OSTEOCEL PRO MED (Bone Implant) ×2 IMPLANT
BUR MATCHSTICK NEURO 3.0 LAGG (BURR) ×2 IMPLANT
CAGE COROENT 9X9X28M-4 (Cage) ×4 IMPLANT
CANISTER SUCTION 2500CC (MISCELLANEOUS) ×2 IMPLANT
CLIP NEUROVISION LG (CLIP) ×2 IMPLANT
CLOTH BEACON ORANGE TIMEOUT ST (SAFETY) IMPLANT
CONT SPEC 4OZ CLIKSEAL STRL BL (MISCELLANEOUS) ×4 IMPLANT
COVER BACK TABLE 24X17X13 BIG (DRAPES) IMPLANT
COVER TABLE BACK 60X90 (DRAPES) ×2 IMPLANT
DRAPE C-ARM 42X72 X-RAY (DRAPES) ×2 IMPLANT
DRAPE C-ARMOR (DRAPES) ×2 IMPLANT
DRAPE LAPAROTOMY 100X72X124 (DRAPES) ×2 IMPLANT
DRAPE MICROSCOPE ZEISS OPMI (DRAPES) IMPLANT
DRAPE POUCH INSTRU U-SHP 10X18 (DRAPES) ×2 IMPLANT
DRAPE SURG 17X23 STRL (DRAPES) ×2 IMPLANT
DRESSING TELFA 8X3 (GAUZE/BANDAGES/DRESSINGS) IMPLANT
DRSG OPSITE 4X5.5 SM (GAUZE/BANDAGES/DRESSINGS) IMPLANT
DRSG OPSITE POSTOP 4X6 (GAUZE/BANDAGES/DRESSINGS) ×4 IMPLANT
DURAPREP 26ML APPLICATOR (WOUND CARE) ×2 IMPLANT
ELECT BLADE 4.0 EZ CLEAN MEGAD (MISCELLANEOUS) ×2
ELECT REM PT RETURN 9FT ADLT (ELECTROSURGICAL) ×2
ELECTRODE BLDE 4.0 EZ CLN MEGD (MISCELLANEOUS) ×1 IMPLANT
ELECTRODE REM PT RTRN 9FT ADLT (ELECTROSURGICAL) ×1 IMPLANT
EVACUATOR 1/8 PVC DRAIN (DRAIN) ×2 IMPLANT
GAUZE SPONGE 4X4 16PLY XRAY LF (GAUZE/BANDAGES/DRESSINGS) IMPLANT
GLOVE BIO SURGEON STRL SZ8 (GLOVE) IMPLANT
GLOVE BIOGEL PI IND STRL 7.5 (GLOVE) ×2 IMPLANT
GLOVE BIOGEL PI IND STRL 8.5 (GLOVE) ×5 IMPLANT
GLOVE BIOGEL PI INDICATOR 7.5 (GLOVE) ×2
GLOVE BIOGEL PI INDICATOR 8.5 (GLOVE) ×5
GLOVE SURG SS PI 7.5 STRL IVOR (GLOVE) ×6 IMPLANT
GLOVE SURG SS PI 8.0 STRL IVOR (GLOVE) ×16 IMPLANT
GOWN BRE IMP SLV AUR LG STRL (GOWN DISPOSABLE) IMPLANT
GOWN BRE IMP SLV AUR XL STRL (GOWN DISPOSABLE) ×6 IMPLANT
GOWN STRL REIN 2XL LVL4 (GOWN DISPOSABLE) ×4 IMPLANT
HEMOSTAT POWDER KIT SURGIFOAM (HEMOSTASIS) ×4 IMPLANT
KIT BASIN OR (CUSTOM PROCEDURE TRAY) ×2 IMPLANT
KIT NEEDLE NVM5 EMG ELECT (KITS) ×1 IMPLANT
KIT NEEDLE NVM5 EMG ELECTRODE (KITS) ×1
KIT ROOM TURNOVER OR (KITS) ×2 IMPLANT
MILL MEDIUM DISP (BLADE) ×2 IMPLANT
NEEDLE HYPO 25X1 1.5 SAFETY (NEEDLE) ×2 IMPLANT
NEEDLE SPNL 20GX3.5 QUINCKE YW (NEEDLE) ×2 IMPLANT
NS IRRIG 1000ML POUR BTL (IV SOLUTION) ×2 IMPLANT
PACK LAMINECTOMY NEURO (CUSTOM PROCEDURE TRAY) ×2 IMPLANT
PAD ARMBOARD 7.5X6 YLW CONV (MISCELLANEOUS) ×6 IMPLANT
ROD 35MM (Rod) ×4 IMPLANT
RUBBERBAND STERILE (MISCELLANEOUS) IMPLANT
SCREW LOCK (Screw) ×4 IMPLANT
SCREW LOCK FXNS SPNE MAS PL (Screw) ×4 IMPLANT
SCREW MAS PLIF 5.5X30 (Screw) ×2 IMPLANT
SCREW PAS PLIF 5X30 (Screw) ×2 IMPLANT
SCREW SHANK 5.0X30MM (Screw) ×4 IMPLANT
SCREW TULIP 5.5 (Screw) ×4 IMPLANT
SPONGE LAP 4X18 X RAY DECT (DISPOSABLE) IMPLANT
SPONGE SURGIFOAM ABS GEL 100 (HEMOSTASIS) ×2 IMPLANT
SPONGE SURGIFOAM ABS GEL SZ50 (HEMOSTASIS) IMPLANT
STRIP CLOSURE SKIN 1/2X4 (GAUZE/BANDAGES/DRESSINGS) ×2 IMPLANT
SUT VIC AB 0 CT1 18XCR BRD8 (SUTURE) ×2 IMPLANT
SUT VIC AB 0 CT1 8-18 (SUTURE) ×2
SUT VIC AB 2-0 CP2 18 (SUTURE) ×4 IMPLANT
SUT VIC AB 3-0 SH 8-18 (SUTURE) ×4 IMPLANT
SYR 20ML ECCENTRIC (SYRINGE) ×2 IMPLANT
TOWEL OR 17X24 6PK STRL BLUE (TOWEL DISPOSABLE) ×2 IMPLANT
TOWEL OR 17X26 10 PK STRL BLUE (TOWEL DISPOSABLE) ×2 IMPLANT
TRAY FOLEY BAG SILVER LF 14FR (CATHETERS) ×2 IMPLANT
TRAY FOLEY CATH 14FRSI W/METER (CATHETERS) IMPLANT
WATER STERILE IRR 1000ML POUR (IV SOLUTION) ×2 IMPLANT

## 2013-09-05 NOTE — Op Note (Signed)
09/05/2013  2:32 PM  PATIENT:  Cynthia Kirk  62 y.o. female  PRE-OPERATIVE DIAGNOSIS:  1. Thoracic stenosis T11-12, 2. Segmental instability with spondylolisthesis L4-5 with lumbar spinal stenosis, back and leg pain  POST-OPERATIVE DIAGNOSIS:  Same  PROCEDURE:   1. Decompressive lumbar laminectomy L4-5 requiring more work than would be required for a simple exposure of the disk for PLIF in order to adequately decompress the neural elements and address the spinal stenosis 2. Through a separate incision, decompressive thoracic laminectomy, medial facetectomy T11-12 for thoracic spinal stenosis 3. Posterior lumbar interbody fusion L4-5 using PEEK interbody cages packed with morcellized allograft and autograft 4. Posterior fixation L4-5 using cortical pedicle screws.    SURGEON:  Marikay Alar, MD  ASSISTANTS: Dr. Lovell Sheehan  ANESTHESIA:  General  EBL: 300 ml  Total I/O In: 2000 [I.V.:2000] Out: 700 [Urine:400; Blood:300]  BLOOD ADMINISTERED:none  DRAINS: Hemovac   INDICATION FOR PROCEDURE: This patient presented with severe back pain with radiation into her legs. She tried medical management quite some time without significant relief. She had an MRI which showed significant thoracic stenosis at T11-12 but also a spondylolisthesis with stenosis at L4-5. Flexion extension film showed that her L4-5 spondylolisthesis was mobile. Recommended a thoracic decompression at T11-12 and also a decompression and instrumented fusion at L4-5. Patient understood the risks, benefits, and alternatives and potential outcomes and wished to proceed.  PROCEDURE DETAILS:  The patient was brought to the operating room. After induction of generalized endotracheal anesthesia the patient was rolled into the prone position on chest rolls and all pressure points were padded. The patient's thoracic and lumbar region was cleaned with Hibiclens and then prepped with DuraPrep and draped in the usual sterile fashion.  Anesthesia was injected and then a dorsal midline incision was made and carried down to the lumbosacral fascia. An incision was also made over T11-12 and carried down to the thoracic fascia. The thoracic fascia was taken down in a subperiosteal fashion to expose T11-12. AP lateral fluoroscopy confirmed my level. Dr. Lovell Sheehan also confirmed he felt I was at the correct level at T11-12.  I removed the spinous process of T11 and then used the high-speed drill to thin the lamina. I performed a complete laminectomy and medial facetectomy at T11-12. There was a large osteophyte coming off of the facet at T11-12 on the right. This caused significant compression of the thecal sac. I very carefully undercut this with a 2 mm Kerrison punch, marching along the gutter to release it and then remove. Once the decompression was complete the dura was full and appeared to be well decompressed. I undercut the lateral recess to decompress the lateral recess. I then irrigated with saline solution and packed a bacitracin soaked sponge into the wound. I turned my attention to the PLIF. The lumbar fascia was opened and the paraspinous musculature was taken down in a subperiosteal fashion to expose L4-5. A self-retaining retractor was placed. Intraoperative fluoroscopy confirmed my level, and I started with placement of the L4 cortical pedicle screws. The pedicle screw entry zones were identified utilizing surface landmarks and  AP and lateral fluoroscopy. I scored the cortex with the high-speed drill and then used the hand drill and EMG monitoring to drill an upward and outward direction into the pedicle. I then tapped line to line, and the tap was also monitored. I then placed a 5-0 x 30 mm cortical pedicle screw into the pedicles of L4 bilaterally. I then turned my attention to the decompression  and the spinous process was removed and complete lumbar laminectomies, hemi- facetectomies, and foraminotomies were performed at L4-5. The  patient had significant spinal stenosis and this required more work than would be required for a simple exposure of the disc for posterior lumbar interbody fusion. Much more generous decompression was undertaken in order to adequately decompress the neural elements and address the patient's leg pain. The yellow ligament was removed to expose the underlying dura and nerve roots, and generous foraminotomies were performed to adequately decompress the neural elements. Both the exiting and traversing nerve roots were decompressed on both sides until a coronary dilator passed easily along the nerve roots. Once the decompression was complete, I turned my attention to the posterior lower lumbar interbody fusion. The epidural venous vasculature was coagulated and cut sharply. Disc space was incised and the initial discectomy was performed with pituitary rongeurs. The disc space was distracted with sequential distractors to a height of 10 mm. We then used a series of scrapers and shavers to prepare the endplates for fusion. The midline was prepared with Epstein curettes. Once the complete discectomy was finished, we packed an appropriate sized peek interbody cage with local autograft and morcellized allograft, gently retracted the nerve root, and tapped the cage into position at L4-5.  The midline between the cages was packed with morselized autograft and allograft. We then turned our attention to the placement of the lower pedicle screws. The pedicle screw entry zones were identified utilizing surface landmarks and fluoroscopy. I drilled into each pedicle utilizing the hand drill and EMG monitoring, and tapped each pedicle with the appropriate tap. We palpated with a ball probe to assure no break in the cortex. We then placed 5-0 by 30 mm pedicle screws into the pedicles bilaterally at L5. We then placed lordotic rods into the multiaxial screw heads of the pedicle screws and locked these in position with the locking caps  and anti-torque device. We then checked our construct with AP and lateral fluoroscopy. Irrigated with copious amounts of bacitracin-containing saline solution. Placed a medium Hemovac drain through separate stab incision. Inspected the nerve roots once again to assure adequate decompression, lined to the dura with Gelfoam, and closed the muscle and the fascia with 0 Vicryl. I also irrigated the thoracic wound once again and closed the muscle and the fascia with 0 Vicryl. Closed the subcutaneous tissues with 2-0 Vicryl and subcuticular tissues with 3-0 Vicryl. The skin was closed with benzoin and Steri-Strips. Dressing was then applied, the patient was awakened from general anesthesia and transported to the recovery room in stable condition. At the end of the procedure all sponge, needle and instrument counts were correct.   PLAN OF CARE: Admit to inpatient   PATIENT DISPOSITION:  PACU - hemodynamically stable.   Delay start of Pharmacological VTE agent (>24hrs) due to surgical blood loss or risk of bleeding:  yes

## 2013-09-05 NOTE — Anesthesia Procedure Notes (Signed)
Procedure Name: Intubation Date/Time: 09/05/2013 10:20 AM Performed by: Margaree Mackintosh Pre-anesthesia Checklist: Patient identified, Timeout performed, Emergency Drugs available, Suction available and Patient being monitored Patient Re-evaluated:Patient Re-evaluated prior to inductionOxygen Delivery Method: Circle system utilized Preoxygenation: Pre-oxygenation with 100% oxygen Intubation Type: IV induction and Rapid sequence Laryngoscope Size: Mac and 3 Grade View: Grade I Tube type: Oral Tube size: 7.0 mm Number of attempts: 1 Airway Equipment and Method: Stylet and LTA kit utilized Placement Confirmation: ETT inserted through vocal cords under direct vision,  positive ETCO2 and breath sounds checked- equal and bilateral Secured at: 23 cm Tube secured with: Tape Dental Injury: Teeth and Oropharynx as per pre-operative assessment

## 2013-09-05 NOTE — Transfer of Care (Signed)
Immediate Anesthesia Transfer of Care Note  Patient: Cynthia Kirk  Procedure(s) Performed: Procedure(s) with comments: Lumbar four-five Maximum Access Surgery  Posterior lumbar interbody fusion (N/A) - Lumbar four-five Maximum Access Surgery  Posterior lumbar interbody fusion Thoracic eleven-twelve Thoracic Laminectomy (N/A) - Thoracic eleven-twelve Thoracic Laminectomy  Patient Location: PACU  Anesthesia Type:General  Level of Consciousness: awake and alert   Airway & Oxygen Therapy: Patient Spontanous Breathing and Patient connected to face mask oxygen  Post-op Assessment: Report given to PACU RN, Post -op Vital signs reviewed and stable and Patient moving all extremities  Post vital signs: Reviewed and stable  Complications: No apparent anesthesia complications

## 2013-09-05 NOTE — Anesthesia Postprocedure Evaluation (Signed)
Anesthesia Post Note  Patient: Cynthia Kirk  Procedure(s) Performed: Procedure(s) (LRB): Lumbar four-five Maximum Access Surgery  Posterior lumbar interbody fusion (N/A) Thoracic eleven-twelve Thoracic Laminectomy (N/A)  Anesthesia type: General  Patient location: PACU  Post pain: Pain level controlled and Adequate analgesia  Post assessment: Post-op Vital signs reviewed, Patient's Cardiovascular Status Stable, Respiratory Function Stable, Patent Airway and Pain level controlled  Last Vitals:  Filed Vitals:   09/05/13 1530  BP:   Pulse: 68  Temp:   Resp: 14    Post vital signs: Reviewed and stable  Level of consciousness: awake, alert  and oriented  Complications: No apparent anesthesia complications

## 2013-09-05 NOTE — Progress Notes (Signed)
Dr. Nicholaus Corolla notified that patient states she is a difficult IV stick, that sometimes they have had to use her neck. He instructed me to take a look and if I don't see anything they will start it when she gets upstairs in neuro. I did see a vein in her left outside wrist but she states those veins always blow even though they may look good. I opted to let anesthesia start the IV when she gets in the neuro pre op area.

## 2013-09-05 NOTE — Preoperative (Signed)
Beta Blockers   Reason not to administer Beta Blockers:Not Applicable 

## 2013-09-05 NOTE — H&P (Signed)
Subjective: Patient is a 62 y.o. female admitted for thoracic lam T11-12 and PLIF L4-5. Onset of symptoms was several months ago, gradually worsening since that time.  The pain is rated severe, and is located at the across the lower back and radiates to legs. The pain is described as aching and occurs all day. The symptoms have been progressive. Symptoms are exacerbated by exercise. MRI or CT showed stenosis T11-12 and spondylolisthesis/ stenosis L4-5.   Past Medical History  Diagnosis Date  . Hiatal hernia   . Type II or unspecified type diabetes mellitus without mention of complication, not stated as uncontrolled   . Morbid obesity 2010  . Iron deficiency anemia, unspecified   . Esophageal reflux   . Allergic rhinitis, cause unspecified   . Unspecified asthma(493.90)   . Sarcoidosis 1996    @ [employer]  . Unspecified menopausal and postmenopausal disorder   . Intestinal infection due to Clostridium difficile   . Iron deficiency anemia   . Syncope     ?? Syncope ??  . Ejection fraction     EF 45-50%, echo, October 27, 2011  . H/O menorrhagia 2011  . Endometrial mass 2011  . Urinary incontinence, mixed 2011  . Simple endometrial hyperplasia 2011  . Herpes simplex type 2 infection complicating pregnancy   . ASCUS with positive high risk HPV   . PMB (postmenopausal bleeding) 05/13/2010  . H/O goiter   . VIN II (vulvar intraepithelial neoplasia II)   . Fibroid   . Migraine   . Pneumonia 2005,2007-2011  . Anxiety   . H/O Clostridium difficile infection 03/2010  . Adenomatous colon polyp   . Blood transfusion 1976    Due to ectopic pregnancy  . Abnormal Pap smear 2008  . H/O varicella   . History of measles, mumps, or rubella   . Pain, pelvic, female 2010  . TMJ (dislocation of temporomandibular joint)   . Hx of migraines   . Obstructive sleep apnea (adult) (pediatric)     cpap  . Complication of anesthesia     difficult iv access, usually has a picc  . Hypertension     dr  Felicity Coyer    Past Surgical History  Procedure Laterality Date  . Vulvectomy    . Vulvectomy for cancerous cell    . Tubal pregnancy      x's 2  . Uretha lift    . Hysteroscopy  2011  . Dilation and curettage of uterus  2007    Prior to Admission medications   Medication Sig Start Date End Date Taking? Authorizing Provider  ALPRAZolam Prudy Feeler) 1 MG tablet Take 1 tablet (1 mg total) by mouth 2 (two) times daily as needed for sleep or anxiety. 08/16/13  Yes Newt Lukes, MD  Cholecalciferol (VITAMIN D) 2000 UNITS tablet Take 1,000 Units by mouth daily.    Yes Historical Provider, MD  cyanocobalamin (,VITAMIN B-12,) 1000 MCG/ML injection Inject 1,000 mcg into the muscle every 30 (thirty) days.   Yes Historical Provider, MD  DEXILANT 60 MG capsule TAKE 1 CAPSULE (60 MG TOTAL) BY MOUTH DAILY. 02/02/13  Yes Meryl Dare, MD  HYDROmorphone (DILAUDID) 4 MG tablet Take 4 mg by mouth every 4 (four) hours as needed for pain. For back pain   Yes Historical Provider, MD  hydroquinone 4 % cream Apply topically 2 (two) times daily. 08/27/13  Yes Newt Lukes, MD  mometasone (ASMANEX 60 METERED DOSES) 220 MCG/INH inhaler Inhale 2 puffs into the lungs  at bedtime. 01/29/13  Yes Coralyn Helling, MD  montelukast (SINGULAIR) 10 MG tablet  07/06/13  Yes Coralyn Helling, MD  Multiple Vitamin (MULTIVITAMIN WITH MINERALS) TABS tablet Take 1 tablet by mouth daily.   Yes Historical Provider, MD  nebivolol (BYSTOLIC) 5 MG tablet TAKE 1 TABLET (5 MG TOTAL) BY MOUTH DAILY. 08/16/13  Yes Newt Lukes, MD  nystatin cream (MYCOSTATIN) Apply topically as needed for dry skin. 07/23/13  Yes Newt Lukes, MD  olmesartan-hydrochlorothiazide ([name] HCT) 40-25 MG per tablet TAKE 1 TABLET BY MOUTH DAILY. 08/16/13  Yes Newt Lukes, MD  polyethylene glycol powder (GLYCOLAX/MIRALAX) powder Take 17 g by mouth 2 (two) times daily. 01/29/13  Yes Meryl Dare, MD  promethazine (PHENERGAN) 12.5 MG tablet Take 12.5 mg by  mouth every 6 (six) hours as needed for nausea.   Yes Historical Provider, MD  ranitidine (ZANTAC) 300 MG tablet Take 1 tablet (300 mg total) by mouth at bedtime. 01/29/13  Yes Meryl Dare, MD  solifenacin (VESICARE) 10 MG tablet Take 10 mg by mouth daily.    Yes Historical Provider, MD  albuterol (PROVENTIL HFA;VENTOLIN HFA) 108 (90 BASE) MCG/ACT inhaler Inhale 2 puffs into the lungs 4 (four) times daily as needed for wheezing or shortness of breath. 01/29/13   Coralyn Helling, MD  albuterol (PROVENTIL) (2.5 MG/3ML) 0.083% nebulizer solution Take 2.5 mg by nebulization every 6 (six) hours as needed for shortness of breath. 01/29/13   Coralyn Helling, MD  ASMANEX 60 METERED DOSES 220 MCG/INH inhaler INHALE 2 PUFFS INTO THE LUNGS AT BEDTIME 06/20/13   Coralyn Helling, MD  HYDROcodone-acetaminophen (NORCO) 10-325 MG per tablet Take 1 tablet by mouth 3 (three) times daily as needed for pain.    Historical Provider, MD  medroxyPROGESTERone (PROVERA) 10 MG tablet Take 10 mg by mouth. Every 10 days 07/27/12 07/27/13  Purcell Nails, MD  SYRINGE/NEEDLE, DISP, 1 ML (B-D SYRINGE/NEEDLE 1CC/25GX5/8) 25G X 5/8" 1 ML MISC Use 1 monthly to administer her b12 injection 01/30/13   Newt Lukes, MD   Allergies  Allergen Reactions  . Aspirin     REACTION: hives  . Latex     REACTION: rash    History  Substance Use Topics  . Smoking status: Never Smoker   . Smokeless tobacco: Never Used  . Alcohol Use: Yes     Comment: occasionally    Family History  Problem Relation Age of Onset  . Heart attack Father   . Hypertension Father   . Diabetes Father   . Seizures Father   . Migraines Father   . Kidney disease Sister   . Osteoporosis Sister   . Heart attack Sister   . Asthma Daughter   . Asthma Son   . Osteoporosis Sister   . Lung cancer Maternal Uncle   . Cancer Paternal Uncle   . Kidney disease Other   . Diabetes Other   . High Cholesterol Brother     x2     Review of Systems  Positive ROS:  neg  All other systems have been reviewed and were otherwise negative with the exception of those mentioned in the HPI and as above.  Objective: Vital signs in last 24 hours: Temp:  [97.2 F (36.2 C)] 97.2 F (36.2 C) (10/01 0757) Pulse Rate:  [76] 76 (10/01 0757) Resp:  [20] 20 (10/01 0757) BP: (100)/(74) 100/74 mmHg (10/01 0757) SpO2:  [100 %] 100 % (10/01 0757)  General Appearance: Alert, cooperative, no  distress, appears stated age Head: Normocephalic, without obvious abnormality, atraumatic Eyes: PERRL, conjunctiva/corneas clear, EOM's intact    Neck: Supple, symmetrical, trachea midline Back: Symmetric, no curvature, ROM normal, no CVA tenderness Lungs:  respirations unlabored Heart: Regular rate and rhythm Abdomen: Soft, non-tender Extremities: Extremities normal, atraumatic, no cyanosis or edema Pulses: 2+ and symmetric all extremities Skin: Skin color, texture, turgor normal, no rashes or lesions  NEUROLOGIC:   Mental status: Alert and oriented x4,  no aphasia, good attention span, fund of knowledge, and memory Motor Exam - grossly normal Sensory Exam - grossly normal Reflexes: trace Coordination - grossly normal Gait - grossly normal Balance - grossly normal Cranial Nerves: I: smell Not tested  II: visual acuity  OS: nl    OD: nl  II: visual fields Full to confrontation  II: pupils Equal, round, reactive to light  III,VII: ptosis None  III,IV,VI: extraocular muscles  Full ROM  V: mastication Normal  V: facial light touch sensation  Normal  V,VII: corneal reflex  Present  VII: facial muscle function - upper  Normal  VII: facial muscle function - lower Normal  VIII: hearing Not tested  IX: soft palate elevation  Normal  IX,X: gag reflex Present  XI: trapezius strength  5/5  XI: sternocleidomastoid strength 5/5  XI: neck flexion strength  5/5  XII: tongue strength  Normal    Data Review Lab Results  Component Value Date   WBC 4.7 08/29/2013   HGB  11.4* 08/29/2013   HCT 34.9* 08/29/2013   MCV 88.8 08/29/2013   PLT 215 08/29/2013   Lab Results  Component Value Date   NA 135 08/29/2013   K 3.1* 08/29/2013   CL 99 08/29/2013   CO2 28 08/29/2013   BUN 25* 08/29/2013   CREATININE 1.11* 08/29/2013   GLUCOSE 77 08/29/2013   No results found for this basename: INR, PROTIME    Assessment/Plan: Patient admitted for PLIF L4-5, thoracic laminectomy T11-12. Patient has failed a reasonable attempt at conservative therapy.  I explained the condition and procedure to the patient and answered any questions.  Patient wishes to proceed with procedure as planned. Understands risks/ benefits and typical outcomes of procedure.   Cynthia Kirk S 09/05/2013 8:57 AM

## 2013-09-05 NOTE — Progress Notes (Signed)
Pt. coing of lots of pain , BP still low, Dr. Chaney Malling aware

## 2013-09-05 NOTE — Progress Notes (Signed)
Patient ID: Cynthia Kirk, female   DOB: [birth year], 62 y.o.   MRN: [id number] Doing fine post-op. Sore but no leg pain or N/T/W.

## 2013-09-05 NOTE — Progress Notes (Signed)
Pt. With BP low, Dr. Chaney Malling aware , orders noted

## 2013-09-05 NOTE — Anesthesia Preprocedure Evaluation (Signed)
Anesthesia Evaluation  Patient identified by MRN, date of birth, ID band Patient awake    Reviewed: Allergy & Precautions, H&P , NPO status , Patient's Chart, lab work & pertinent test results  Airway Mallampati: II  Neck ROM: full    Dental   Pulmonary asthma , sleep apnea ,          Cardiovascular hypertension,     Neuro/Psych  Headaches, Anxiety  Neuromuscular disease    GI/Hepatic hiatal hernia, GERD-  ,  Endo/Other  diabetes, Type obesity  Renal/GU      Musculoskeletal   Abdominal   Peds  Hematology   Anesthesia Other Findings   Reproductive/Obstetrics                           Anesthesia Physical Anesthesia Plan  ASA: III  Anesthesia Plan: General   Post-op Pain Management:    Induction: Intravenous  Airway Management Planned: Oral ETT  Additional Equipment:   Intra-op Plan:   Post-operative Plan: Extubation in OR  Informed Consent: I have reviewed the patients History and Physical, chart, labs and discussed the procedure including the risks, benefits and alternatives for the proposed anesthesia with the patient or authorized representative who has indicated his/her understanding and acceptance.     Plan Discussed with: CRNA, Anesthesiologist and Surgeon  Anesthesia Plan Comments:         Anesthesia Quick Evaluation

## 2013-09-06 MED ORDER — SODIUM CHLORIDE 0.9 % IJ SOLN
10.0000 mL | INTRAMUSCULAR | Status: DC | PRN
  Administered 2013-09-07 – 2013-09-09 (×6): 10 mL
  Administered 2013-09-09 – 2013-09-10 (×2): 20 mL

## 2013-09-06 MED ORDER — ONDANSETRON HCL 4 MG/2ML IJ SOLN: 4.0000 mg | Freq: Four times a day (QID) | INTRAMUSCULAR | Status: DC | PRN

## 2013-09-06 MED ORDER — DIPHENHYDRAMINE HCL 12.5 MG/5ML PO ELIX: 12.5000 mg | ORAL_SOLUTION | Freq: Four times a day (QID) | ORAL | Status: DC | PRN

## 2013-09-06 MED ORDER — SODIUM CHLORIDE 0.9 % IJ SOLN: 9.0000 mL | INTRAMUSCULAR | Status: DC | PRN

## 2013-09-06 MED ORDER — DEXAMETHASONE SODIUM PHOSPHATE 10 MG/ML IJ SOLN
10.0000 mg | Freq: Once | INTRAMUSCULAR | Status: AC
  Administered 2013-09-06: 10 mg via INTRAVENOUS
  Filled 2013-09-06: qty 1

## 2013-09-06 MED ORDER — CELECOXIB 200 MG PO CAPS: 200.0000 mg | ORAL_CAPSULE | Freq: Two times a day (BID) | ORAL | Status: DC

## 2013-09-06 MED ORDER — HYDROMORPHONE HCL 2 MG PO TABS: 4.0000 mg | ORAL_TABLET | ORAL | Status: DC | PRN

## 2013-09-06 MED ORDER — KETOROLAC TROMETHAMINE 30 MG/ML IJ SOLN
30.0000 mg | Freq: Four times a day (QID) | INTRAMUSCULAR | Status: DC
  Administered 2013-09-06 – 2013-09-07 (×4): 30 mg via INTRAVENOUS
  Filled 2013-09-06 (×4): qty 1

## 2013-09-06 MED ORDER — NALOXONE HCL 0.4 MG/ML IJ SOLN: 0.4000 mg | INTRAMUSCULAR | Status: DC | PRN

## 2013-09-06 MED ORDER — DIPHENHYDRAMINE HCL 50 MG/ML IJ SOLN: 12.5000 mg | Freq: Four times a day (QID) | INTRAMUSCULAR | Status: DC | PRN

## 2013-09-06 MED ORDER — HYDROMORPHONE 0.3 MG/ML IV SOLN
INTRAVENOUS | Status: DC
  Administered 2013-09-06: 1.39 mg via INTRAVENOUS
  Administered 2013-09-06: 10:00:00 via INTRAVENOUS
  Administered 2013-09-06: 1.39 mg via INTRAVENOUS
  Administered 2013-09-07: 1.99 mg via INTRAVENOUS
  Administered 2013-09-07: 0.999 mL via INTRAVENOUS
  Administered 2013-09-07: 1.39 mg via INTRAVENOUS
  Administered 2013-09-07: 1.19 mg via INTRAVENOUS
  Administered 2013-09-07: 04:00:00 via INTRAVENOUS
  Administered 2013-09-08: 0.999 mg via INTRAVENOUS
  Administered 2013-09-08: 1.19 mg via INTRAVENOUS
  Administered 2013-09-08: 0.3 mg via INTRAVENOUS
  Administered 2013-09-08: 2.4 mg via INTRAVENOUS
  Administered 2013-09-08: 0.999 mg via INTRAVENOUS
  Administered 2013-09-09: 0.2 mg via INTRAVENOUS
  Filled 2013-09-06 (×3): qty 25

## 2013-09-06 NOTE — Progress Notes (Signed)
Around 0630, RN was able to sit pt on side of bed with brace on. Pt unable to sustain position because of severe pain. Pt placed back in bed. Pt states that IV Morphine ineffective. Will continue to monitor pain and follow up with MD. Salvadore Oxford, RN [phone] 09/06/13

## 2013-09-06 NOTE — Progress Notes (Signed)
Patient ID: Cynthia Kirk, female   DOB: 1951/06/10, 62 y.o.   MRN: [id number] Subjective: Patient reports appropriate low back soreness, but she's having LLE pain, esp when she sat up. Denies N/T or foacl weakness.  Objective: Vital signs in last 24 hours: Temp:  [97 F (36.1 C)-97.7 F (36.5 C)] 97.6 F (36.4 C) (10/02 4098) Pulse Rate:  [58-86] 86 (10/02 0632) Resp:  [10-20] 18 (10/02 0632) BP: (86-114)/(45-77) 114/70 mmHg (10/02 0632) SpO2:  [94 %-100 %] 100 % (10/02 1191) Weight:  [112.492 kg (248 lb)] 112.492 kg (248 lb) (10/02 0500)  Intake/Output from previous day: 10/01 0701 - 10/02 0700 In: 2200 [I.V.:2200] Out: 1360 [Urine:850; Drains:210; Blood:300] Intake/Output this shift:    Neurologic: Grossly normal to in-bed exam.  Lab Results: Lab Results  Component Value Date   WBC 4.7 08/29/2013   HGB 11.4* 08/29/2013   HCT 34.9* 08/29/2013   MCV 88.8 08/29/2013   PLT 215 08/29/2013   No results found for this basename: INR, PROTIME   BMET Lab Results  Component Value Date   NA 135 08/29/2013   K 3.1* 08/29/2013   CL 99 08/29/2013   CO2 28 08/29/2013   GLUCOSE 77 08/29/2013   BUN 25* 08/29/2013   CREATININE 1.11* 08/29/2013   CALCIUM 9.3 08/29/2013    Studies/Results: Dg Thoracolumabar Spine  09/05/2013   *RADIOLOGY REPORT*  Clinical Data: PLIF L4 - L5, T11 - T12 laminectomy  DG C-ARM 61-120 MIN,THORACOLUMBAR SPINE - 2 VIEW  Comparison:  Lumbar spine MRI - 03/21/2013 (outside examination); CT abdomen pelvis - 03/21/2023  Findings:  Four spot intraoperative radiographic images of the thoracic and lumbar spine are provided for review. Spinal labeling is in keeping with prior abdominal CT at which time it was noted there were 5 non- rib bearing lumbar type vertebral bodies with normal appearing ribs seen bilaterally at T12.  Spinal labeling of the two initial provided images (labeled #2 and 3) of the thoracolumbar junction are degraded secondary to exclusion of the lumbosacral  sacral junction.  There is a marking instruments seen posterior to the approximate T12 vertebral body. Additional radiopaque support apparatus is seen posterior to the operative site.  Images labeled #6 and 7 demonstrate the sequela of L4 - L5 paraspinal fusion and intervertebral disc space replacement.  There is restoration of the L4 - L5 intervertebral disc space.  Alignment appears near anatomic. No definite radiopaque foreign body.  IMPRESSION: 1.  Post L4 - L5 PLIF. 2.  Intraoperative localization of the T12 vertebral body.   Original Report Authenticated By: Tacey Ruiz, MD   Dg Chest Port 1 View  09/05/2013   CLINICAL DATA:  Central line placement  EXAM: PORTABLE CHEST - 1 VIEW  COMPARISON:  Portable exam 1507 hr compared to 08/29/2013  FINDINGS: Right jugular central venous catheter with tip projecting over the proximal SVC.  Upper normal heart size.  Stable mediastinal contours.  Chronic accentuation of markings is seen in the upper lungs bilaterally, question slightly increased in right upper lobe versus differences in technique.  No pleural effusion or pneumothorax.  Bones unremarkable.  IMPRESSION: No pneumothorax following right jugular line placement.  Bilateral upper lobe opacities question slightly increased in right upper lobe.   Electronically Signed   By: Ulyses Southward M.D.   On: 09/05/2013 15:28   Dg C-arm 61-120 Min  09/05/2013   *RADIOLOGY REPORT*  Clinical Data: PLIF L4 - L5, T11 - T12 laminectomy  DG C-ARM 61-120 MIN,THORACOLUMBAR SPINE -  2 VIEW  Comparison:  Lumbar spine MRI - 03/21/2013 (outside examination); CT abdomen pelvis - 03/21/2023  Findings:  Four spot intraoperative radiographic images of the thoracic and lumbar spine are provided for review. Spinal labeling is in keeping with prior abdominal CT at which time it was noted there were 5 non- rib bearing lumbar type vertebral bodies with normal appearing ribs seen bilaterally at T12.  Spinal labeling of the two initial provided  images (labeled #2 and 3) of the thoracolumbar junction are degraded secondary to exclusion of the lumbosacral sacral junction.  There is a marking instruments seen posterior to the approximate T12 vertebral body. Additional radiopaque support apparatus is seen posterior to the operative site.  Images labeled #6 and 7 demonstrate the sequela of L4 - L5 paraspinal fusion and intervertebral disc space replacement.  There is restoration of the L4 - L5 intervertebral disc space.  Alignment appears near anatomic. No definite radiopaque foreign body.  IMPRESSION: 1.  Post L4 - L5 PLIF. 2.  Intraoperative localization of the T12 vertebral body.   Original Report Authenticated By: Tacey Ruiz, MD    Assessment/Plan: Post-op LLE pain. Likely radiculitis. Doubt hematoma at present. Decadron and PCA and re-check later today.   LOS: 1 day    Meryle Pugmire S 09/06/2013, 7:34 AM

## 2013-09-06 NOTE — Progress Notes (Signed)
OT Cancellation Note  Patient Details Name: Cynthia Kirk MRN: [id number] DOB: November 15, 1951   Cancelled Treatment:    Reason Eval/Treat Not Completed: Patient declined, no reason specified (leg pain) OT reattempting eval this PM. Pt declining evaluation at this time reporting pain "toothache like pain" in left LT. Pt declines OOB.  [name] Pager: [phone]  09/06/2013, 3:19 PM

## 2013-09-06 NOTE — Progress Notes (Signed)
OT NOTE  OT spoke with patient, as she was ending PT session, and currently to fatigued for OT evaluation. Pt with lack of sleep and pain management concerns during the PM hours 09/05/13. OT and patient agree to re-attempt this PM.    Mateo Flow   OTR/L Pager: [phone] Office: [phone] .

## 2013-09-06 NOTE — Evaluation (Signed)
Physical Therapy Evaluation Patient Details Name: Cynthia Kirk MRN: [id number] DOB: 1951/12/06 Today's Date: 09/06/2013 Time: 7829-5621 PT Time Calculation (min): 35 min  PT Assessment / Plan / Recommendation History of Present Illness  Admitted for: decompressive thoracic laminectomy, medial facetectomy T11-12 for thoracic spinal stenosis, Posterior fixation L4-5 using cortical pedicle screws.  Clinical Impression  Patient is s/p thoracic/lumbar surgery resulting in the deficits listed below (see PT Problem List). Patient will benefit from skilled PT to increase their independence and safety with mobility (while adhering to their precautions) to allow discharge to home. Hopefully mobility will be much better when pain better controlled.  Pt will be alone for about 11 hours per day as husband works.       PT Assessment  Patient needs continued PT services    Follow Up Recommendations  Home health PT (If pain better controlled anticipate pt will mobilize easier)    Does the patient have the potential to tolerate intense rehabilitation      Barriers to Discharge Decreased caregiver support      Equipment Recommendations  Rolling walker with 5" wheels    Recommendations for Other Services OT consult   Frequency Min 5X/week    Precautions / Restrictions Precautions Precautions: Back Precaution Booklet Issued: Yes (comment) Required Braces or Orthoses: Spinal Brace Spinal Brace: Lumbar corset;Applied in sitting position Restrictions Weight Bearing Restrictions: No   Pertinent Vitals/Pain 10/10 back and left leg pain.  RN aware. Pt just switched to PCA prior to PT arrival.        Mobility  Bed Mobility Bed Mobility: Rolling Left;Left Sidelying to Sit Rolling Left: 4: Min guard;With rail Left Sidelying to Sit: 4: Min assist;HOB elevated;With rails Details for Bed Mobility Assistance: instructed in log rolling Transfers Transfers: Sit to Stand;Stand to Sit Sit to Stand: 4:  Min assist;With upper extremity assist;From bed (increased time ) Stand to Sit: 4: Min assist;With upper extremity assist;To bed (increased time) Details for Transfer Assistance: Pt very painful with transfer Ambulation/Gait Ambulation/Gait Assistance: Not tested (comment) (pt unable due to pain and "swimmy headed") General Gait Details: Pt stood with RW for about 1 minute with min A    Exercises General Exercises - Lower Extremity Ankle Circles/Pumps: Both;5 reps;AROM;Supine   PT Diagnosis: Difficulty walking  PT Problem List: Decreased strength;Decreased activity tolerance;Decreased mobility;Decreased knowledge of use of DME;Decreased knowledge of precautions;Pain;Cardiopulmonary status limiting activity PT Treatment Interventions: DME instruction;Gait training;Therapeutic activities;Patient/family education     PT Goals(Current goals can be found in the care plan section) Acute Rehab PT Goals Patient Stated Goal: to be able to go home and care for herself PT Goal Formulation: With patient Time For Goal Achievement: 09/13/13 Potential to Achieve Goals: Good  Visit Information  Last PT Received On: 09/06/13 Assistance Needed: +1 History of Present Illness: Admitted for: decompressive thoracic laminectomy, medial facetectomy T11-12 for thoracic spinal stenosis, Posterior fixation L4-5 using cortical pedicle screws.       Prior Functioning  Home Living Family/patient expects to be discharged to:: Private residence Living Arrangements: Spouse/significant other Available Help at Discharge: Family (Husband works 6AM-5PM) Type of Home: Other(Comment) (townhouse) Home Access: Level entry Home Layout: Two level (bedroom upstairs, has chair to ride up the stairs on) Alternate Level Stairs-Number of Steps: flight Home Equipment: None Prior Function Level of Independence: Independent Communication Communication: No difficulties    Cognition  Cognition Arousal/Alertness:  Awake/alert Behavior During Therapy: WFL for tasks assessed/performed Overall Cognitive Status: Within Functional Limits for tasks assessed  Extremity/Trunk Assessment Upper Extremity Assessment Upper Extremity Assessment: Defer to OT evaluation Lower Extremity Assessment Lower Extremity Assessment: Generalized weakness (Likely due to pain)   Balance    End of Session PT - End of Session Equipment Utilized During Treatment: Gait belt;Back brace;Oxygen Activity Tolerance: Patient limited by pain;Treatment limited secondary to medical complications (Comment) ("swimmy headed" when standing) Patient left: in bed;with call bell/phone within reach Nurse Communication: Mobility status  GP     Donnella Sham 09/06/2013, 11:30 AM Lavona Mound, PT  [phone] 09/06/2013

## 2013-09-06 NOTE — Progress Notes (Signed)
UR COMPLETED  

## 2013-09-07 ENCOUNTER — Encounter (HOSPITAL_COMMUNITY): Payer: Self-pay | Admitting: Neurological Surgery

## 2013-09-07 MED ORDER — CELECOXIB 200 MG PO CAPS: 200.0000 mg | ORAL_CAPSULE | Freq: Two times a day (BID) | ORAL | Status: DC

## 2013-09-07 NOTE — Progress Notes (Signed)
Bladder scan revealed . In and out cath performed and obtained . HS nurse made aware and advised to monitor through the night.

## 2013-09-07 NOTE — Progress Notes (Signed)
PT Cancellation Note  Patient Details Name: Cynthia Kirk MRN: [id number] DOB: [birth year]   Cancelled Treatment:    Reason Eval/Treat Not Completed: Pain limiting ability to participate;Fatigue/lethargy limiting ability to participate;Patient declined, no reason specified.  Patient reports she has just returned to bed and does not want to work with PT today.  Asked that we return in am.   Vena Austria 09/07/2013, 3:34 PM Durenda Hurt. Renaldo Fiddler, [facility] Acute Rehab Services Pager [phone]

## 2013-09-07 NOTE — Evaluation (Signed)
Occupational Therapy Evaluation Patient Details Name: Cynthia Kirk MRN: [id number] DOB: 1951/08/15 Today's Date: 09/07/2013 Time: 4098-1191 OT Time Calculation (min): 29 min  OT Assessment / Plan / Recommendation History of present illness Admitted for: decompressive thoracic laminectomy, medial facetectomy T11-12 for thoracic spinal stenosis, Posterior fixation L4-5 using cortical pedicle screws.   Clinical Impression   Patient is s/p L4-5 and T11-12 surgery resulting in functional limitations due to the deficits listed below (see OT problem list).  Patient will benefit from skilled OT acutely to increase independence and safety with ADLS to allow discharge HHOT with 3n1.Next session to focus on toilet with hygiene and shower transfer     OT Assessment  Patient needs continued OT Services    Follow Up Recommendations  Home health OT;Supervision - Intermittent    Barriers to Discharge      Equipment Recommendations  3 in 1 bedside comode;Other (comment) (nurse aide, AE - reacher)    Recommendations for Other Services    Frequency  Min 2X/week    Precautions / Restrictions Precautions Precautions: Back Precaution Comments: handout present and reviewed with patient. Pt recalling 2 out 3 precautions Required Braces or Orthoses: Spinal Brace Spinal Brace: Lumbar corset;Applied in sitting position   Pertinent Vitals/Pain Minimal pain Has PCA not pushing during session    ADL  Eating/Feeding: Modified independent Where Assessed - Eating/Feeding: Chair Grooming: Wash/dry hands;Wash/dry face;Modified independent Where Assessed - Grooming: Supported sitting Lower Body Dressing: Moderate assistance Where Assessed - Lower Body Dressing: Supported sit to Pharmacist, hospital: Moderate assistance Toilet Transfer Method: Sit to stand Toilet Transfer Equipment: Raised toilet seat with arms (or 3-in-1 over toilet) Equipment Used: Back brace;Rolling walker;Gait  belt Transfers/Ambulation Related to ADLs: Pt with left LE with decr step length compared to right side and lagging with progressing with RW ADL Comments: Pt supine on arrival attempting to locate black berry cell phone. Pt agreeable to OOB to have new linens placed on bed. Pt with decr LT LE pain at this time compared to 09/06/13. Pt min v/c for bed mobility. Pt completed transfer from EOB to chair. pt is not able to cross Lt LE. pt will need AE education for LT LE dressing. Pt has spouse that works throughout the day. Pt asking for no visitors and XXX status. RN kim arriving to room and notified that pt has estabilished password and told husband on the phone "[phone]" Pt educated on ADLS with back precautions    OT Diagnosis: Generalized weakness;Acute pain  OT Problem List: Decreased strength;Decreased activity tolerance;Impaired balance (sitting and/or standing);Decreased safety awareness;Decreased knowledge of use of DME or AE;Decreased knowledge of precautions;Pain OT Treatment Interventions: Self-care/ADL training;Therapeutic exercise;DME and/or AE instruction;Therapeutic activities;Patient/family education;Balance training   OT Goals(Current goals can be found in the care plan section) Acute Rehab OT Goals Patient Stated Goal: to be able to go home and care for herself OT Goal Formulation: With patient Time For Goal Achievement: 09/21/13 Potential to Achieve Goals: Good  Visit Information  Last OT Received On: 09/07/13 Assistance Needed: +1 History of Present Illness: Admitted for: decompressive thoracic laminectomy, medial facetectomy T11-12 for thoracic spinal stenosis, Posterior fixation L4-5 using cortical pedicle screws.       Prior Functioning     Home Living Family/patient expects to be discharged to:: Private residence Living Arrangements: Spouse/significant other Available Help at Discharge: Family Type of Home: Other(Comment) Home Access: Level entry Home Layout: Two  level Alternate Level Stairs-Number of Steps: flight Home Equipment: Shower seat Prior Function  Level of Independence: Independent Communication Communication: No difficulties Dominant Hand: Left         Vision/Perception Vision - History Baseline Vision: Wears glasses all the time Patient Visual Report: No change from baseline Vision - Assessment Vision Assessment: Vision not tested   Cognition  Cognition Arousal/Alertness: Awake/alert Behavior During Therapy: WFL for tasks assessed/performed Overall Cognitive Status: Within Functional Limits for tasks assessed    Extremity/Trunk Assessment Upper Extremity Assessment Upper Extremity Assessment: Overall WFL for tasks assessed Lower Extremity Assessment Lower Extremity Assessment: Defer to PT evaluation (weakness in LT LE)     Mobility Bed Mobility Bed Mobility: Rolling Right;Right Sidelying to Sit;Supine to Sit;Sitting - Scoot to Edge of Bed Rolling Right: 4: Min guard;With rail Right Sidelying to Sit: 4: Min assist;With rails;HOB elevated Supine to Sit: 4: Min assist;With rails;HOB elevated Sitting - Scoot to Edge of Bed: 4: Min guard;With rail Details for Bed Mobility Assistance: educated on sequence with back precautions and with good return demo Transfers Transfers: Stand to Sit;Sit to Stand Sit to Stand: 4: Min assist;With upper extremity assist;From bed Stand to Sit: 4: Min assist;With upper extremity assist;To chair/3-in-1 Details for Transfer Assistance: pt with left le lagging behind with transfer but able to weight bear and progress this session     Exercise     Balance     End of Session OT - End of Session Activity Tolerance: Patient tolerated treatment well Patient left: in chair;with call bell/phone within reach Nurse Communication: Mobility status;Precautions  GO     [name] 09/07/2013, 11:05 AM Pager: [phone]

## 2013-09-07 NOTE — Care Management Note (Signed)
    Page 1 of 2   09/10/2013     11:27:19 AM   CARE MANAGEMENT NOTE 09/10/2013  Patient:  [facility]   Account Number:  [id number]  Date Initiated:  09/07/2013  Documentation initiated by:  Elmer Bales  Subjective/Objective Assessment:   Patient was admitted for PLIF.     Action/Plan:   Will follow for discharge needs.   Anticipated DC Date:     Anticipated DC Plan:  HOME W HOME HEALTH SERVICES      DC Planning Services  CM consult      Choice offered to / List presented to:  C-1 Patient   DME arranged  3-N-1  Levan Hurst      DME agency  [facility]     HH arranged  HH-3 OT  HH-2 PT  HH-4 NURSE'S AIDE      HH agency  [facility]   Status of service:  Completed, signed off Medicare Important Message given?   (If response is "NO", the following Medicare IM given date fields will be blank) Date Medicare IM given:   Date Additional Medicare IM given:    Discharge Disposition:  HOME W HOME HEALTH SERVICES  Per UR Regulation:    If discussed at Long Length of Stay Meetings, dates discussed:    Comments:  09/10/13 1115 Elmer Bales RN, MSN, CM- Spoke with [facility] DME to notifiy that patient will need equipment delivered for probable discharge today.  09/07/13 1100 Courtney Robarge RN, MSN, CM- Met with patient to discuss home health needs.  Patient has chosen [facility] for PT/OT/NA, which she has used in the past. Corrie Dandy with [facility] was notified and accepted referral.

## 2013-09-07 NOTE — Progress Notes (Signed)
Patient ID: Cynthia Kirk, female   DOB: 16-Aug-1951, 62 y.o.   MRN: [id number] Much better today. Less leg pain. Doing better with therapy. Legs moving well.

## 2013-09-07 NOTE — Progress Notes (Signed)
Patient got irritated and upset of all the beeping caused by PCA machine, explained to pt that machine will beep if her levels are lower or higher than normal range. Gave Insentive Spirometer and instructed pt how to use and explained the advantage of IS but pt was uncooperative, pt stated that she is on CPAP at home. Explained to pt that she can not wear the CPAP if she is on PCA per RT. Will continue to monitor.

## 2013-09-08 LAB — GLUCOSE, CAPILLARY
Glucose-Capillary: 118 mg/dL — ABNORMAL HIGH (ref 70–99)
Glucose-Capillary: 94 mg/dL (ref 70–99)

## 2013-09-08 MED ORDER — HYDROCOD POLST-CHLORPHEN POLST 10-8 MG/5ML PO LQCR: 5.0000 mL | Freq: Four times a day (QID) | ORAL | Status: DC | PRN

## 2013-09-08 MED ORDER — HYDROCOD POLST-CHLORPHEN POLST 10-8 MG/5ML PO LQCR
5.0000 mL | Freq: Two times a day (BID) | ORAL | Status: DC | PRN
  Administered 2013-09-09 (×3): 5 mL via ORAL
  Filled 2013-09-08 (×3): qty 5

## 2013-09-08 NOTE — Progress Notes (Signed)
Physical Therapy Treatment Patient Details Name: Cynthia Kirk MRN: [id number] DOB: 06-29-1951 Today's Date: 09/08/2013 Time: 8657-8469 PT Time Calculation (min): 24 min  PT Assessment / Plan / Recommendation  History of Present Illness Admitted for: decompressive thoracic laminectomy, medial facetectomy T11-12 for thoracic spinal stenosis, Posterior fixation L4-5 using cortical pedicle screws.   PT Comments   Pt making very slow progress towards goals.  Pt need max motivation to ambulate and needed increase (A) to get into bed using step stool .   Pt requesting for therapy in PM.  Pt will need to ambulate further and need to continue to practice bed mobility reach mod (I) prior to d/c home.   Follow Up Recommendations  Home health PT     Equipment Recommendations  Rolling walker with 5" wheels    Frequency Min 5X/week   Progress towards PT Goals Progress towards PT goals: Progressing toward goals (very slowly)  Plan Current plan remains appropriate    Precautions / Restrictions Precautions Precautions: Back Precaution Comments: Pt continues to twist during functional activites after several VCs for no twisitng.  Required Braces or Orthoses: Spinal Brace Spinal Brace: Lumbar corset;Applied in sitting position   Pertinent Vitals/Pain C/o back pain 5/10     Mobility  Bed Mobility Bed Mobility: Not assessed Left Sidelying to Sit: 3: Mod assist Details for Bed Mobility Assistance: Increase (A) needed when going into bed on left side and using step stool to enter bed.  Pt continues to twist when trying to lay on left side.  Needed (A) to elevate LE into bed.  Transfers Transfers: Sit to Stand;Stand to Sit Sit to Stand: 4: Min guard;With upper extremity assist;From toilet Stand to Sit: 4: Min guard;With upper extremity assist;To toilet Details for Transfer Assistance: VCs for hand placement Ambulation/Gait Ambulation/Gait Assistance: 4: Min guard Ambulation Distance (Feet): 20  Feet Assistive device: Rolling walker Ambulation/Gait Assistance Details: Minguard for safety with cues for RW placement and body postion within RW.  Pt tends to ambulate too far from RW.  Pt may need bariatric RW next session . Gait Pattern: Step-through pattern;Decreased stride length;Shuffle;Trunk flexed;Decreased trunk rotation Gait velocity: decreased Stairs: Yes Stairs Assistance: 4: Min assist Stairs Assistance Details (indicate cue type and reason): (A) to manage RW.  Pt practice step to enter bed.  Pt uses step stool to get in/out of bed.   Stair Management Technique: Backwards;With walker Number of Stairs: 1    Exercises     PT Diagnosis:    PT Problem List:   PT Treatment Interventions:     PT Goals (current goals can now be found in the care plan section) Acute Rehab PT Goals Patient Stated Goal: to be able to go home and care for herself PT Goal Formulation: With patient Time For Goal Achievement: 09/13/13 Potential to Achieve Goals: Good  Visit Information  Last PT Received On: 09/08/13 Assistance Needed: +1 History of Present Illness: Admitted for: decompressive thoracic laminectomy, medial facetectomy T11-12 for thoracic spinal stenosis, Posterior fixation L4-5 using cortical pedicle screws.    Subjective Data  Subjective: "Is there a way you all can come at a different time?"  (pt was trying to finish up morning routine with therapist entered) Patient Stated Goal: to be able to go home and care for herself   Cognition  Cognition Arousal/Alertness: Awake/alert Behavior During Therapy: WFL for tasks assessed/performed Overall Cognitive Status: Within Functional Limits for tasks assessed    Balance     End of Session PT -  End of Session Equipment Utilized During Treatment: Gait belt;Back brace Activity Tolerance: Patient limited by pain;Patient limited by fatigue Patient left: in bed;with call bell/phone within reach Nurse Communication: Mobility status    GP     Salli Bodin 09/08/2013, 11:09 AM  Jake Shark, PT DPT [phone]

## 2013-09-08 NOTE — Progress Notes (Signed)
Pt hemovac came out while up ambulating to the bathroom.  MD has been made aware.  Okay to keep drain out at this time.  Dressing to pt back has been changed and is currently clean, dry, and intact. Nino Glow RN

## 2013-09-08 NOTE — Progress Notes (Signed)
No new issues or problems. Back pain better controlled. No lower extremity symptoms.  She is afebrile. Vitals are stable. Motor and sensory examination stable. Abdomen soft.  Progressing slowly following lumbar decompression and fusion surgery. Continue efforts at mobilization. Possible discharge as early as tomorrow if patient makes progress.

## 2013-09-08 NOTE — Progress Notes (Signed)
Occupational Therapy Treatment Patient Details Name: Cynthia Kirk MRN: [id number] DOB: 09/07/1951 Today's Date: 09/08/2013 Time: 4098-1191 OT Time Calculation (min): 18 min  OT Assessment / Plan / Recommendation  History of present illness Admitted for: decompressive thoracic laminectomy, medial facetectomy T11-12 for thoracic spinal stenosis, Posterior fixation L4-5 using cortical pedicle screws.   OT comments  Pt progressing toward goals but needs cues during ADLS for back precautions. Pt's body habitus makes reaching difficult and needs cues to avoid twisting. Pt fatigued s/p bath due to extended time needed to perform task. Pt at risk for UTI due to peri care and pt's body habitus . Pt educated and very detailed in hygiene this session   Follow Up Recommendations  Home health OT;Supervision - Intermittent    Barriers to Discharge       Equipment Recommendations  3 in 1 bedside comode;Other (comment) (home aide)    Recommendations for Other Services    Frequency Min 2X/week   Progress towards OT Goals Progress towards OT goals: Progressing toward goals  Plan Discharge plan remains appropriate    Precautions / Restrictions Precautions Precautions: Back Required Braces or Orthoses: Spinal Brace Spinal Brace: Lumbar corset;Applied in sitting position   Pertinent Vitals/Pain Fatigued No pain reported    ADL  Grooming: Wash/dry hands;Wash/dry face;Supervision/safety Where Assessed - Grooming: Unsupported standing Upper Body Bathing: Chest;Right arm;Left arm;Abdomen;Supervision/safety Where Assessed - Upper Body Bathing: Unsupported standing Lower Body Bathing: Min guard Where Assessed - Lower Body Bathing: Unsupported sit to stand (verbal cues to avoid twisting) Upper Body Dressing: Modified independent Where Assessed - Upper Body Dressing: Unsupported sitting Toilet Transfer: Min guard Toilet Transfer Method: Sit to Barista: Regular height  toilet;Grab bars Toileting - Architect and Hygiene: Min guard Where Assessed - Engineer, mining and Hygiene: Sit to stand from 3-in-1 or toilet Equipment Used: Back brace;Rolling walker;Gait belt ADL Comments: pt in bathroom on arrival performing bath. pt needed cues throughout bathing to avoid twisting. Pt requires extended time and alternating BIL UE to perform peri care. Pt fatigued s/p bath due to extended time required. Pt bending at sink to rinse out wash cloth. Pt educated that upright postures is best and pt responding to education with incr static standing posture. Pt able to cross bil LE for LB bathing this session    OT Diagnosis:    OT Problem List:   OT Treatment Interventions:     OT Goals(current goals can now be found in the care plan section) Acute Rehab OT Goals Patient Stated Goal: to be able to go home and care for herself OT Goal Formulation: With patient Time For Goal Achievement: 09/21/13 Potential to Achieve Goals: Good ADL Goals Pt Will Perform Lower Body Dressing: with modified independence;with adaptive equipment;sit to/from stand Pt Will Transfer to Toilet: with modified independence;bedside commode Pt Will Perform Toileting - Clothing Manipulation and hygiene: with modified independence;sit to/from stand;with adaptive equipment Pt Will Perform Tub/Shower Transfer: shower seat;Tub transfer;with min guard assist;rolling walker  Visit Information  Last OT Received On: 09/08/13 Assistance Needed: +1 History of Present Illness: Admitted for: decompressive thoracic laminectomy, medial facetectomy T11-12 for thoracic spinal stenosis, Posterior fixation L4-5 using cortical pedicle screws.    Subjective Data      Prior Functioning       Cognition  Cognition Arousal/Alertness: Awake/alert Behavior During Therapy: WFL for tasks assessed/performed Overall Cognitive Status: Within Functional Limits for tasks assessed    Mobility  Bed  Mobility Bed Mobility: Not assessed  Transfers Transfers: Sit to Stand;Stand to Sit Sit to Stand: 4: Min guard;With upper extremity assist;From toilet Stand to Sit: 4: Min guard;With upper extremity assist;To toilet Details for Transfer Assistance: pt requires grab bar to complete standard commode transfer    Exercises      Balance     End of Session OT - End of Session Activity Tolerance: Patient tolerated treatment well Patient left: in chair;with call bell/phone within reach Nurse Communication: Mobility status;Precautions  GO     [name] 09/08/2013, 10:58 AM Pager: [phone]

## 2013-09-09 ENCOUNTER — Other Ambulatory Visit: Payer: Self-pay | Admitting: Pulmonary Disease

## 2013-09-09 LAB — GLUCOSE, CAPILLARY
Glucose-Capillary: 113 mg/dL — ABNORMAL HIGH (ref 70–99)
Glucose-Capillary: 138 mg/dL — ABNORMAL HIGH (ref 70–99)

## 2013-09-09 MED ORDER — HYDROMORPHONE HCL 2 MG PO TABS
2.0000 mg | ORAL_TABLET | ORAL | Status: DC | PRN
  Administered 2013-09-09 – 2013-09-11 (×8): 4 mg via ORAL
  Filled 2013-09-09 (×8): qty 2

## 2013-09-09 MED ORDER — ONDANSETRON HCL 4 MG PO TABS: 4.0000 mg | ORAL_TABLET | Freq: Three times a day (TID) | ORAL | Status: DC | PRN

## 2013-09-09 MED ORDER — NEBIVOLOL HCL 5 MG PO TABS
5.0000 mg | ORAL_TABLET | Freq: Every day | ORAL | Status: DC
  Administered 2013-09-09 – 2013-09-10 (×2): 5 mg via ORAL
  Filled 2013-09-09 (×3): qty 1

## 2013-09-09 MED ORDER — DARIFENACIN HYDROBROMIDE ER 7.5 MG PO TB24
7.5000 mg | ORAL_TABLET | Freq: Every day | ORAL | Status: DC
  Administered 2013-09-09 – 2013-09-10 (×2): 7.5 mg via ORAL
  Filled 2013-09-09 (×3): qty 1

## 2013-09-09 NOTE — Progress Notes (Signed)
Patient complains of throat soreness and cough. Back pain stable. She's been up out of bed. She is progressing slowly with therapy.  Afebrile. Vitals stable. Wound clean and dry. Motor and sensory function stable. Abdomen soft.  Progressing slowly. I would like to stop her PCA today. Work towards getting her on oral medications for pain and consider possible discharge tomorrow.

## 2013-09-09 NOTE — Progress Notes (Addendum)
Physical Therapy Treatment Patient Details Name: Cynthia Kirk MRN: [id number] DOB: 07-22-1951 Today's Date: 09/09/2013 Time: 4098-1191 PT Time Calculation (min): 24 min  PT Assessment / Plan / Recommendation  History of Present Illness Admitted for: decompressive thoracic laminectomy, medial facetectomy T11-12 for thoracic spinal stenosis, Posterior fixation L4-5 using cortical pedicle screws.   PT Comments   Pt made great progress this session. Pt able to increase overall ambulation distance and needed less assistance.  Pt not wanting to return to bed or practice bed mobility this session.  However pt will need to practice bed mobility with step into bed prior to d/c home. Practiced bed mobility with step stool last PT treatment however pt needed mod (A) to perform correctly.   Follow Up Recommendations  Home health PT     Equipment Recommendations  Rolling walker with 5" wheels    Frequency Min 5X/week   Progress towards PT Goals Progress towards PT goals: Progressing toward goals  Plan Current plan remains appropriate    Precautions / Restrictions Precautions Precautions: Back Precaution Comments: Reviewed Precautions pt able to recall no bending and no twisting but reeducated on no arching.  Required Braces or Orthoses: Spinal Brace Spinal Brace: Lumbar corset;Applied in sitting position Restrictions Weight Bearing Restrictions: No   Pertinent Vitals/Pain No c/o pain    Mobility  Bed Mobility Bed Mobility: Not assessed Transfers Transfers: Sit to Stand;Stand to Sit Sit to Stand: 5: Supervision Stand to Sit: 5: Supervision Ambulation/Gait Ambulation/Gait Assistance: 4: Min guard;5: Supervision Ambulation Distance (Feet): 200 Feet Assistive device: Rolling walker Ambulation/Gait Assistance Details: Minguard for safety with turns and cues for RW placement Gait Pattern: Step-through pattern;Decreased stride length;Shuffle;Trunk flexed;Decreased trunk rotation Gait  velocity: decreased    Exercises     PT Diagnosis:    PT Problem List:   PT Treatment Interventions:     PT Goals (current goals can now be found in the care plan section) Acute Rehab PT Goals Patient Stated Goal: to be able to go home and care for herself PT Goal Formulation: With patient Time For Goal Achievement: 09/13/13 Potential to Achieve Goals: Good  Visit Information  Last PT Received On: 09/09/13 Assistance Needed: +1 History of Present Illness: Admitted for: decompressive thoracic laminectomy, medial facetectomy T11-12 for thoracic spinal stenosis, Posterior fixation L4-5 using cortical pedicle screws.    Subjective Data  Subjective: I'm doing better today. Patient Stated Goal: to be able to go home and care for herself   Cognition  Cognition Arousal/Alertness: Awake/alert Behavior During Therapy: WFL for tasks assessed/performed Overall Cognitive Status: Within Functional Limits for tasks assessed    Balance     End of Session PT - End of Session Equipment Utilized During Treatment: Gait belt;Back brace Activity Tolerance: Patient tolerated treatment well Patient left: in chair;with call bell/phone within reach Nurse Communication: Mobility status   GP     Jaymason Ledesma 09/09/2013, 4:16 PM   Jake Shark, PT DPT [phone]

## 2013-09-10 MED ORDER — DEXAMETHASONE 2 MG PO TABS
2.0000 mg | ORAL_TABLET | Freq: Three times a day (TID) | ORAL | Status: DC
  Administered 2013-09-10 – 2013-09-11 (×3): 2 mg via ORAL
  Filled 2013-09-10 (×5): qty 1

## 2013-09-10 MED ORDER — ONDANSETRON HCL 4 MG PO TABS
4.0000 mg | ORAL_TABLET | Freq: Four times a day (QID) | ORAL | Status: DC | PRN
  Administered 2013-09-10: 4 mg via ORAL
  Filled 2013-09-10: qty 1

## 2013-09-10 NOTE — Progress Notes (Signed)
Patient ID: Cynthia Kirk, female   DOB: 22-Feb-1951, 62 y.o.   MRN: [id number] Doing better. No leg pain or N/T/W. Walking better. Home tomorrow.

## 2013-09-10 NOTE — Progress Notes (Signed)
Occupational Therapy Treatment Patient Details Name: Cynthia Kirk MRN: [id number] DOB: 23-Sep-1951 Today's Date: 09/10/2013 Time: [phone] (first attempt [phone]) OT Time Calculation (min): 46 min  OT Assessment / Plan / Recommendation  History of present illness Admitted for: decompressive thoracic laminectomy, medial facetectomy T11-12 for thoracic spinal stenosis, Posterior fixation L4-5 using cortical pedicle screws.   OT comments  OT attempting treatment and pt found asleep with breakfast tray present. Room setup for session s/p breakfast completion. Pt agreeable to OT adl at sink in 20 minutes from first attempt. Pt demonstrates adequate level for d/c home from OT standpoint. Pt verbalized " I am ready to go home"   Follow Up Recommendations  Home health OT;Supervision - Intermittent    Barriers to Discharge       Equipment Recommendations  3 in 1 bedside comode;Other (comment)    Recommendations for Other Services    Frequency Min 2X/week   Progress towards OT Goals Progress towards OT goals: Progressing toward goals  Plan Discharge plan remains appropriate    Precautions / Restrictions Precautions Precautions: Back Precaution Comments: Reviewed Precautions pt able to recall no bending and no twisting but reeducated on no arching.  Required Braces or Orthoses: Spinal Brace Spinal Brace: Lumbar corset;Applied in sitting position Restrictions Weight Bearing Restrictions: No   Pertinent Vitals/Pain Minimal pain reported Requesting dressing changed RN KIM in room and exiting room stating " Let me go get what we need and Ill be back" RN pending return at end of session. Pt sitting in chair for dressing change.     ADL  Eating/Feeding: Set up Where Assessed - Eating/Feeding: Chair Grooming: Wash/dry hands;Wash/dry face;Teeth care;Supervision/safety Where Assessed - Grooming: Unsupported standing Upper Body Bathing: Chest;Right arm;Left  arm;Abdomen;Supervision/safety Where Assessed - Upper Body Bathing: Supported sitting Lower Body Bathing: Supervision/safety Where Assessed - Lower Body Bathing: Unsupported sit to stand Upper Body Dressing: Modified independent Where Assessed - Upper Body Dressing: Supported sitting Lower Body Dressing: Supervision/safety Where Assessed - Lower Body Dressing: Unsupported sit to stand Toilet Transfer: Supervision/safety Toilet Transfer Method: Sit to Barista: Regular height toilet;Grab bars Toileting - Clothing Manipulation and Hygiene: Supervision/safety (verbal cues to avoid twisting) Where Assessed - Toileting Clothing Manipulation and Hygiene: Sit to stand from 3-in-1 or toilet Equipment Used: Back brace;Rolling walker Transfers/Ambulation Related to ADLs: Pt ambulating with RW at MOD I during session ADL Comments: Pt needs cues during session for twisting precaution during bed mobility and peri hygiene. Pt with body habitus that has require patient to change reaching and position to keep precautions. Pt asking about showering and not sponge bath. pt educated that MD must approve a shower. Pt with loose dressing for the inferior wound and RN KIM in room to observe/ therapist asking for reinforcement of dressing.Pt very concerned with dressing due to body secretions/ being soiled due to hanging bandage during bowel movements. Pt with incr awareness to back precautions this session    OT Diagnosis:    OT Problem List:   OT Treatment Interventions:     OT Goals(current goals can now be found in the care plan section) Acute Rehab OT Goals Patient Stated Goal: to be able to go home and care for herself OT Goal Formulation: With patient Time For Goal Achievement: 09/21/13 Potential to Achieve Goals: Good ADL Goals Pt Will Perform Lower Body Dressing: with modified independence;with adaptive equipment;sit to/from stand Pt Will Transfer to Toilet: with modified  independence;bedside commode Pt Will Perform Toileting - Clothing Manipulation and  hygiene: with modified independence;sit to/from stand;with adaptive equipment Pt Will Perform Tub/Shower Transfer: shower seat;Tub transfer;with min guard assist;rolling walker  Visit Information  Last OT Received On: 09/10/13 Assistance Needed: +1 History of Present Illness: Admitted for: decompressive thoracic laminectomy, medial facetectomy T11-12 for thoracic spinal stenosis, Posterior fixation L4-5 using cortical pedicle screws.    Subjective Data      Prior Functioning       Cognition  Cognition Arousal/Alertness: Awake/alert Behavior During Therapy: WFL for tasks assessed/performed Overall Cognitive Status: Within Functional Limits for tasks assessed    Mobility  Bed Mobility Bed Mobility: Rolling Left;Left Sidelying to Sit;Sitting - Scoot to [place] of Bed;Supine to Sit Rolling Left: 5: Supervision (verbal cue to prevent twisting) Left Sidelying to Sit: 5: Supervision;HOB flat Supine to Sit: 5: Supervision;HOB flat Sitting - Scoot to Edge of Bed: 5: Supervision Sit to Sidelying Left: 5: Supervision Details for Bed Mobility Assistance: with able to complete without (A) and HOB flat. Adequate level for d/c home Transfers Transfers: Sit to Stand;Stand to Sit Sit to Stand: 5: Supervision;With upper extremity assist;From bed Stand to Sit: 5: Supervision;With upper extremity assist;To chair/3-in-1 Details for Transfer Assistance: incr time to complete task but able to do so at adequate level for d/c    Exercises      Balance Balance Balance Assessed: No   End of Session OT - End of Session Activity Tolerance: Patient tolerated treatment well Patient left: in chair;with call bell/phone within reach Nurse Communication: Mobility status;Precautions  GO     Cynthia Kirk 09/10/2013, 11:44 AM Pager: [phone]

## 2013-09-10 NOTE — Progress Notes (Signed)
Physical Therapy Treatment Patient Details Name: Cynthia Kirk MRN: [id number] DOB: [birth year] Today's Date: 09/10/2013 Time: 1031-1050 PT Time Calculation (min): 19 min  PT Assessment / Plan / Recommendation  History of Present Illness Admitted for: decompressive thoracic laminectomy, medial facetectomy T11-12 for thoracic spinal stenosis, Posterior fixation L4-5 using cortical pedicle screws.   PT Comments   Pt reluctant for participation.  Performed bed mobility with hospital bed at height of pt's personal bed and with use of step stool as pt has at home.  Performed much better today.  Pt ready for D/C from PT standpoint.    Follow Up Recommendations  Home health PT     Does the patient have the potential to tolerate intense rehabilitation     Barriers to Discharge        Equipment Recommendations  Rolling walker with 5" wheels    Recommendations for Other Services    Frequency Min 5X/week   Progress towards PT Goals Progress towards PT goals: Progressing toward goals  Plan Current plan remains appropriate    Precautions / Restrictions Precautions Precautions: Back Precaution Comments: Reviewed Precautions pt able to recall no bending and no twisting but reeducated on no arching.  Required Braces or Orthoses: Spinal Brace Spinal Brace: Lumbar corset;Applied in sitting position Restrictions Weight Bearing Restrictions: No   Pertinent Vitals/Pain Indicates more fatigue than pain.      Mobility  Bed Mobility Bed Mobility: Left Sidelying to Sit;Sitting - Scoot to Edge of Bed;Sit to Sidelying Left Left Sidelying to Sit: 5: Supervision;HOB flat Sitting - Scoot to Edge of Bed: 5: Supervision Sit to Sidelying Left: 5: Supervision Details for Bed Mobility Assistance: pt needs increased time and cueing for back precautions.  pt states she has night stand right next to bed that she uses to A with bringing trunk to/from sitting.  PT held RW alongside bed to simulate this.    Transfers Transfers: Sit to Stand;Stand to Sit Sit to Stand: 5: Supervision;With upper extremity assist;From chair/3-in-1 Stand to Sit: 5: Supervision;With upper extremity assist;To chair/3-in-1 Details for Transfer Assistance: Moves slowly, but able to complete without A.   Ambulation/Gait Ambulation/Gait Assistance: 5: Supervision Ambulation Distance (Feet): 35 Feet Assistive device: Rolling walker Ambulation/Gait Assistance Details: cues for back precautions with turns and as pt was looking in drawers in room.   Gait Pattern: Step-through pattern;Decreased stride length;Shuffle;Trunk flexed Stairs: Yes Stairs Assistance: 4: Min guard Stairs Assistance Details (indicate cue type and reason): pt wanted to step up sideways using one hand on RW and one hand on elevated bed stating this was how she did step at home.  Cues for back precautions and safety.   Stair Management Technique: No rails;Sideways;With walker Number of Stairs: 1 Wheelchair Mobility Wheelchair Mobility: No    Exercises     PT Diagnosis:    PT Problem List:   PT Treatment Interventions:     PT Goals (current goals can now be found in the care plan section) Acute Rehab PT Goals Time For Goal Achievement: 09/13/13 Potential to Achieve Goals: Good  Visit Information  Last PT Received On: 09/10/13 Assistance Needed: +1 History of Present Illness: Admitted for: decompressive thoracic laminectomy, medial facetectomy T11-12 for thoracic spinal stenosis, Posterior fixation L4-5 using cortical pedicle screws.    Subjective Data      Cognition  Cognition Arousal/Alertness: Awake/alert Behavior During Therapy: WFL for tasks assessed/performed Overall Cognitive Status: Within Functional Limits for tasks assessed    Balance  Balance Balance Assessed: No  End of Session PT - End of Session Equipment Utilized During Treatment: Gait belt;Back brace Activity Tolerance: Patient tolerated treatment well Patient left:  in chair;with call bell/phone within reach Nurse Communication: Mobility status   GP     Sunny Schlein, [place] [phone] 09/10/2013, 11:22 AM

## 2013-09-11 MED ORDER — METHOCARBAMOL 500 MG PO TABS: 500.0000 mg | ORAL_TABLET | Freq: Four times a day (QID) | ORAL | Status: DC | PRN

## 2013-09-11 MED ORDER — HYDROCODONE-ACETAMINOPHEN 10-325 MG PO TABS: 2.0000 | ORAL_TABLET | ORAL | Status: DC | PRN

## 2013-09-11 NOTE — Progress Notes (Signed)
PT Cancellation Note  Patient Details Name: Lolah Coghlan MRN: [id number] DOB: March 21, 1951   Cancelled Treatment:    Reason Eval/Treat Not Completed: Pain limiting ability to participate;Fatigue/lethargy limiting ability to participate;Patient declined, no reason specified. Attempted x2. Patient refusing therapy at this time. She is planning to DC home today   Fredrich Birks 09/11/2013, 11:55 AM

## 2013-09-11 NOTE — Progress Notes (Signed)
Pt d/c home [facility] notified about #-1- DME case management Pennin J on call notified and will take care of it tomorrow. Pt telephone # given for [facility] to contact. Pt in am

## 2013-09-11 NOTE — Discharge Summary (Signed)
Physician Discharge Summary  Patient ID: Cynthia Kirk MRN: [id number] DOB/AGE: 1951-04-16 62 y.o.  Admit date: 09/05/2013 Discharge date: 09/11/2013  Admission Diagnoses: lumbar and thoracic stenosis   Discharge Diagnoses: same   Discharged Condition: good  Hospital Course: The patient was admitted on 09/05/2013 and taken to the operating room where the patient underwent Thoracic laminectomy and lumbar fusion. The patient tolerated the procedure well and was taken to the recovery room and then to the floor in stable condition. The hospital course was routine. There were no complications. The wound remained clean dry and intact. Pt had appropriate back soreness. No complaints of leg pain or new N/T/W. The patient remained afebrile with stable vital signs, and tolerated a regular diet. The patient continued to increase activities, and pain was well controlled with oral pain medications.   Consults: None  Significant Diagnostic Studies:  Results for orders placed during the hospital encounter of 09/05/13  GLUCOSE, CAPILLARY      Result Value Range   Glucose-Capillary 118 (*) 70 - 99 mg/dL  GLUCOSE, CAPILLARY      Result Value Range   Glucose-Capillary 94  70 - 99 mg/dL  GLUCOSE, CAPILLARY      Result Value Range   Glucose-Capillary 111 (*) 70 - 99 mg/dL  GLUCOSE, CAPILLARY      Result Value Range   Glucose-Capillary 113 (*) 70 - 99 mg/dL  GLUCOSE, CAPILLARY      Result Value Range   Glucose-Capillary 138 (*) 70 - 99 mg/dL  GLUCOSE, CAPILLARY      Result Value Range   Glucose-Capillary 105 (*) 70 - 99 mg/dL  GLUCOSE, CAPILLARY      Result Value Range   Glucose-Capillary 103 (*) 70 - 99 mg/dL    Dg Chest 2 View  6/57/8469   CLINICAL DATA:  Pre admit lumbar fusion  EXAM: CHEST  2 VIEW  COMPARISON:  06/20/2012  FINDINGS: Heart size and vascularity are normal. Chronic interstitial scarring in the upper lobes is stable. No mass or adenopathy. Negative for pneumonia or effusion.  Negative for heart failure.  IMPRESSION: Chronic apical scarring bilaterally. No acute abnormality and no change from the prior study.   Electronically Signed   By: Marlan Palau M.D.   On: 08/29/2013 11:43   Dg Thoracolumabar Spine  09/05/2013   *RADIOLOGY REPORT*  Clinical Data: PLIF L4 - L5, T11 - T12 laminectomy  DG C-ARM 61-120 MIN,THORACOLUMBAR SPINE - 2 VIEW  Comparison:  Lumbar spine MRI - 03/21/2013 (outside examination); CT abdomen pelvis - 03/21/2023  Findings:  Four spot intraoperative radiographic images of the thoracic and lumbar spine are provided for review. Spinal labeling is in keeping with prior abdominal CT at which time it was noted there were 5 non- rib bearing lumbar type vertebral bodies with normal appearing ribs seen bilaterally at T12.  Spinal labeling of the two initial provided images (labeled #2 and 3) of the thoracolumbar junction are degraded secondary to exclusion of the lumbosacral sacral junction.  There is a marking instruments seen posterior to the approximate T12 vertebral body. Additional radiopaque support apparatus is seen posterior to the operative site.  Images labeled #6 and 7 demonstrate the sequela of L4 - L5 paraspinal fusion and intervertebral disc space replacement.  There is restoration of the L4 - L5 intervertebral disc space.  Alignment appears near anatomic. No definite radiopaque foreign body.  IMPRESSION: 1.  Post L4 - L5 PLIF. 2.  Intraoperative localization of the T12 vertebral body.  Original Report Authenticated By: Tacey Ruiz, MD   Dg Chest Port 1 View  09/05/2013   CLINICAL DATA:  Central line placement  EXAM: PORTABLE CHEST - 1 VIEW  COMPARISON:  Portable exam 1507 hr compared to 08/29/2013  FINDINGS: Right jugular central venous catheter with tip projecting over the proximal SVC.  Upper normal heart size.  Stable mediastinal contours.  Chronic accentuation of markings is seen in the upper lungs bilaterally, question slightly increased in right  upper lobe versus differences in technique.  No pleural effusion or pneumothorax.  Bones unremarkable.  IMPRESSION: No pneumothorax following right jugular line placement.  Bilateral upper lobe opacities question slightly increased in right upper lobe.   Electronically Signed   By: Ulyses Southward M.D.   On: 09/05/2013 15:28   Dg C-arm 61-120 Min  09/05/2013   *RADIOLOGY REPORT*  Clinical Data: PLIF L4 - L5, T11 - T12 laminectomy  DG C-ARM 61-120 MIN,THORACOLUMBAR SPINE - 2 VIEW  Comparison:  Lumbar spine MRI - 03/21/2013 (outside examination); CT abdomen pelvis - 03/21/2023  Findings:  Four spot intraoperative radiographic images of the thoracic and lumbar spine are provided for review. Spinal labeling is in keeping with prior abdominal CT at which time it was noted there were 5 non- rib bearing lumbar type vertebral bodies with normal appearing ribs seen bilaterally at T12.  Spinal labeling of the two initial provided images (labeled #2 and 3) of the thoracolumbar junction are degraded secondary to exclusion of the lumbosacral sacral junction.  There is a marking instruments seen posterior to the approximate T12 vertebral body. Additional radiopaque support apparatus is seen posterior to the operative site.  Images labeled #6 and 7 demonstrate the sequela of L4 - L5 paraspinal fusion and intervertebral disc space replacement.  There is restoration of the L4 - L5 intervertebral disc space.  Alignment appears near anatomic. No definite radiopaque foreign body.  IMPRESSION: 1.  Post L4 - L5 PLIF. 2.  Intraoperative localization of the T12 vertebral body.   Original Report Authenticated By: Tacey Ruiz, MD    Antibiotics:  Anti-infectives   Start     Dose/Rate Route Frequency Ordered Stop   09/05/13 1900  ceFAZolin (ANCEF) IVPB 1 g/50 mL premix     1 g 100 mL/hr over 30 Minutes Intravenous Every 8 hours 09/05/13 1810 09/06/13 0419   09/05/13 1123  bacitracin 50,000 Units in sodium chloride irrigation 0.9 %  500 mL irrigation  Status:  Discontinued       As needed 09/05/13 1123 09/05/13 1433   09/05/13 0600  ceFAZolin (ANCEF) IVPB 2 g/50 mL premix     2 g 100 mL/hr over 30 Minutes Intravenous On call to O.R. 09/04/13 1427 09/05/13 1049      Discharge Exam: Blood pressure 143/69, pulse 61, temperature 97.9 F (36.6 C), temperature source Oral, resp. rate 18, height 5\' 4"  (1.626 m), weight 112.492 kg (248 lb), last menstrual period 03/13/2013, SpO2 100.00%. Neurologic: Grossly normal Incision CDI  Discharge Medications:     Medication List         albuterol 108 (90 BASE) MCG/ACT inhaler  Commonly known as:  PROVENTIL HFA;VENTOLIN HFA  Inhale 2 puffs into the lungs 4 (four) times daily as needed for wheezing or shortness of breath.     albuterol (2.5 MG/3ML) 0.083% nebulizer solution  Commonly known as:  PROVENTIL  Take 2.5 mg by nebulization every 6 (six) hours as needed for shortness of breath.  ALPRAZolam 1 MG tablet  Commonly known as:  XANAX  Take 1 tablet (1 mg total) by mouth 2 (two) times daily as needed for sleep or anxiety.     cyanocobalamin 1000 MCG/ML injection  Commonly known as:  (VITAMIN B-12)  Inject 1,000 mcg into the muscle every 30 (thirty) days.     DEXILANT 60 MG capsule  Generic drug:  dexlansoprazole  TAKE 1 CAPSULE (60 MG TOTAL) BY MOUTH DAILY.     HYDROcodone-acetaminophen 10-325 MG per tablet  Commonly known as:  NORCO  Take 2 tablets by mouth every 4 (four) hours as needed for pain.     HYDROmorphone 4 MG tablet  Commonly known as:  DILAUDID  Take 4 mg by mouth every 4 (four) hours as needed for pain. For back pain     hydroquinone 4 % cream  Apply topically 2 (two) times daily.     medroxyPROGESTERone 10 MG tablet  Commonly known as:  PROVERA  Take 10 mg by mouth. Every 10 days     methocarbamol 500 MG tablet  Commonly known as:  ROBAXIN  Take 1 tablet (500 mg total) by mouth every 6 (six) hours as needed.     mometasone 220 MCG/INH  inhaler  Commonly known as:  ASMANEX 60 METERED DOSES  Inhale 2 puffs into the lungs at bedtime.     ASMANEX 60 METERED DOSES 220 MCG/INH inhaler  Generic drug:  mometasone  INHALE 2 PUFFS INTO THE LUNGS AT BEDTIME     montelukast 10 MG tablet  Commonly known as:  SINGULAIR     montelukast 10 MG tablet  Commonly known as:  SINGULAIR  TAKE 1 TABLET (10 MG TOTAL) BY MOUTH AT BEDTIME.     multivitamin with minerals Tabs tablet  Take 1 tablet by mouth daily.     nebivolol 5 MG tablet  Commonly known as:  BYSTOLIC  TAKE 1 TABLET (5 MG TOTAL) BY MOUTH DAILY.     nystatin cream  Commonly known as:  MYCOSTATIN  Apply topically as needed for dry skin.     olmesartan-hydrochlorothiazide 40-25 MG per tablet  Commonly known as:  [name] HCT  TAKE 1 TABLET BY MOUTH DAILY.     polyethylene glycol powder powder  Commonly known as:  GLYCOLAX/MIRALAX  Take 17 g by mouth 2 (two) times daily.     promethazine 12.5 MG tablet  Commonly known as:  PHENERGAN  Take 12.5 mg by mouth every 6 (six) hours as needed for nausea.     ranitidine 300 MG tablet  Commonly known as:  ZANTAC  Take 1 tablet (300 mg total) by mouth at bedtime.     solifenacin 10 MG tablet  Commonly known as:  VESICARE  Take 10 mg by mouth daily.     SYRINGE/NEEDLE (DISP) 1 ML 25G X 5/8" 1 ML Misc  Commonly known as:  B-D SYRINGE/NEEDLE 1CC/25GX5/8  Use 1 monthly to administer her b12 injection     Vitamin D 2000 UNITS tablet  Take 1,000 Units by mouth daily.        Disposition: home   Final Dx: 1. Thoracic laminectomy, 2. Lumbar fusion      Discharge Orders   Future Appointments Provider Department Dept Phone   02/13/2014 11:00 AM Newt Lukes, MD [facility] [phone]   Future Orders Complete By Expires   Call MD for:  difficulty breathing, headache or visual disturbances  As directed    Call MD  for:  persistant nausea and vomiting  As directed    Call MD for:  redness,  tenderness, or signs of infection (pain, swelling, redness, odor or green/yellow discharge around incision site)  As directed    Call MD for:  severe uncontrolled pain  As directed    Call MD for:  temperature >100.4  As directed    Diet - low sodium heart healthy  As directed    Discharge instructions  As directed    Comments:     No bending, twisting, or strenuous activity. May shower normally   Increase activity slowly  As directed       Follow-up Information   Follow up with JONES,DAVID S, MD. Schedule an appointment as soon as possible for a visit in 2 weeks.   Specialty:  Neurosurgery   Contact information:   [address] [place] [address] [phone]        Signed: Tia Alert 09/11/2013, 8:00 AM

## 2013-09-11 NOTE — Progress Notes (Signed)
Per MD order, central line removed. IV cathter intact. Vaseline pressure gauze to site, pressure held x 5 min, no bleeding to site. Pt instructed not to get out of bed for 30 min after the removal of the central line. Instucted to keep dressing CDI x 24hours, if bleeding occurs hold pressure, if bleeding does not stop contact MD or go to the ED. Pt verbalized understanding and did not have any questions. Edom Schmuhl M  

## 2013-09-11 NOTE — Progress Notes (Signed)
Pt d/c home . D/c and follow up instruction given to patient   Rolling walker delivered but pt has not gotten  3-in -1  DME  [facility] the patient medicated for pain and awaiting husband for pick up

## 2013-09-14 DIAGNOSIS — IMO0001 Reserved for inherently not codable concepts without codable children: Secondary | ICD-10-CM | POA: Diagnosis not present

## 2013-09-14 DIAGNOSIS — M545 Low back pain, unspecified: Secondary | ICD-10-CM | POA: Diagnosis not present

## 2013-09-14 DIAGNOSIS — M79609 Pain in unspecified limb: Secondary | ICD-10-CM | POA: Diagnosis not present

## 2013-09-14 DIAGNOSIS — Z4789 Encounter for other orthopedic aftercare: Secondary | ICD-10-CM | POA: Diagnosis not present

## 2013-09-19 DIAGNOSIS — M545 Low back pain, unspecified: Secondary | ICD-10-CM | POA: Diagnosis not present

## 2013-09-19 DIAGNOSIS — M79609 Pain in unspecified limb: Secondary | ICD-10-CM | POA: Diagnosis not present

## 2013-09-19 DIAGNOSIS — Z4789 Encounter for other orthopedic aftercare: Secondary | ICD-10-CM | POA: Diagnosis not present

## 2013-09-19 DIAGNOSIS — IMO0001 Reserved for inherently not codable concepts without codable children: Secondary | ICD-10-CM | POA: Diagnosis not present

## 2013-09-20 ENCOUNTER — Telehealth: Payer: Self-pay | Admitting: *Deleted

## 2013-09-20 DIAGNOSIS — Z4789 Encounter for other orthopedic aftercare: Secondary | ICD-10-CM | POA: Diagnosis not present

## 2013-09-20 DIAGNOSIS — IMO0001 Reserved for inherently not codable concepts without codable children: Secondary | ICD-10-CM | POA: Diagnosis not present

## 2013-09-20 DIAGNOSIS — M545 Low back pain, unspecified: Secondary | ICD-10-CM | POA: Diagnosis not present

## 2013-09-20 DIAGNOSIS — M79609 Pain in unspecified limb: Secondary | ICD-10-CM | POA: Diagnosis not present

## 2013-09-20 NOTE — Telephone Encounter (Signed)
Burtis Junes called states pt was recently discharged from back surgery.  She has productive cough, 95 % O2. 97.7 temp.  please advise

## 2013-09-20 NOTE — Telephone Encounter (Signed)
OV advised if persisting symptoms. At this time, no antibiotics appear to be indicated. Okay to use Mucinex or Robitussin for her cough symptoms

## 2013-09-20 NOTE — Telephone Encounter (Signed)
Spoke with pt advised of MDs message 

## 2013-09-21 DIAGNOSIS — M79609 Pain in unspecified limb: Secondary | ICD-10-CM | POA: Diagnosis not present

## 2013-09-21 DIAGNOSIS — M545 Low back pain, unspecified: Secondary | ICD-10-CM | POA: Diagnosis not present

## 2013-09-21 DIAGNOSIS — IMO0001 Reserved for inherently not codable concepts without codable children: Secondary | ICD-10-CM | POA: Diagnosis not present

## 2013-09-21 DIAGNOSIS — Z4789 Encounter for other orthopedic aftercare: Secondary | ICD-10-CM | POA: Diagnosis not present

## 2013-09-22 ENCOUNTER — Encounter: Payer: Self-pay | Admitting: Family Medicine

## 2013-09-22 ENCOUNTER — Ambulatory Visit (INDEPENDENT_AMBULATORY_CARE_PROVIDER_SITE_OTHER): Payer: Medicare Other | Admitting: Family Medicine

## 2013-09-22 VITALS — BP 100/70 | HR 90 | Temp 100.0°F | Wt 262.0 lb

## 2013-09-22 DIAGNOSIS — J99 Respiratory disorders in diseases classified elsewhere: Secondary | ICD-10-CM | POA: Diagnosis not present

## 2013-09-22 DIAGNOSIS — D869 Sarcoidosis, unspecified: Secondary | ICD-10-CM | POA: Diagnosis not present

## 2013-09-22 DIAGNOSIS — D860 Sarcoidosis of lung: Secondary | ICD-10-CM

## 2013-09-22 DIAGNOSIS — J45909 Unspecified asthma, uncomplicated: Secondary | ICD-10-CM

## 2013-09-22 DIAGNOSIS — J4530 Mild persistent asthma, uncomplicated: Secondary | ICD-10-CM

## 2013-09-22 MED ORDER — HYDROCODONE-HOMATROPINE 5-1.5 MG/5ML PO SYRP: 5.0000 mL | ORAL_SOLUTION | Freq: Three times a day (TID) | ORAL | Status: DC | PRN

## 2013-09-22 NOTE — Progress Notes (Signed)
  Subjective:    Patient ID: Cynthia Kirk, female    DOB: [birth year], 62 y.o.   MRN: [id number]  Cynthia Kirk is a 62 year old female married nonsmoker who comes in today accompanied by her husband for evaluation of a cough  She states she was discharged from the hospital on Tuesday after a spinal procedure by Dr. Yetta Barre. During her hospital stay she developed a slight cough. She has a history of asthma and sarcoidosis. She had an evaluation and there is no evidence of any secondary infection. She was treated symptomatically with liquids and Tussionex. She was sent home with no cough syrup.  She does have a history of allergic rhinitis and asthma but she's taking her asthma medications meticulously and doesn't feel like she is wheezing. She's using albuterol 1 or 2 puffs 3 times daily and is not using her nebulizer. She is using her inhaled steroid. Her pulmonologist is Dr. Ezekiel Ina had no fever sputum production etc.    Review of Systems    review of systems otherwise negative Objective:   Physical Exam  Well-developed well-nourished overweight female in no acute distress she has a walker and a brace around her back. Her vital signs are stable she is afebrile the brace is removed lung exam shows symmetrical breath sounds no wheezing      Assessment & Plan:  Cough,,,,,,,,,,,,, could be viral could be a flareup in her sarcoidosis it does not appear to be a secondary bacterial infection. Also could be a flareup of her asthma although I can appreciate no wheezing and she indeed does not feel like it's her asthma.  Rest at home cough syrup to get her through the weekend pulmonary consult on Monday with her pulmonologist she may need to go back on steroids however with her diabetes and recent lumbar disc surgery I would defer that to her pulmonologist

## 2013-09-22 NOTE — Patient Instructions (Signed)
Rest at home issue currently doing  Drink lots of water  Hydromet,,,,,,,,,, 1/2-1 teaspoon 3 times daily when necessary for cough  Call your pulmonologist on Monday for evaluation

## 2013-09-25 DIAGNOSIS — Z4789 Encounter for other orthopedic aftercare: Secondary | ICD-10-CM | POA: Diagnosis not present

## 2013-09-25 DIAGNOSIS — M79609 Pain in unspecified limb: Secondary | ICD-10-CM | POA: Diagnosis not present

## 2013-09-25 DIAGNOSIS — M545 Low back pain, unspecified: Secondary | ICD-10-CM | POA: Diagnosis not present

## 2013-09-25 DIAGNOSIS — IMO0001 Reserved for inherently not codable concepts without codable children: Secondary | ICD-10-CM | POA: Diagnosis not present

## 2013-09-26 DIAGNOSIS — M79609 Pain in unspecified limb: Secondary | ICD-10-CM | POA: Diagnosis not present

## 2013-09-26 DIAGNOSIS — M545 Low back pain, unspecified: Secondary | ICD-10-CM | POA: Diagnosis not present

## 2013-09-26 DIAGNOSIS — IMO0001 Reserved for inherently not codable concepts without codable children: Secondary | ICD-10-CM | POA: Diagnosis not present

## 2013-09-26 DIAGNOSIS — Z4789 Encounter for other orthopedic aftercare: Secondary | ICD-10-CM | POA: Diagnosis not present

## 2013-09-27 DIAGNOSIS — M545 Low back pain, unspecified: Secondary | ICD-10-CM | POA: Diagnosis not present

## 2013-09-27 DIAGNOSIS — Z4789 Encounter for other orthopedic aftercare: Secondary | ICD-10-CM | POA: Diagnosis not present

## 2013-09-27 DIAGNOSIS — M79609 Pain in unspecified limb: Secondary | ICD-10-CM | POA: Diagnosis not present

## 2013-09-27 DIAGNOSIS — IMO0001 Reserved for inherently not codable concepts without codable children: Secondary | ICD-10-CM | POA: Diagnosis not present

## 2013-10-03 ENCOUNTER — Ambulatory Visit: Payer: Medicare Other | Admitting: Internal Medicine

## 2013-10-03 DIAGNOSIS — M79609 Pain in unspecified limb: Secondary | ICD-10-CM | POA: Diagnosis not present

## 2013-10-03 DIAGNOSIS — Z4789 Encounter for other orthopedic aftercare: Secondary | ICD-10-CM | POA: Diagnosis not present

## 2013-10-03 DIAGNOSIS — M545 Low back pain, unspecified: Secondary | ICD-10-CM | POA: Diagnosis not present

## 2013-10-03 DIAGNOSIS — IMO0001 Reserved for inherently not codable concepts without codable children: Secondary | ICD-10-CM | POA: Diagnosis not present

## 2013-10-06 DIAGNOSIS — M545 Low back pain, unspecified: Secondary | ICD-10-CM | POA: Diagnosis not present

## 2013-10-06 DIAGNOSIS — M79609 Pain in unspecified limb: Secondary | ICD-10-CM | POA: Diagnosis not present

## 2013-10-06 DIAGNOSIS — IMO0001 Reserved for inherently not codable concepts without codable children: Secondary | ICD-10-CM | POA: Diagnosis not present

## 2013-10-06 DIAGNOSIS — Z4789 Encounter for other orthopedic aftercare: Secondary | ICD-10-CM | POA: Diagnosis not present

## 2013-10-10 DIAGNOSIS — Z4789 Encounter for other orthopedic aftercare: Secondary | ICD-10-CM | POA: Diagnosis not present

## 2013-10-10 DIAGNOSIS — M79609 Pain in unspecified limb: Secondary | ICD-10-CM | POA: Diagnosis not present

## 2013-10-10 DIAGNOSIS — M545 Low back pain, unspecified: Secondary | ICD-10-CM | POA: Diagnosis not present

## 2013-10-10 DIAGNOSIS — IMO0001 Reserved for inherently not codable concepts without codable children: Secondary | ICD-10-CM | POA: Diagnosis not present

## 2013-10-11 ENCOUNTER — Encounter: Payer: Self-pay | Admitting: Internal Medicine

## 2013-10-11 ENCOUNTER — Ambulatory Visit
Admission: RE | Admit: 2013-10-11 | Discharge: 2013-10-11 | Disposition: A | Discharge status: Home / Self Care | Payer: Medicare Other | Source: Ambulatory Visit | Attending: Internal Medicine | Admitting: Internal Medicine

## 2013-10-11 ENCOUNTER — Ambulatory Visit (INDEPENDENT_AMBULATORY_CARE_PROVIDER_SITE_OTHER): Payer: Medicare Other | Admitting: Internal Medicine

## 2013-10-11 VITALS — BP 130/86 | HR 83 | Temp 98.3°F | Ht 64.25 in | Wt 237.0 lb

## 2013-10-11 DIAGNOSIS — R059 Cough, unspecified: Secondary | ICD-10-CM

## 2013-10-11 DIAGNOSIS — IMO0001 Reserved for inherently not codable concepts without codable children: Secondary | ICD-10-CM | POA: Diagnosis not present

## 2013-10-11 DIAGNOSIS — J99 Respiratory disorders in diseases classified elsewhere: Secondary | ICD-10-CM | POA: Diagnosis not present

## 2013-10-11 DIAGNOSIS — Z4789 Encounter for other orthopedic aftercare: Secondary | ICD-10-CM | POA: Diagnosis not present

## 2013-10-11 DIAGNOSIS — D869 Sarcoidosis, unspecified: Secondary | ICD-10-CM

## 2013-10-11 DIAGNOSIS — D860 Sarcoidosis of lung: Secondary | ICD-10-CM

## 2013-10-11 DIAGNOSIS — M79609 Pain in unspecified limb: Secondary | ICD-10-CM | POA: Diagnosis not present

## 2013-10-11 DIAGNOSIS — R05 Cough: Secondary | ICD-10-CM

## 2013-10-11 DIAGNOSIS — M545 Low back pain, unspecified: Secondary | ICD-10-CM | POA: Diagnosis not present

## 2013-10-11 MED ORDER — PREDNISONE (PAK) 10 MG PO TABS: ORAL_TABLET | ORAL | Status: DC

## 2013-10-11 MED ORDER — AMOXICILLIN-POT CLAVULANATE 875-125 MG PO TABS: 1.0000 | ORAL_TABLET | Freq: Two times a day (BID) | ORAL | Status: DC

## 2013-10-11 NOTE — Progress Notes (Signed)
Chief Complaint  Patient presents with  . Acute Visit    Increased SOB, bodyaches w/ cough and mucus green, yellow and red in color    History of Present Illness: Cynthia Kirk is a 62 y.o. female with Asthma, Sarcoidosis, OSA, Rhinitis, and Hoarseness.  She has not been able to go to [facility] center yet due to transportation issues.   She uses asmanex and singulair daily.  She gets a cough in the morning after waking up, but is fine otherwise.  Her hoarseness occurs more with change in weather.  She denies wheeze, chest tightness, or fever.     10/11/2013  Acute  ov/Wert re: sob/cough on asthmanex Chief Complaint  Patient presents with  . Acute Visit    Increased SOB, bodyaches w/ cough and mucus green, yellow and red in color    Sick since Oct 2nd, onset was abrupt "like a head cold that went into my chest" and sob with activity but not at rest, not usually requiring neb but has needed it since onset with some benefit.  No obvious day to day or daytime variabilty or assoc c cp or chest tightness, subjective wheeze overt   hb symptoms. No unusual exp hx or h/o childhood pna/ asthma or knowledge of premature birth.  Sleeping ok without nocturnal  or early am exacerbation  of respiratory  c/o's or need for noct saba. Also denies any obvious fluctuation of symptoms with weather or environmental changes or other aggravating or alleviating factors except as outlined above   Current Medications, Allergies, Complete Past Medical History, Past Surgical History, Family History, and Social History were reviewed in [employer] record.  ROS  The following are not active complaints unless bolded sore throat, dysphagia, dental problems, itching, sneezing,  nasal congestion or excess/ purulent secretions, ear ache,   fever, chills, sweats, unintended wt loss, pleuritic or exertional cp, hemoptysis,  orthopnea pnd or leg swelling, presyncope, palpitations, heartburn,  abdominal pain, anorexia, nausea, vomiting, diarrhea  or change in bowel or urinary habits, change in stools or urine, dysuria,hematuria,  rash, arthralgias, visual complaints, headache, numbness weakness or ataxia or problems with walking or coordination,  change in mood/affect or memory.           TESTS: Spirometry 06/30/09 >> FEV1 1.69(87%), FVC 2.17(83%), FEV1% 78  Past Medical History  Diagnosis Date  . Hiatal hernia   . Morbid obesity 2010  . Iron deficiency anemia, unspecified   . Esophageal reflux   . Allergic rhinitis, cause unspecified   . Unspecified asthma(493.90)   . Sarcoidosis 1996    @ [employer]  . Unspecified menopausal and postmenopausal disorder   . Intestinal infection due to Clostridium difficile   . Iron deficiency anemia   . Syncope     ?? Syncope ??  . Ejection fraction     EF 45-50%, echo, October 27, 2011  . H/O menorrhagia 2011  . Endometrial mass 2011  . Urinary incontinence, mixed 2011  . Simple endometrial hyperplasia 2011  . Herpes simplex type 2 infection complicating pregnancy   . ASCUS with positive high risk HPV   . PMB (postmenopausal bleeding) 05/13/2010  . H/O goiter   . VIN II (vulvar intraepithelial neoplasia II)   . Fibroid   . Anxiety   . H/O Clostridium difficile infection 03/2010  . Adenomatous colon polyp   . Blood transfusion 1976    Due to ectopic pregnancy  . Abnormal Pap smear 2008  . H/O varicella   .  History of measles, mumps, or rubella   . Pain, pelvic, female 2010  . TMJ (dislocation of temporomandibular joint)   . Obstructive sleep apnea (adult) (pediatric)     cpap  . Complication of anesthesia     difficult iv access, usually has a picc  . Hypertension     dr Felicity Coyer  . Pneumonia 2005; 2007; 2009; 2011  . Chronic bronchitis     "not in the last 2 yr; used to get it frequently" (09/05/2013)  . Exertional shortness of breath   . Type 2 diabetes, diet controlled   . Spinal stenosis, lumbar   . Migraine      "haven't had one in years now" (09/05/2013)  . Arthritis     "joints" (09/05/2013)  . Chronic back pain   . Panic attacks   . Vulvar cancer     Past Surgical History  Procedure Laterality Date  . Vulvectomy  ~ 2007    "for cancerous cells" (09/05/2013)  . Ectopic pregnancy surgery  1976; ~ 1978  . Urethral sling  2011  . Dilation and curettage of uterus  2007  . Posterior laminectomy / decompression lumbar spine  09/05/2013  . Thoracic laminectomy  09/05/2013  . Posterior lumbar fusion  09/05/2013  . Hysteroscopy diagnostic  2011  . Colposcopy vulva w/ biopsy      "I've had maybe 3" (09/05/2013)  . Maximum access (mas)posterior lumbar interbody fusion (plif) 1 level N/A 09/05/2013    Procedure: Lumbar four-five Maximum Access Surgery  Posterior lumbar interbody fusion;  Surgeon: Tia Alert, MD;  Location: [facility];  Service: Neurosurgery;  Laterality: N/A;  Lumbar four-five Maximum Access Surgery  Posterior lumbar interbody fusion  . Lumbar laminectomy/decompression microdiscectomy N/A 09/05/2013    Procedure: Thoracic eleven-twelve Thoracic Laminectomy;  Surgeon: Tia Alert, MD;  Location: [facility];  Service: Neurosurgery;  Laterality: N/A;  Thoracic eleven-twelve Thoracic Laminectomy    Outpatient Encounter Prescriptions as of 10/11/2013  Medication Sig  . albuterol (PROVENTIL HFA;VENTOLIN HFA) 108 (90 BASE) MCG/ACT inhaler Inhale 2 puffs into the lungs 4 (four) times daily as needed for wheezing or shortness of breath.  [name] albuterol (PROVENTIL) (2.5 MG/3ML) 0.083% nebulizer solution Take 2.5 mg by nebulization every 6 (six) hours as needed for shortness of breath.  . ALPRAZolam (XANAX) 1 MG tablet Take 1 tablet (1 mg total) by mouth 2 (two) times daily as needed for sleep or anxiety.  . ASMANEX 60 METERED DOSES 220 MCG/INH inhaler INHALE 2 PUFFS INTO THE LUNGS AT BEDTIME  . Cholecalciferol (VITAMIN D) 2000 UNITS tablet Take 1,000 Units by mouth daily.   . cyanocobalamin  (,VITAMIN B-12,) 1000 MCG/ML injection Inject 1,000 mcg into the muscle every 30 (thirty) days.  [name] DEXILANT 60 MG capsule TAKE 1 CAPSULE (60 MG TOTAL) BY MOUTH DAILY.  [name] HYDROcodone-acetaminophen (NORCO) 10-325 MG per tablet Take 2 tablets by mouth every 4 (four) hours as needed for pain.  [name] HYDROcodone-homatropine (HYCODAN) 5-1.5 MG/5ML syrup Take 5 mLs by mouth every 8 (eight) hours as needed for cough.  [name] HYDROmorphone (DILAUDID) 4 MG tablet Take 4 mg by mouth every 4 (four) hours as needed for pain. For back pain  . hydroquinone 4 % cream Apply topically 2 (two) times daily.  . medroxyPROGESTERone (PROVERA) 10 MG tablet Take 10 mg by mouth. Every 10 days  . methocarbamol (ROBAXIN) 500 MG tablet Take 1 tablet (500 mg total) by mouth every 6 (six) hours as needed.  [name]  montelukast (SINGULAIR) 10 MG tablet TAKE 1 TABLET (10 MG TOTAL) BY MOUTH AT BEDTIME.  . Multiple Vitamin (MULTIVITAMIN WITH MINERALS) TABS tablet Take 1 tablet by mouth daily.  . nebivolol (BYSTOLIC) 5 MG tablet TAKE 1 TABLET (5 MG TOTAL) BY MOUTH DAILY.  [name] nystatin cream (MYCOSTATIN) Apply topically as needed for dry skin.  [name] olmesartan-hydrochlorothiazide ([name] HCT) 40-25 MG per tablet TAKE 1 TABLET BY MOUTH DAILY.  [name] polyethylene glycol powder (GLYCOLAX/MIRALAX) powder Take 17 g by mouth 2 (two) times daily.  . promethazine (PHENERGAN) 12.5 MG tablet Take 12.5 mg by mouth every 6 (six) hours as needed for nausea.  . ranitidine (ZANTAC) 300 MG tablet Take 1 tablet (300 mg total) by mouth at bedtime.  . solifenacin (VESICARE) 10 MG tablet Take 10 mg by mouth daily.   . SYRINGE/NEEDLE, DISP, 1 ML (B-D SYRINGE/NEEDLE 1CC/25GX5/8) 25G X 5/8" 1 ML MISC Use 1 monthly to administer her b12 injection  . [DISCONTINUED] mometasone (ASMANEX 60 METERED DOSES) 220 MCG/INH inhaler Inhale 2 puffs into the lungs at bedtime.  . [DISCONTINUED] montelukast (SINGULAIR) 10 MG tablet     Allergies  Allergen Reactions  . Aspirin     REACTION:  hives  . Latex     REACTION: rash    Physical Exam:  Filed Vitals:   10/11/13 1121  BP: 130/86  Pulse: 83  Temp: 98.3 F (36.8 C)  TempSrc: Oral  Height: 5' 4.25" (1.632 m)  Weight: 237 lb (107.502 kg)  SpO2: 100%     Body mass index is 40.36 kg/(m^2).   Wt Readings from Last 2 Encounters:  10/11/13 237 lb (107.502 kg)  09/22/13 262 lb (118.842 kg)     General - No distress ENT - No sinus tenderness, no oral exudate, no LAN Cardiac - s1s2 regular, no murmur Chest - min insp and exp rhonchi with prominent pseudowheeze Back - No focal tenderness Abd - Soft, non-tender Ext - No edema Neuro - Normal strength Skin - No rashes Psych - normal mood, and behavior    cxr 10/11/13 ordered/ not done

## 2013-10-11 NOTE — Patient Instructions (Addendum)
Prednisone 10 mg take  4 each am x 2 days,   2 each am x 2 days,  1 each am x 2 days and stop  Augmentin 875 mg take one pill twice daily  X 10 days - take at breakfast and supper with large glass of water.  It would help reduce the usual side effects (diarrhea and yeast infections) if you ate cultured yogurt at lunch.   Stop the asthmanex for now  Continue dexilant before bfast and Zantac at bedtime  GERD (REFLUX)  is an extremely common cause of respiratory symptoms, many times with no significant heartburn at all.    It can be treated with medication, but also with lifestyle changes including avoidance of late meals, excessive alcohol, smoking cessation, and avoid fatty foods, chocolate, peppermint, colas, red wine, and acidic juices such as orange juice.  NO MINT OR MENTHOL PRODUCTS SO NO COUGH DROPS  USE SUGARLESS CANDY INSTEAD (jolley ranchers or Stover's)  NO OIL BASED VITAMINS - use powdered substitutes. ' For cough use the narco up to every 4 hours as needed  Please remember to go to the  x-ray department downstairs for your tests - we will call you with the results when they are available.  Please schedule a follow up office visit in 2 weeks, sooner if needed for Dr Craige Cotta or Babette Relic our NP   Late add: note did not go to cxr as requested

## 2013-10-13 DIAGNOSIS — R059 Cough, unspecified: Secondary | ICD-10-CM | POA: Insufficient documentation

## 2013-10-13 DIAGNOSIS — R05 Cough: Secondary | ICD-10-CM | POA: Insufficient documentation

## 2013-10-13 NOTE — Assessment & Plan Note (Signed)
Persistent cough x > one month while on maint rx with asthmanex  Adherence is always the initial "prime suspect" and is a multilayered concern that requires a "trust but verify" approach in every patient - starting with knowing how to use medications, especially inhalers, correctly, keeping up with refills and understanding the fundamental difference between maintenance and prns vs those medications only taken for a very short course and then stopped and not refilled.   ? Acid (or non-acid) GERD > always difficult to exclude as up to 75% of pts in some series report no assoc GI/ Heartburn symptoms> rec continue  max (24h)  acid suppression and diet restrictions/ reviewed    ? Active sinus dz > augmentin x 10 days then sinus ct if needed  ? Adverse effects of dpi > leave off asthmanex x 2 weeks then return to regroup re maint rx    Each maintenance medication was reviewed in detail including most importantly the difference between maintenance and as needed and under what circumstances the prns are to be used.  Please see instructions for details which were reviewed in writing and the patient given a copy.

## 2013-10-13 NOTE — Assessment & Plan Note (Signed)
Doubt active sarcoid  cxr ordered but not done

## 2013-10-16 ENCOUNTER — Telehealth: Payer: Self-pay | Admitting: Internal Medicine

## 2013-10-16 MED ORDER — FLUCONAZOLE 100 MG PO TABS: 100.0000 mg | ORAL_TABLET | Freq: Every day | ORAL | Status: DC

## 2013-10-16 NOTE — Telephone Encounter (Signed)
Diflucan 100 mg one qd x 3 days

## 2013-10-16 NOTE — Telephone Encounter (Signed)
Last visit on 10-11-13 and was prescribed abx. Pt states that she now has a yeast infections from ABX and is asking for an rx for diflucan #3 tablets. Please advise. Carron Curie, CMA [facility] [address]

## 2013-10-16 NOTE — Telephone Encounter (Signed)
Cynthia Kirk - Questionnaire Submission ','<More Detail >>          Questionnaire Submission  Cynthia Kirk  MRN: [id number] DOB: 05/22/1951     Pt Home: [phone]     Sent: Mon October 15, 2013 8:12 PM   Entered: [phone]                           Current View: Showing all answers Show Only Relevant Answers    Legend: Scores, Non-relevant Questions     Patient Responses     Chl Mychart After Visit Questionnaire    Question 10/15/2013 8:12 PM   How are you feeling after your recent visit? Some what better   Does the recommended course of treatment seem to be helping your symptoms? Yes   Are you experiencing any side effects from your recommended treatment? Yeast from the ntibotics   is there anything else you would like to ask your physician? I need 3 difluicon tablets         Message    Patient Questionnaire Submission   --------------------------------      Questionnaire: Questionnaire      Question: How are you feeling after your recent visit?   Answer: Some what better      Question: Does the recommended course of treatment seem to be helping your symptoms?   Answer: Yes      Question: Are you experiencing any side effects from your recommended treatment?   Answer: Yeast from the ntibotics      Question: is there anything else you would like to ask your physician?   Answer: I need 3 difluicon tablets      ----- Message -----   From: Sandrea Hughs, MD   Sent: 10/13/2013 2:34 PM   To: Cynthia Kirk   Subject: Questionnaire       To ensure we are providing you the highest quality healthcare, we'd like to know how you are feeling after your recent visit. At your earliest convenience, please complete the brief follow-up assessment by clicking the Task: Questionnaire link listed above.      Thank you for your time in helping [place] improve our services and for partnering with [place] in your wellness and care.      Sincerely,

## 2013-10-16 NOTE — Telephone Encounter (Signed)
Pt advised that [facility] [address]

## 2013-10-23 DIAGNOSIS — Z09 Encounter for follow-up examination after completed treatment for conditions other than malignant neoplasm: Secondary | ICD-10-CM | POA: Diagnosis not present

## 2013-10-23 DIAGNOSIS — M549 Dorsalgia, unspecified: Secondary | ICD-10-CM | POA: Diagnosis not present

## 2013-10-24 DIAGNOSIS — M545 Low back pain, unspecified: Secondary | ICD-10-CM | POA: Diagnosis not present

## 2013-10-24 DIAGNOSIS — M79609 Pain in unspecified limb: Secondary | ICD-10-CM | POA: Diagnosis not present

## 2013-10-24 DIAGNOSIS — IMO0001 Reserved for inherently not codable concepts without codable children: Secondary | ICD-10-CM | POA: Diagnosis not present

## 2013-10-24 DIAGNOSIS — Z4789 Encounter for other orthopedic aftercare: Secondary | ICD-10-CM | POA: Diagnosis not present

## 2013-10-25 ENCOUNTER — Ambulatory Visit (INDEPENDENT_AMBULATORY_CARE_PROVIDER_SITE_OTHER): Payer: Medicare Other | Admitting: Adult Health

## 2013-10-25 ENCOUNTER — Encounter: Payer: Self-pay | Admitting: Adult Health

## 2013-10-25 ENCOUNTER — Ambulatory Visit (INDEPENDENT_AMBULATORY_CARE_PROVIDER_SITE_OTHER)
Admission: RE | Admit: 2013-10-25 | Discharge: 2013-10-25 | Disposition: A | Discharge status: Home / Self Care | Payer: Medicare Other | Source: Ambulatory Visit | Attending: Internal Medicine | Admitting: Internal Medicine

## 2013-10-25 VITALS — BP 128/66 | HR 71 | Temp 98.4°F

## 2013-10-25 DIAGNOSIS — IMO0002 Reserved for concepts with insufficient information to code with codable children: Secondary | ICD-10-CM | POA: Diagnosis not present

## 2013-10-25 DIAGNOSIS — D869 Sarcoidosis, unspecified: Secondary | ICD-10-CM | POA: Diagnosis not present

## 2013-10-25 DIAGNOSIS — J45909 Unspecified asthma, uncomplicated: Secondary | ICD-10-CM

## 2013-10-25 DIAGNOSIS — J99 Respiratory disorders in diseases classified elsewhere: Secondary | ICD-10-CM

## 2013-10-25 DIAGNOSIS — J984 Other disorders of lung: Secondary | ICD-10-CM | POA: Diagnosis not present

## 2013-10-25 DIAGNOSIS — M1710 Unilateral primary osteoarthritis, unspecified knee: Secondary | ICD-10-CM | POA: Diagnosis not present

## 2013-10-25 DIAGNOSIS — J4530 Mild persistent asthma, uncomplicated: Secondary | ICD-10-CM

## 2013-10-25 MED ORDER — HYDROCOD POLST-CHLORPHEN POLST 10-8 MG/5ML PO LQCR: 5.0000 mL | Freq: Two times a day (BID) | ORAL | Status: DC | PRN

## 2013-10-25 NOTE — Assessment & Plan Note (Signed)
Recent flare with bronchitis slow to resolve  cxr with no acute process.  Will hold off on additional abx as hx of c. Diff.  Have her follow up with ENT .   Plan  Restart Asmanex .  Use Mucinex DM Twice daily  As needed  Cough/congestion  Saline nasal rinses As needed   Follow up with ENT regarding sinus issues.  Tussionex 1 tsp Twice daily  As needed  Cough, may not be used with Norco .  Follow up Dr. Craige Cotta  6- 8 weeks  As needed   Please contact office for sooner follow up if symptoms do not improve or worsen or seek emergency care

## 2013-10-25 NOTE — Progress Notes (Signed)
History of Present Illness: Cynthia Kirk is a 62 y.o. female with Asthma, Sarcoidosis, OSA, Rhinitis, and Hoarseness.   10/11/2013  Acute  ov/Wert re: sob/cough on asthmanex Chief Complaint  Patient presents with  . Acute Visit    Increased SOB, bodyaches w/ cough and mucus green, yellow and red in color    Sick since Oct 2nd, onset was abrupt "like a head cold that went into my chest" and sob with activity but not at rest, not usually requiring neb but has needed it since onset with some benefit. >>augmentin x 10 , steroid taper, cxr with chronic changes  10/25/2013 Follow up  2 week follow up for asthmatic bronchitis. Accompanied by husband.  Reports cough is unchanged since last ov.  still prodcough w/ green, yellow and spots of BRB.  finished Augmentin and pred taper. Had chest xray with no acute finding . Says cough /congestion are better but still has discolored with some specks of blodd. Has sinus congestion and drainage . Not using any pain meds or cough syrup . No fever, chest pain , edema.  Is planning to see ENT soon regarding sinus issues.  Finished Augmentin and steroid taper. Does have hx of  C. Diff .  No rash , joint swelling.     Current Medications, Allergies, Complete Past Medical History, Past Surgical History, Family History, and Social History were reviewed in [employer] record.  ROS  The following are not active complaints unless bolded sore throat, dysphagia, dental problems, itching, sneezing,  nasal congestion or excess/ purulent secretions, ear ache,   fever, chills, sweats, unintended wt loss, pleuritic or exertional cp, hemoptysis,  orthopnea pnd or leg swelling, presyncope, palpitations, heartburn, abdominal pain, anorexia, nausea, vomiting, diarrhea  or change in bowel or urinary habits, change in stools or urine, dysuria,hematuria,  rash, arthralgias, visual complaints, headache, numbness weakness or ataxia or problems with walking or  coordination,  change in mood/affect or memory.        TESTS: Spirometry 06/30/09 >> FEV1 1.69(87%), FVC 2.17(83%), FEV1% 78   EXAM :  GEN: A/Ox3; pleasant , NAD, obese   HEENT:  Orient/AT,  EACs-clear, TMs-wnl, NOSE-clear drainage , max tenderness  THROAT-clear, no lesions, no postnasal drip or exudate noted.   NECK:  Supple w/ fair ROM; no JVD; normal carotid impulses w/o bruits; no thyromegaly or nodules palpated; no lymphadenopathy.  RESP  Clear  P & A; w/o, wheezes/ rales/ or rhonchi.no accessory muscle use, no dullness to percussion  CARD:  RRR, no m/r/g  , no peripheral edema, pulses intact, no cyanosis or clubbing.  GI:   Soft & nt; nml bowel sounds; no organomegaly or masses detected.  Musco: Warm bil, no deformities or joint swelling noted.   Neuro: alert, no focal deficits noted.    Skin: Warm, no lesions or rashes     cxr 10/11/13   Stable bilateral upper lobe scarring. No acute process.

## 2013-10-25 NOTE — Progress Notes (Signed)
Quick Note:  Spoke with pt and notified of results per Dr. Wert. Pt verbalized understanding and denied any questions.  ______ 

## 2013-10-25 NOTE — Patient Instructions (Addendum)
Restart Asmanex .  Use Mucinex DM Twice daily  As needed  Cough/congestion  Saline nasal rinses As needed   Follow up with ENT regarding sinus issues.  Tussionex 1 tsp Twice daily  As needed  Cough, may not be used with Norco .  Follow up Dr. Craige Cotta  6- 8 weeks  As needed   Please contact office for sooner follow up if symptoms do not improve or worsen or seek emergency care

## 2013-10-26 NOTE — Progress Notes (Signed)
Reviewed and agree with assessment/plan. 

## 2013-12-03 ENCOUNTER — Other Ambulatory Visit: Payer: Self-pay | Admitting: Pulmonary Disease

## 2013-12-03 ENCOUNTER — Other Ambulatory Visit: Payer: Self-pay | Admitting: Internal Medicine

## 2013-12-11 DIAGNOSIS — B372 Candidiasis of skin and nail: Secondary | ICD-10-CM | POA: Diagnosis not present

## 2013-12-19 ENCOUNTER — Ambulatory Visit: Payer: Medicare Other | Admitting: Pulmonary Disease

## 2013-12-20 ENCOUNTER — Other Ambulatory Visit: Payer: Self-pay | Admitting: Pulmonary Disease

## 2013-12-20 ENCOUNTER — Other Ambulatory Visit: Payer: Self-pay | Admitting: Internal Medicine

## 2013-12-25 DIAGNOSIS — M549 Dorsalgia, unspecified: Secondary | ICD-10-CM | POA: Diagnosis not present

## 2013-12-25 DIAGNOSIS — E669 Obesity, unspecified: Secondary | ICD-10-CM | POA: Diagnosis not present

## 2013-12-28 ENCOUNTER — Telehealth: Payer: Self-pay | Admitting: Pulmonary Disease

## 2013-12-28 DIAGNOSIS — G4733 Obstructive sleep apnea (adult) (pediatric): Secondary | ICD-10-CM

## 2013-12-28 MED ORDER — BECLOMETHASONE DIPROPIONATE 80 MCG/ACT IN AERS: 2.0000 | INHALATION_SPRAY | Freq: Two times a day (BID) | RESPIRATORY_TRACT | Status: DC

## 2013-12-28 NOTE — Telephone Encounter (Signed)
Rx has been sent in. 

## 2013-12-28 NOTE — Telephone Encounter (Signed)
Please inform pt that I have sent order to have her DME arrange for new CPAP machine.

## 2013-12-28 NOTE — Telephone Encounter (Signed)
Called and spoke with pt. She reports she has been having problems with her CPAP. It cuts off in the middle of the night. It takes it a while to restart back up. She reports she had DME look at and was told to call us to send an order to get her a new machine. Per pt she has already had her machine x 5 years. Pt has pending appt 01/18/14. Please advise VS thanks

## 2013-12-28 NOTE — Telephone Encounter (Signed)
Pt is aware that we have sent this order to her DME.

## 2013-12-28 NOTE — Telephone Encounter (Signed)
Please send order for Qvar 80 mcg/puff, two puffs bid.  Dispense 3 months supply with 3 refills.

## 2013-12-28 NOTE — Telephone Encounter (Signed)
[facility], her insurance will not cover Asmanex. Formulary alternatives are Flovent HFA and Qvar. Please advise once inhaler to switch pt to. Thanks.

## 2013-12-30 ENCOUNTER — Other Ambulatory Visit: Payer: Self-pay | Admitting: Gastroenterology

## 2013-12-30 ENCOUNTER — Other Ambulatory Visit: Payer: Self-pay | Admitting: Internal Medicine

## 2013-12-31 ENCOUNTER — Other Ambulatory Visit: Payer: Self-pay | Admitting: Internal Medicine

## 2013-12-31 DIAGNOSIS — K219 Gastro-esophageal reflux disease without esophagitis: Secondary | ICD-10-CM | POA: Diagnosis not present

## 2013-12-31 DIAGNOSIS — R490 Dysphonia: Secondary | ICD-10-CM | POA: Diagnosis not present

## 2013-12-31 DIAGNOSIS — J329 Chronic sinusitis, unspecified: Secondary | ICD-10-CM | POA: Diagnosis not present

## 2013-12-31 NOTE — Telephone Encounter (Signed)
NEEDS OFFICE VISIT FOR ANY FURTHER REFILLS! 

## 2014-01-07 ENCOUNTER — Telehealth: Payer: Self-pay | Admitting: Pulmonary Disease

## 2014-01-07 NOTE — Telephone Encounter (Signed)
Spoke with pt, see other phone note from 01-07-14. [place] Bing, CMA

## 2014-01-07 NOTE — Telephone Encounter (Signed)
I spoke with Alric Quan from [facility] oxygen and she states the studies that they have are cpap titration studies, not the original sleep study. She states she has been trying to contact doctor from [place] but has not had any luck. She has spoken with the pt and advised what was needed but she felt like the pt was confused and asks that we speak with the pt. Per Alric Quan in order for the pt to get a new machine they have to have the interpretation from the pt sleep study. If they cannot locate this then the pt will need a sleep study with an OV prior stating her sleep issues. I advised I will try to figure out if we have sleep study and let her know.   I looked through pt chart and called [facility] at [phone] and was advised that what we have is all they have for this pt.  So I then called the pt to explain that we cannot locate her original sleep study which is what is needed for medicare to pay for her new machine. Pt stated understanding and is ok to proceed with sleep study order. I advised that she will need an appt with VS to discuss sleep issues and he has to order the study at that time. Pt has an appt on 01-18-14 with VS.   For Appt with VS on 01-18-14 he needs to document in the visit the sleep issues that the pt is having and that he is ordering a sleep study for this. [place] Bing, CMA

## 2014-01-07 NOTE — Telephone Encounter (Signed)
Called [facility]MT[facility] x1 for tachia at ext: [phone]

## 2014-01-09 NOTE — Telephone Encounter (Signed)
I spoke with Alric Quan and she just wanted to clarify what is needed to be documented in the pt OV note on 01-18-14. She states medicare guidelines state it needs to say she is using and benefiting from her cpap and that we are sending her for a new study because we cannot locate her original. I will forward this to VS so he is aware of what needs to be in note. I have also palced a note on the appt so nurse is aware. [place] Bing, CMA

## 2014-01-09 NOTE — Telephone Encounter (Signed)
LMTCBx2 with Tachia. [place] Bing, CMA

## 2014-01-09 NOTE — Telephone Encounter (Signed)
Cynthia Kirk returning call

## 2014-01-16 ENCOUNTER — Ambulatory Visit: Payer: Medicare Other | Admitting: Gastroenterology

## 2014-01-18 ENCOUNTER — Ambulatory Visit: Payer: Medicare Other | Admitting: Pulmonary Disease

## 2014-01-24 ENCOUNTER — Other Ambulatory Visit: Payer: Self-pay | Admitting: Gastroenterology

## 2014-01-24 ENCOUNTER — Other Ambulatory Visit: Payer: Self-pay | Admitting: Internal Medicine

## 2014-01-24 NOTE — Telephone Encounter (Signed)
NEEDS OFFICE VISIT FOR ANY FURTHER REFILLS! 

## 2014-01-26 ENCOUNTER — Other Ambulatory Visit: Payer: Self-pay | Admitting: Gastroenterology

## 2014-01-31 ENCOUNTER — Ambulatory Visit: Payer: Medicare Other | Admitting: Pulmonary Disease

## 2014-02-01 ENCOUNTER — Ambulatory Visit: Payer: Medicare Other | Admitting: Pulmonary Disease

## 2014-02-01 ENCOUNTER — Ambulatory Visit: Payer: Medicare Other | Admitting: Internal Medicine

## 2014-02-04 DIAGNOSIS — N950 Postmenopausal bleeding: Secondary | ICD-10-CM | POA: Diagnosis not present

## 2014-02-04 DIAGNOSIS — R87610 Atypical squamous cells of undetermined significance on cytologic smear of cervix (ASC-US): Secondary | ICD-10-CM | POA: Diagnosis not present

## 2014-02-04 DIAGNOSIS — R6889 Other general symptoms and signs: Secondary | ICD-10-CM | POA: Diagnosis not present

## 2014-02-05 ENCOUNTER — Ambulatory Visit: Payer: Medicare Other | Admitting: Pulmonary Disease

## 2014-02-06 ENCOUNTER — Ambulatory Visit: Payer: Medicare Other | Admitting: Internal Medicine

## 2014-02-06 ENCOUNTER — Ambulatory Visit (INDEPENDENT_AMBULATORY_CARE_PROVIDER_SITE_OTHER): Payer: Medicare Other | Admitting: Gastroenterology

## 2014-02-06 ENCOUNTER — Encounter: Payer: Self-pay | Admitting: Gastroenterology

## 2014-02-06 ENCOUNTER — Other Ambulatory Visit: Payer: Self-pay | Admitting: Internal Medicine

## 2014-02-06 ENCOUNTER — Ambulatory Visit: Payer: Medicare Other | Admitting: Gastroenterology

## 2014-02-06 VITALS — BP 136/78 | HR 66 | Ht 64.0 in | Wt 252.0 lb

## 2014-02-06 DIAGNOSIS — R490 Dysphonia: Secondary | ICD-10-CM

## 2014-02-06 DIAGNOSIS — K219 Gastro-esophageal reflux disease without esophagitis: Secondary | ICD-10-CM | POA: Diagnosis not present

## 2014-02-06 MED ORDER — RANITIDINE HCL 300 MG PO TABS: ORAL_TABLET | ORAL | Status: DC

## 2014-02-06 MED ORDER — DEXLANSOPRAZOLE 60 MG PO CPDR: DELAYED_RELEASE_CAPSULE | ORAL | Status: DC

## 2014-02-06 NOTE — Patient Instructions (Signed)
We have sent the following medications to your pharmacy for you to pick up at your convenience: Dexilant and ranitidine.   Thank you for choosing me and [facility].  Pricilla Riffle. Dagoberto Ligas., MD., Marval Regal

## 2014-02-06 NOTE — Progress Notes (Signed)
    History of Present Illness: This is a 63 year old female with chronic GERD. She underwent upper endoscopy in March 2010 in [place] showing small hiatal hernia. Biopsies were taken near the Z line to rule out Barrett's and gastric antrum to rule out H. Pylori and they were normal. She underwent colonoscopy in October 2012 which showed mild diverticulosis but was otherwise normal. She relates occasional postprandial epigastric burning and occasional hoarseness. She has been evaluated by an ENT physician and is being referred [facility] or additional evaluation.  Current Medications, Allergies, Past Medical History, Past Surgical History, Family History and Social History were reviewed in [employer] record.  Physical Exam: General: Well developed , well nourished, obese, no acute distress Head: Normocephalic and atraumatic Eyes:  sclerae anicteric, EOMI Ears: Normal auditory acuity Mouth: No deformity or lesions Lungs: Clear throughout to auscultation Heart: Regular rate and rhythm; no murmurs, rubs or bruits Abdomen: Soft, non tender and non distended. No masses, hepatosplenomegaly or hernias noted. Normal Bowel sounds Musculoskeletal: Symmetrical with no gross deformities  Pulses:  Normal pulses noted Extremities: No clubbing, cyanosis, edema or deformities noted Neurological: Alert oriented x 4, grossly nonfocal Psychological:  Alert and cooperative. Normal mood and affect  Assessment and Recommendations:  1. GERD. Well controlled. Continue standard antireflux measures, dexlansoprazole 60 mg every morning and ranitidine 300 mg every afternoon. She underwent endoscopy at 2010 and do not feel his needs to be repeated. I am not convinced that her hoarseness is related to GERD and LPR. Further evaluation at [facility].

## 2014-02-08 DIAGNOSIS — M549 Dorsalgia, unspecified: Secondary | ICD-10-CM | POA: Diagnosis not present

## 2014-02-08 DIAGNOSIS — E669 Obesity, unspecified: Secondary | ICD-10-CM | POA: Diagnosis not present

## 2014-02-11 ENCOUNTER — Ambulatory Visit: Payer: Medicare Other | Admitting: Internal Medicine

## 2014-02-11 ENCOUNTER — Other Ambulatory Visit: Payer: Self-pay | Admitting: Neurological Surgery

## 2014-02-11 DIAGNOSIS — M549 Dorsalgia, unspecified: Secondary | ICD-10-CM

## 2014-02-13 ENCOUNTER — Ambulatory Visit: Payer: Medicare Other | Admitting: Internal Medicine

## 2014-02-18 ENCOUNTER — Other Ambulatory Visit: Payer: Self-pay

## 2014-02-18 DIAGNOSIS — Z1231 Encounter for screening mammogram for malignant neoplasm of breast: Secondary | ICD-10-CM

## 2014-02-19 ENCOUNTER — Ambulatory Visit
Admission: RE | Admit: 2014-02-19 | Discharge: 2014-02-19 | Disposition: A | Discharge status: Home / Self Care | Payer: Medicare Other | Source: Ambulatory Visit | Attending: Neurological Surgery | Admitting: Neurological Surgery

## 2014-02-19 DIAGNOSIS — M545 Low back pain, unspecified: Secondary | ICD-10-CM | POA: Diagnosis not present

## 2014-02-19 DIAGNOSIS — M549 Dorsalgia, unspecified: Secondary | ICD-10-CM

## 2014-02-24 ENCOUNTER — Other Ambulatory Visit: Payer: Self-pay | Admitting: Gastroenterology

## 2014-02-25 ENCOUNTER — Other Ambulatory Visit: Payer: Self-pay | Admitting: Internal Medicine

## 2014-02-25 DIAGNOSIS — H40009 Preglaucoma, unspecified, unspecified eye: Secondary | ICD-10-CM | POA: Diagnosis not present

## 2014-02-25 DIAGNOSIS — H5200 Hypermetropia, unspecified eye: Secondary | ICD-10-CM | POA: Diagnosis not present

## 2014-02-25 DIAGNOSIS — H269 Unspecified cataract: Secondary | ICD-10-CM | POA: Insufficient documentation

## 2014-02-25 DIAGNOSIS — H2589 Other age-related cataract: Secondary | ICD-10-CM | POA: Diagnosis not present

## 2014-02-25 DIAGNOSIS — H52209 Unspecified astigmatism, unspecified eye: Secondary | ICD-10-CM | POA: Diagnosis not present

## 2014-02-25 DIAGNOSIS — E119 Type 2 diabetes mellitus without complications: Secondary | ICD-10-CM | POA: Diagnosis not present

## 2014-02-25 DIAGNOSIS — H524 Presbyopia: Secondary | ICD-10-CM | POA: Diagnosis not present

## 2014-02-27 ENCOUNTER — Ambulatory Visit (INDEPENDENT_AMBULATORY_CARE_PROVIDER_SITE_OTHER): Payer: Medicare Other | Admitting: Pulmonary Disease

## 2014-02-27 ENCOUNTER — Ambulatory Visit (HOSPITAL_COMMUNITY)
Admission: RE | Admit: 2014-02-27 | Discharge: 2014-02-27 | Disposition: A | Discharge status: Home / Self Care | Payer: Medicare Other | Source: Ambulatory Visit | Attending: Pulmonary Disease | Admitting: Pulmonary Disease

## 2014-02-27 ENCOUNTER — Encounter: Payer: Self-pay | Admitting: Pulmonary Disease

## 2014-02-27 VITALS — BP 136/90 | HR 71 | Temp 98.4°F | Ht 64.0 in | Wt 253.0 lb

## 2014-02-27 DIAGNOSIS — D869 Sarcoidosis, unspecified: Secondary | ICD-10-CM | POA: Diagnosis not present

## 2014-02-27 DIAGNOSIS — R079 Chest pain, unspecified: Secondary | ICD-10-CM | POA: Diagnosis not present

## 2014-02-27 DIAGNOSIS — G4733 Obstructive sleep apnea (adult) (pediatric): Secondary | ICD-10-CM | POA: Diagnosis not present

## 2014-02-27 DIAGNOSIS — R0789 Other chest pain: Secondary | ICD-10-CM | POA: Diagnosis not present

## 2014-02-27 DIAGNOSIS — J99 Respiratory disorders in diseases classified elsewhere: Secondary | ICD-10-CM

## 2014-02-27 DIAGNOSIS — J45909 Unspecified asthma, uncomplicated: Secondary | ICD-10-CM | POA: Diagnosis not present

## 2014-02-27 DIAGNOSIS — D860 Sarcoidosis of lung: Secondary | ICD-10-CM

## 2014-02-27 DIAGNOSIS — R091 Pleurisy: Secondary | ICD-10-CM | POA: Diagnosis not present

## 2014-02-27 DIAGNOSIS — J984 Other disorders of lung: Secondary | ICD-10-CM | POA: Diagnosis not present

## 2014-02-27 DIAGNOSIS — J4530 Mild persistent asthma, uncomplicated: Secondary | ICD-10-CM

## 2014-02-27 NOTE — Progress Notes (Signed)
Chief Complaint  Patient presents with  . Follow-up    Breathing is unchanged. Pt c/o BIL rib pain through out day and worsens at night. Intermittent dry cough and SOB when walking up stairs. Denies CP    History of Present Illness: Cynthia Kirk is a 63 y.o. female with Asthma, Sarcoidosis, OSA, Rhinitis, and Hoarseness.  She has noticed more discomfort in her chest for the past 2 weeks.  She has been getting dry cough and some wheezing >> mostly at night.  She had fever up to 102F last week.  She has not been using asmanex.  She has been using albuterol daily for the past week.  TESTS: Spirometry 06/30/09 >> FEV1 1.69(87%), FVC 2.17(83%), FEV1% 78 CPAP 12/25/12 to 01/23/13 >> Used on 20 of 30 nights with average 3 hrs 51 min. Average AHI 0.8 with CPAP 9 cm H2O. Minimal airleak.  She  has a past medical history of Hiatal hernia; Morbid obesity (2010); Iron deficiency anemia, unspecified; Esophageal reflux; Allergic rhinitis, cause unspecified; Unspecified asthma(493.90); Sarcoidosis (1996); Unspecified menopausal and postmenopausal disorder; Intestinal infection due to Clostridium difficile; Iron deficiency anemia; Syncope; Ejection fraction; H/O menorrhagia (2011); Endometrial mass (2011); Urinary incontinence, mixed (2011); Simple endometrial hyperplasia (2011); Herpes simplex type 2 infection complicating pregnancy; ASCUS with positive high risk HPV; PMB (postmenopausal bleeding) (05/13/2010); H/O goiter; VIN II (vulvar intraepithelial neoplasia II); Fibroid; Anxiety; H/O Clostridium difficile infection (03/2010); Adenomatous colon polyp; Blood transfusion (1976); Abnormal Pap smear (2008); H/O varicella; History of measles, mumps, or rubella; Pain, pelvic, female (2010); TMJ (dislocation of temporomandibular joint); Obstructive sleep apnea (adult) (pediatric); Complication of anesthesia; Hypertension; Pneumonia (2005; 2007; 2009; 2011); Chronic bronchitis; Exertional shortness of breath; Type 2  diabetes, diet controlled; Spinal stenosis, lumbar; Migraine; Arthritis; Chronic back pain; Panic attacks; and Vulvar cancer.  She  has past surgical history that includes Vulvectomy (~ 2007); Ectopic pregnancy surgery (1976; ~ 1978); Urethral sling (2011); Dilation and curettage of uterus (2007); Posterior laminectomy / decompression lumbar spine (09/05/2013); Thoracic laminectomy (09/05/2013); Posterior lumbar fusion (09/05/2013); Hysteroscopy diagnostic (2011); Colposcopy vulva w/ biopsy; Maximum access (mas)posterior lumbar interbody fusion (plif) 1 level (N/A, 09/05/2013); and Lumbar laminectomy/decompression microdiscectomy (N/A, 09/05/2013).   Outpatient Encounter Prescriptions as of 02/27/2014  Medication Sig  . albuterol (PROVENTIL HFA;VENTOLIN HFA) 108 (90 BASE) MCG/ACT inhaler Inhale 2 puffs into the lungs 4 (four) times daily as needed for wheezing or shortness of breath.  [name] albuterol (PROVENTIL) (2.5 MG/3ML) 0.083% nebulizer solution Take 2.5 mg by nebulization every 6 (six) hours as needed for shortness of breath.  . ALPRAZolam (XANAX) 1 MG tablet Take 1 tablet (1 mg total) by mouth 2 (two) times daily as needed for sleep or anxiety.  [name] [name] HCT 40-25 MG per tablet TAKE 1 TABLET EVERY DAY  . BYSTOLIC 5 MG tablet TAKE 1 TABLET BY MOUTH DAILY  . Cholecalciferol (VITAMIN D) 2000 UNITS tablet Take 1,000 Units by mouth daily.   . cyanocobalamin (,VITAMIN B-12,) 1000 MCG/ML injection Inject 1,000 mcg into the muscle every 30 (thirty) days.  [name] DEXILANT 60 MG capsule TAKE ONE CAPSULE BY MOUTH EVERY DAY  . HYDROcodone-acetaminophen (NORCO) 10-325 MG per tablet Take 2 tablets by mouth every 4 (four) hours as needed for pain.  . medroxyPROGESTERone (PROVERA) 10 MG tablet Take 10 mg by mouth. Every 3 months.  . methocarbamol (ROBAXIN) 500 MG tablet Take 1 tablet (500 mg total) by mouth every 6 (six) hours as needed.  . montelukast (SINGULAIR) 10 MG tablet TAKE 1  TABLET (10 MG TOTAL) BY MOUTH AT  BEDTIME.  . Multiple Vitamin (MULTIVITAMIN WITH MINERALS) TABS tablet Take 1 tablet by mouth daily.  [name] nystatin cream (MYCOSTATIN) Apply topically as needed for dry skin.  . polyethylene glycol powder (GLYCOLAX/MIRALAX) powder TAKE 17 G BY MOUTH 2 (TWO) TIMES DAILY.  [name] PROAIR HFA 108 (90 BASE) MCG/ACT inhaler INHALE 2 PUFFS INTO THE LUNGS 4 TIMES DAILY  . ranitidine (ZANTAC) 300 MG tablet TAKE 1 TABLET (300 MG TOTAL) BY MOUTH AT BEDTIME.  [name] solifenacin (VESICARE) 10 MG tablet Take 10 mg by mouth daily. Total- 15mg  daily  . solifenacin (VESICARE) 5 MG tablet Take 5 mg by mouth daily.  . SYRINGE/NEEDLE, DISP, 1 ML (B-D SYRINGE/NEEDLE 1CC/25GX5/8) 25G X 5/8" 1 ML MISC Use 1 monthly to administer her b12 injection  . ASMANEX 60 METERED DOSES 220 MCG/INH inhaler INHALE 2 PUFFS INTO THE LUNGS AT BEDTIME  . [DISCONTINUED] ASMANEX 60 METERED DOSES 220 MCG/INH inhaler INHALE 2 PUFFS INTO THE LUNGS AT BEDTIME  . [DISCONTINUED] dexlansoprazole (DEXILANT) 60 MG capsule TAKE ONE CAPSULE BY MOUTH EVERY DAY  . [DISCONTINUED] ranitidine (ZANTAC) 300 MG tablet TAKE 1 TABLET (300 MG TOTAL) BY MOUTH AT BEDTIME.    Allergies  Allergen Reactions  . Aspirin     REACTION: hives  . Latex     REACTION: rash    Physical Exam:  General - No distress ENT - No sinus tenderness, no oral exudate, no LAN Cardiac - s1s2 regular, no murmur Chest - No wheeze/rales/dullness Back - No focal tenderness Abd - Soft, non-tender Ext - No edema Neuro - Normal strength Skin - No rashes Psych - normal mood, and behavior   Assessment/Plan:  Chesley Mires, MD [facility] Pager:  [phone] 02/27/2014, 11:54 AM

## 2014-02-27 NOTE — Patient Instructions (Signed)
Chest xray today >> will call with results Can try using motrin for next two three days as needed for chest discomfort Will arrange for sleep study Will call to arrange for follow up after sleep study reviewed

## 2014-02-28 ENCOUNTER — Encounter: Payer: Self-pay | Admitting: Pulmonary Disease

## 2014-02-28 DIAGNOSIS — R0789 Other chest pain: Secondary | ICD-10-CM | POA: Insufficient documentation

## 2014-02-28 NOTE — Assessment & Plan Note (Signed)
She likely had a viral illness, and is slowly recovering.  I don't think she needs antibiotics.  Will get chest xray to further assess.

## 2014-02-28 NOTE — Assessment & Plan Note (Signed)
She can continue singulair and prn albuterol.

## 2014-02-28 NOTE — Assessment & Plan Note (Signed)
She will need new sleep study for insurance purposes prior to getting new CPAP supplies.

## 2014-02-28 NOTE — Assessment & Plan Note (Signed)
This has not been an active issue.  Will assess status with repeat chest xray.

## 2014-03-04 ENCOUNTER — Other Ambulatory Visit (INDEPENDENT_AMBULATORY_CARE_PROVIDER_SITE_OTHER): Payer: Medicare Other

## 2014-03-04 ENCOUNTER — Encounter: Payer: Self-pay | Admitting: Internal Medicine

## 2014-03-04 ENCOUNTER — Ambulatory Visit (INDEPENDENT_AMBULATORY_CARE_PROVIDER_SITE_OTHER): Payer: Medicare Other | Admitting: Internal Medicine

## 2014-03-04 ENCOUNTER — Encounter: Payer: Self-pay | Admitting: *Deleted

## 2014-03-04 VITALS — BP 118/82 | HR 78 | Temp 98.5°F | Wt 248.8 lb

## 2014-03-04 DIAGNOSIS — I10 Essential (primary) hypertension: Secondary | ICD-10-CM | POA: Diagnosis not present

## 2014-03-04 DIAGNOSIS — E119 Type 2 diabetes mellitus without complications: Secondary | ICD-10-CM

## 2014-03-04 DIAGNOSIS — E538 Deficiency of other specified B group vitamins: Secondary | ICD-10-CM

## 2014-03-04 DIAGNOSIS — G4733 Obstructive sleep apnea (adult) (pediatric): Secondary | ICD-10-CM

## 2014-03-04 DIAGNOSIS — Z79899 Other long term (current) drug therapy: Secondary | ICD-10-CM | POA: Diagnosis not present

## 2014-03-04 LAB — HEMOGLOBIN A1C: Hgb A1c MFr Bld: 5.6 % (ref 4.6–6.5)

## 2014-03-04 MED ORDER — CYANOCOBALAMIN 1000 MCG/ML IJ SOLN: 1000.0000 ug | INTRAMUSCULAR | Status: DC

## 2014-03-04 MED ORDER — CYANOCOBALAMIN 1000 MCG/ML IJ SOLN
1000.0000 ug | Freq: Once | INTRAMUSCULAR | Status: AC
Start: 2014-03-04 — End: 2014-03-04
  Administered 2014-03-04: 1000 ug via INTRAMUSCULAR

## 2014-03-04 MED ORDER — ALPRAZOLAM 1 MG PO TABS: 1.0000 mg | ORAL_TABLET | Freq: Two times a day (BID) | ORAL | Status: DC | PRN

## 2014-03-04 NOTE — Progress Notes (Signed)
Pre visit review using our clinic review tool, if applicable. No additional management support is needed unless otherwise documented below in the visit note. 

## 2014-03-04 NOTE — Assessment & Plan Note (Signed)
BP Readings from Last 3 Encounters:  03/04/14 118/82  02/27/14 136/90  02/06/14 136/78   The current medical regimen is effective;  continue present plan and medications.

## 2014-03-04 NOTE — Patient Instructions (Signed)
It was good to see you today.  We have reviewed your prior records including labs and tests today  B12 shot given today -  Test(s) ordered today. Your results will be released to [place] (or called to you) after review, usually within 72hours after test completion. If any changes need to be made, you will be notified at that same time.  Medications reviewed and updated, no changes recommended at this time. Refill on medication(s) as discussed today.  Please schedule followup in 6 months for diabetes check, call sooner if problems.

## 2014-03-04 NOTE — Assessment & Plan Note (Signed)
Diet controlled - but weight gain noted check a1c now Lab Results  Component Value Date   HGBA1C 5.4 08/16/2013

## 2014-03-04 NOTE — Assessment & Plan Note (Signed)
IM replacement for years Prev rx'd for home admin, but ?not covered by insurance at [facility] here and reauthorize prn

## 2014-03-04 NOTE — Progress Notes (Signed)
Subjective:    Patient ID: Cynthia Kirk, female    DOB: [birth year], 63 y.o.   MRN: [id number]  HPI  Patient is here for follow up  Reviewed chronic medical issues and interval medical events  Past Medical History  Diagnosis Date  . Hiatal hernia   . Morbid obesity 2010  . Esophageal reflux   . Allergic rhinitis, cause unspecified   . Unspecified asthma(493.90)   . Sarcoidosis 1996    @ [employer]  . Iron deficiency anemia   . Syncope     ?? Syncope ??  . Ejection fraction     EF 45-50%, echo, October 27, 2011  . H/O menorrhagia 2011  . Endometrial mass 2011  . Urinary incontinence, mixed 2011  . Simple endometrial hyperplasia 2011  . Herpes simplex type 2 infection complicating pregnancy   . ASCUS with positive high risk HPV   . PMB (postmenopausal bleeding) 05/13/2010  . H/O goiter   . VIN II (vulvar intraepithelial neoplasia II)   . Fibroid   . Anxiety   . H/O Clostridium difficile infection 03/2010  . Adenomatous colon polyp   . Blood transfusion 1976    Due to ectopic pregnancy  . Abnormal Pap smear 2008  . H/O varicella   . History of measles, mumps, or rubella   . Pain, pelvic, female 2010  . TMJ (dislocation of temporomandibular joint)   . Obstructive sleep apnea (adult) (pediatric)     cpap  . Complication of anesthesia     difficult iv access, usually has a picc  . Hypertension     dr Asa Lente  . Pneumonia 2005; 2007; 2009; 2011  . Chronic bronchitis     "not in the last 2 yr; used to get it frequently" (09/05/2013)  . Type 2 diabetes, diet controlled   . Spinal stenosis, lumbar   . Migraine     "haven't had one in years now" (09/05/2013)  . Arthritis     "joints" (09/05/2013)  . Chronic back pain   . Panic attacks   . Vulvar cancer     Review of Systems     Objective:   Physical Exam  BP 118/82  Pulse 78  Temp(Src) 98.5 F (36.9 C) (Oral)  Wt 248 lb 12.8 oz (112.855 kg)  SpO2 96%  LMP 02/14/2014 Wt Readings from Last 3 Encounters:    03/04/14 248 lb 12.8 oz (112.855 kg)  02/27/14 253 lb (114.76 kg)  02/06/14 252 lb (114.306 kg)    Constitutional: She appears well-developed and well-nourished. No distress.  Neck: Normal range of motion. Neck supple. No JVD present. No thyromegaly present.  Cardiovascular: Normal rate, regular rhythm and normal heart sounds.  No murmur heard. No BLE edema. Pulmonary/Chest: Effort normal and breath sounds normal. No respiratory distress. She has no wheezes.  Psychiatric: She has a normal mood and affect. Her behavior is normal. Judgment and thought content normal.   Lab Results  Component Value Date   WBC 4.7 08/29/2013   HGB 11.4* 08/29/2013   HCT 34.9* 08/29/2013   PLT 215 08/29/2013   GLUCOSE 77 08/29/2013   CHOL 176 08/16/2013   TRIG 75.0 08/16/2013   HDL 47.50 08/16/2013   LDLCALC 114* 08/16/2013   ALT 9 03/20/2013   AST 19 03/20/2013   NA 135 08/29/2013   K 3.1* 08/29/2013   CL 99 08/29/2013   CREATININE 1.11* 08/29/2013   BUN 25* 08/29/2013   CO2 28 08/29/2013   TSH 0.96 01/29/2013  HGBA1C 5.4 08/16/2013    Dg Chest 2 View  02/27/2014   CLINICAL DATA:  History of sarcoidosis now with pleurisy symptoms and bilateral rib discomfort  EXAM: CHEST  2 VIEW  COMPARISON:  DG CHEST 2 VIEW dated 10/25/2013  FINDINGS: The lungs are well-expanded. The interstitial markings are increased predominantly in the upper lobes. These findings are more conspicuous today. There is mild retraction of the hilar structures superiorly. There is density lateral to the cardiac apex which may reflect a confluence of the anterior and posterior ribs and pulmonary vascularity. The cardiac silhouette is normal in size. The pulmonary vascularity is not engorged. There is no pleural effusion or pneumothorax. The trachea is midline. There is mild disc space narrowing at multiple thoracic levels.  IMPRESSION: The interstitial markings of the upper lung zones have increased slightly since the previous study likely reflecting  the patient's known sarcoidosis. There is no alveolar infiltrate. No cavitary nodule is demonstrated. There is no pleural effusion or pneumothorax.   Electronically Signed   By: David  Martinique   On: 02/27/2014 14:44       Assessment & Plan:   Problem List Items Addressed This Visit   B12 deficiency     IM replacement for years Prev rx'd for home admin, but ?not covered by insurance at local pharmacy Admin here and reauthorize prn    Diabetes mellitus type 2, diet-controlled      Diet controlled - but weight gain noted check a1c now Lab Results  Component Value Date   HGBA1C 5.4 08/16/2013      HYPERTENSION - Primary      BP Readings from Last 3 Encounters:  03/04/14 118/82  02/27/14 136/90  02/06/14 136/78   The current medical regimen is effective;  continue present plan and medications.     OBSTRUCTIVE SLEEP APNEA     On CPAP Working with dr Halford Chessman for same Interval hx reviewed

## 2014-03-04 NOTE — Assessment & Plan Note (Signed)
On CPAP Working with dr Halford Chessman for same Interval hx reviewed

## 2014-03-05 ENCOUNTER — Telehealth: Payer: Self-pay | Admitting: Internal Medicine

## 2014-03-05 DIAGNOSIS — R498 Other voice and resonance disorders: Secondary | ICD-10-CM | POA: Diagnosis not present

## 2014-03-05 DIAGNOSIS — IMO0001 Reserved for inherently not codable concepts without codable children: Secondary | ICD-10-CM | POA: Diagnosis not present

## 2014-03-05 NOTE — Telephone Encounter (Signed)
Relevant patient education assigned to patient using Emmi. ° °

## 2014-03-06 ENCOUNTER — Ambulatory Visit (HOSPITAL_BASED_OUTPATIENT_CLINIC_OR_DEPARTMENT_OTHER): Payer: Medicare Other | Attending: Pulmonary Disease | Admitting: Radiology

## 2014-03-06 VITALS — Ht 65.0 in | Wt 243.0 lb

## 2014-03-06 DIAGNOSIS — G4733 Obstructive sleep apnea (adult) (pediatric): Secondary | ICD-10-CM | POA: Insufficient documentation

## 2014-03-12 ENCOUNTER — Ambulatory Visit: Payer: Medicare Other

## 2014-03-12 ENCOUNTER — Telehealth: Payer: Self-pay | Admitting: Pulmonary Disease

## 2014-03-12 DIAGNOSIS — G4710 Hypersomnia, unspecified: Secondary | ICD-10-CM | POA: Diagnosis not present

## 2014-03-12 DIAGNOSIS — G4730 Sleep apnea, unspecified: Secondary | ICD-10-CM

## 2014-03-12 DIAGNOSIS — G4733 Obstructive sleep apnea (adult) (pediatric): Secondary | ICD-10-CM | POA: Diagnosis not present

## 2014-03-12 NOTE — Telephone Encounter (Signed)
PSG 03/06/14 >> AHI 1.9, REM AHI 26.7, SaO2 low 80%.  Will have my nurse schedule ROV to review results.

## 2014-03-12 NOTE — Telephone Encounter (Signed)
ROV has been scheduled for 04/01/14 at 11:30am. Nothing further was needed.

## 2014-03-12 NOTE — Sleep Study (Signed)
[place]  NAME: Cynthia Kirk DATE OF BIRTH:  11-23-1951 MEDICAL RECORD NUMBER [id number]  LOCATION: [facility]  PHYSICIAN: Chesley Mires, M.D. DATE OF STUDY: 03/06/2014  SLEEP STUDY TYPE: Nocturnal Polysomnogram               REFERRING PHYSICIAN: Chesley Mires, MD  INDICATION FOR STUDY:  Cynthia Kirk is a 63 y.o. female who presents to the sleep lab for evaluation of hypersomnia with obstructive sleep apnea.  She reports snoring, sleep disruption, and daytime sleepiness.  She requires repeat sleep study prior to getting new CPAP equipment.  She had sleep study while living in [place] which showed an RDI of 10.5.  EPWORTH SLEEPINESS SCORE: 17. HEIGHT: 5\' 5"  (165.1 cm)  WEIGHT: 110.224 kg (243 lb)    Body mass index is 40.44 kg/(m^2).  NECK SIZE: 15 in.  MEDICATIONS:  Current Outpatient Prescriptions on File Prior to Visit  Medication Sig Dispense Refill  . albuterol (PROVENTIL HFA;VENTOLIN HFA) 108 (90 BASE) MCG/ACT inhaler Inhale 2 puffs into the lungs 4 (four) times daily as needed for wheezing or shortness of breath.      [name] albuterol (PROVENTIL) (2.5 MG/3ML) 0.083% nebulizer solution Take 2.5 mg by nebulization every 6 (six) hours as needed for shortness of breath.      . ALPRAZolam (XANAX) 1 MG tablet Take 1 tablet (1 mg total) by mouth 2 (two) times daily as needed for sleep or anxiety.  60 tablet  2  . [name] HCT 40-25 MG per tablet TAKE 1 TABLET EVERY DAY  30 tablet  5  . BYSTOLIC 5 MG tablet TAKE 1 TABLET BY MOUTH DAILY  30 tablet  5  . Cholecalciferol (VITAMIN D) 2000 UNITS tablet Take 1,000 Units by mouth daily.       . cyanocobalamin (,VITAMIN B-12,) 1000 MCG/ML injection Inject 1 mL (1,000 mcg total) into the muscle every 30 (thirty) days.  10 mL  1  . DEXILANT 60 MG capsule TAKE ONE CAPSULE BY MOUTH EVERY DAY  30 capsule  11  . medroxyPROGESTERone (PROVERA) 10 MG tablet Take 10 mg by mouth. Every 3 months.      . methocarbamol  (ROBAXIN) 500 MG tablet Take 1 tablet (500 mg total) by mouth every 6 (six) hours as needed.  60 tablet  1  . montelukast (SINGULAIR) 10 MG tablet TAKE 1 TABLET (10 MG TOTAL) BY MOUTH AT BEDTIME.  30 tablet  2  . Multiple Vitamin (MULTIVITAMIN WITH MINERALS) TABS tablet Take 1 tablet by mouth daily.      [name] nystatin cream (MYCOSTATIN) Apply topically as needed for dry skin.  60 g  0  . polyethylene glycol powder (GLYCOLAX/MIRALAX) powder TAKE 17 G BY MOUTH 2 (TWO) TIMES DAILY.  527 g  11  . ranitidine (ZANTAC) 300 MG tablet TAKE 1 TABLET (300 MG TOTAL) BY MOUTH AT BEDTIME.  30 tablet  11  . solifenacin (VESICARE) 10 MG tablet Take 10 mg by mouth daily. Total- 15mg  daily      . solifenacin (VESICARE) 5 MG tablet Take 5 mg by mouth daily.      . SYRINGE/NEEDLE, DISP, 1 ML (B-D SYRINGE/NEEDLE 1CC/25GX5/8) 25G X 5/8" 1 ML MISC Use 1 monthly to administer her b12 injection  12 each  0   No current facility-administered medications on file prior to visit.    SLEEP ARCHITECTURE:  Total recording time: 407.5 minutes.  Total sleep time was: 309.5 minutes.  Sleep efficiency:  76%.  Sleep latency: 51 minutes.  REM latency: 294.5 minutes.  Stage N1: 13.4%.  Stage N2: 85.1%.  Stage N3: 0%.  Stage R:  1.5%.  Supine sleep: 189 minutes.  Non-supine sleep: 120.5 minutes.  RESPIRATORY DATA: Average respiratory rate: 24. Snoring: moderate. Average AHI: 1.9.  RDI: 2.5.   Apnea index: 0.4.  Hypopnea index: 1.6. Obstructive apnea index: 0.4.  Central apnea index: 0.  Mixed apnea index: 0. REM AHI: 26.7.  NREM AHI: 1.6. Supine AHI: 2.3. Non-supine AHI: 0.  OXYGEN DATA:  Baseline oxygenation: 99%. Lowest SaO2: 80%. Time spent below SaO2 90%: 5.3 minutes. Supplemental oxygen used: None.  CARDIAC DATA:  Average heart rate: 73 beats per minute. Rhythm strip: sinus rhythm.  MOVEMENT/PARASOMNIA:  Periodic limb movement: 0.8.  Period limb movements with arousals: 0.4. Restroom trips: One  IMPRESSION/  RECOMMENDATION:   This study shows REM related obstructive sleep apnea with an AHI during REM of 26.7 and SaO2 low of 80%.  Additional therapies include weight loss, CPAP, oral appliance, or surgical evaluation.   Chesley Mires, M.D. Diplomate, [employer] of Sleep Medicine  ELECTRONICALLY SIGNED ON:  03/12/2014, 12:18 AM [place] PH: [phone]   FX: [phone] [place]

## 2014-03-15 ENCOUNTER — Telehealth: Payer: Self-pay | Admitting: Pulmonary Disease

## 2014-03-15 NOTE — Telephone Encounter (Signed)
Dg Chest 2 View  02/27/2014   CLINICAL DATA:  History of sarcoidosis now with pleurisy symptoms and bilateral rib discomfort  EXAM: CHEST  2 VIEW  COMPARISON:  DG CHEST 2 VIEW dated 10/25/2013  FINDINGS: The lungs are well-expanded. The interstitial markings are increased predominantly in the upper lobes. These findings are more conspicuous today. There is mild retraction of the hilar structures superiorly. There is density lateral to the cardiac apex which may reflect a confluence of the anterior and posterior ribs and pulmonary vascularity. The cardiac silhouette is normal in size. The pulmonary vascularity is not engorged. There is no pleural effusion or pneumothorax. The trachea is midline. There is mild disc space narrowing at multiple thoracic levels.  IMPRESSION: The interstitial markings of the upper lung zones have increased slightly since the previous study likely reflecting the patient's known sarcoidosis. There is no alveolar infiltrate. No cavitary nodule is demonstrated. There is no pleural effusion or pneumothorax.   Electronically Signed   By: David  Martinique   On: 02/27/2014 14:44   Will have my nurse inform pt that CXR shows stable changes of sarcoidosis.  No change to current treatment plan.

## 2014-03-15 NOTE — Telephone Encounter (Signed)
Pt is aware of results. Nothing further was needed. 

## 2014-03-21 DIAGNOSIS — N950 Postmenopausal bleeding: Secondary | ICD-10-CM | POA: Diagnosis not present

## 2014-03-21 DIAGNOSIS — B3731 Acute candidiasis of vulva and vagina: Secondary | ICD-10-CM | POA: Diagnosis not present

## 2014-03-21 DIAGNOSIS — Z01419 Encounter for gynecological examination (general) (routine) without abnormal findings: Secondary | ICD-10-CM | POA: Diagnosis not present

## 2014-03-21 DIAGNOSIS — B373 Candidiasis of vulva and vagina: Secondary | ICD-10-CM | POA: Diagnosis not present

## 2014-03-26 ENCOUNTER — Ambulatory Visit: Payer: Medicare Other

## 2014-03-29 ENCOUNTER — Encounter: Payer: Self-pay | Admitting: Internal Medicine

## 2014-04-01 ENCOUNTER — Ambulatory Visit
Admission: RE | Admit: 2014-04-01 | Discharge: 2014-04-01 | Disposition: A | Discharge status: Home / Self Care | Payer: Medicare Other | Source: Ambulatory Visit

## 2014-04-01 ENCOUNTER — Encounter: Payer: Self-pay | Admitting: Pulmonary Disease

## 2014-04-01 ENCOUNTER — Ambulatory Visit (INDEPENDENT_AMBULATORY_CARE_PROVIDER_SITE_OTHER): Payer: Medicare Other | Admitting: Pulmonary Disease

## 2014-04-01 VITALS — BP 122/82 | HR 70 | Ht 64.0 in | Wt 252.0 lb

## 2014-04-01 DIAGNOSIS — J4530 Mild persistent asthma, uncomplicated: Secondary | ICD-10-CM

## 2014-04-01 DIAGNOSIS — G4733 Obstructive sleep apnea (adult) (pediatric): Secondary | ICD-10-CM

## 2014-04-01 DIAGNOSIS — R058 Other specified cough: Secondary | ICD-10-CM

## 2014-04-01 DIAGNOSIS — R05 Cough: Secondary | ICD-10-CM | POA: Diagnosis not present

## 2014-04-01 DIAGNOSIS — Z1231 Encounter for screening mammogram for malignant neoplasm of breast: Secondary | ICD-10-CM

## 2014-04-01 DIAGNOSIS — R059 Cough, unspecified: Secondary | ICD-10-CM | POA: Diagnosis not present

## 2014-04-01 DIAGNOSIS — R0789 Other chest pain: Secondary | ICD-10-CM

## 2014-04-01 DIAGNOSIS — D869 Sarcoidosis, unspecified: Secondary | ICD-10-CM | POA: Diagnosis not present

## 2014-04-01 DIAGNOSIS — J99 Respiratory disorders in diseases classified elsewhere: Secondary | ICD-10-CM

## 2014-04-01 DIAGNOSIS — J45909 Unspecified asthma, uncomplicated: Secondary | ICD-10-CM | POA: Diagnosis not present

## 2014-04-01 DIAGNOSIS — D860 Sarcoidosis of lung: Secondary | ICD-10-CM

## 2014-04-01 MED ORDER — TRIAMCINOLONE ACETONIDE 55 MCG/ACT NA AERO: 2.0000 | INHALATION_SPRAY | Freq: Every day | NASAL | Status: DC

## 2014-04-01 NOTE — Assessment & Plan Note (Signed)
She can continue singulair and prn albuterol. 

## 2014-04-01 NOTE — Assessment & Plan Note (Signed)
Advised her to continue with nasal irrigation and try OTC nasacort.  Reviewed proper technique for using nasal sprays.

## 2014-04-01 NOTE — Assessment & Plan Note (Signed)
She has REM related sleep apnea.  Will have her DME arrange for new CPAP set up.  Explained that she may need additional sleep study.

## 2014-04-01 NOTE — Assessment & Plan Note (Signed)
Improved since last visit >> likely related to viral URI.

## 2014-04-01 NOTE — Progress Notes (Signed)
Chief Complaint  Patient presents with  . Sleep Apnea    review sleep study results    History of Present Illness: Cynthia Kirk is a 63 y.o. female with Asthma, Sarcoidosis, OSA, Rhinitis, and Hoarseness.  She is here to review her sleep study.  This showed REM related sleep apnea.  She is using a loaner CPAP machine.  Her husband has told her that her snoring is terrible, and she gasps if she does not use CPAP.  She is getting sinus congestin with post-nasal drip.  This causes a cough in the morning.  She is not having wheeze or fever.  Her chest discomfort has improved.  TESTS: Spirometry 06/30/09 >> FEV1 1.69(87%), FVC 2.17(83%), FEV1% 78 CPAP 12/25/12 to 01/23/13 >> Used on 20 of 30 nights with average 3 hrs 51 min. Average AHI 0.8 with CPAP 9 cm H2O. Minimal airleak. PSG 03/06/14 >> AHI 1.9, REM AHI 26.7, SaO2 low 80%.  She  has a past medical history of Hiatal hernia; Morbid obesity (2010); Esophageal reflux; Allergic rhinitis, cause unspecified; Unspecified asthma(493.90); Sarcoidosis (1996); Iron deficiency anemia; Syncope; Ejection fraction; H/O menorrhagia (2011); Endometrial mass (2011); Urinary incontinence, mixed (2011); Simple endometrial hyperplasia (2011); Herpes simplex type 2 infection complicating pregnancy; ASCUS with positive high risk HPV; VIN II (vulvar intraepithelial neoplasia II); Fibroid; Anxiety; H/O Clostridium difficile infection (03/2010); Adenomatous colon polyp; Blood transfusion (1976); Abnormal Pap smear (2008); Obstructive sleep apnea (adult) (pediatric); Pneumonia (2005; 2007; 2009; 2011); Type 2 diabetes, diet controlled; Spinal stenosis, lumbar; Chronic back pain; and Panic attacks.  She  has past surgical history that includes Vulvectomy (~ 2007); Ectopic pregnancy surgery (1976; ~ 1978); Urethral sling (2011); Dilation and curettage of uterus (2007); Posterior laminectomy / decompression lumbar spine (09/05/2013); Thoracic laminectomy (09/05/2013); Posterior  lumbar fusion (09/05/2013); Hysteroscopy diagnostic (2011); Colposcopy vulva w/ biopsy; Maximum access (mas)posterior lumbar interbody fusion (plif) 1 level (N/A, 09/05/2013); and Lumbar laminectomy/decompression microdiscectomy (N/A, 09/05/2013).   Outpatient Encounter Prescriptions as of 04/01/2014  Medication Sig  . albuterol (PROVENTIL HFA;VENTOLIN HFA) 108 (90 BASE) MCG/ACT inhaler Inhale 2 puffs into the lungs 4 (four) times daily as needed for wheezing or shortness of breath.  [name] albuterol (PROVENTIL) (2.5 MG/3ML) 0.083% nebulizer solution Take 2.5 mg by nebulization every 6 (six) hours as needed for shortness of breath.  . ALPRAZolam (XANAX) 1 MG tablet Take 1 tablet (1 mg total) by mouth 2 (two) times daily as needed for sleep or anxiety.  [name] [name] HCT 40-25 MG per tablet TAKE 1 TABLET EVERY DAY  . BYSTOLIC 5 MG tablet TAKE 1 TABLET BY MOUTH DAILY  . Cholecalciferol (VITAMIN D) 2000 UNITS tablet Take 1,000 Units by mouth daily.   . cyanocobalamin (,VITAMIN B-12,) 1000 MCG/ML injection Inject 1 mL (1,000 mcg total) into the muscle every 30 (thirty) days.  [name] DEXILANT 60 MG capsule TAKE ONE CAPSULE BY MOUTH EVERY DAY  . medroxyPROGESTERone (PROVERA) 10 MG tablet Take 10 mg by mouth. Every 3 months.  . methocarbamol (ROBAXIN) 500 MG tablet Take 1 tablet (500 mg total) by mouth every 6 (six) hours as needed.  . montelukast (SINGULAIR) 10 MG tablet TAKE 1 TABLET (10 MG TOTAL) BY MOUTH AT BEDTIME.  . Multiple Vitamin (MULTIVITAMIN WITH MINERALS) TABS tablet Take 1 tablet by mouth daily.  [name] nystatin cream (MYCOSTATIN) Apply topically as needed for dry skin.  . polyethylene glycol powder (GLYCOLAX/MIRALAX) powder TAKE 17 G BY MOUTH 2 (TWO) TIMES DAILY.  . ranitidine (ZANTAC) 300 MG tablet TAKE 1  TABLET (300 MG TOTAL) BY MOUTH AT BEDTIME.  [name] solifenacin (VESICARE) 10 MG tablet Take 10 mg by mouth daily. Total- 15mg  daily  . solifenacin (VESICARE) 5 MG tablet Take 5 mg by mouth daily.  .  SYRINGE/NEEDLE, DISP, 1 ML (B-D SYRINGE/NEEDLE 1CC/25GX5/8) 25G X 5/8" 1 ML MISC Use 1 monthly to administer her b12 injection    Allergies  Allergen Reactions  . Aspirin     REACTION: hives  . Latex     REACTION: rash    Physical Exam:  General - No distress ENT - No sinus tenderness, no oral exudate, no LAN Cardiac - s1s2 regular, no murmur Chest - No wheeze/rales/dullness Back - No focal tenderness Abd - Soft, non-tender Ext - No edema Neuro - Normal strength Skin - No rashes Psych - normal mood, and behavior   Assessment/Plan:  Chesley Mires, MD [place]Sleep Pager:  [phone] 04/01/2014, 11:24 AM

## 2014-04-01 NOTE — Assessment & Plan Note (Signed)
Stable.  No additional therapies needed at present.

## 2014-04-01 NOTE — Patient Instructions (Signed)
Continue nasal irrigation (salt water nasal spray) as needed for sinus congestion Can try nasacort two sprays each nostril daily as needed for sinus congestion Will arrange for new CPAP machine Follow up in 3 months

## 2014-04-15 ENCOUNTER — Telehealth: Payer: Self-pay | Admitting: Pulmonary Disease

## 2014-04-15 NOTE — Telephone Encounter (Signed)
lmomtcb x 1 for [facility]

## 2014-04-16 NOTE — Telephone Encounter (Signed)
I spoke with Charisse March and she states they need ov note and sleep study faxed to [phone], that has been done. [place] Bing, CMA

## 2014-04-16 NOTE — Telephone Encounter (Signed)
LMTCBx1 for [facility] to see what form was about so we know where to look. [place] Bing, CMA

## 2014-04-17 ENCOUNTER — Telehealth: Payer: Self-pay | Admitting: Pulmonary Disease

## 2014-04-17 NOTE — Telephone Encounter (Signed)
Spoke with the Tachia at [facility]  She states that the sleep study does not qualify pt for CPAP  It has to be a diagnostic study, and pt only had titration done  Please advise, thanks!

## 2014-04-17 NOTE — Telephone Encounter (Signed)
She had a diagnostic sleep study on 03/06/14 showing REM related sleep apnea.  If this DME is unwilling to arrange for CPAP, then please refer to alternate CPAP supplier.

## 2014-04-18 NOTE — Telephone Encounter (Signed)
Office note from before sleep study needed to state pt is using cpap and benefiting from it but could not locate original npsg sending pt for split night study this is for her insurance benefit Cynthia Kirk

## 2014-04-18 NOTE — Telephone Encounter (Signed)
Please advise PCC;s thanks 

## 2014-04-18 NOTE — Telephone Encounter (Signed)
Spoke to Cynthia Kirk she is aware this study was a split night study i will fax final report to her this should show qualifying of cpap [name]

## 2014-04-22 ENCOUNTER — Telehealth: Payer: Self-pay | Admitting: Pulmonary Disease

## 2014-04-22 ENCOUNTER — Encounter: Payer: Self-pay | Admitting: Pulmonary Disease

## 2014-04-22 NOTE — Telephone Encounter (Signed)
Pt aware that letter has been faxed to [facility]   Nothing further needed.

## 2014-04-22 NOTE — Telephone Encounter (Signed)
Letter printed, and placed in my look at section on side A.

## 2014-04-22 NOTE — Telephone Encounter (Signed)
I have faxed [facility] over to [facility] [phone] called pt adn LMTCB x1 Called alternate # received message call could not be completed to try again letter

## 2014-04-22 NOTE — Telephone Encounter (Addendum)
See message from 04/17/14: Spoke with patient, states that she spoke with her DME [facility]-states that all they need to get patient started on CPAP is a letter of compliance stating that she is benefiting from using the machine. Letter is needed as her last OV 04/01/14 did not have anything about her CPAP compliance documented. Explained to that patient that we have also been working to ensure that her insurance will cover the CPAP and she will not be forced to pay anything out of pocket.   Please advise Dr Halford Chessman. Thanks.

## 2014-04-25 ENCOUNTER — Telehealth: Payer: Self-pay | Admitting: Pulmonary Disease

## 2014-04-25 DIAGNOSIS — G4733 Obstructive sleep apnea (adult) (pediatric): Secondary | ICD-10-CM

## 2014-04-25 NOTE — Telephone Encounter (Signed)
Spoke with the pt  She states "tired of dealing with [facility]" She is asking to switch to [facility] for CPAP  I have sent new order [facility]  Nothing further needed per pt

## 2014-05-02 ENCOUNTER — Telehealth: Payer: Self-pay | Admitting: Pulmonary Disease

## 2014-05-02 DIAGNOSIS — G4733 Obstructive sleep apnea (adult) (pediatric): Secondary | ICD-10-CM

## 2014-05-02 NOTE — Telephone Encounter (Signed)
lmomtcb x1 

## 2014-05-03 ENCOUNTER — Encounter: Payer: Self-pay | Admitting: Internal Medicine

## 2014-05-03 NOTE — Telephone Encounter (Signed)
Okay to send order for sleep study in lab.

## 2014-05-03 NOTE — Telephone Encounter (Signed)
Per [place], pt ins did not except pt last sleep study, pt will need an NPSG and is willing to do so need dr sood to set this up.Cynthia Kirk

## 2014-05-03 NOTE — Telephone Encounter (Signed)
Pt aware that order has been placed and our PCC's will contact her with appt date and time. Nothing further needed at this time.

## 2014-05-03 NOTE — Telephone Encounter (Signed)
VS, please advise if okay to order NPSG. Thanks.

## 2014-05-06 MED ORDER — HYDROQUINONE 4 % EX CREA: TOPICAL_CREAM | Freq: Two times a day (BID) | CUTANEOUS | Status: DC

## 2014-05-07 DIAGNOSIS — I739 Peripheral vascular disease, unspecified: Secondary | ICD-10-CM | POA: Diagnosis not present

## 2014-05-07 DIAGNOSIS — L608 Other nail disorders: Secondary | ICD-10-CM | POA: Diagnosis not present

## 2014-05-07 DIAGNOSIS — M79609 Pain in unspecified limb: Secondary | ICD-10-CM | POA: Diagnosis not present

## 2014-05-28 ENCOUNTER — Ambulatory Visit (HOSPITAL_BASED_OUTPATIENT_CLINIC_OR_DEPARTMENT_OTHER): Payer: Medicare Other | Attending: Pulmonary Disease | Admitting: Radiology

## 2014-05-28 VITALS — Ht 64.0 in | Wt 230.0 lb

## 2014-05-28 DIAGNOSIS — Z79899 Other long term (current) drug therapy: Secondary | ICD-10-CM | POA: Insufficient documentation

## 2014-05-28 DIAGNOSIS — G4733 Obstructive sleep apnea (adult) (pediatric): Secondary | ICD-10-CM | POA: Insufficient documentation

## 2014-05-28 DIAGNOSIS — J99 Respiratory disorders in diseases classified elsewhere: Secondary | ICD-10-CM | POA: Diagnosis not present

## 2014-05-28 DIAGNOSIS — G4730 Sleep apnea, unspecified: Secondary | ICD-10-CM | POA: Diagnosis present

## 2014-05-28 DIAGNOSIS — H40009 Preglaucoma, unspecified, unspecified eye: Secondary | ICD-10-CM | POA: Diagnosis not present

## 2014-05-28 DIAGNOSIS — D869 Sarcoidosis, unspecified: Secondary | ICD-10-CM | POA: Diagnosis not present

## 2014-06-01 ENCOUNTER — Ambulatory Visit (INDEPENDENT_AMBULATORY_CARE_PROVIDER_SITE_OTHER): Payer: Medicare Other | Admitting: Internal Medicine

## 2014-06-01 ENCOUNTER — Encounter: Payer: Medicare Other | Admitting: Internal Medicine

## 2014-06-01 ENCOUNTER — Encounter: Payer: Self-pay | Admitting: Internal Medicine

## 2014-06-01 VITALS — BP 120/78 | HR 78 | Temp 98.4°F | Wt 253.0 lb

## 2014-06-01 DIAGNOSIS — J069 Acute upper respiratory infection, unspecified: Secondary | ICD-10-CM

## 2014-06-01 DIAGNOSIS — G4733 Obstructive sleep apnea (adult) (pediatric): Secondary | ICD-10-CM

## 2014-06-01 DIAGNOSIS — H6500 Acute serous otitis media, unspecified ear: Secondary | ICD-10-CM

## 2014-06-01 DIAGNOSIS — H6502 Acute serous otitis media, left ear: Secondary | ICD-10-CM

## 2014-06-01 DIAGNOSIS — H6121 Impacted cerumen, right ear: Secondary | ICD-10-CM

## 2014-06-01 DIAGNOSIS — D869 Sarcoidosis, unspecified: Secondary | ICD-10-CM | POA: Diagnosis not present

## 2014-06-01 DIAGNOSIS — H6120 Impacted cerumen, unspecified ear: Secondary | ICD-10-CM

## 2014-06-01 DIAGNOSIS — H9209 Otalgia, unspecified ear: Secondary | ICD-10-CM

## 2014-06-01 DIAGNOSIS — D860 Sarcoidosis of lung: Secondary | ICD-10-CM

## 2014-06-01 DIAGNOSIS — J99 Respiratory disorders in diseases classified elsewhere: Secondary | ICD-10-CM

## 2014-06-01 DIAGNOSIS — H9203 Otalgia, bilateral: Secondary | ICD-10-CM

## 2014-06-01 MED ORDER — HYDROCODONE-HOMATROPINE 5-1.5 MG/5ML PO SYRP: 5.0000 mL | ORAL_SOLUTION | ORAL | Status: DC | PRN

## 2014-06-01 MED ORDER — AMOXICILLIN-POT CLAVULANATE 875-125 MG PO TABS: 1.0000 | ORAL_TABLET | Freq: Two times a day (BID) | ORAL | Status: DC

## 2014-06-01 MED ORDER — FLUCONAZOLE 150 MG PO TABS: 150.0000 mg | ORAL_TABLET | Freq: Once | ORAL | Status: DC

## 2014-06-01 NOTE — Progress Notes (Signed)
No chief complaint on file.   HPI: Patient comes in today for SDA Saturday clinic for  new problem evaluation. Dr Hermina Staggers is pcp  Sees dr Halford Chessman  For osa on cpap   ROS: See pertinent positives and negatives per HPI.  Past Medical History  Diagnosis Date  . Hiatal hernia   . Morbid obesity 2010  . Esophageal reflux   . Allergic rhinitis, cause unspecified   . Unspecified asthma(493.90)   . Sarcoidosis 1996    @ [employer]  . Iron deficiency anemia   . Syncope     ?? Syncope ??  . Ejection fraction     EF 45-50%, echo, October 27, 2011  . H/O menorrhagia 2011  . Endometrial mass 2011  . Urinary incontinence, mixed 2011  . Simple endometrial hyperplasia 2011  . Herpes simplex type 2 infection complicating pregnancy   . ASCUS with positive high risk HPV   . VIN II (vulvar intraepithelial neoplasia II)   . Fibroid   . Anxiety   . H/O Clostridium difficile infection 03/2010  . Adenomatous colon polyp   . Blood transfusion 1976    Due to ectopic pregnancy  . Abnormal Pap smear 2008  . Obstructive sleep apnea (adult) (pediatric)     cpap  . Pneumonia 2005; 2007; 2009; 2011  . Type 2 diabetes, diet controlled   . Spinal stenosis, lumbar     s/p decompression laminectomy 09/2013  . Chronic back pain   . Panic attacks     Family History  Problem Relation Age of Onset  . Heart attack Father   . Hypertension Father   . Diabetes Father   . Seizures Father   . Migraines Father   . Kidney disease Sister   . Osteoporosis Sister   . Heart attack Sister   . Asthma Daughter   . Asthma Son   . Osteoporosis Sister   . Lung cancer Maternal Uncle   . Cancer Paternal Uncle   . Kidney disease Other   . Diabetes Other   . High Cholesterol Brother     x2    History   Social History  . Marital Status: Married    Spouse Name: N/A    Number of Children: 2  . Years of Education: PHD   Occupational History  . retired   .     Social History Main Topics  . Smoking status:  Never Smoker   . Smokeless tobacco: Never Used  . Alcohol Use: Yes     Comment: 09/05/2013 "might have a glass of wine maybe q couple months"  . Drug Use: No  . Sexual Activity: Yes    Birth Control/ Protection: Post-menopausal   Other Topics Concern  . Not on file   Social History Narrative   Pt lives at home with her spouse.   She does not use caffeine.    Outpatient Encounter Prescriptions as of 06/01/2014  Medication Sig  . albuterol (PROVENTIL HFA;VENTOLIN HFA) 108 (90 BASE) MCG/ACT inhaler Inhale 2 puffs into the lungs 4 (four) times daily as needed for wheezing or shortness of breath.  [name] albuterol (PROVENTIL) (2.5 MG/3ML) 0.083% nebulizer solution Take 2.5 mg by nebulization every 6 (six) hours as needed for shortness of breath.  . ALPRAZolam (XANAX) 1 MG tablet Take 1 tablet (1 mg total) by mouth 2 (two) times daily as needed for sleep or anxiety.  [name] [name] HCT 40-25 MG per tablet TAKE 1 TABLET EVERY DAY  . BYSTOLIC  5 MG tablet TAKE 1 TABLET BY MOUTH DAILY  . Cholecalciferol (VITAMIN D) 2000 UNITS tablet Take 1,000 Units by mouth daily.   . cyanocobalamin (,VITAMIN B-12,) 1000 MCG/ML injection Inject 1 mL (1,000 mcg total) into the muscle every 30 (thirty) days.  [name] DEXILANT 60 MG capsule TAKE ONE CAPSULE BY MOUTH EVERY DAY  . hydroquinone 4 % cream Apply topically 2 (two) times daily.  . medroxyPROGESTERone (PROVERA) 10 MG tablet Take 10 mg by mouth. Every 3 months.  . methocarbamol (ROBAXIN) 500 MG tablet Take 1 tablet (500 mg total) by mouth every 6 (six) hours as needed.  . montelukast (SINGULAIR) 10 MG tablet TAKE 1 TABLET (10 MG TOTAL) BY MOUTH AT BEDTIME.  . Multiple Vitamin (MULTIVITAMIN WITH MINERALS) TABS tablet Take 1 tablet by mouth daily.  [name] nystatin cream (MYCOSTATIN) Apply topically as needed for dry skin.  . polyethylene glycol powder (GLYCOLAX/MIRALAX) powder TAKE 17 G BY MOUTH 2 (TWO) TIMES DAILY.  . ranitidine (ZANTAC) 300 MG tablet TAKE 1 TABLET (300 MG  TOTAL) BY MOUTH AT BEDTIME.  [name] solifenacin (VESICARE) 10 MG tablet Take 10 mg by mouth daily. Total- 15mg  daily  . solifenacin (VESICARE) 5 MG tablet Take 5 mg by mouth daily.  . SYRINGE/NEEDLE, DISP, 1 ML (B-D SYRINGE/NEEDLE 1CC/25GX5/8) 25G X 5/8" 1 ML MISC Use 1 monthly to administer her b12 injection  . triamcinolone (NASACORT AQ) 55 MCG/ACT AERO nasal inhaler Place 2 sprays into the nose daily.    EXAM:  There were no vitals taken for this visit.  There is no weight on file to calculate BMI.  GENERAL: vitals reviewed and listed above, alert, oriented, appears well hydrated and in no acute distress HEENT: atraumatic, conjunctiva  clear, no obvious abnormalities on inspection of external nose and ears OP : no lesion edema or exudate  NECK: no obvious masses on inspection palpation  LUNGS: clear to auscultation bilaterally, no wheezes, rales or rhonchi, good air movement CV: HRRR, no clubbing cyanosis or  peripheral edema nl cap refill  MS: moves all extremities without noticeable focal  abnormality PSYCH: pleasant and cooperative, no obvious depression or anxiety  ASSESSMENT AND PLAN:  Discussed the following assessment and plan:  No diagnosis found.  -Patient advised to return or notify health care team  if symptoms worsen ,persist or new concerns arise.  There are no Patient Instructions on file for this visit.   Standley Brooking. Panosh M.D.

## 2014-06-01 NOTE — Progress Notes (Signed)
Pre visit review using our clinic review tool, if applicable. No additional management support is needed unless otherwise documented below in the visit note.   Chief Complaint  Patient presents with  . Cough    congestion, ear pain, sore throat, body aches, fever, headache since yesterday     HPI: Patient comes in today for SDA Saturday clinic for  new problem evaluation.  pcp dr L  Over 2 days hx of above  Tried   Husband has similar illness lasted 3 weeks  eventually rx for sinus infection. Samuel [place] and achy face tender but has ear pain bilaterally hurts to yawn and swallow  Under ent care gets wax in eac and in voice ent clinic at [facility]  Cough   And sore throat and wgt yawning  .  Tylenol. No blood   Coughing up yellow .     Sinus infection  ROS: See pertinent positives and negatives per HPI. No v or d or sob cp at this time. No travel   Past Medical History  Diagnosis Date  . Hiatal hernia   . Morbid obesity 2010  . Esophageal reflux   . Allergic rhinitis, cause unspecified   . Unspecified asthma(493.90)   . Sarcoidosis 1996    @ [employer]  . Iron deficiency anemia   . Syncope     ?? Syncope ??  . Ejection fraction     EF 45-50%, echo, October 27, 2011  . H/O menorrhagia 2011  . Endometrial mass 2011  . Urinary incontinence, mixed 2011  . Simple endometrial hyperplasia 2011  . Herpes simplex type 2 infection complicating pregnancy   . ASCUS with positive high risk HPV   . VIN II (vulvar intraepithelial neoplasia II)   . Fibroid   . Anxiety   . H/O Clostridium difficile infection 03/2010  . Adenomatous colon polyp   . Blood transfusion 1976    Due to ectopic pregnancy  . Abnormal Pap smear 2008  . Obstructive sleep apnea (adult) (pediatric)     cpap  . Pneumonia 2005; 2007; 2009; 2011  . Type 2 diabetes, diet controlled   . Spinal stenosis, lumbar     s/p decompression laminectomy 09/2013  . Chronic back pain   . Panic attacks     Family History   Problem Relation Age of Onset  . Heart attack Father   . Hypertension Father   . Diabetes Father   . Seizures Father   . Migraines Father   . Kidney disease Sister   . Osteoporosis Sister   . Heart attack Sister   . Asthma Daughter   . Asthma Son   . Osteoporosis Sister   . Lung cancer Maternal Uncle   . Cancer Paternal Uncle   . Kidney disease Other   . Diabetes Other   . High Cholesterol Brother     x2    History   Social History  . Marital Status: Married    Spouse Name: N/A    Number of Children: 2  . Years of Education: PHD   Occupational History  . retired   .     Social History Main Topics  . Smoking status: Never Smoker   . Smokeless tobacco: Never Used  . Alcohol Use: Yes     Comment: 09/05/2013 "might have a glass of wine maybe q couple months"  . Drug Use: No  . Sexual Activity: Yes    Birth Control/ Protection: Post-menopausal   Other Topics  Concern  . None   Social History Narrative   Pt lives at home with her spouse.   She does not use caffeine.    Outpatient Encounter Prescriptions as of 06/01/2014  Medication Sig  . albuterol (PROVENTIL HFA;VENTOLIN HFA) 108 (90 BASE) MCG/ACT inhaler Inhale 2 puffs into the lungs 4 (four) times daily as needed for wheezing or shortness of breath.  [name] albuterol (PROVENTIL) (2.5 MG/3ML) 0.083% nebulizer solution Take 2.5 mg by nebulization every 6 (six) hours as needed for shortness of breath.  . ALPRAZolam (XANAX) 1 MG tablet Take 1 tablet (1 mg total) by mouth 2 (two) times daily as needed for sleep or anxiety.  [name] [name] HCT 40-25 MG per tablet TAKE 1 TABLET EVERY DAY  . BYSTOLIC 5 MG tablet TAKE 1 TABLET BY MOUTH DAILY  . Cholecalciferol (VITAMIN D) 2000 UNITS tablet Take 1,000 Units by mouth daily.   . cyanocobalamin (,VITAMIN B-12,) 1000 MCG/ML injection Inject 1 mL (1,000 mcg total) into the muscle every 30 (thirty) days.  [name] DEXILANT 60 MG capsule TAKE ONE CAPSULE BY MOUTH EVERY DAY  . hydroquinone 4 %  cream Apply topically 2 (two) times daily.  . medroxyPROGESTERone (PROVERA) 10 MG tablet Take 10 mg by mouth. Every 3 months.  . methocarbamol (ROBAXIN) 500 MG tablet Take 1 tablet (500 mg total) by mouth every 6 (six) hours as needed.  . montelukast (SINGULAIR) 10 MG tablet TAKE 1 TABLET (10 MG TOTAL) BY MOUTH AT BEDTIME.  . Multiple Vitamin (MULTIVITAMIN WITH MINERALS) TABS tablet Take 1 tablet by mouth daily.  [name] nystatin cream (MYCOSTATIN) Apply topically as needed for dry skin.  . polyethylene glycol powder (GLYCOLAX/MIRALAX) powder TAKE 17 G BY MOUTH 2 (TWO) TIMES DAILY.  . ranitidine (ZANTAC) 300 MG tablet TAKE 1 TABLET (300 MG TOTAL) BY MOUTH AT BEDTIME.  [name] solifenacin (VESICARE) 10 MG tablet Take 10 mg by mouth daily. Total- 15mg  daily  . solifenacin (VESICARE) 5 MG tablet Take 5 mg by mouth daily.  . SYRINGE/NEEDLE, DISP, 1 ML (B-D SYRINGE/NEEDLE 1CC/25GX5/8) 25G X 5/8" 1 ML MISC Use 1 monthly to administer her b12 injection  . triamcinolone (NASACORT AQ) 55 MCG/ACT AERO nasal inhaler Place 2 sprays into the nose daily.  [name] amoxicillin-clavulanate (AUGMENTIN) 875-125 MG per tablet Take 1 tablet by mouth every 12 (twelve) hours.  . fluconazole (DIFLUCAN) 150 MG tablet Take 1 tablet (150 mg total) by mouth once.  [name] HYDROcodone-homatropine (HYCODAN) 5-1.5 MG/5ML syrup Take 5 mLs by mouth every 4 (four) hours as needed for cough. 1-2 tsp    EXAM:  BP 120/78  Pulse 78  Temp(Src) 98.4 F (36.9 C) (Oral)  Wt 253 lb (114.76 kg)  SpO2 99%  Body mass index is 43.41 kg/(m^2). WDWN in NAD  quiet respirations; congested  somewhat hoarse. Non toxic . Feel under the weather  HEENT: Normocephalic ;atraumatic , Eyes;  PERRL, EOMs  Full, lids and conjunctiva clear,,Ears: no deformities, canals wax in right  Left min wax tm pink and clear yellow  Bony lm nl , Nose: no deformity congested;face minimally tender max frontal Mouth : OP clear without lesion or edema . Neck: Supple without adenopathy or  masses or bruits tender ac nodes bilat  tmj non tender  Chest:  Clear to A without wheezes rales or rhonchi CV:  S1-S2 no gallops or murmurs peripheral perfusion is normal Skin :nl perfusion and no acute rashes   ASSESSMENT AND PLAN:  Discussed the following assessment and plan:  Ear pain, bilateral  Acute serous otitis media of left ear, recurrence not specified  Acute upper respiratory infection of multiple sites  Pulmonary sarcoidosis  Excess wax in ear, right  Morbid obesity  OBSTRUCTIVE SLEEP APNEA Left tm abnormal and may resolve on own without antibiotic   Risk benefit of medication discussed.  Remote hx of d diff take probiotic   augmentin for 5-7 days  Underlying sarcoid / status  May benefit because of severity of sx  Explained  cough will continue  Primary illness seems viral   Cannot see right tm at this visit  -Patient advised to return or notify health care team  if symptoms worsen ,persist or new concerns arise.  Patient Instructions  Antibiotic may help the ear and sinus infection but caution because of hx of c diff  rx for 5 -7 days. Cough med as needed. Expect cough to last 1-2 weeks  Fu with your PCP if  persistent or progressive   Sx      Wanda K. Panosh M.D.

## 2014-06-01 NOTE — Patient Instructions (Signed)
Antibiotic may help the ear and sinus infection but caution because of hx of c diff  rx for 5 -7 days. Cough med as needed. Expect cough to last 1-2 weeks  Fu with your PCP if  persistent or progressive   Sx

## 2014-06-05 ENCOUNTER — Telehealth: Payer: Self-pay | Admitting: Pulmonary Disease

## 2014-06-05 DIAGNOSIS — G4733 Obstructive sleep apnea (adult) (pediatric): Secondary | ICD-10-CM

## 2014-06-05 NOTE — Sleep Study (Signed)
[place]  NAME: Cynthia Kirk DATE OF BIRTH:  04-20-1951 MEDICAL RECORD NUMBER [id number]  LOCATION: [facility]  PHYSICIAN: Chesley Mires, M.D. DATE OF STUDY: 05/28/2014  SLEEP STUDY TYPE: Polysomnogram               REFERRING PHYSICIAN: Chesley Mires, MD  INDICATION FOR STUDY:  Cynthia Kirk is a 63 y.o. female with history of snoring, sleep disruption, apnea, and daytime sleepiness.  She also has a history of pulmonary sarcoidosis.  She had previous diagnosis of sleep apnea, and has been on CPAP therapy.  She returns to the sleep lab to determine if she qualifies for continued use of CPAP therapy.  EPWORTH SLEEPINESS SCORE: 21. HEIGHT: 5\' 4"  (162.6 cm)  WEIGHT: 230 lb (104.327 kg)    Body mass index is 39.46 kg/(m^2).  NECK SIZE: 14.5 in.  MEDICATIONS:  Current Outpatient Prescriptions on File Prior to Visit  Medication Sig Dispense Refill  . albuterol (PROVENTIL HFA;VENTOLIN HFA) 108 (90 BASE) MCG/ACT inhaler Inhale 2 puffs into the lungs 4 (four) times daily as needed for wheezing or shortness of breath.      [name] albuterol (PROVENTIL) (2.5 MG/3ML) 0.083% nebulizer solution Take 2.5 mg by nebulization every 6 (six) hours as needed for shortness of breath.      . ALPRAZolam (XANAX) 1 MG tablet Take 1 tablet (1 mg total) by mouth 2 (two) times daily as needed for sleep or anxiety.  60 tablet  2  . [name] HCT 40-25 MG per tablet TAKE 1 TABLET EVERY DAY  30 tablet  5  . BYSTOLIC 5 MG tablet TAKE 1 TABLET BY MOUTH DAILY  30 tablet  5  . Cholecalciferol (VITAMIN D) 2000 UNITS tablet Take 1,000 Units by mouth daily.       . cyanocobalamin (,VITAMIN B-12,) 1000 MCG/ML injection Inject 1 mL (1,000 mcg total) into the muscle every 30 (thirty) days.  10 mL  1  . DEXILANT 60 MG capsule TAKE ONE CAPSULE BY MOUTH EVERY DAY  30 capsule  11  . hydroquinone 4 % cream Apply topically 2 (two) times daily.  28.35 g  0  . medroxyPROGESTERone (PROVERA) 10 MG  tablet Take 10 mg by mouth. Every 3 months.      . methocarbamol (ROBAXIN) 500 MG tablet Take 1 tablet (500 mg total) by mouth every 6 (six) hours as needed.  60 tablet  1  . montelukast (SINGULAIR) 10 MG tablet TAKE 1 TABLET (10 MG TOTAL) BY MOUTH AT BEDTIME.  30 tablet  2  . Multiple Vitamin (MULTIVITAMIN WITH MINERALS) TABS tablet Take 1 tablet by mouth daily.      [name] nystatin cream (MYCOSTATIN) Apply topically as needed for dry skin.  60 g  0  . polyethylene glycol powder (GLYCOLAX/MIRALAX) powder TAKE 17 G BY MOUTH 2 (TWO) TIMES DAILY.  527 g  11  . ranitidine (ZANTAC) 300 MG tablet TAKE 1 TABLET (300 MG TOTAL) BY MOUTH AT BEDTIME.  30 tablet  11  . solifenacin (VESICARE) 10 MG tablet Take 10 mg by mouth daily. Total- 15mg  daily      . solifenacin (VESICARE) 5 MG tablet Take 5 mg by mouth daily.      . SYRINGE/NEEDLE, DISP, 1 ML (B-D SYRINGE/NEEDLE 1CC/25GX5/8) 25G X 5/8" 1 ML MISC Use 1 monthly to administer her b12 injection  12 each  0  . triamcinolone (NASACORT AQ) 55 MCG/ACT AERO nasal inhaler Place 2 sprays into the  nose daily.  1 Inhaler  12   No current facility-administered medications on file prior to visit.    SLEEP ARCHITECTURE:  Total recording time: 358.5 minutes.  Total sleep time was: 236 minutes.  Sleep efficiency: 65.8%.  Sleep latency: 69.5 minutes.  REM latency: 197.5 minutes.  Stage N1: 9.1%.  Stage N2: 81.4%.  Stage N3: 0%.  Stage R:  9.5%.  Supine sleep: 178 minutes.  Non-supine sleep: 58 minutes.  CARDIAC DATA:  Average heart rate: 69 beats per minute. Rhythm strip: sinus rhythm.  RESPIRATORY DATA: Average respiratory rate: 18. Snoring: moderate. Average AHI: 5.8.   Apnea index: 1.3.  Hypopnea index: 5.8. Obstructive apnea index: 1.3.  Central apnea index: 0.  Mixed apnea index: 0. REM AHI: 29.3.  NREM AHI: 0. Supine AHI: 2.4. Non-supine AHI: 0.  MOVEMENT/PARASOMNIA:  Periodic limb movement: 0.  Period limb movements with arousals: 0. Restroom trips:  two.  OXYGEN DATA:  Baseline oxygenation: 99%. Lowest SaO2: 85%. Time spent below SaO2 90%: 3.6 minutes. Supplemental oxygen used: none.  IMPRESSION/ RECOMMENDATION:   This study shows mild obstructive sleep apnea with an AHI of 5.8 and SaO2 low of 85%.  She had a significant REM effect to her sleep apnea.   Additional therapies include weight loss, CPAP, oral appliance, or surgical evaluation.  Chesley Mires, M.D. Diplomate, Tax adviser of Sleep Medicine  ELECTRONICALLY SIGNED ON:  06/05/2014, 9:54 AM [place] PH: [phone]   FX: [phone] [place]

## 2014-06-05 NOTE — Telephone Encounter (Signed)
PSG 05/28/14 >> AHI 5.8, SaO2 low 85%.  REM AHI 29.3  Will have my nurse inform pt that sleep study shows mild sleep apnea overall, but worse in REM (dream) sleep.  This is similar to previous sleep studies.  Please route sleep studies results to [facility], and place order for pt to get new CPAP machine.  She needs CPAP at 9 cm H2O with heated humidity.

## 2014-06-11 NOTE — Telephone Encounter (Signed)
Pt returned call.  Spoke with patient and advised of sleep study results as stated by VS below.  Pt okay with these recommendations and voiced her understanding.  Order sent to [facility] for the new CPAP machine at 9cm H2O with heated humidity.  Nothing further needed; will sign off.

## 2014-06-11 NOTE — Telephone Encounter (Signed)
LMOM x 1 

## 2014-06-12 ENCOUNTER — Encounter (HOSPITAL_BASED_OUTPATIENT_CLINIC_OR_DEPARTMENT_OTHER): Payer: Medicare Other

## 2014-06-17 ENCOUNTER — Other Ambulatory Visit: Payer: Self-pay | Admitting: Obstetrics and Gynecology

## 2014-06-17 DIAGNOSIS — R87610 Atypical squamous cells of undetermined significance on cytologic smear of cervix (ASC-US): Secondary | ICD-10-CM | POA: Diagnosis not present

## 2014-06-17 DIAGNOSIS — B372 Candidiasis of skin and nail: Secondary | ICD-10-CM | POA: Diagnosis not present

## 2014-06-17 DIAGNOSIS — D260 Other benign neoplasm of cervix uteri: Secondary | ICD-10-CM | POA: Diagnosis not present

## 2014-06-17 DIAGNOSIS — N870 Mild cervical dysplasia: Secondary | ICD-10-CM | POA: Diagnosis not present

## 2014-06-19 ENCOUNTER — Telehealth: Payer: Self-pay | Admitting: Pulmonary Disease

## 2014-06-19 DIAGNOSIS — G4733 Obstructive sleep apnea (adult) (pediatric): Secondary | ICD-10-CM

## 2014-06-19 NOTE — Telephone Encounter (Signed)
Attempted to call [facility]ia about [facility] and no answer.    Will try back tomorrow.

## 2014-06-19 NOTE — Telephone Encounter (Signed)
tachia calling a/b this pt cn b reached @ [phone].Hillery Hunter

## 2014-06-20 NOTE — Telephone Encounter (Signed)
Okay to send order to change CPAP to 10 cm H2O 

## 2014-06-20 NOTE — Telephone Encounter (Signed)
Called and spoke to pt. Pt stated that she would prefer her CPAP pressure to be set at 10 instead of 9. Pt stated she previous had her pressure set at 9 and changed to 10 because she felt the pressure wasn't adequate. [facility]  VS please advise if ok to change pressure per pt's request.

## 2014-06-20 NOTE — Telephone Encounter (Signed)
Spoke with [facility], aware that order has been placed for CPAP pressure change.  Pt aware order placed as well.  Nothing further needed.

## 2014-06-24 DIAGNOSIS — M545 Low back pain, unspecified: Secondary | ICD-10-CM | POA: Diagnosis not present

## 2014-06-24 DIAGNOSIS — M1710 Unilateral primary osteoarthritis, unspecified knee: Secondary | ICD-10-CM | POA: Diagnosis not present

## 2014-06-25 ENCOUNTER — Telehealth: Payer: Self-pay | Admitting: Pulmonary Disease

## 2014-06-25 NOTE — Telephone Encounter (Signed)
Spoke with Patient, states that she is no longer using [facility] and has switched to [facility] Per pt, switched d/t poor customer service and lack of efficiency when filing insurance and contacting our office. (could not get machine coverage in a timely manner) Phone note from 04/25/14 shows this transition to the new company. Pt had a sleep study completed 03/2014 and insurance denied coverage of CPAP until NPSG was completed.  NPSG completed 05/28/14--Dr Halford Chessman reviewed results with pt and pt was then set up on CPAP Chesley Mires, MD at 06/05/2014 10:03 AM     Status: Signed        PSG 05/28/14 >> AHI 5.8, SaO2 low 85%. REM AHI 29.3  Will have my nurse inform pt that sleep study shows mild sleep apnea overall, but worse in REM (dream) sleep. This is similar to previous sleep studies.  Please route sleep studies results to [facility], and place order for pt to get new CPAP machine. She needs CPAP at 9 cm H2O with heated humidity.   Spoke with Jeneen Rinks at [facility] that they do in fact have all the information and results needed and that the patients machine is covered by insurance--James ensured me that the patient should not have any issues with the billing aspect of the CPAP. Per Jeneen Rinks, nothing further needed from our office at this time. ---- Called patient back and informed her that [facility] does in fact have everything needed and she should not have any billing issues. Pt states that the real issue is that she had an old machine that was paid for and [facility] picked it up for repairs and gave her a loaner machine. Pt states that she has been trying to get this machine back since the switch of DME and they are refusing to give her her paid for machine. [facility] is not aware of the DME switch apparantly ---- Called [facility], spoke with Alric Quan, to let them know that pt is now with [facility] and CPAP is being covered by them. Advised [facility] that the patient is requesting her old machine be  delivered to her home as well as the loaner machine which belongs to them be picked up. States that RT Caryl Pina is to get in touch with patient to pick up loaner machine and give her back her old machine.   Pt aware of the above and states that she will await the call from [facility]. Will contact our office if anything further needed.

## 2014-06-26 ENCOUNTER — Telehealth: Payer: Self-pay | Admitting: Pulmonary Disease

## 2014-06-26 MED ORDER — MONTELUKAST SODIUM 10 MG PO TABS: ORAL_TABLET | ORAL | Status: DC

## 2014-06-26 NOTE — Telephone Encounter (Signed)
Called and spoke with pharmacy and they stated that the pt is calling for refill of the singulair.  This has been called to the pharmacy.  Nothing further is needed.

## 2014-07-01 DIAGNOSIS — M549 Dorsalgia, unspecified: Secondary | ICD-10-CM | POA: Diagnosis not present

## 2014-07-03 ENCOUNTER — Other Ambulatory Visit: Payer: Self-pay | Admitting: Neurological Surgery

## 2014-07-03 DIAGNOSIS — M545 Low back pain: Secondary | ICD-10-CM

## 2014-07-15 ENCOUNTER — Encounter: Payer: Self-pay | Admitting: Pulmonary Disease

## 2014-07-15 ENCOUNTER — Ambulatory Visit: Payer: Medicare Other

## 2014-07-15 ENCOUNTER — Ambulatory Visit (INDEPENDENT_AMBULATORY_CARE_PROVIDER_SITE_OTHER): Payer: Medicare Other | Admitting: Pulmonary Disease

## 2014-07-15 ENCOUNTER — Other Ambulatory Visit (INDEPENDENT_AMBULATORY_CARE_PROVIDER_SITE_OTHER): Payer: Medicare Other

## 2014-07-15 VITALS — BP 110/70 | HR 74 | Temp 98.2°F | Ht 64.25 in | Wt 249.2 lb

## 2014-07-15 DIAGNOSIS — J99 Respiratory disorders in diseases classified elsewhere: Secondary | ICD-10-CM

## 2014-07-15 DIAGNOSIS — R05 Cough: Secondary | ICD-10-CM | POA: Diagnosis not present

## 2014-07-15 DIAGNOSIS — D860 Sarcoidosis of lung: Secondary | ICD-10-CM

## 2014-07-15 DIAGNOSIS — D869 Sarcoidosis, unspecified: Secondary | ICD-10-CM | POA: Diagnosis not present

## 2014-07-15 DIAGNOSIS — J45909 Unspecified asthma, uncomplicated: Secondary | ICD-10-CM

## 2014-07-15 DIAGNOSIS — G4733 Obstructive sleep apnea (adult) (pediatric): Secondary | ICD-10-CM | POA: Diagnosis not present

## 2014-07-15 DIAGNOSIS — J4530 Mild persistent asthma, uncomplicated: Secondary | ICD-10-CM

## 2014-07-15 DIAGNOSIS — R058 Other specified cough: Secondary | ICD-10-CM

## 2014-07-15 DIAGNOSIS — R059 Cough, unspecified: Secondary | ICD-10-CM

## 2014-07-15 LAB — BASIC METABOLIC PANEL
BUN: 13 mg/dL (ref 6–23)
CHLORIDE: 101 meq/L (ref 96–112)
CO2: 26 meq/L (ref 19–32)
CREATININE: 0.9 mg/dL (ref 0.4–1.2)
Calcium: 8.9 mg/dL (ref 8.4–10.5)
GFR: 83.35 mL/min (ref >60.00)
GLUCOSE: 94 mg/dL (ref 70–99)
Potassium: 3.6 mEq/L (ref 3.5–5.1)
Sodium: 137 mEq/L (ref 135–145)

## 2014-07-15 MED ORDER — ALBUTEROL SULFATE (2.5 MG/3ML) 0.083% IN NEBU: 2.5000 mg | INHALATION_SOLUTION | Freq: Four times a day (QID) | RESPIRATORY_TRACT | Status: DC | PRN

## 2014-07-15 MED ORDER — MOMETASONE FUROATE 220 MCG/INH IN AEPB: 2.0000 | INHALATION_SPRAY | Freq: Every day | RESPIRATORY_TRACT | Status: DC

## 2014-07-15 NOTE — Progress Notes (Signed)
Chief Complaint  Patient presents with  . Follow-up    Pt denies any breathing issues. Pt c/o incr mucus/PND every AM. Pt states that that times it will cause her airway to become "clogged"--pt states it feel like the mucus is coming from lungs.  Requests refills of  Albuterol Nebulizer to local pharmacy.    History of Present Illness: Cynthia Kirk is a 63 y.o. female with Asthma, Sarcoidosis, OSA, Rhinitis, and Hoarseness.  She has been doing well with CPAP.  She has nasal mask and no issues with this.  She still has cough with congestion.  This is coming from her chest.  She sometimes coughs up plugs, and this can make her gag.  She was seen recently by Dr. Redmond Baseman with ENT and told her sinuses were okay.  She has used asmanex in the past and this has helped.  She denies chest pain, fever, or hemoptysis.  TESTS: Spirometry 06/30/09 >> FEV1 1.69(87%), FVC 2.17(83%), FEV1% 78 CPAP 12/25/12 to 01/23/13 >> Used on 20 of 30 nights with average 3 hrs 51 min. Average AHI 0.8 with CPAP 9 cm H2O. Minimal airleak. PSG 03/06/14 >> AHI 1.9, REM AHI 26.7, SaO2 low 80%. PSG 05/28/14 >> AHI 5.8, SaO2 low 85%. REM AHI 29.3  PMHx, PSHx, Medications, Allergies, Fhx, Shx reviewed.  Physical Exam:  General - No distress ENT - No sinus tenderness, no oral exudate, no LAN Cardiac - s1s2 regular, no murmur Chest - No wheeze/rales/dullness Back - No focal tenderness Abd - Soft, non-tender Ext - No edema Neuro - Normal strength Skin - No rashes Psych - normal mood, and behavior   Assessment/Plan:  Cynthia Mires, MD [place] [facility]/Critical [facility]/Sleep Pager:  [phone] 07/15/2014, 11:12 AM

## 2014-07-15 NOTE — Assessment & Plan Note (Signed)
Stable at present.   

## 2014-07-15 NOTE — Patient Instructions (Signed)
Asmanex two puffs daily >> rinse mouth after each use Will schedule CT chest and PFT (breathing test) Follow up in 3 months

## 2014-07-15 NOTE — Assessment & Plan Note (Signed)
Not sure her current symptoms are related to progression of sarcoidosis.  Will repeat PFT and CT chest with IV contrast to further assess.

## 2014-07-15 NOTE — Addendum Note (Signed)
Addended by: Len Blalock on: 07/15/2014 11:39 AM   Modules accepted: Orders

## 2014-07-15 NOTE — Assessment & Plan Note (Signed)
Her current symptoms are most likely from asthma.  Will have her try asmanex.  She is to continue singulair and prn albuterol.

## 2014-07-15 NOTE — Assessment & Plan Note (Signed)
She is compliant with therapy and reports benefit from CPAP.

## 2014-07-17 ENCOUNTER — Ambulatory Visit (INDEPENDENT_AMBULATORY_CARE_PROVIDER_SITE_OTHER)
Admission: RE | Admit: 2014-07-17 | Discharge: 2014-07-17 | Disposition: A | Discharge status: Home / Self Care | Payer: Medicare Other | Source: Ambulatory Visit | Attending: Pulmonary Disease | Admitting: Pulmonary Disease

## 2014-07-17 DIAGNOSIS — D869 Sarcoidosis, unspecified: Secondary | ICD-10-CM

## 2014-07-17 DIAGNOSIS — J99 Respiratory disorders in diseases classified elsewhere: Secondary | ICD-10-CM | POA: Diagnosis not present

## 2014-07-17 DIAGNOSIS — J8410 Pulmonary fibrosis, unspecified: Secondary | ICD-10-CM | POA: Diagnosis not present

## 2014-07-17 DIAGNOSIS — D860 Sarcoidosis of lung: Secondary | ICD-10-CM

## 2014-07-17 MED ORDER — IOHEXOL 300 MG/ML  SOLN
80.0000 mL | Freq: Once | INTRAMUSCULAR | Status: AC | PRN
  Administered 2014-07-17: 80 mL via INTRAVENOUS

## 2014-07-23 ENCOUNTER — Telehealth: Payer: Self-pay | Admitting: Pulmonary Disease

## 2014-07-23 NOTE — Telephone Encounter (Signed)
CT chest 07/17/14 >> upper lobe predominant BTX, GGO, nodularity no change since 2011  Will have my nurse inform pt that CT chest shows chronic changes of sarcoidosis in her lungs.  No change since 2011.  Will call back after review of her PFT's.  No change to current treatment plan.

## 2014-07-24 ENCOUNTER — Telehealth: Payer: Self-pay | Admitting: Internal Medicine

## 2014-07-24 NOTE — Telephone Encounter (Signed)
Ok with me to change -  may schedule with Dr Doug Sou and see another MD at [facility] as temporary if needed between now and then

## 2014-07-24 NOTE — Telephone Encounter (Signed)
Patient is requesting to transfer to Dr. Doug Sou b/c She recently moved to [facility] [place] [facility] and can not make appointments until late afternoon.  Please advise.

## 2014-07-24 NOTE — Telephone Encounter (Signed)
Results have been explained to patient, pt expressed understanding. Nothing further needed.  

## 2014-08-17 ENCOUNTER — Other Ambulatory Visit: Payer: Self-pay | Admitting: Internal Medicine

## 2014-08-19 ENCOUNTER — Other Ambulatory Visit: Payer: Self-pay

## 2014-08-19 DIAGNOSIS — R209 Unspecified disturbances of skin sensation: Secondary | ICD-10-CM | POA: Diagnosis not present

## 2014-08-19 DIAGNOSIS — M79609 Pain in unspecified limb: Secondary | ICD-10-CM | POA: Diagnosis not present

## 2014-08-19 MED ORDER — ALPRAZOLAM 1 MG PO TABS: 1.0000 mg | ORAL_TABLET | Freq: Two times a day (BID) | ORAL | Status: DC | PRN

## 2014-08-21 ENCOUNTER — Telehealth: Payer: Self-pay | Admitting: Pulmonary Disease

## 2014-08-21 NOTE — Telephone Encounter (Signed)
Spoke with the pt  She states that she was told by [facility] that she needed ov with VS  She was just seen 07/15/14  [facility] for [facility] with [facility]

## 2014-08-22 NOTE — Telephone Encounter (Signed)
Staff msg sent to [facility] in triage please

## 2014-08-22 NOTE — Telephone Encounter (Signed)
Sent staff message to [facility] with [facility] to advise.

## 2014-08-22 NOTE — Telephone Encounter (Signed)
Check to see if appointment needs to be with me or if she can be seen by Tammy Parrett.

## 2014-08-22 NOTE — Telephone Encounter (Signed)
[place], [place]. Finally figured out what happened with this pt. She was set up on her CPAP on 06/19/14 and needed to have her f/u office visit with VS 31 - 90 days after set up so her appt on 8/10 was too early for her insurance.  Let me know if you have any other questions.  Thanks  [employer]

## 2014-08-22 NOTE — Telephone Encounter (Signed)
Spoke with the pt and notified that her ins requires appt with VS 31-90 days after CPAP set up  Her last visit was considered by ins too soon  I advised that she will need appt before 09/19/14  She verbalized understanding  She states will have to have a late afternoon appt since she depends on her spouse to bring her  Please advise if okay to overbook your schedule since there is nothing open, thanks!

## 2014-08-26 NOTE — Telephone Encounter (Signed)
Magda Paganini did you hear back from [place]?  thanks

## 2014-08-26 NOTE — Telephone Encounter (Signed)
Called and spoke with Lenna Sciara  She states that the pt can see TP  I spoke with pt and scheduled ov with TP for 09/09/14 at 4:30  Nothing further needed

## 2014-08-28 DIAGNOSIS — R87810 Cervical high risk human papillomavirus (HPV) DNA test positive: Secondary | ICD-10-CM | POA: Diagnosis not present

## 2014-08-28 DIAGNOSIS — B372 Candidiasis of skin and nail: Secondary | ICD-10-CM | POA: Diagnosis not present

## 2014-08-28 DIAGNOSIS — R87610 Atypical squamous cells of undetermined significance on cytologic smear of cervix (ASC-US): Secondary | ICD-10-CM | POA: Diagnosis not present

## 2014-09-03 ENCOUNTER — Other Ambulatory Visit: Payer: Self-pay

## 2014-09-03 MED ORDER — NEBIVOLOL HCL 5 MG PO TABS: ORAL_TABLET | ORAL | Status: DC

## 2014-09-04 ENCOUNTER — Other Ambulatory Visit: Payer: Self-pay

## 2014-09-04 ENCOUNTER — Ambulatory Visit: Payer: Medicare Other | Admitting: Internal Medicine

## 2014-09-04 MED ORDER — NEBIVOLOL HCL 5 MG PO TABS: ORAL_TABLET | ORAL | Status: DC

## 2014-09-09 ENCOUNTER — Ambulatory Visit (INDEPENDENT_AMBULATORY_CARE_PROVIDER_SITE_OTHER): Payer: Medicare Other | Admitting: Adult Health

## 2014-09-09 ENCOUNTER — Encounter: Payer: Self-pay | Admitting: Adult Health

## 2014-09-09 ENCOUNTER — Encounter: Payer: Self-pay | Admitting: Internal Medicine

## 2014-09-09 ENCOUNTER — Ambulatory Visit (INDEPENDENT_AMBULATORY_CARE_PROVIDER_SITE_OTHER): Payer: Medicare Other | Admitting: Internal Medicine

## 2014-09-09 VITALS — BP 118/80 | HR 62 | Temp 97.5°F | Ht 64.0 in | Wt 250.0 lb

## 2014-09-09 VITALS — BP 122/64 | HR 69 | Temp 97.7°F | Resp 14 | Ht 64.0 in | Wt 250.8 lb

## 2014-09-09 DIAGNOSIS — D860 Sarcoidosis of lung: Secondary | ICD-10-CM

## 2014-09-09 DIAGNOSIS — Z9989 Dependence on other enabling machines and devices: Principal | ICD-10-CM

## 2014-09-09 DIAGNOSIS — I10 Essential (primary) hypertension: Secondary | ICD-10-CM | POA: Diagnosis not present

## 2014-09-09 DIAGNOSIS — N959 Unspecified menopausal and perimenopausal disorder: Secondary | ICD-10-CM

## 2014-09-09 DIAGNOSIS — Z23 Encounter for immunization: Secondary | ICD-10-CM | POA: Diagnosis not present

## 2014-09-09 DIAGNOSIS — E538 Deficiency of other specified B group vitamins: Secondary | ICD-10-CM

## 2014-09-09 DIAGNOSIS — G4733 Obstructive sleep apnea (adult) (pediatric): Secondary | ICD-10-CM | POA: Diagnosis not present

## 2014-09-09 DIAGNOSIS — E119 Type 2 diabetes mellitus without complications: Secondary | ICD-10-CM | POA: Diagnosis not present

## 2014-09-09 NOTE — Assessment & Plan Note (Addendum)
Compensated w/ good compliance Cynthia Kirk  Encouraged on wt loss  Plan  Continue with CPAP At bedtime   Keep up good work .  follow up Dr. Halford Chessman  As planned and As needed   Please contact office for sooner follow up if symptoms do not improve or worsen or seek emergency care

## 2014-09-09 NOTE — Progress Notes (Signed)
   Subjective:    Patient ID: Cynthia Kirk, female    DOB: 1951-02-16, 63 y.o.   MRN: [id number]  HPI 63 yo with Asthma, Sarcoidosis, OSA on CPAP   09/09/2014 Follow up OSA  Returns for follow up  Needs f/u per [facility] to show complaince for CPAP. Download over last 1 month shows good compliance with 77% usage w/ avg ~8 hr . On CPAP 10 cm .  AHI 0.2.  Says she is doing well with new machine .  No mask issues. Feels rested during daytime w/ no excessive daytime sleepiness.   TESTS: Spirometry 06/30/09 >> FEV1 1.69(87%), FVC 2.17(83%), FEV1% 78 CPAP 12/25/12 to 01/23/13 >> Used on 20 of 30 nights with average 3 hrs 51 min. Average AHI 0.8 with CPAP 9 cm H2O. Minimal airleak. PSG 03/06/14 >> AHI 1.9, REM AHI 26.7, SaO2 low 80%. PSG 05/28/14 >> AHI 5.8, SaO2 low 85%. REM AHI 29.3    Review of Systems Constitutional:   No  weight loss, night sweats,  Fevers, chills, fatigue, or  lassitude.  HEENT:   No headaches,  Difficulty swallowing,  Tooth/dental problems, or  Sore throat,                No sneezing, itching, ear ache, nasal congestion, post nasal drip,   CV:  No chest pain,  Orthopnea, PND, swelling in lower extremities, anasarca, dizziness, palpitations, syncope.   GI  No heartburn, indigestion, abdominal pain, nausea, vomiting, diarrhea, change in bowel habits, loss of appetite, bloody stools.   Resp: No chest wall deformity  Skin: no rash or lesions.  GU: no dysuria, change in color of urine, no urgency or frequency.  No flank pain, no hematuria   MS:  No joint pain or swelling.  No decreased range of motion.  No back pain.  Psych:  No change in mood or affect. No depression or anxiety.  No memory loss.         Objective:   Physical Exam  GEN: A/Ox3; pleasant , NAD, obese   HEENT:  [place]/AT,  EACs-clear, TMs-wnl, NOSE-clear, THROAT-clear, no lesions, no postnasal drip or exudate noted.   NECK:  Supple w/ fair ROM; no JVD; normal carotid impulses w/o bruits; no  thyromegaly or nodules palpated; no lymphadenopathy.  RESP  Decreased BS in bases no accessory muscle use, no dullness to percussion  CARD:  RRR, no m/r/g  , no peripheral edema, pulses intact, no cyanosis or clubbing.  GI:   Soft & nt; nml bowel sounds; no organomegaly or masses detected.  Musco: Warm bil, no deformities or joint swelling noted.   Neuro: alert, no focal deficits noted.    Skin: Warm, no lesions or rashes        Assessment & Plan:

## 2014-09-09 NOTE — Progress Notes (Signed)
Pre visit review using our clinic review tool, if applicable. No additional management support is needed unless otherwise documented below in the visit note. 

## 2014-09-09 NOTE — Patient Instructions (Signed)
Continue with CPAP At bedtime   Keep up good work .  follow up Dr. Halford Chessman  As planned and As needed   Please contact office for sooner follow up if symptoms do not improve or worsen or seek emergency care

## 2014-09-09 NOTE — Patient Instructions (Signed)
We are not sure about doctors in [place] but recommend asking people that you know in [place] if they have a doctor that they like.   Call us if you have problems or questions before you are able to get in with them.  We will not need to check your blood work today.

## 2014-09-09 NOTE — Progress Notes (Signed)
   Subjective:    Patient ID: Cynthia Kirk, female    DOB: [birth year], 63 y.o.   MRN: [id number]  HPI The patient is a 63 YO female who is coming in to establish care today. She has PMH of B12 deficiency, DM type 2, GERD, HTN, OSA, obesity, hx sarcoid. She is actually wanting to have a doctor in [place] as she and her husband recently moved there. She is not having any acute problems except she occasionally get irritation in her butt crease. She denies worsening SOB, chest pains, abdominal pain.   Review of Systems  Constitutional: Negative for fever, activity change, appetite change and fatigue.  Respiratory: Negative for cough, chest tightness, shortness of breath and wheezing.   Cardiovascular: Negative for chest pain, palpitations and leg swelling.  Gastrointestinal: Negative for abdominal pain, diarrhea, constipation and abdominal distention.  Neurological: Negative.   Psychiatric/Behavioral: Negative.       Objective:   Physical Exam  Vitals reviewed. Constitutional: She is oriented to person, place, and time. She appears well-developed and well-nourished. No distress.  HENT:  Head: Normocephalic and atraumatic.  Eyes: EOM are normal.  Neck: Normal range of motion.  Cardiovascular: Normal rate and regular rhythm.   Pulmonary/Chest: Effort normal and breath sounds normal.  Abdominal: Soft. Bowel sounds are normal.  Neurological: She is alert and oriented to person, place, and time. Coordination normal.  Skin: Skin is warm and dry.   Filed Vitals:   09/09/14 1610  BP: 122/64  Pulse: 69  Temp: 97.7 F (36.5 C)  TempSrc: Oral  Resp: 14  Height: 5\' 4"  (1.626 m)  Weight: 250 lb 12.8 oz (113.762 kg)  SpO2: 97%      Assessment & Plan:

## 2014-09-09 NOTE — Assessment & Plan Note (Signed)
Without flare  Recent CT w/ no change  PFT to be done on return follow up

## 2014-09-10 DIAGNOSIS — E1151 Type 2 diabetes mellitus with diabetic peripheral angiopathy without gangrene: Secondary | ICD-10-CM | POA: Diagnosis not present

## 2014-09-10 DIAGNOSIS — I739 Peripheral vascular disease, unspecified: Secondary | ICD-10-CM | POA: Diagnosis not present

## 2014-09-10 DIAGNOSIS — M79609 Pain in unspecified limb: Secondary | ICD-10-CM | POA: Diagnosis not present

## 2014-09-10 DIAGNOSIS — B351 Tinea unguium: Secondary | ICD-10-CM | POA: Diagnosis not present

## 2014-09-10 DIAGNOSIS — L603 Nail dystrophy: Secondary | ICD-10-CM | POA: Diagnosis not present

## 2014-09-10 NOTE — Assessment & Plan Note (Signed)
BP good today, no change to regimen. Continue bystolic and benicar.

## 2014-09-10 NOTE — Assessment & Plan Note (Signed)
Really more pre-diabetes at this time. Last HgA1c not in diabetic range. On ACE-I

## 2014-09-10 NOTE — Assessment & Plan Note (Signed)
Recent CT this year of lungs stable from prior in 2011.

## 2014-09-10 NOTE — Assessment & Plan Note (Signed)
She is still taking hormone pills and does not seem to have a reason for usage, no hot flashes. Have advised her to stop usage and see if see has symptoms.

## 2014-09-10 NOTE — Progress Notes (Signed)
Reviewed and agree with assessment/plan. 

## 2014-09-10 NOTE — Assessment & Plan Note (Signed)
Takes shots monthly.

## 2014-09-19 ENCOUNTER — Telehealth: Payer: Self-pay | Admitting: Pulmonary Disease

## 2014-09-19 NOTE — Telephone Encounter (Signed)
Called and spoke to pt. Pt stated she needs [place] to fax the OV to [facility] for insurance purposes.  Called and spoke to [place] with [facility]. She confirmed the OV note is all that is need to provide proof to insurance that pt is using CPAP and seeing provider for her sleep apnea.  OV note successfully sent to [phone] Pt aware.  Nothing further needed.

## 2014-09-28 ENCOUNTER — Other Ambulatory Visit: Payer: Self-pay | Admitting: Internal Medicine

## 2014-10-03 ENCOUNTER — Telehealth: Payer: Self-pay | Admitting: Pulmonary Disease

## 2014-10-03 NOTE — Telephone Encounter (Signed)
Per 10/15/ phone note: Maurice March, RN at 09/19/2014 5:49 PM     Status: Signed        Called and spoke to pt. Pt stated she needs [place] to fax the OV to [facility] for insurance purposes.  Called and spoke to [place] with [facility]. She confirmed the OV note is all that is need to provide proof to insurance that pt is using CPAP and seeing provider for her sleep apnea.  OV note successfully sent to [phone]  Pt aware.  Nothing further needed.   --  Sent staff message to [facility] to see why pt is not able to get her supplies. Will await call back. Pt aware we are working on this

## 2014-10-03 NOTE — Telephone Encounter (Signed)
Per Melissa with [facility]: I've pulled the note and faxed it directly to the person needing it. ---  I called made aware of above. If she does not hear from [facility] by next week she is going to call us.

## 2014-10-07 ENCOUNTER — Encounter: Payer: Self-pay | Admitting: Adult Health

## 2014-10-10 DIAGNOSIS — M549 Dorsalgia, unspecified: Secondary | ICD-10-CM | POA: Diagnosis not present

## 2014-10-15 DIAGNOSIS — M549 Dorsalgia, unspecified: Secondary | ICD-10-CM | POA: Diagnosis not present

## 2014-10-15 DIAGNOSIS — M4806 Spinal stenosis, lumbar region: Secondary | ICD-10-CM | POA: Diagnosis not present

## 2014-10-18 ENCOUNTER — Other Ambulatory Visit: Payer: Self-pay | Admitting: Pulmonary Disease

## 2014-10-18 ENCOUNTER — Other Ambulatory Visit: Payer: Self-pay | Admitting: Internal Medicine

## 2014-10-24 ENCOUNTER — Telehealth: Payer: Self-pay | Admitting: Pulmonary Disease

## 2014-10-24 NOTE — Telephone Encounter (Signed)
Please advise if okay to overbook this pt on 12/11 after a 3 pm PFT appt Thanks!

## 2014-10-24 NOTE — Telephone Encounter (Signed)
Okay to double book visit. 

## 2014-10-25 NOTE — Telephone Encounter (Signed)
Called and spoke to pt. Informed pt that she is currently scheduled for 3p PFT and 4:15 OV with VS. Pt verbalized understanding and stated she will keep this appt. Nothing further needed.

## 2014-10-28 ENCOUNTER — Encounter (HOSPITAL_COMMUNITY): Payer: Medicare Other

## 2014-10-28 ENCOUNTER — Ambulatory Visit: Payer: Medicare Other | Admitting: Pulmonary Disease

## 2014-11-15 ENCOUNTER — Ambulatory Visit (INDEPENDENT_AMBULATORY_CARE_PROVIDER_SITE_OTHER): Payer: Medicare Other | Admitting: Pulmonary Disease

## 2014-11-15 ENCOUNTER — Encounter: Payer: Self-pay | Admitting: Pulmonary Disease

## 2014-11-15 VITALS — BP 108/70 | HR 76 | Ht 64.0 in | Wt 246.0 lb

## 2014-11-15 DIAGNOSIS — Z9989 Dependence on other enabling machines and devices: Secondary | ICD-10-CM

## 2014-11-15 DIAGNOSIS — G4733 Obstructive sleep apnea (adult) (pediatric): Secondary | ICD-10-CM | POA: Diagnosis not present

## 2014-11-15 DIAGNOSIS — D860 Sarcoidosis of lung: Secondary | ICD-10-CM

## 2014-11-15 DIAGNOSIS — Z23 Encounter for immunization: Secondary | ICD-10-CM | POA: Diagnosis not present

## 2014-11-15 DIAGNOSIS — J4530 Mild persistent asthma, uncomplicated: Secondary | ICD-10-CM

## 2014-11-15 LAB — PULMONARY FUNCTION TEST
DL/VA % pred: 93 %
DL/VA: 4.49 ml/min/mmHg/L
DLCO unc % pred: 74 %
DLCO unc: 18.01 ml/min/mmHg
FEF 25-75 Post: 1.56 L/sec
FEF 25-75 Pre: 1.31 L/sec
FEF2575-%Change-Post: 19 %
FEF2575-%Pred-Post: 80 %
FEF2575-%Pred-Pre: 67 %
FEV1-%Change-Post: 3 %
FEV1-%Pred-Post: 87 %
FEV1-%Pred-Pre: 84 %
FEV1-Post: 1.76 L
FEV1-Pre: 1.7 L
FEV1FVC-%Change-Post: 0 %
FEV1FVC-%Pred-Pre: 97 %
FEV6-%Change-Post: 3 %
FEV6-%Pred-Post: 91 %
FEV6-%Pred-Pre: 88 %
FEV6-Post: 2.26 L
FEV6-Pre: 2.19 L
FEV6FVC-%Change-Post: 0 %
FEV6FVC-%Pred-Post: 103 %
FEV6FVC-%Pred-Pre: 103 %
FVC-%Change-Post: 2 %
FVC-%Pred-Post: 88 %
FVC-%Pred-Pre: 86 %
FVC-Post: 2.27 L
FVC-Pre: 2.21 L
Post FEV1/FVC ratio: 77 %
Post FEV6/FVC ratio: 100 %
Pre FEV1/FVC ratio: 77 %
Pre FEV6/FVC Ratio: 99 %
RV % pred: 41 %
RV: 0.86 L
TLC % pred: 63 %
TLC: 3.19 L

## 2014-11-15 MED ORDER — MONTELUKAST SODIUM 10 MG PO TABS: ORAL_TABLET | ORAL | Status: DC

## 2014-11-15 NOTE — Progress Notes (Signed)
Chief Complaint  Patient presents with  . Follow-up    Review PFT. Requests PNA vaccine today.     History of Present Illness: Cynthia Kirk is a 63 y.o. female with Asthma, Sarcoidosis, OSA, Rhinitis, and Hoarseness.  She has been doing well with CPAP.  She sleeps for about 6 to 10 hours per night.  She uses her CPAP the entire time she is asleep.  She has a nasal mask and this fits well.  She is not having any trouble with her breathing.  She denies cough, wheeze, sputum, or chest pain.  Her PFT's today showed normal spirometry, mild restriction, and mild diffusion defect.  TESTS: Spirometry 06/30/09 >> FEV1 1.69(87%), FVC 2.17(83%), FEV1% 78 CPAP 12/25/12 to 01/23/13 >> Used on 20 of 30 nights with average 3 hrs 51 min. Average AHI 0.8 with CPAP 9 cm H2O. Minimal airleak. PSG 03/06/14 >> AHI 1.9, REM AHI 26.7, SaO2 low 80%. PSG 05/28/14 >> AHI 5.8, SaO2 low 85%. REM AHI 29.3 CT chest 07/17/14 >> upper lobe predominant BTX, GGO, nodularity no change since 2011 PFT 11/15/14 >> FEV1 1.76 (87%), FEV1% 77, TLC 3.19 (63%), DLCO 74%, no BD  PMHx >> GERD, hiatal hernia, Anemia, Anxiety, Chronic back pain  PSHx, Medications, Allergies, Fhx, Shx reviewed.  Physical Exam: Blood pressure 108/70, pulse 76, height 5\' 4"  (1.626 m), weight 246 lb (111.585 kg), last menstrual period 02/14/2014, SpO2 100 %. Body mass index is 42.21 kg/(m^2).  General - No distress ENT - No sinus tenderness, no oral exudate, no LAN Cardiac - s1s2 regular, no murmur Chest - No wheeze/rales/dullness Back - No focal tenderness Abd - Soft, non-tender Ext - No edema Neuro - Normal strength Skin - No rashes Psych - normal mood, and behavior   Assessment/Plan:  Obstructive sleep apnea >> she is compliant with therapy and reports benefit from CPAP. Plan: - continue CPAP 9 cm H2O  Asthma. Plan: - continue asmanex, singulair, and prn proair.  Pulmonary sarcoidosis >> stable clinically and on CT chest from  August 2015. Plan: - monitor clinically  Vaccinations.  She had pneumovax in April 2009. Plan: - will give prevnar today - she will need repeat pneumovax in January 2017   Chesley Mires, MD [place] [facility]/Critical Care/Sleep Pager:  [phone] 11/15/2014, 4:40 PM

## 2014-11-15 NOTE — Patient Instructions (Signed)
Prevnar shot today  Follow up in 6 months 

## 2014-11-15 NOTE — Progress Notes (Signed)
PFT done today. 

## 2014-11-19 DIAGNOSIS — M545 Low back pain: Secondary | ICD-10-CM | POA: Diagnosis not present

## 2014-11-19 DIAGNOSIS — Z6841 Body Mass Index (BMI) 40.0 and over, adult: Secondary | ICD-10-CM | POA: Diagnosis not present

## 2014-11-25 ENCOUNTER — Telehealth: Payer: Self-pay | Admitting: *Deleted

## 2014-11-25 DIAGNOSIS — R0602 Shortness of breath: Secondary | ICD-10-CM | POA: Diagnosis not present

## 2014-11-25 DIAGNOSIS — M5441 Lumbago with sciatica, right side: Secondary | ICD-10-CM | POA: Diagnosis not present

## 2014-11-25 DIAGNOSIS — N200 Calculus of kidney: Secondary | ICD-10-CM | POA: Diagnosis not present

## 2014-11-25 DIAGNOSIS — D869 Sarcoidosis, unspecified: Secondary | ICD-10-CM | POA: Diagnosis not present

## 2014-11-25 DIAGNOSIS — R05 Cough: Secondary | ICD-10-CM | POA: Diagnosis not present

## 2014-11-25 DIAGNOSIS — G8929 Other chronic pain: Secondary | ICD-10-CM | POA: Diagnosis not present

## 2014-11-25 DIAGNOSIS — J189 Pneumonia, unspecified organism: Secondary | ICD-10-CM | POA: Diagnosis not present

## 2014-11-25 DIAGNOSIS — Z794 Long term (current) use of insulin: Secondary | ICD-10-CM | POA: Diagnosis not present

## 2014-11-25 DIAGNOSIS — R1011 Right upper quadrant pain: Secondary | ICD-10-CM | POA: Diagnosis not present

## 2014-11-25 DIAGNOSIS — E119 Type 2 diabetes mellitus without complications: Secondary | ICD-10-CM | POA: Diagnosis not present

## 2014-11-25 DIAGNOSIS — K819 Cholecystitis, unspecified: Secondary | ICD-10-CM | POA: Diagnosis not present

## 2014-11-25 DIAGNOSIS — I272 Other secondary pulmonary hypertension: Secondary | ICD-10-CM | POA: Diagnosis not present

## 2014-11-25 DIAGNOSIS — R10811 Right upper quadrant abdominal tenderness: Secondary | ICD-10-CM | POA: Diagnosis not present

## 2014-11-25 DIAGNOSIS — I10 Essential (primary) hypertension: Secondary | ICD-10-CM | POA: Diagnosis not present

## 2014-11-25 NOTE — Telephone Encounter (Signed)
[facility] - Client [facility] Patient Name: [facility] Gender: Female DOB: Apr 14, 1951 Age: 63 Y 15 M 9 D Return Phone Number: [phone] (Primary), [phone] (Secondary) Address: [address] City/State/Zip: [place] [address] Client [facility] - Client Client Site [facility] Day Physician Gwendolyn Grant Contact Type Call Call Type Triage / Clinical Relationship To Patient Self Return Phone Number [phone] (Primary) Chief Complaint Nasal Congestion Initial Comment caller states she has pain in her right leg - has had for several months, her left big toe is bothering her- she also has flu like sx - congestion, sore throat and cough [place] PreDisposition InappropriateToAsk Nurse Assessment Nurse: Gretta Cool, RN, Byrd Hesselbach Date/Time (Eastern Time): 11/25/2014 10:06:44 AM Confirm and document reason for call. If symptomatic, describe symptoms. ---caller states she has pain in her right leg - has had for several months, her left big toe is bothering her- she also has flu like sx - congestion, sore throat and cough. Flu sx started last Tuesday. Has fever at night, did not take temp. With pain in leg, sometimes has difficulty walking. Had back surgery last year. Pain has gotten worse and has pain in right side. Has the patient traveled out of the country within the last 30 days? ---No Does the patient require triage? ---Yes Related visit to physician within the last 2 weeks? ---No Does the PT have any chronic conditions? (i.e. diabetes, asthma, etc.) ---Yes List chronic conditions. ---asthma, bronchitis Guidelines Guideline Title Affirmed Question Affirmed Notes Nurse Date/Time (Eastern Time) Flank Pain [1] SEVERE pain (e.g., excruciating, scale 8-10) AND [2] present > 1 hour Gretta Cool, [place] 11/25/2014 10:08:46 AM Disp. Time  [place]il[place] Ghazi Time) Disposition Final User 11/25/2014 10:10:58 AM Go to ED Now Yes Gretta Cool, RN, Byrd Hesselbach PLEASE NOTE: All timestamps contained within this report are represented as [place] Standard Time. CONFIDENTIALTY NOTICE: This fax transmission is intended only for the addressee. It contains information that is legally privileged, confidential or otherwise protected from use or disclosure. If you are not the intended recipient, you are strictly prohibited from reviewing, disclosing, copying using or disseminating any of this information or taking any action in reliance on or regarding this information. If you have received this fax in error, please notify us immediately by telephone so that we can arrange for its return to [place]. Phone: [phone], Toll-Free: [phone], Fax: [phone] Page: 2 of 2 Call Id: [id number] Caller Understands: Yes Disagree/Comply: Comply Care Advice Given Per Guideline GO TO ED NOW: You need to be seen in the Emergency Department. Go to the ER at ___________ [place] now. Drive carefully. DRIVING: Another adult should drive. CARE ADVICE given per Flank Pain (Adult) guideline. After Care Instructions Given Call Event Type User Date / Time Description Referrals GO TO FACILITY OTHER - SPECIFY

## 2014-11-26 DIAGNOSIS — M5441 Lumbago with sciatica, right side: Secondary | ICD-10-CM | POA: Diagnosis not present

## 2014-11-26 DIAGNOSIS — R10811 Right upper quadrant abdominal tenderness: Secondary | ICD-10-CM | POA: Diagnosis not present

## 2014-11-26 DIAGNOSIS — R05 Cough: Secondary | ICD-10-CM | POA: Diagnosis not present

## 2014-11-26 DIAGNOSIS — J189 Pneumonia, unspecified organism: Secondary | ICD-10-CM | POA: Diagnosis not present

## 2014-11-28 ENCOUNTER — Encounter: Payer: Self-pay | Admitting: Internal Medicine

## 2014-11-28 ENCOUNTER — Ambulatory Visit (INDEPENDENT_AMBULATORY_CARE_PROVIDER_SITE_OTHER)
Admission: RE | Admit: 2014-11-28 | Discharge: 2014-11-28 | Disposition: A | Discharge status: Home / Self Care | Payer: Medicare Other | Source: Ambulatory Visit | Attending: Internal Medicine | Admitting: Internal Medicine

## 2014-11-28 ENCOUNTER — Ambulatory Visit (INDEPENDENT_AMBULATORY_CARE_PROVIDER_SITE_OTHER): Payer: Medicare Other | Admitting: Internal Medicine

## 2014-11-28 VITALS — BP 114/64 | HR 67 | Temp 97.9°F | Ht 64.0 in | Wt 250.1 lb

## 2014-11-28 DIAGNOSIS — I10 Essential (primary) hypertension: Secondary | ICD-10-CM | POA: Diagnosis not present

## 2014-11-28 DIAGNOSIS — E119 Type 2 diabetes mellitus without complications: Secondary | ICD-10-CM

## 2014-11-28 DIAGNOSIS — J189 Pneumonia, unspecified organism: Secondary | ICD-10-CM | POA: Diagnosis not present

## 2014-11-28 DIAGNOSIS — R05 Cough: Secondary | ICD-10-CM | POA: Diagnosis not present

## 2014-11-28 DIAGNOSIS — R0989 Other specified symptoms and signs involving the circulatory and respiratory systems: Secondary | ICD-10-CM | POA: Diagnosis not present

## 2014-11-28 MED ORDER — LEVOFLOXACIN 250 MG PO TABS: 250.0000 mg | ORAL_TABLET | Freq: Every day | ORAL | Status: DC

## 2014-11-28 MED ORDER — AZITHROMYCIN 250 MG PO TABS: ORAL_TABLET | ORAL | Status: DC

## 2014-11-28 MED ORDER — CEFTRIAXONE SODIUM 1 G IJ SOLR
1.0000 g | Freq: Once | INTRAMUSCULAR | Status: AC
  Administered 2014-11-28: 1 g via INTRAMUSCULAR

## 2014-11-28 MED ORDER — HYDROCODONE-HOMATROPINE 5-1.5 MG/5ML PO SYRP: 5.0000 mL | ORAL_SOLUTION | Freq: Four times a day (QID) | ORAL | Status: DC | PRN

## 2014-11-28 NOTE — Progress Notes (Signed)
Pre visit review using our clinic review tool, if applicable. No additional management support is needed unless otherwise documented below in the visit note. 

## 2014-11-28 NOTE — Progress Notes (Signed)
Subjective:    Patient ID: Cynthia Kirk, female    DOB: 04/30/1951, 63 y.o.   MRN: [id number]  HPI here with 2 wks onset illness with feverish, nonprod cough but worse freq in last 3 days since seen in ER at [facility], reportedly had CT chest , dx with PNA on top of sarcoid, has some worsening sob/doe today.  Pt also with right side chest pain, side pain, ? wheezing, orthopnea, PND, increased LE swelling, palpitations, dizziness or syncope.  Reportedly neg liver and kidney tests ok at ER. Tx with erythromycin but had GI upset in past so did not fill her rx..  Pt denies polydipsia, polyuria,   Past Medical History  Diagnosis Date  . Hiatal hernia   . Morbid obesity 2010  . Esophageal reflux   . Allergic rhinitis, cause unspecified   . Unspecified asthma(493.90)   . Sarcoidosis 1996    @ [employer]  . Iron deficiency anemia   . Syncope     ?? Syncope ??  . Ejection fraction     EF 45-50%, echo, October 27, 2011  . H/O menorrhagia 2011  . Endometrial mass 2011  . Urinary incontinence, mixed 2011  . Simple endometrial hyperplasia 2011  . Herpes simplex type 2 infection complicating pregnancy   . ASCUS with positive high risk HPV   . VIN II (vulvar intraepithelial neoplasia II)   . Fibroid   . Anxiety   . H/O Clostridium difficile infection 03/2010  . Adenomatous colon polyp   . Blood transfusion 1976    Due to ectopic pregnancy  . Abnormal Pap smear 2008  . Obstructive sleep apnea (adult) (pediatric)     cpap  . Pneumonia 2005; 2007; 2009; 2011  . Type 2 diabetes, diet controlled   . Spinal stenosis, lumbar     s/p decompression laminectomy 09/2013  . Chronic back pain   . Panic attacks    Past Surgical History  Procedure Laterality Date  . Vulvectomy  ~ 2007    "for cancerous cells" (09/05/2013)  . Ectopic pregnancy surgery  1976; ~ 1978  . Urethral sling  2011  . Dilation and curettage of uterus  2007  . Posterior laminectomy / decompression lumbar spine  09/05/2013  .  Thoracic laminectomy  09/05/2013  . Posterior lumbar fusion  09/05/2013  . Hysteroscopy diagnostic  2011  . Colposcopy vulva w/ biopsy      "I've had maybe 3" (09/05/2013)  . Maximum access (mas)posterior lumbar interbody fusion (plif) 1 level N/A 09/05/2013    Procedure: Lumbar four-five Maximum Access Surgery  Posterior lumbar interbody fusion;  Surgeon: Eustace Moore, MD;  Location: [place] [facility];  Service: Neurosurgery;  Laterality: N/A;  Lumbar four-five Maximum Access Surgery  Posterior lumbar interbody fusion  . Lumbar laminectomy/decompression microdiscectomy N/A 09/05/2013    Procedure: Thoracic eleven-twelve Thoracic Laminectomy;  Surgeon: Eustace Moore, MD;  Location: [place] [facility];  Service: Neurosurgery;  Laterality: N/A;  Thoracic eleven-twelve Thoracic Laminectomy    reports that she has never smoked. She has never used smokeless tobacco. She reports that she drinks alcohol. She reports that she does not use illicit drugs. family history includes Asthma in her daughter and son; Cancer in her paternal uncle; Diabetes in her father and other; Heart attack in her father and sister; High Cholesterol in her brother; Hypertension in her father; Kidney disease in her other and sister; Lung cancer in her maternal uncle; Migraines in her father; Osteoporosis in her sister  and sister; Seizures in her father. Allergies  Allergen Reactions  . Aspirin     REACTION: hives  . Latex     REACTION: rash   Current Outpatient Prescriptions on File Prior to Visit  Medication Sig Dispense Refill  . albuterol (PROVENTIL) (2.5 MG/3ML) 0.083% nebulizer solution Take 3 mLs (2.5 mg total) by nebulization every 6 (six) hours as needed for shortness of breath. 75 mL 5  . ALPRAZolam (XANAX) 1 MG tablet Take 1 tablet (1 mg total) by mouth 2 (two) times daily as needed for sleep or anxiety. 60 tablet 5  . [name] HCT 40-25 MG per tablet TAKE 1 TABLET EVERY DAY 30 tablet 5  . Cholecalciferol (VITAMIN D) 2000 UNITS  tablet Take 1,000 Units by mouth daily.     . Coenzyme Q10 (CO Q 10) 100 MG CAPS Take 1 capsule by mouth daily.    . cyanocobalamin (,VITAMIN B-12,) 1000 MCG/ML injection Inject 1 mL (1,000 mcg total) into the muscle every 30 (thirty) days. 10 mL 1  . DEXILANT 60 MG capsule TAKE ONE CAPSULE BY MOUTH EVERY DAY 30 capsule 11  . hydroquinone 4 % cream Apply topically 2 (two) times daily. 28.35 g 0  . medroxyPROGESTERone (PROVERA) 10 MG tablet Take 10 mg by mouth. Every 3 months.    . methocarbamol (ROBAXIN) 500 MG tablet Take 1 tablet (500 mg total) by mouth every 6 (six) hours as needed. 60 tablet 1  . mometasone (ASMANEX 60 METERED DOSES) 220 MCG/INH inhaler Inhale 2 puffs into the lungs daily. 1 Inhaler 12  . montelukast (SINGULAIR) 10 MG tablet TAKE 1 TABLET (10 MG TOTAL) BY MOUTH AT BEDTIME. 30 tablet 6  . nebivolol (BYSTOLIC) 5 MG tablet TAKE 1 TABLET BY MOUTH DAILY 30 tablet 9  . polyethylene glycol powder (GLYCOLAX/MIRALAX) powder TAKE 17 G BY MOUTH 2 (TWO) TIMES DAILY. 527 g 11  . PROAIR HFA 108 (90 BASE) MCG/ACT inhaler INHALE 2 PUFFS INTO THE LUNGS 4 TIMES DAILY 25.5 each 1  . ranitidine (ZANTAC) 300 MG tablet TAKE 1 TABLET (300 MG TOTAL) BY MOUTH AT BEDTIME. 30 tablet 11  . solifenacin (VESICARE) 10 MG tablet Take 10 mg by mouth daily. Total- 15mg  daily    . SYRINGE/NEEDLE, DISP, 1 ML (B-D SYRINGE/NEEDLE 1CC/25GX5/8) 25G X 5/8" 1 ML MISC Use 1 monthly to administer her b12 injection 12 each 0   No current facility-administered medications on file prior to visit.   Review of Systems  Constitutional: Negative for unusual diaphoresis or other sweats  HENT: Negative for ringing in ear Eyes: Negative for double vision or worsening visual disturbance.  Respiratory: Negative for choking and stridor.   Gastrointestinal: Negative for vomiting or other signifcant bowel change Genitourinary: Negative for hematuria or decreased urine volume.  Musculoskeletal: Negative for other MSK pain or  swelling Skin: Negative for color change and worsening wound.  Neurological: Negative for tremors and numbness other than noted  Psychiatric/Behavioral: Negative for decreased concentration or agitation other than above       Objective:   Physical Exam BP 114/64 mmHg  Pulse 67  Temp(Src) 97.9 F (36.6 C) (Oral)  Ht 5\' 4"  (1.626 m)  Wt 250 lb 2 oz (113.456 kg)  BMI 42.91 kg/m2  SpO2 97%  LMP 02/14/2014 VS noted,  Constitutional: Pt appears well-developed, well-nourished.  HENT: Head: NCAT.  Right Ear: External ear normal.  Left Ear: External ear normal.  Eyes: . Pupils are equal, round, and reactive to light. Conjunctivae and  EOM are normal Neck: Normal range of motion. Neck supple.  Cardiovascular: Normal rate and regular rhythm.   Pulmonary/Chest: Effort normal and breath sounds without rales or wheezing, decreased BS bilat  Abd:  Soft, NT, ND, + BS Neurological: Pt is alert. Not confused , motor grossly intact Skin: Skin is warm. No rash Psychiatric: Pt behavior is normal. No agitation.     Assessment & Plan:

## 2014-11-28 NOTE — Patient Instructions (Addendum)
You had the antibiotic shot today (rocephin)  Please take all new medication as prescribed - the 2 pill antibiotics, and cough medicine  Please plan to follow up with Dr Halford Chessman in 7-10 days ( or sooner if needed) - pt to call (or I can try to assist if needed)  Please continue all other medications as before, and refills have been done if requested.  Please have the pharmacy call with any other refills you may need.  Please keep your appointments with your specialists as you may have planned  Please go to the XRAY Department in the Basement (go straight as you get off the elevator) for the x-ray testing  You will be contacted by phone if any changes need to be made immediately.  Otherwise, you will receive a letter about your results with an explanation, but please check with MyChart first.  Please remember to sign up for MyChart if you have not done so, as this will be important to you in the future with finding out test results, communicating by private email, and scheduling acute appointments online when needed.

## 2014-12-01 NOTE — Assessment & Plan Note (Signed)
stable overall by history and exam, recent data reviewed with pt, and pt to continue medical treatment as before,  to f/u any worsening symptoms or concerns Lab Results  Component Value Date   HGBA1C 5.6 03/04/2014

## 2014-12-01 NOTE — Assessment & Plan Note (Signed)
stable overall by history and exam, recent data reviewed with pt, and pt to continue medical treatment as before,  to f/u any worsening symptoms or concerns BP Readings from Last 3 Encounters:  11/28/14 114/64  11/15/14 108/70  09/09/14 118/80

## 2014-12-01 NOTE — Assessment & Plan Note (Signed)
By report, for f/u cxr. Mild to mod, for antibx course,  to f/u any worsening symptoms or concerns

## 2014-12-02 ENCOUNTER — Other Ambulatory Visit: Payer: Self-pay | Admitting: Pulmonary Disease

## 2014-12-04 ENCOUNTER — Telehealth: Payer: Self-pay | Admitting: Pulmonary Disease

## 2014-12-04 NOTE — Telephone Encounter (Signed)
Pt calling to reschedule appt with VS -- pt had CT and CXR 11/25/14 and it showed possible worsening of Sarcoid, pt was advised to follow up with Dr Halford Chessman to review scans with our scans to see if there are any changes noted. Appt rescheduled - Pt is going to get scans from [facility] and bring them to [place] with TP 12/12/14.  Nothing further needed.

## 2014-12-05 ENCOUNTER — Other Ambulatory Visit: Payer: Self-pay | Admitting: Pulmonary Disease

## 2014-12-05 ENCOUNTER — Ambulatory Visit: Payer: Medicare Other | Admitting: Pulmonary Disease

## 2014-12-05 NOTE — Telephone Encounter (Signed)
VS please advise on refill; med not on patients current med list and appears to be on a different medication. Thanks.

## 2014-12-12 ENCOUNTER — Ambulatory Visit: Payer: Medicare Other | Admitting: Adult Health

## 2014-12-16 ENCOUNTER — Encounter: Payer: Self-pay | Admitting: Adult Health

## 2014-12-25 ENCOUNTER — Other Ambulatory Visit: Payer: Self-pay | Admitting: Pulmonary Disease

## 2014-12-25 MED ORDER — BECLOMETHASONE DIPROPIONATE 80 MCG/ACT IN AERS
INHALATION_SPRAY | RESPIRATORY_TRACT | Status: DC
Start: 2014-12-25 — End: 2015-07-09

## 2014-12-25 MED ORDER — BECLOMETHASONE DIPROPIONATE 80 MCG/ACT IN AERS
INHALATION_SPRAY | RESPIRATORY_TRACT | Status: DC
Start: 2014-12-25 — End: 2014-12-25

## 2015-01-19 ENCOUNTER — Other Ambulatory Visit: Payer: Self-pay | Admitting: Internal Medicine

## 2015-01-22 ENCOUNTER — Other Ambulatory Visit: Payer: Self-pay | Admitting: Internal Medicine

## 2015-01-22 ENCOUNTER — Telehealth: Payer: Self-pay

## 2015-01-22 NOTE — Telephone Encounter (Signed)
Called to attempt PA on Dexilant with [employer] and they will not cover unless patient has tried and failed at least two of the preferred medications. The preferred medications are omeprazole, esomeprazole and pantoprazole. Left a message for patient to return my call to discuss switching her to another medication.

## 2015-01-22 NOTE — Telephone Encounter (Signed)
PA for Bystolic sent to plan via cover my meds.

## 2015-01-23 NOTE — Telephone Encounter (Signed)
Informed patient that they will not cover Dexilant and we need to switch her to a preferred generic medication. Patient states she used to have [place] and it was switched by Medicare to [place] and she is not happy with them. She states she will call her insurance company and switch back. Told patient to call and let us know if she will continue with Dexilant or if we need to switch her to a preferred medication. Pt agreed and verbalized understanding.

## 2015-01-24 ENCOUNTER — Telehealth: Payer: Self-pay | Admitting: Internal Medicine

## 2015-01-24 NOTE — Telephone Encounter (Signed)
Patient paged on call MD  Having 2 weeks leg pain and neck pain - thinks is due to pulmonary sarcoid  rec Go to ER  If not, office to call and give her appt   Thanks  Dr. Brand Males, M.D., [facility].C.P Pulmonary and Critical Care Medicine Staff Physician [facility] Pager: [phone], If no answer or between  15:00h - 7:00h: call [phone]  01/24/2015 7:05 PM

## 2015-01-25 ENCOUNTER — Emergency Department (HOSPITAL_COMMUNITY): Payer: Medicare Other

## 2015-01-25 ENCOUNTER — Emergency Department (HOSPITAL_COMMUNITY)
Admission: EM | Admit: 2015-01-25 | Discharge: 2015-01-25 | Disposition: A | Discharge status: Home / Self Care | Payer: Medicare Other | Attending: Emergency Medicine | Admitting: Emergency Medicine

## 2015-01-25 ENCOUNTER — Encounter (HOSPITAL_COMMUNITY): Payer: Self-pay

## 2015-01-25 DIAGNOSIS — Z7951 Long term (current) use of inhaled steroids: Secondary | ICD-10-CM | POA: Insufficient documentation

## 2015-01-25 DIAGNOSIS — R079 Chest pain, unspecified: Secondary | ICD-10-CM

## 2015-01-25 DIAGNOSIS — Z8701 Personal history of pneumonia (recurrent): Secondary | ICD-10-CM | POA: Diagnosis not present

## 2015-01-25 DIAGNOSIS — Z862 Personal history of diseases of the blood and blood-forming organs and certain disorders involving the immune mechanism: Secondary | ICD-10-CM | POA: Insufficient documentation

## 2015-01-25 DIAGNOSIS — R0789 Other chest pain: Secondary | ICD-10-CM | POA: Diagnosis not present

## 2015-01-25 DIAGNOSIS — Z91040 Latex allergy status: Secondary | ICD-10-CM | POA: Diagnosis not present

## 2015-01-25 DIAGNOSIS — Z8742 Personal history of other diseases of the female genital tract: Secondary | ICD-10-CM | POA: Diagnosis not present

## 2015-01-25 DIAGNOSIS — J8410 Pulmonary fibrosis, unspecified: Secondary | ICD-10-CM | POA: Diagnosis not present

## 2015-01-25 DIAGNOSIS — K219 Gastro-esophageal reflux disease without esophagitis: Secondary | ICD-10-CM | POA: Insufficient documentation

## 2015-01-25 DIAGNOSIS — Z792 Long term (current) use of antibiotics: Secondary | ICD-10-CM | POA: Diagnosis not present

## 2015-01-25 DIAGNOSIS — M542 Cervicalgia: Secondary | ICD-10-CM | POA: Diagnosis not present

## 2015-01-25 DIAGNOSIS — M47812 Spondylosis without myelopathy or radiculopathy, cervical region: Secondary | ICD-10-CM | POA: Diagnosis not present

## 2015-01-25 DIAGNOSIS — Z86010 Personal history of colonic polyps: Secondary | ICD-10-CM | POA: Diagnosis not present

## 2015-01-25 DIAGNOSIS — F410 Panic disorder [episodic paroxysmal anxiety] without agoraphobia: Secondary | ICD-10-CM | POA: Insufficient documentation

## 2015-01-25 DIAGNOSIS — E669 Obesity, unspecified: Secondary | ICD-10-CM | POA: Diagnosis not present

## 2015-01-25 DIAGNOSIS — G8929 Other chronic pain: Secondary | ICD-10-CM | POA: Diagnosis not present

## 2015-01-25 DIAGNOSIS — J45909 Unspecified asthma, uncomplicated: Secondary | ICD-10-CM | POA: Insufficient documentation

## 2015-01-25 DIAGNOSIS — Z09 Encounter for follow-up examination after completed treatment for conditions other than malignant neoplasm: Secondary | ICD-10-CM

## 2015-01-25 DIAGNOSIS — R918 Other nonspecific abnormal finding of lung field: Secondary | ICD-10-CM | POA: Diagnosis not present

## 2015-01-25 DIAGNOSIS — Z79899 Other long term (current) drug therapy: Secondary | ICD-10-CM | POA: Insufficient documentation

## 2015-01-25 LAB — CBC WITH DIFFERENTIAL/PLATELET
Basophils Absolute: 0 10*3/uL (ref 0.0–0.1)
Basophils Relative: 0 % (ref 0–1)
Eosinophils Absolute: 0.1 10*3/uL (ref 0.0–0.7)
Eosinophils Relative: 2 % (ref 0–5)
HCT: 36.3 % (ref 36.0–46.0)
HCT: 34.8 % — ABNORMAL LOW (ref 36.0–46.0)
Hemoglobin: 11.8 g/dL — ABNORMAL LOW (ref 12.0–15.0)
Hemoglobin: 11.4 g/dL — ABNORMAL LOW (ref 12.0–15.0)
Lymphocytes Relative: 44 % (ref 12–46)
Lymphs Abs: 2.2 10*3/uL (ref 0.7–4.0)
MCH: 28.6 pg (ref 26.0–34.0)
MCH: 29.2 pg (ref 26.0–34.0)
MCHC: 32.5 g/dL (ref 30.0–36.0)
MCHC: 32.8 g/dL (ref 30.0–36.0)
MCV: 87.9 fL (ref 78.0–100.0)
MCV: 89 fL (ref 78.0–100.0)
Monocytes Absolute: 0.3 10*3/uL (ref 0.1–1.0)
Monocytes Relative: 7 % (ref 3–12)
Neutro Abs: 2.4 10*3/uL (ref 1.7–7.7)
Neutrophils Relative %: 47 % (ref 43–77)
Platelets: 223 10*3/uL (ref 150–400)
RBC: 4.13 MIL/uL (ref 3.87–5.11)
RBC: 3.91 MIL/uL (ref 3.87–5.11)
RDW: 14 % (ref 11.5–15.5)
RDW: 14 % (ref 11.5–15.5)
WBC: 5 10*3/uL (ref 4.0–10.5)

## 2015-01-25 LAB — COMPREHENSIVE METABOLIC PANEL
ALT: 9 U/L (ref 0–35)
AST: 20 U/L (ref 0–37)
Albumin: 3.5 g/dL (ref 3.5–5.2)
Alkaline Phosphatase: 99 U/L (ref 39–117)
Anion gap: 7 (ref 5–15)
BUN: 14 mg/dL (ref 6–23)
CO2: 28 mmol/L (ref 19–32)
Calcium: 9.1 mg/dL (ref 8.4–10.5)
Chloride: 102 mmol/L (ref 96–112)
Creatinine, Ser: 1.03 mg/dL (ref 0.50–1.10)
GFR calc Af Amer: 66 mL/min — ABNORMAL LOW (ref >90)
GFR calc non Af Amer: 57 mL/min — ABNORMAL LOW (ref >90)
Glucose, Bld: 90 mg/dL (ref 70–99)
Potassium: 4.3 mmol/L (ref 3.5–5.1)
Sodium: 137 mmol/L (ref 135–145)
Total Bilirubin: 0.8 mg/dL (ref 0.3–1.2)
Total Protein: 6.8 g/dL (ref 6.0–8.3)

## 2015-01-25 MED ORDER — HYDROMORPHONE HCL 2 MG/ML IJ SOLN: 4.0000 mg | Freq: Once | INTRAMUSCULAR | Status: DC

## 2015-01-25 MED ORDER — ONDANSETRON 4 MG PO TBDP
8.0000 mg | ORAL_TABLET | Freq: Once | ORAL | Status: AC
  Administered 2015-01-25: 8 mg via ORAL
  Filled 2015-01-25: qty 2

## 2015-01-25 MED ORDER — SODIUM CHLORIDE 0.9 % IV BOLUS (SEPSIS): 500.0000 mL | Freq: Once | INTRAVENOUS | Status: DC

## 2015-01-25 MED ORDER — HYDROMORPHONE HCL 1 MG/ML IJ SOLN
1.0000 mg | Freq: Once | INTRAMUSCULAR | Status: DC
  Filled 2015-01-25: qty 1

## 2015-01-25 MED ORDER — HYDROMORPHONE HCL 1 MG/ML PO LIQD: 2.0000 mg | Freq: Once | ORAL | Status: DC

## 2015-01-25 MED ORDER — HYDROMORPHONE HCL 2 MG/ML IJ SOLN
4.0000 mg | Freq: Once | INTRAMUSCULAR | Status: AC
Start: 2015-01-25 — End: 2015-01-25
  Administered 2015-01-25: 4 mg via INTRAMUSCULAR
  Filled 2015-01-25: qty 2

## 2015-01-25 MED ORDER — ONDANSETRON 8 MG PO TBDP: 8.0000 mg | ORAL_TABLET | Freq: Three times a day (TID) | ORAL | Status: DC | PRN

## 2015-01-25 NOTE — Discharge Instructions (Signed)

## 2015-01-25 NOTE — ED Notes (Signed)
Pt with left-sided neck pain that has been ongoing for about 2 weeks and has progressively gotten worse over the past week. Pt able to move neck although she endorses pain.  Pt reporting intermittent numbness and tingling to hands.  Pt also reporting right leg pain that has had a MRI and nerve conduction test done by orthopedist.  Pt sts they have not been able to find an answer to leg pain.

## 2015-01-25 NOTE — ED Notes (Signed)
Ria Comment RN at bedside attempting IV access with ultrasound.

## 2015-01-25 NOTE — ED Notes (Signed)
Pt transported to Xray. 

## 2015-01-25 NOTE — ED Provider Notes (Signed)
CSN: [id number]     Arrival date & time 01/25/15  1049 History   First MD Initiated Contact with Patient 01/25/15 1057     Chief Complaint  Patient presents with  . Neck Pain     (Consider location/radiation/quality/duration/timing/severity/associated sxs/prior Treatment) HPI 64 year old female presents today with right leg pain and left neck pain. Right leg pain has been present for several weeks and she has had workup including MRI and been seen by her orthopedic doctor for pain in her right calf and right thigh. She has had no known trauma. She also has pain in her left chest. This has been going on for 2 weeks. She points to the left supraclavicular region. She states it is somewhat sharp. And that is related to her sarcoidosis. She has not had any numbness, tingling, weakness, increased shortness of breath, or fever. She states that she is warm during the night when she wakes up and feels that she is having some chills. She believes she has had a subjective fever but has not taken it. She has been taking by mouth without difficulty. She has used multiple different things for pain control which she has not felt have work to include by mouth Dilaudid. Her pulmonary physicians office and was told to come to the ED as this might have to do with her sarcoidosis. Her sarcoid has not been active for approximately 15 years. She is not on any immunosuppressants or prednisone. She was seen in her primary care office the end of December for similar symptoms and was on a round of Zithromax and was also on Levaquin at some point. Past Medical History  Diagnosis Date  . Hiatal hernia   . Morbid obesity 2010  . Esophageal reflux   . Allergic rhinitis, cause unspecified   . Unspecified asthma(493.90)   . Sarcoidosis 1996    @ [employer]  . Iron deficiency anemia   . Syncope     ?? Syncope ??  . Ejection fraction     EF 45-50%, echo, October 27, 2011  . H/O menorrhagia 2011  . Endometrial mass 2011  .  Urinary incontinence, mixed 2011  . Simple endometrial hyperplasia 2011  . Herpes simplex type 2 infection complicating pregnancy   . ASCUS with positive high risk HPV   . VIN II (vulvar intraepithelial neoplasia II)   . Fibroid   . Anxiety   . H/O Clostridium difficile infection 03/2010  . Adenomatous colon polyp   . Blood transfusion 1976    Due to ectopic pregnancy  . Abnormal Pap smear 2008  . Obstructive sleep apnea (adult) (pediatric)     cpap  . Pneumonia 2005; 2007; 2009; 2011  . Type 2 diabetes, diet controlled   . Spinal stenosis, lumbar     s/p decompression laminectomy 09/2013  . Chronic back pain   . Panic attacks    Past Surgical History  Procedure Laterality Date  . Vulvectomy  ~ 2007    "for cancerous cells" (09/05/2013)  . Ectopic pregnancy surgery  1976; ~ 1978  . Urethral sling  2011  . Dilation and curettage of uterus  2007  . Posterior laminectomy / decompression lumbar spine  09/05/2013  . Thoracic laminectomy  09/05/2013  . Posterior lumbar fusion  09/05/2013  . Hysteroscopy diagnostic  2011  . Colposcopy vulva w/ biopsy      "I've had maybe 3" (09/05/2013)  . Maximum access (mas)posterior lumbar interbody fusion (plif) 1 level N/A 09/05/2013    Procedure:  Lumbar four-five Maximum Access Surgery  Posterior lumbar interbody fusion;  Surgeon: Eustace Moore, MD;  Location: [place] [facility];  Service: Neurosurgery;  Laterality: N/A;  Lumbar four-five Maximum Access Surgery  Posterior lumbar interbody fusion  . Lumbar laminectomy/decompression microdiscectomy N/A 09/05/2013    Procedure: Thoracic eleven-twelve Thoracic Laminectomy;  Surgeon: Eustace Moore, MD;  Location: [place] [facility];  Service: Neurosurgery;  Laterality: N/A;  Thoracic eleven-twelve Thoracic Laminectomy   Family History  Problem Relation Age of Onset  . Heart attack Father   . Hypertension Father   . Diabetes Father   . Seizures Father   . Migraines Father   . Kidney disease Sister   .  Osteoporosis Sister   . Heart attack Sister   . Asthma Daughter   . Asthma Son   . Osteoporosis Sister   . Lung cancer Maternal Uncle   . Cancer Paternal Uncle   . Kidney disease Other   . Diabetes Other   . High Cholesterol Brother     x2   History  Substance Use Topics  . Smoking status: Never Smoker   . Smokeless tobacco: Never Used  . Alcohol Use: Yes     Comment: 09/05/2013 "might have a glass of wine maybe q couple months"   OB History    Gravida Para Term Preterm AB TAB SAB Ectopic Multiple Living   8         2     Review of Systems  All other systems reviewed and are negative.      Allergies  Aspirin and Latex  Home Medications   Prior to Admission medications   Medication Sig Start Date End Date Taking? Authorizing Provider  albuterol (PROVENTIL) (2.5 MG/3ML) 0.083% nebulizer solution Take 3 mLs (2.5 mg total) by nebulization every 6 (six) hours as needed for shortness of breath. 07/15/14   Chesley Mires, MD  ALPRAZolam Duanne Moron) 1 MG tablet Take 1 tablet (1 mg total) by mouth 2 (two) times daily as needed for sleep or anxiety. 08/19/14   Rowe Clack, MD  azithromycin (ZITHROMAX Z-PAK) 250 MG tablet Use as directed 11/28/14   Biagio Borg, MD  beclomethasone (QVAR) 80 MCG/ACT inhaler INHALE 2 PUFFS INTO THE LUNGS 2 (TWO) TIMES DAILY. 12/25/14   Chesley Mires, MD  [name] HCT 40-25 MG per tablet TAKE 1 TABLET EVERY DAY 10/18/14   Rowe Clack, MD  BYSTOLIC 5 MG tablet TAKE 1 TABLET BY MOUTH DAILY 01/21/15   Rowe Clack, MD  Cholecalciferol (VITAMIN D) 2000 UNITS tablet Take 1,000 Units by mouth daily.     Historical Provider, MD  Coenzyme Q10 (CO Q 10) 100 MG CAPS Take 1 capsule by mouth daily.    Historical Provider, MD  cyanocobalamin (,VITAMIN B-12,) 1000 MCG/ML injection Inject 1 mL (1,000 mcg total) into the muscle every 30 (thirty) days. 03/04/14   Rowe Clack, MD  DEXILANT 60 MG capsule TAKE ONE CAPSULE BY MOUTH EVERY DAY    Ladene Artist,  MD  HYDROcodone-homatropine [facility]) 5-1.5 MG/5ML syrup Take 5 mLs by mouth every 6 (six) hours as needed for cough. 11/28/14   Biagio Borg, MD  hydroquinone 4 % cream Apply topically 2 (two) times daily. 05/06/14   Rowe Clack, MD  levofloxacin (LEVAQUIN) 250 MG tablet Take 1 tablet (250 mg total) by mouth daily. 11/28/14   Biagio Borg, MD  medroxyPROGESTERone (PROVERA) 10 MG tablet Take 10 mg by mouth. Every 3 months.  07/27/12   Delice Lesch, MD  methocarbamol (ROBAXIN) 500 MG tablet Take 1 tablet (500 mg total) by mouth every 6 (six) hours as needed. 09/11/13   Eustace Moore, MD  mometasone (ASMANEX 60 METERED DOSES) 220 MCG/INH inhaler Inhale 2 puffs into the lungs daily. 07/15/14   Chesley Mires, MD  montelukast (SINGULAIR) 10 MG tablet TAKE 1 TABLET (10 MG TOTAL) BY MOUTH AT BEDTIME. 11/15/14   Chesley Mires, MD  polyethylene glycol powder (GLYCOLAX/MIRALAX) powder TAKE 17 G BY MOUTH 2 (TWO) TIMES DAILY.    Ladene Artist, MD  PROAIR HFA 108 [phone] BASE) MCG/ACT inhaler INHALE 2 PUFFS INTO THE LUNGS 4 TIMES DAILY 10/18/14   Chesley Mires, MD  ranitidine (ZANTAC) 300 MG tablet TAKE 1 TABLET (300 MG TOTAL) BY MOUTH AT BEDTIME. 02/06/14   Ladene Artist, MD  solifenacin (VESICARE) 10 MG tablet Take 10 mg by mouth daily. Total- 15mg  daily    Historical Provider, MD  SYRINGE/NEEDLE, DISP, 1 ML (B-D SYRINGE/NEEDLE 1CC/25GX5/8) 25G X 5/8" 1 ML MISC Use 1 monthly to administer her b12 injection 01/30/13   Rowe Clack, MD   LMP 02/14/2014 Physical Exam  Constitutional: She is oriented to person, place, and time. She appears well-developed and well-nourished.  Obese  HENT:  Head: Normocephalic and atraumatic.  Right Ear: External ear normal.  Left Ear: External ear normal.  Nose: Nose normal.  Mouth/Throat: Oropharynx is clear and moist.  Eyes: Conjunctivae and EOM are normal. Pupils are equal, round, and reactive to light.  Neck: Normal range of motion. Neck supple.  Cardiovascular:  Normal rate and regular rhythm.   Pulmonary/Chest: Effort normal and breath sounds normal.    Abdominal: Soft. Bowel sounds are normal. There is no tenderness.  Musculoskeletal: Normal range of motion. She exhibits no edema or tenderness.  Neurological: She is alert and oriented to person, place, and time. She has normal reflexes.  Skin: Skin is warm and dry.  Psychiatric: She has a normal mood and affect. Her behavior is normal. Judgment and thought content normal.  Nursing note and vitals reviewed.   ED Course  Procedures (including critical care time) Labs Review Labs Reviewed - No data to display  Imaging Review Dg Chest 2 View  01/25/2015   CLINICAL DATA:  Initial evaluation for chest pain for 2 days, history of sarcoidosis asthma bronchitis hypertension and diabetes  EXAM: CHEST  2 VIEW  COMPARISON:  11/28/2014  FINDINGS: The heart size and vascular pattern are normal. Mild bilateral hilar prominence is stable. Mild to moderate upper lobe bilateral interstitial lung disease is stable. No consolidation or effusion. 5 mm right apical nodular opacity not seen on prior studies.  IMPRESSION: Findings consistent with interstitial lung disease related to sarcoidosis. 5 mm nodular opacity right apex likely related to interstitial fibrosis but new from prior studies. An unenhanced CT thorax could be considered to evaluate further.   Electronically Signed   By: Skipper Cliche M.D.   On: 01/25/2015 14:33   Ct Cervical Spine Wo Contrast  01/25/2015   CLINICAL DATA:  Left side neck pain for about 2 weeks with worsening in last week, intermittent hand numbness  EXAM: CT CERVICAL SPINE WITHOUT CONTRAST  TECHNIQUE: Multidetector CT imaging of the cervical spine was performed without intravenous contrast. Multiplanar CT image reconstructions were also generated.  COMPARISON:  CT scan of the chest 07/17/2014  FINDINGS: Axial images of the cervical spine shows no acute fracture or subluxation. There is no  pneumothorax in visualized lung apices. Again noted bilateral upper lobe fibrotic changes with mild progression from prior exam.  Computer processed images shows no acute fracture or subluxation. There is mild to moderate disc space flattening with mild anterior and mild posterior spurring at C3-C4 level. Mild spinal stenosis due to posterior spurring at C3-C4 level. There is moderate disc space flattening with mild anterior and mild posterior spurring at C4-C5 and C5-C6 level. There is moderate spinal canal stenosis due to posterior spurring and calcification of posterior longitudinal ligament at C5-C6 and C6-C7 level. Minimal disc space flattening with anterior spurring at C4-C5 level. Mild anterior spurring lower endplate of T1 vertebral body. No prevertebral soft tissue swelling. Cervical airway is patent.  IMPRESSION: 1. No acute fracture or subluxation. Multilevel degenerative changes as described above.   Electronically Signed   By: Lahoma Crocker M.D.   On: 01/25/2015 13:10     EKG Interpretation None      MDM     Discussed results wwith patient and husband- they would like to get ct chest here as advised in cxr report.    1- left supraclavicular pain- neck ct negative.  Chest ct obtained due to patient's concern re sarcoid.  Non correlation 5 mm lesion seen.  CT chest pending. Pain improved with im dilaudid 2- leg pain- patient with thorough op evaluation, no evidence of dvt seen.  Patient will follow up op.  3=anemia- stable   Shaune Pollack, MD 01/25/15 1630

## 2015-01-27 NOTE — Telephone Encounter (Signed)
Pt seen at ED on Saturday.  Pt scheduled with TP on Thursday at 4:15 for follow up.  Nothing further needed.

## 2015-01-30 ENCOUNTER — Encounter: Payer: Self-pay | Admitting: Adult Health

## 2015-01-30 ENCOUNTER — Ambulatory Visit (INDEPENDENT_AMBULATORY_CARE_PROVIDER_SITE_OTHER): Payer: Medicare Other | Admitting: Adult Health

## 2015-01-30 ENCOUNTER — Other Ambulatory Visit: Payer: Medicare Other

## 2015-01-30 VITALS — BP 104/68 | HR 76 | Temp 98.1°F | Ht 64.25 in | Wt 252.8 lb

## 2015-01-30 DIAGNOSIS — J4530 Mild persistent asthma, uncomplicated: Secondary | ICD-10-CM

## 2015-01-30 DIAGNOSIS — D860 Sarcoidosis of lung: Secondary | ICD-10-CM

## 2015-01-30 DIAGNOSIS — G4733 Obstructive sleep apnea (adult) (pediatric): Secondary | ICD-10-CM

## 2015-01-30 DIAGNOSIS — Z9989 Dependence on other enabling machines and devices: Secondary | ICD-10-CM

## 2015-01-30 NOTE — Progress Notes (Signed)
History of Present Illness: Cynthia Kirk is a 64 y.o. female with Asthma, Sarcoidosis, OSA, Rhinitis, and Hoarseness.  TESTS: Spirometry 06/30/09 >> FEV1 1.69(87%), FVC 2.17(83%), FEV1% 78 CPAP 12/25/12 to 01/23/13 >> Used on 20 of 30 nights with average 3 hrs 51 min. Average AHI 0.8 with CPAP 9 cm H2O. Minimal airleak. PSG 03/06/14 >> AHI 1.9, REM AHI 26.7, SaO2 low 80%. PSG 05/28/14 >> AHI 5.8, SaO2 low 85%. REM AHI 29.3 CT chest 07/17/14 >> upper lobe predominant BTX, GGO, nodularity no change since 2011 PFT 11/15/14 >> FEV1 1.76 (87%), FEV1% 77, TLC 3.19 (63%), DLCO 74%, no BD  01/30/2015 ER follow up  Patient returns for a one-week follow-up from emergency room visit Patient has been having ongoing neck, back and leg pain for long time.  CT cervical spine showed multilevel degenerative changes. CT chest showed stable changes of sarcoidosis. Patient has a known history of DJD, status post lumbar laminectomy. Patient was seen 2 months ago with a stable PFT. She denies any flare of cough, wheezing or shortness of breath. Denies rash, hemoptysis. Labs were reviewed with normal LFTs.  She remains on C Pap for underlying sleep apnea. Doing well with no significant daytime sleepiness        ROS :  Constitutional:   No  weight loss, night sweats,  Fevers, chills,  +fatigue, or  lassitude.  HEENT:   No headaches,  Difficulty swallowing,  Tooth/dental problems, or  Sore throat,                No sneezing, itching, ear ache, nasal congestion, post nasal drip,   CV:  No chest pain,  Orthopnea, PND, swelling in lower extremities, anasarca, dizziness, palpitations, syncope.   GI  No heartburn, indigestion, abdominal pain, nausea, vomiting, diarrhea, change in bowel habits, loss of appetite, bloody stools.   Resp: No shortness of breath with exertion or at rest.  No excess mucus, no productive cough,  No non-productive cough,  No coughing up of blood.  No change in color of mucus.  No  wheezing.  No chest wall deformity  Skin: no rash or lesions.  GU: no dysuria, change in color of urine, no urgency or frequency.  No flank pain, no hematuria   MS:   + back pain.  Psych:  No change in mood or affect. No depression or anxiety.  No memory loss.       EXAM :  GEN: A/Ox3; pleasant , NAD, well nourished , obese   HEENT:  [place]/AT,  EACs-clear, TMs-wnl, NOSE-clear, THROAT-clear, no lesions, no postnasal drip or exudate noted.   NECK:  Supple w/ fair ROM; no JVD; normal carotid impulses w/o bruits; no thyromegaly or nodules palpated; no lymphadenopathy.  RESP  Clear  P & A; w/o, wheezes/ rales/ or rhonchi.no accessory muscle use, no dullness to percussion  CARD:  RRR, no m/r/g  , no peripheral edema, pulses intact, no cyanosis or clubbing., neg homans sign   GI:   Soft & nt; nml bowel sounds; no organomegaly or masses detected.  Musco: Warm bil, no deformities or joint swelling noted, tender along post neck   Neuro: alert, no focal deficits noted.    Skin: Warm, no lesions or rashes

## 2015-01-30 NOTE — Assessment & Plan Note (Signed)
Controlled on CPAP  

## 2015-01-30 NOTE — Patient Instructions (Signed)
Continue with CPAP At bedtime   Labs today .  Follow up with Primary MD for neck and back pain  follow up Dr. Halford Chessman  As planned and As needed   Please contact office for sooner follow up if symptoms do not improve or worsen or seek emergency care

## 2015-01-30 NOTE — Assessment & Plan Note (Signed)
Appears to be stable  Last PFT stable lung fxn  Repeat CT chest shows stable changes Unclear joint pain are related, appears to have known DJD/DDD suggested she follow up with PCP for evaluation  Will check ACE today .

## 2015-01-30 NOTE — Assessment & Plan Note (Signed)
Compensated without flare   Plan  Labs today .  follow up Dr. Halford Chessman  As planned and As needed   Please contact office for sooner follow up if symptoms do not improve or worsen or seek emergency care

## 2015-01-31 LAB — ANGIOTENSIN CONVERTING ENZYME: Angiotensin-Converting Enzyme: 21 U/L (ref 8–52)

## 2015-01-31 NOTE — Progress Notes (Signed)
Reviewed and agree with assessment/plan. 

## 2015-01-31 NOTE — Progress Notes (Signed)
Quick Note:  Called spoke with patient, advised of lab results / recs as stated by TP. Pt verbalized her understanding and denied any questions. ______ 

## 2015-02-03 ENCOUNTER — Telehealth: Payer: Self-pay

## 2015-02-03 NOTE — Telephone Encounter (Signed)
[place] called from from [facility] part D request to speak to the assistant concern about this appeal. Please give her a call back [phone].

## 2015-02-03 NOTE — Telephone Encounter (Signed)
Appeal sent for bystolic sent in.

## 2015-02-18 ENCOUNTER — Telehealth: Payer: Self-pay | Admitting: Internal Medicine

## 2015-02-18 NOTE — Telephone Encounter (Signed)
Pt called stated that drug store request PA for [name] HCT. Please check and call pt back if we got the request from the drug store.

## 2015-02-18 NOTE — Telephone Encounter (Signed)
Called [facility] and they are sending the PA now.   Called pt and informed.

## 2015-02-19 ENCOUNTER — Other Ambulatory Visit: Payer: Self-pay | Admitting: Geriatric Medicine

## 2015-02-19 MED ORDER — OLMESARTAN MEDOXOMIL-HCTZ 40-25 MG PO TABS: 1.0000 | ORAL_TABLET | Freq: Every day | ORAL | Status: DC

## 2015-02-20 ENCOUNTER — Telehealth: Payer: Self-pay | Admitting: Internal Medicine

## 2015-02-20 ENCOUNTER — Telehealth: Payer: Self-pay | Admitting: Gastroenterology

## 2015-02-20 ENCOUNTER — Other Ambulatory Visit: Payer: Self-pay | Admitting: Gastroenterology

## 2015-02-20 NOTE — Telephone Encounter (Signed)
Patient states Holland Falling told her that we could appeal the [place] and get it approved. Told patient that's not neccisarilly true but I do not mind attempting the appeal. Called phone number listed in denial letter for [employer]. Spoke with Felicita Gage and he states it like there was no PA done at all. Told him I have a denial letter from January so I am not sure why there is not denial in the system. He states they will just start over and attempt PA. Dexilant has been approved from 12/06/2014 to 12/06/15. He ran the medication to make sure it went through at the pharmacy and it did. Informed patient and patient verbalized understanding.

## 2015-02-20 NOTE — Telephone Encounter (Signed)
NEEDS OFFICE VISIT FOR ANY FURTHER REFILLS! 

## 2015-02-20 NOTE — Telephone Encounter (Signed)
Pt called to check up on the status of the [name] bc she is about to be out this med. Please call pt

## 2015-02-21 NOTE — Telephone Encounter (Signed)
PA approved for Bystolic.

## 2015-02-21 NOTE — Telephone Encounter (Signed)
Called pt to clarify msg pt is need PA on her [name] she stated she spoke with [place] on Wednesday. Inform pt will send msg to [facility] to check status on PA and to give her a call...Johny Chess

## 2015-02-21 NOTE — Telephone Encounter (Signed)
Pt contacted and informed that we are still waiting on insurance to respond.   ~Sharmarke Cicio

## 2015-02-22 ENCOUNTER — Other Ambulatory Visit: Payer: Self-pay | Admitting: Gastroenterology

## 2015-02-24 ENCOUNTER — Other Ambulatory Visit: Payer: Self-pay | Admitting: Internal Medicine

## 2015-02-24 MED ORDER — LOSARTAN POTASSIUM-HCTZ 100-25 MG PO TABS: 1.0000 | ORAL_TABLET | Freq: Every day | ORAL | Status: DC

## 2015-02-24 NOTE — Telephone Encounter (Signed)
Pt call in and is requesting call back about this PA and the mg on the meds  Best number  [phone]

## 2015-02-25 MED ORDER — OLMESARTAN MEDOXOMIL-HCTZ 40-25 MG PO TABS: 1.0000 | ORAL_TABLET | Freq: Every day | ORAL | Status: DC

## 2015-02-25 NOTE — Telephone Encounter (Signed)
Received PA that was fax for [name] 40/25 mg. Completed PA fax back to [facility] waiting on approval response...Cynthia Kirk

## 2015-02-25 NOTE — Telephone Encounter (Signed)
Patient called back in regards.  States PA was approved but PA was sent in for 20mg  not 40 -25 of benicar HTZ.  Therefore the pharmacy will not fill.  Patient is out of med.  States she has already spent 30.00 out of pocket for 3 pills on Friday.  [employer] will not reimburse because PA was written for incorrect amount.  States insurance company faxed a form over yesterday so the right strength can be corrected.  Can you please look into this?  Patient is requesting a call back in regards to keep her updated as to what is going on and when she can get her script filled.

## 2015-02-26 NOTE — Telephone Encounter (Signed)
Called [employer] spoke with Rep case is still pending. She stated md & pt will be contacted once a decision has been made...Cynthia Kirk

## 2015-02-26 NOTE — Telephone Encounter (Signed)
Pt called nad stated that the insurance company has called her to inform her the benicar 40/25 mg has been approved she is at the pharmacy now they are filling med....Cynthia Kirk

## 2015-03-03 ENCOUNTER — Other Ambulatory Visit: Payer: Self-pay | Admitting: Internal Medicine

## 2015-03-03 ENCOUNTER — Other Ambulatory Visit: Payer: Self-pay | Admitting: Gastroenterology

## 2015-03-04 ENCOUNTER — Telehealth: Payer: Self-pay

## 2015-03-04 NOTE — Telephone Encounter (Signed)
Changed appt with Dr. Jenny Reichmann to a CPE.   [place] Done and [facility] [place]

## 2015-03-04 NOTE — Telephone Encounter (Signed)
appt is requesting refills but needs to have a cpe scheduled for any additional refills.   Alprazolam will be sent but the bystolic should have refills.

## 2015-03-04 NOTE — Telephone Encounter (Signed)
Patient understands.  She has an appointment scheduled with Dr. Jenny Reichmann in April.

## 2015-03-05 ENCOUNTER — Ambulatory Visit: Payer: Medicare Other | Admitting: Internal Medicine

## 2015-03-12 ENCOUNTER — Other Ambulatory Visit: Payer: Self-pay | Admitting: Gastroenterology

## 2015-03-14 ENCOUNTER — Other Ambulatory Visit: Payer: Self-pay

## 2015-03-18 ENCOUNTER — Encounter: Payer: Medicare Other | Admitting: Internal Medicine

## 2015-03-26 ENCOUNTER — Encounter: Payer: Self-pay | Admitting: Gastroenterology

## 2015-03-26 ENCOUNTER — Ambulatory Visit (INDEPENDENT_AMBULATORY_CARE_PROVIDER_SITE_OTHER): Payer: Medicare Other | Admitting: Gastroenterology

## 2015-03-26 VITALS — BP 106/60 | HR 68 | Ht 63.75 in | Wt 259.0 lb

## 2015-03-26 DIAGNOSIS — K5900 Constipation, unspecified: Secondary | ICD-10-CM

## 2015-03-26 DIAGNOSIS — K219 Gastro-esophageal reflux disease without esophagitis: Secondary | ICD-10-CM | POA: Diagnosis not present

## 2015-03-26 MED ORDER — FLUCONAZOLE 100 MG PO TABS: 100.0000 mg | ORAL_TABLET | Freq: Every day | ORAL | Status: DC

## 2015-03-26 MED ORDER — POLYETHYLENE GLYCOL 3350 17 GM/SCOOP PO POWD: ORAL | Status: DC

## 2015-03-26 MED ORDER — RANITIDINE HCL 300 MG PO TABS: ORAL_TABLET | ORAL | Status: DC

## 2015-03-26 MED ORDER — DEXLANSOPRAZOLE 60 MG PO CPDR: 1.0000 | DELAYED_RELEASE_CAPSULE | Freq: Every day | ORAL | Status: DC

## 2015-03-26 NOTE — Patient Instructions (Signed)
We have sent the following medications to your pharmacy for you to pick up at your convenience:Dexilant, ranitidine and Diflucan.  Thank you for choosing me and [facility].  Pricilla Riffle. Dagoberto Ligas., MD., Marval Regal

## 2015-03-26 NOTE — Progress Notes (Signed)
    History of Present Illness: This is a 64 year old female returning for follow-up of GERD. Reflux symptoms are well-controlled. She has constipation that is well controlled on MiraLAX. She notes frequent yeast infections of the skin in her lower abdomen and perianal area. Her gynecologist has been regularly prescribing short courses of Diflucan. She requests a refill of Diflucan.  Current Medications, Allergies, Past Medical History, Past Surgical History, Family History and Social History were reviewed in [employer] record.  Physical Exam: General: Well developed , well nourished, no acute distress Head: Normocephalic and atraumatic Eyes:  sclerae anicteric, EOMI Ears: Normal auditory acuity Mouth: No deformity or lesions Lungs: Clear throughout to auscultation Heart: Regular rate and rhythm; no murmurs, rubs or bruits Abdomen: Soft, non tender and non distended. No masses, hepatosplenomegaly or hernias noted. Normal Bowel sounds Musculoskeletal: Symmetrical with no gross deformities  Pulses:  Normal pulses noted Extremities: No clubbing, cyanosis, edema or deformities noted Neurological: Alert oriented x 4, grossly nonfocal Psychological:  Alert and cooperative. Normal mood and affect  Assessment and Recommendations:  1. GERD. Continue dexlansoprazole 60 mg qam and ranitidine 300 mg hs. Continue all standard antireflux measures.  2. Constipation. Continue MiraLAX once or twice daily titrated for adequate bowel movements  3. Candida dermatitis. Difluxan 100 mg daily for 7 days. Further follow-up and refills per her PCP or GYN.

## 2015-04-08 DIAGNOSIS — M170 Bilateral primary osteoarthritis of knee: Secondary | ICD-10-CM | POA: Diagnosis not present

## 2015-04-08 DIAGNOSIS — M1711 Unilateral primary osteoarthritis, right knee: Secondary | ICD-10-CM | POA: Diagnosis not present

## 2015-04-11 ENCOUNTER — Other Ambulatory Visit: Payer: Self-pay | Admitting: Internal Medicine

## 2015-04-11 ENCOUNTER — Encounter: Payer: Medicare Other | Admitting: Internal Medicine

## 2015-04-16 DIAGNOSIS — M1711 Unilateral primary osteoarthritis, right knee: Secondary | ICD-10-CM | POA: Diagnosis not present

## 2015-04-17 ENCOUNTER — Encounter: Payer: Medicare Other | Admitting: Internal Medicine

## 2015-04-18 ENCOUNTER — Encounter: Payer: Self-pay | Admitting: Internal Medicine

## 2015-04-18 ENCOUNTER — Ambulatory Visit (INDEPENDENT_AMBULATORY_CARE_PROVIDER_SITE_OTHER): Payer: Medicare Other | Admitting: Internal Medicine

## 2015-04-18 ENCOUNTER — Other Ambulatory Visit (INDEPENDENT_AMBULATORY_CARE_PROVIDER_SITE_OTHER): Payer: Medicare Other

## 2015-04-18 ENCOUNTER — Encounter: Payer: Medicare Other | Admitting: Internal Medicine

## 2015-04-18 VITALS — BP 114/64 | HR 80 | Temp 98.5°F | Resp 12 | Ht 63.0 in | Wt 257.0 lb

## 2015-04-18 DIAGNOSIS — R635 Abnormal weight gain: Secondary | ICD-10-CM | POA: Diagnosis not present

## 2015-04-18 DIAGNOSIS — I10 Essential (primary) hypertension: Secondary | ICD-10-CM | POA: Diagnosis not present

## 2015-04-18 DIAGNOSIS — E538 Deficiency of other specified B group vitamins: Secondary | ICD-10-CM

## 2015-04-18 DIAGNOSIS — Z0000 Encounter for general adult medical examination without abnormal findings: Secondary | ICD-10-CM | POA: Insufficient documentation

## 2015-04-18 DIAGNOSIS — E119 Type 2 diabetes mellitus without complications: Secondary | ICD-10-CM

## 2015-04-18 LAB — HEMOGLOBIN A1C: HEMOGLOBIN A1C: 5.3 % (ref 4.6–6.5)

## 2015-04-18 LAB — BASIC METABOLIC PANEL
BUN: 21 mg/dL (ref 6–23)
CHLORIDE: 101 meq/L (ref 96–112)
CO2: 31 mEq/L (ref 19–32)
CREATININE: 1.08 mg/dL (ref 0.40–1.20)
Calcium: 9.2 mg/dL (ref 8.4–10.5)
GFR: 65.65 mL/min (ref >60.00)
Glucose, Bld: 93 mg/dL (ref 70–99)
Potassium: 3.7 mEq/L (ref 3.5–5.1)
SODIUM: 136 meq/L (ref 135–145)

## 2015-04-18 LAB — LIPID PANEL
CHOL/HDL RATIO: 4
Cholesterol: 174 mg/dL (ref 0–200)
HDL: 49.6 mg/dL (ref >39.00)
LDL Cholesterol: 105 mg/dL — ABNORMAL HIGH (ref 0–99)
NonHDL: 124.4
Triglycerides: 95 mg/dL (ref 0.0–149.0)
VLDL: 19 mg/dL (ref 0.0–40.0)

## 2015-04-18 LAB — VITAMIN B12: Vitamin B-12: 657 pg/mL (ref 211–911)

## 2015-04-18 LAB — TSH: TSH: 0.76 u[IU]/mL (ref 0.35–4.50)

## 2015-04-18 MED ORDER — HYDROCORTISONE 2.5 % RE CREA: 1.0000 "application " | TOPICAL_CREAM | Freq: Two times a day (BID) | RECTAL | Status: DC

## 2015-04-18 NOTE — Assessment & Plan Note (Signed)
Checking labs today. Needs tetanus this year and she deferred today. Colonoscopy due in 2019. Given her 10 year screening recommendations. Talked to her about her weight as this is her biggest significant factor affecting her overall health now and for the future. She is interested in losing weight but is not sure she can do it.

## 2015-04-18 NOTE — Patient Instructions (Addendum)
I have sent in some cream for the itching. The other thing that we recommend is baby wipes for your bottom to avoid irritation.   Come back in about 6 months for a visit or sooner if you are having problems. If we do not find anything in the labs we will have you see a Psychologist, sport and exercise. The other option is the  website for their weight loss seminars which is a good way to get information about your options.   Health Maintenance Adopting a healthy lifestyle and getting preventive care can go a long way to promote health and wellness. Talk with your health care provider about what schedule of regular examinations is right for you. This is a good chance for you to check in with your provider about disease prevention and staying healthy. In between checkups, there are plenty of things you can do on your own. Experts have done a lot of research about which lifestyle changes and preventive measures are most likely to keep you healthy. Ask your health care provider for more information. WEIGHT AND DIET  Eat a healthy diet  Be sure to include plenty of vegetables, fruits, low-fat dairy products, and lean protein.  Do not eat a lot of foods high in solid fats, added sugars, or salt.  Get regular exercise. This is one of the most important things you can do for your health.  Most adults should exercise for at least 150 minutes each week. The exercise should increase your heart rate and make you sweat (moderate-intensity exercise).  Most adults should also do strengthening exercises at least twice a week. This is in addition to the moderate-intensity exercise.  Maintain a healthy weight  Body mass index (BMI) is a measurement that can be used to identify possible weight problems. It estimates body fat based on height and weight. Your health care provider can help determine your BMI and help you achieve or maintain a healthy weight.  For females 27 years of age and older:   A BMI below 18.5 is  considered underweight.  A BMI of 18.5 to 24.9 is normal.  A BMI of 25 to 29.9 is considered overweight.  A BMI of 30 and above is considered obese.  Watch levels of cholesterol and blood lipids  You should start having your blood tested for lipids and cholesterol at 64 years of age, then have this test every 5 years.  You may need to have your cholesterol levels checked more often if:  Your lipid or cholesterol levels are high.  You are older than 64 years of age.  You are at high risk for heart disease.  CANCER SCREENING   Lung Cancer  Lung cancer screening is recommended for adults 57-1 years old who are at high risk for lung cancer because of a history of smoking.  A yearly low-dose CT scan of the lungs is recommended for people who:  Currently smoke.  Have quit within the past 15 years.  Have at least a 30-pack-year history of smoking. A pack year is smoking an average of one pack of cigarettes a day for 1 year.  Yearly screening should continue until it has been 15 years since you quit.  Yearly screening should stop if you develop a health problem that would prevent you from having lung cancer treatment.  Breast Cancer  Practice breast self-awareness. This means understanding how your breasts normally appear and feel.  It also means doing regular breast self-exams. Let your health care provider  know about any changes, no matter how small.  If you are in your 20s or 30s, you should have a clinical breast exam (CBE) by a health care provider every 1-3 years as part of a regular health exam.  If you are 22 or older, have a CBE every year. Also consider having a breast X-ray (mammogram) every year.  If you have a family history of breast cancer, talk to your health care provider about genetic screening.  If you are at high risk for breast cancer, talk to your health care provider about having an MRI and a mammogram every year.  Breast cancer gene (BRCA)  assessment is recommended for women who have family members with BRCA-related cancers. BRCA-related cancers include:  Breast.  Ovarian.  Tubal.  Peritoneal cancers.  Results of the assessment will determine the need for genetic counseling and BRCA1 and BRCA2 testing. Cervical Cancer Routine pelvic examinations to screen for cervical cancer are no longer recommended for nonpregnant women who are considered low risk for cancer of the pelvic organs (ovaries, uterus, and vagina) and who do not have symptoms. A pelvic examination may be necessary if you have symptoms including those associated with pelvic infections. Ask your health care provider if a screening pelvic exam is right for you.   The Pap test is the screening test for cervical cancer for women who are considered at risk.  If you had a hysterectomy for a problem that was not cancer or a condition that could lead to cancer, then you no longer need Pap tests.  If you are older than 65 years, and you have had normal Pap tests for the past 10 years, you no longer need to have Pap tests.  If you have had past treatment for cervical cancer or a condition that could lead to cancer, you need Pap tests and screening for cancer for at least 20 years after your treatment.  If you no longer get a Pap test, assess your risk factors if they change (such as having a new sexual partner). This can affect whether you should start being screened again.  Some women have medical problems that increase their chance of getting cervical cancer. If this is the case for you, your health care provider may recommend more frequent screening and Pap tests.  The human papillomavirus (HPV) test is another test that may be used for cervical cancer screening. The HPV test looks for the virus that can cause cell changes in the cervix. The cells collected during the Pap test can be tested for HPV.  The HPV test can be used to screen women 1 years of age and older.  Getting tested for HPV can extend the interval between normal Pap tests from three to five years.  An HPV test also should be used to screen women of any age who have unclear Pap test results.  After 64 years of age, women should have HPV testing as often as Pap tests.  Colorectal Cancer  This type of cancer can be detected and often prevented.  Routine colorectal cancer screening usually begins at 64 years of age and continues through 64 years of age.  Your health care provider may recommend screening at an earlier age if you have risk factors for colon cancer.  Your health care provider may also recommend using home test kits to check for hidden blood in the stool.  A small camera at the end of a tube can be used to examine your colon directly (  sigmoidoscopy or colonoscopy). This is done to check for the earliest forms of colorectal cancer.  Routine screening usually begins at age 56.  Direct examination of the colon should be repeated every 5-10 years through 64 years of age. However, you may need to be screened more often if early forms of precancerous polyps or small growths are found. Skin Cancer  Check your skin from head to toe regularly.  Tell your health care provider about any new moles or changes in moles, especially if there is a change in a mole's shape or color.  Also tell your health care provider if you have a mole that is larger than the size of a pencil eraser.  Always use sunscreen. Apply sunscreen liberally and repeatedly throughout the day.  Protect yourself by wearing long sleeves, pants, a wide-brimmed hat, and sunglasses whenever you are outside. HEART DISEASE, DIABETES, AND HIGH BLOOD PRESSURE   Have your blood pressure checked at least every 1-2 years. High blood pressure causes heart disease and increases the risk of stroke.  If you are between 88 years and 46 years old, ask your health care provider if you should take aspirin to prevent  strokes.  Have regular diabetes screenings. This involves taking a blood sample to check your fasting blood sugar level.  If you are at a normal weight and have a low risk for diabetes, have this test once every three years after 64 years of age.  If you are overweight and have a high risk for diabetes, consider being tested at a younger age or more often. PREVENTING INFECTION  Hepatitis B  If you have a higher risk for hepatitis B, you should be screened for this virus. You are considered at high risk for hepatitis B if:  You were born in a country where hepatitis B is common. Ask your health care provider which countries are considered high risk.  Your parents were born in a high-risk country, and you have not been immunized against hepatitis B (hepatitis B vaccine).  You have HIV or AIDS.  You use needles to inject street drugs.  You live with someone who has hepatitis B.  You have had sex with someone who has hepatitis B.  You get hemodialysis treatment.  You take certain medicines for conditions, including cancer, organ transplantation, and autoimmune conditions. Hepatitis C  Blood testing is recommended for:  Everyone born from [birth year] through 1965.  Anyone with known risk factors for hepatitis C. Sexually transmitted infections (STIs)  You should be screened for sexually transmitted infections (STIs) including gonorrhea and chlamydia if:  You are sexually active and are younger than 64 years of age.  You are older than 64 years of age and your health care provider tells you that you are at risk for this type of infection.  Your sexual activity has changed since you were last screened and you are at an increased risk for chlamydia or gonorrhea. Ask your health care provider if you are at risk.  If you do not have HIV, but are at risk, it may be recommended that you take a prescription medicine daily to prevent HIV infection. This is called pre-exposure prophylaxis  (PrEP). You are considered at risk if:  You are sexually active and do not regularly use condoms or know the HIV status of your partner(s).  You take drugs by injection.  You are sexually active with a partner who has HIV. Talk with your health care provider about whether you are at  high risk of being infected with HIV. If you choose to begin PrEP, you should first be tested for HIV. You should then be tested every 3 months for as long as you are taking PrEP.  PREGNANCY   If you are premenopausal and you may become pregnant, ask your health care provider about preconception counseling.  If you may become pregnant, take 400 to 800 micrograms (mcg) of folic acid every day.  If you want to prevent pregnancy, talk to your health care provider about birth control (contraception). OSTEOPOROSIS AND MENOPAUSE   Osteoporosis is a disease in which the bones lose minerals and strength with aging. This can result in serious bone fractures. Your risk for osteoporosis can be identified using a bone density scan.  If you are 35 years of age or older, or if you are at risk for osteoporosis and fractures, ask your health care provider if you should be screened.  Ask your health care provider whether you should take a calcium or vitamin D supplement to lower your risk for osteoporosis.  Menopause may have certain physical symptoms and risks.  Hormone replacement therapy may reduce some of these symptoms and risks. Talk to your health care provider about whether hormone replacement therapy is right for you.  HOME CARE INSTRUCTIONS   Schedule regular health, dental, and eye exams.  Stay current with your immunizations.   Do not use any tobacco products including cigarettes, chewing tobacco, or electronic cigarettes.  If you are pregnant, do not drink alcohol.  If you are breastfeeding, limit how much and how often you drink alcohol.  Limit alcohol intake to no more than 1 drink per day for  nonpregnant women. One drink equals 12 ounces of beer, 5 ounces of wine, or 1 ounces of hard liquor.  Do not use street drugs.  Do not share needles.  Ask your health care provider for help if you need support or information about quitting drugs.  Tell your health care provider if you often feel depressed.  Tell your health care provider if you have ever been abused or do not feel safe at home. Document Released: 06/07/2011 Document Revised: 04/08/2014 Document Reviewed: 10/24/2013 [facility] Patient Information 2015 [place], [place]. This information is not intended to replace advice given to you by your health care provider. Make sure you discuss any questions you have with your health care provider.

## 2015-04-18 NOTE — Assessment & Plan Note (Signed)
HgA1c not in range for diabetes for several years. Would need to comb records to see if she ever met criteria. Not on medication right now. Checking HgA1c and foot exam done. She does get yearly eye exam. No complications.

## 2015-04-18 NOTE — Assessment & Plan Note (Signed)
She has said today that she wants to get a tummy tuck to get rid of the fat on her stomach. Talked to her about the bariatric weight loss seminar but will refer to surgery per her request.

## 2015-04-18 NOTE — Progress Notes (Signed)
Pre visit review using our clinic review tool, if applicable. No additional management support is needed unless otherwise documented below in the visit note. 

## 2015-04-18 NOTE — Assessment & Plan Note (Signed)
Checking B12 levels today as not checked in some time.

## 2015-04-18 NOTE — Assessment & Plan Note (Signed)
Bystolic and olmesartan/hctz controlling her BP well. Checking labs today and adjust as needed.

## 2015-04-18 NOTE — Progress Notes (Signed)
   Subjective:    Patient ID: Cynthia Kirk, female    DOB: [birth year], 64 y.o.   MRN: [id number]  HPI Here for medicare wellness, no new complaints. May need knee surgery and meeting with ortho this week. Please see A/P for status and treatment of chronic medical problems.   Diet: DM if diabetic Physical activity: sedentary Depression/mood screen: negative Hearing: intact to whispered voice Visual acuity: grossly normal, performs annual eye exam, mild cataracts ADLs: capable Fall risk: low, knee pain increases risk Home safety: good Cognitive evaluation: intact to orientation, naming, recall and repetition EOL planning: adv directives discussed  I have personally reviewed and have noted 1. The patient's medical and social history - reviewed today no changes 2. Their use of alcohol, tobacco or illicit drugs 3. Their current medications and supplements 4. The patient's functional ability including ADL's, fall risks, home safety risks and hearing or visual impairment. 5. Diet and physical activities 6. Evidence for depression or mood disorders 7. Care team reviewed and updated (available in snapshot)  Review of Systems  Constitutional: Negative for fever, activity change, appetite change and fatigue.  Respiratory: Negative for cough, chest tightness, shortness of breath and wheezing.   Cardiovascular: Negative for chest pain, palpitations and leg swelling.  Gastrointestinal: Negative for abdominal pain, diarrhea, constipation and abdominal distention.  Musculoskeletal: Positive for arthralgias and gait problem. Negative for back pain and neck pain.  Skin: Negative.   Neurological: Negative.   Psychiatric/Behavioral: Negative.       Objective:   Physical Exam  Constitutional: She is oriented to person, place, and time. She appears well-developed and well-nourished. No distress.  Overweight  HENT:  Head: Normocephalic and atraumatic.  Eyes: EOM are normal.  Neck: Normal range  of motion.  Cardiovascular: Normal rate and regular rhythm.   Pulmonary/Chest: Effort normal and breath sounds normal.  Abdominal: Soft. Bowel sounds are normal.  Neurological: She is alert and oriented to person, place, and time. Coordination normal.  Skin: Skin is warm and dry.   Filed Vitals:   04/18/15 1541  BP: 114/64  Pulse: 80  Temp: 98.5 F (36.9 C)  TempSrc: Oral  Resp: 12  Height: 5\' 3"  (1.6 m)  Weight: 257 lb (116.574 kg)  SpO2: 98%      Assessment & Plan:

## 2015-04-22 ENCOUNTER — Telehealth: Payer: Self-pay | Admitting: Geriatric Medicine

## 2015-04-22 DIAGNOSIS — M170 Bilateral primary osteoarthritis of knee: Secondary | ICD-10-CM | POA: Diagnosis not present

## 2015-04-22 NOTE — Telephone Encounter (Signed)
Patient called back with the surgeon. [facility].

## 2015-04-22 NOTE — Telephone Encounter (Signed)
A neurosurgeon is not going to help her stomach. She would need a Psychiatric nurse if she wants a tummy tuck. If she wants to consider weight loss surgery like bypass or banding she will need to do the seminar through  before the general surgeon would meet with her.

## 2015-04-23 NOTE — Telephone Encounter (Signed)
Patient would like to go to a Psychiatric nurse. She says its creating health issues and she says insurance will cover it. Please refer to plastic surgery. Thanks .

## 2015-04-23 NOTE — Addendum Note (Signed)
Addended by: Vertell Novak A on: 04/23/2015 11:06 AM   Modules accepted: Orders

## 2015-04-23 NOTE — Telephone Encounter (Signed)
Will place referral and she can talk to the surgeon about it.

## 2015-04-24 ENCOUNTER — Encounter: Payer: Medicare Other | Admitting: Internal Medicine

## 2015-05-01 DIAGNOSIS — Z6841 Body Mass Index (BMI) 40.0 and over, adult: Secondary | ICD-10-CM | POA: Diagnosis not present

## 2015-05-01 DIAGNOSIS — Z01411 Encounter for gynecological examination (general) (routine) with abnormal findings: Secondary | ICD-10-CM | POA: Diagnosis not present

## 2015-05-01 DIAGNOSIS — Z124 Encounter for screening for malignant neoplasm of cervix: Secondary | ICD-10-CM | POA: Diagnosis not present

## 2015-05-01 DIAGNOSIS — N950 Postmenopausal bleeding: Secondary | ICD-10-CM | POA: Diagnosis not present

## 2015-05-01 DIAGNOSIS — E559 Vitamin D deficiency, unspecified: Secondary | ICD-10-CM | POA: Diagnosis not present

## 2015-05-01 DIAGNOSIS — Z1231 Encounter for screening mammogram for malignant neoplasm of breast: Secondary | ICD-10-CM | POA: Diagnosis not present

## 2015-05-01 DIAGNOSIS — N951 Menopausal and female climacteric states: Secondary | ICD-10-CM | POA: Diagnosis not present

## 2015-05-01 DIAGNOSIS — R32 Unspecified urinary incontinence: Secondary | ICD-10-CM | POA: Diagnosis not present

## 2015-05-12 ENCOUNTER — Other Ambulatory Visit: Payer: Self-pay

## 2015-05-12 MED ORDER — NEBIVOLOL HCL 5 MG PO TABS: 5.0000 mg | ORAL_TABLET | Freq: Every day | ORAL | Status: DC

## 2015-05-20 ENCOUNTER — Other Ambulatory Visit: Payer: Self-pay | Admitting: Obstetrics and Gynecology

## 2015-05-20 DIAGNOSIS — R339 Retention of urine, unspecified: Secondary | ICD-10-CM | POA: Diagnosis not present

## 2015-05-20 DIAGNOSIS — N920 Excessive and frequent menstruation with regular cycle: Secondary | ICD-10-CM | POA: Diagnosis not present

## 2015-05-20 DIAGNOSIS — N951 Menopausal and female climacteric states: Secondary | ICD-10-CM | POA: Diagnosis not present

## 2015-05-20 DIAGNOSIS — N950 Postmenopausal bleeding: Secondary | ICD-10-CM | POA: Diagnosis not present

## 2015-05-20 DIAGNOSIS — N858 Other specified noninflammatory disorders of uterus: Secondary | ICD-10-CM | POA: Diagnosis not present

## 2015-05-26 ENCOUNTER — Other Ambulatory Visit: Payer: Self-pay | Admitting: Internal Medicine

## 2015-06-03 ENCOUNTER — Encounter: Payer: Self-pay | Admitting: Internal Medicine

## 2015-06-03 DIAGNOSIS — E119 Type 2 diabetes mellitus without complications: Secondary | ICD-10-CM

## 2015-06-06 NOTE — Telephone Encounter (Signed)
Patient stated that the [facility] center of [place] haven't received the referral, she need it done today, please advise  [phone]

## 2015-06-10 ENCOUNTER — Telehealth: Payer: Self-pay | Admitting: Internal Medicine

## 2015-06-10 NOTE — Telephone Encounter (Signed)
Patient is calling back regarding her referral to the foot doctor. They have not received the referral yet. Can you please check on this

## 2015-06-11 NOTE — Telephone Encounter (Signed)
Referral has been faxed.

## 2015-06-12 DIAGNOSIS — N762 Acute vulvitis: Secondary | ICD-10-CM | POA: Diagnosis not present

## 2015-06-12 DIAGNOSIS — N3281 Overactive bladder: Secondary | ICD-10-CM | POA: Diagnosis not present

## 2015-06-12 DIAGNOSIS — Z09 Encounter for follow-up examination after completed treatment for conditions other than malignant neoplasm: Secondary | ICD-10-CM | POA: Diagnosis not present

## 2015-06-12 DIAGNOSIS — Z1231 Encounter for screening mammogram for malignant neoplasm of breast: Secondary | ICD-10-CM | POA: Diagnosis not present

## 2015-06-13 ENCOUNTER — Other Ambulatory Visit: Payer: Self-pay | Admitting: Pulmonary Disease

## 2015-06-13 ENCOUNTER — Other Ambulatory Visit: Payer: Self-pay | Admitting: Gastroenterology

## 2015-06-13 DIAGNOSIS — M2570 Osteophyte, unspecified joint: Secondary | ICD-10-CM | POA: Diagnosis not present

## 2015-06-13 DIAGNOSIS — M79609 Pain in unspecified limb: Secondary | ICD-10-CM | POA: Diagnosis not present

## 2015-06-13 DIAGNOSIS — B351 Tinea unguium: Secondary | ICD-10-CM | POA: Diagnosis not present

## 2015-06-25 DIAGNOSIS — M2570 Osteophyte, unspecified joint: Secondary | ICD-10-CM | POA: Diagnosis not present

## 2015-06-25 DIAGNOSIS — Z01812 Encounter for preprocedural laboratory examination: Secondary | ICD-10-CM | POA: Diagnosis not present

## 2015-06-25 DIAGNOSIS — E119 Type 2 diabetes mellitus without complications: Secondary | ICD-10-CM | POA: Diagnosis not present

## 2015-06-26 ENCOUNTER — Telehealth: Payer: Self-pay | Admitting: Internal Medicine

## 2015-06-26 NOTE — Telephone Encounter (Signed)
States that patient is having surgery on August 8th and DR. Ziglar is wanting patient to have a central ine put in.  Would like to know if Dr. Asa Lente could send orders over.  Fax number is [phone]

## 2015-06-26 NOTE — Telephone Encounter (Signed)
Is requesting follow up call as to Dr. Katheren Puller response (if she can or can not complete order).

## 2015-06-26 NOTE — Telephone Encounter (Signed)
Spoke to pt and pt states that they will not have an IV team to find veins due to pt having deep vein and they are difficult to stick.

## 2015-06-27 ENCOUNTER — Other Ambulatory Visit: Payer: Self-pay | Admitting: Internal Medicine

## 2015-07-09 ENCOUNTER — Other Ambulatory Visit (INDEPENDENT_AMBULATORY_CARE_PROVIDER_SITE_OTHER): Payer: Medicare Other

## 2015-07-09 ENCOUNTER — Ambulatory Visit (INDEPENDENT_AMBULATORY_CARE_PROVIDER_SITE_OTHER): Payer: Medicare Other | Admitting: Internal Medicine

## 2015-07-09 ENCOUNTER — Encounter: Payer: Self-pay | Admitting: Internal Medicine

## 2015-07-09 ENCOUNTER — Ambulatory Visit: Payer: Medicare Other | Admitting: Internal Medicine

## 2015-07-09 DIAGNOSIS — E119 Type 2 diabetes mellitus without complications: Secondary | ICD-10-CM

## 2015-07-09 DIAGNOSIS — B372 Candidiasis of skin and nail: Secondary | ICD-10-CM

## 2015-07-09 DIAGNOSIS — Z23 Encounter for immunization: Secondary | ICD-10-CM

## 2015-07-09 LAB — MICROALBUMIN / CREATININE URINE RATIO
CREATININE, U: 108.1 mg/dL
Microalb Creat Ratio: 0.6 mg/g (ref 0.0–30.0)
Microalb, Ur: <0.7 mg/dL (ref 0.0–1.9)

## 2015-07-09 LAB — HEMOGLOBIN A1C: HEMOGLOBIN A1C: 5.4 % (ref 4.6–6.5)

## 2015-07-09 MED ORDER — MOMETASONE FUROATE 220 MCG/INH IN AEPB: 2.0000 | INHALATION_SPRAY | Freq: Every day | RESPIRATORY_TRACT | Status: DC | PRN

## 2015-07-09 MED ORDER — FLUCONAZOLE 150 MG PO TABS: 150.0000 mg | ORAL_TABLET | Freq: Once | ORAL | Status: DC

## 2015-07-09 MED ORDER — NYSTATIN 100000 UNIT/GM EX POWD: 1.0000 g | Freq: Three times a day (TID) | CUTANEOUS | Status: DC

## 2015-07-09 NOTE — Patient Instructions (Addendum)
It was good to see you today.  We have reviewed your prior records including labs and tests today  Test(s) ordered today. Your results will be released to [place] (or called to you) after review, usually within 72hours after test completion. If any changes need to be made, you will be notified at that same time.  Medications reviewed and updated Use nystatin powder and Diflucan for candidiasis/yeast skin problems as discussed; no other changes recommended at this time.  For medical assistance with weight loss I would consider Victoza/Saxenda (as for diabetes mellitus), Contrave, Belviq or Qysmia -please research each, check with your insurance company if covered and let me know your preference  Continue working with your other specialists as reviewed and as ongoing  Please schedule followup in 6 months, call sooner if problems.  Exercise to Lose Weight Exercise and a healthy diet may help you lose weight. Your doctor may suggest specific exercises. EXERCISE IDEAS AND TIPS  Choose low-cost things you enjoy doing, such as walking, bicycling, or exercising to workout videos.  Take stairs instead of the elevator.  Walk during your lunch break.  Park your car further away from work or school.  Go to a gym or an exercise class.  Start with 5 to 10 minutes of exercise each day. Build up to 30 minutes of exercise 4 to 6 days a week.  Wear shoes with good support and comfortable clothes.  Stretch before and after working out.  Work out until you breathe harder and your heart beats faster.  Drink extra water when you exercise.  Do not do so much that you hurt yourself, feel dizzy, or get very short of breath. Exercises that burn about 150 calories:  Running 1  miles in 15 minutes.  Playing volleyball for 45 to 60 minutes.  Washing and waxing a car for 45 to 60 minutes.  Playing touch football for 45 minutes.  Walking 1  miles in 35 minutes.  Pushing a stroller 1  miles in  30 minutes.  Playing basketball for 30 minutes.  Raking leaves for 30 minutes.  Bicycling 5 miles in 30 minutes.  Walking 2 miles in 30 minutes.  Dancing for 30 minutes.  Shoveling snow for 15 minutes.  Swimming laps for 20 minutes.  Walking up stairs for 15 minutes.  Bicycling 4 miles in 15 minutes.  Gardening for 30 to 45 minutes.  Jumping rope for 15 minutes.  Washing windows or floors for 45 to 60 minutes. Document Released: 12/25/2010 Document Revised: 02/14/2012 Document Reviewed: 12/25/2010 [facility] Patient Information 2015 [place], [place]. This information is not intended to replace advice given to you by your health care provider. Make sure you discuss any questions you have with your health care provider.

## 2015-07-09 NOTE — Assessment & Plan Note (Signed)
Diet controlled - but weight gain noted check a1c now Consider initiation of Vicotza assistance with weight management as listed for obesity problem below Lab Results  Component Value Date   HGBA1C 5.3 04/18/2015

## 2015-07-09 NOTE — Progress Notes (Signed)
Subjective:    Patient ID: Jadalynn Burr, female    DOB: [birth year], 64 y.o.   MRN: [id number]  HPI  Patient here for follow-up. Reviewed chronic conditions, interval events and current concerns  Past Medical History  Diagnosis Date  . Hiatal hernia   . Morbid obesity 2010  . Esophageal reflux   . Allergic rhinitis, cause unspecified   . Unspecified asthma(493.90)   . Sarcoidosis 1996    @ [employer]  . Iron deficiency anemia   . Syncope     ?? Syncope ??  . Ejection fraction     EF 45-50%, echo, October 27, 2011  . H/O menorrhagia 2011  . Endometrial mass 2011  . Urinary incontinence, mixed 2011  . Simple endometrial hyperplasia 2011  . Herpes simplex type 2 infection complicating pregnancy   . ASCUS with positive high risk HPV   . VIN II (vulvar intraepithelial neoplasia II)   . Fibroid   . Anxiety   . H/O Clostridium difficile infection 03/2010  . Adenomatous colon polyp   . Blood transfusion 1976    Due to ectopic pregnancy  . Abnormal Pap smear 2008  . Obstructive sleep apnea (adult) (pediatric)     cpap  . Pneumonia 2005; 2007; 2009; 2011  . Type 2 diabetes, diet controlled   . Spinal stenosis, lumbar     s/p decompression laminectomy 09/2013  . Chronic back pain   . Panic attacks     Review of Systems  Constitutional: Positive for unexpected weight change (unable to lose weight).  Musculoskeletal: Positive for arthralgias (RLE - knee, ongoing eval for tx same, s/p steorid IA B knees).  Skin: Positive for rash (itch and crack skin between buttocks, also R side itch x 6 mo, intermittent).       Objective:    Physical Exam  Constitutional: She appears well-developed and well-nourished. No distress.  Severe obesity  Cardiovascular: Normal rate, regular rhythm and normal heart sounds.   No murmur heard. Pulmonary/Chest: Effort normal and breath sounds normal. No respiratory distress.  Musculoskeletal: She exhibits no edema.  Skin: Rash noted.  Candidiasis  changes gluteal crack and pannus fold on R side    BP 112/70 mmHg  Pulse 73  Temp(Src) 97.9 F (36.6 C) (Oral)  Ht 5\' 3"  (1.6 m)  Wt 264 lb (119.75 kg)  BMI 46.78 kg/m2  SpO2 98%  LMP 02/14/2014 Wt Readings from Last 3 Encounters:  07/09/15 264 lb (119.75 kg)  04/18/15 257 lb (116.574 kg)  03/26/15 259 lb (117.482 kg)    Lab Results  Component Value Date   WBC 5.0 01/25/2015   HGB 11.4* 01/25/2015   HCT 34.8* 01/25/2015   PLT 223 01/25/2015   GLUCOSE 93 04/18/2015   CHOL 174 04/18/2015   TRIG 95.0 04/18/2015   HDL 49.60 04/18/2015   LDLCALC 105* 04/18/2015   ALT 9 01/25/2015   AST 20 01/25/2015   NA 136 04/18/2015   K 3.7 04/18/2015   CL 101 04/18/2015   CREATININE 1.08 04/18/2015   BUN 21 04/18/2015   CO2 31 04/18/2015   TSH 0.76 04/18/2015   HGBA1C 5.3 04/18/2015    Dg Chest 2 View  01/25/2015   CLINICAL DATA:  Initial evaluation for chest pain for 2 days, history of sarcoidosis asthma bronchitis hypertension and diabetes  EXAM: CHEST  2 VIEW  COMPARISON:  11/28/2014  FINDINGS: The heart size and vascular pattern are normal. Mild bilateral hilar prominence is stable. Mild to moderate upper  lobe bilateral interstitial lung disease is stable. No consolidation or effusion. 5 mm right apical nodular opacity not seen on prior studies.  IMPRESSION: Findings consistent with interstitial lung disease related to sarcoidosis. 5 mm nodular opacity right apex likely related to interstitial fibrosis but new from prior studies. An unenhanced CT thorax could be considered to evaluate further.   Electronically Signed   By: Skipper Cliche M.D.   On: 01/25/2015 14:33   Ct Chest Wo Contrast  01/25/2015   CLINICAL DATA:  5 mm nodular opacity at the right lung apex on chest radiographs earlier today. Chest pain for the past 2 days. History of sarcoidosis, asthma and hypertension.  EXAM: CT CHEST WITHOUT CONTRAST  TECHNIQUE: Multidetector CT imaging of the chest was performed following the  standard protocol without IV contrast.  COMPARISON:  Chest radiographs obtained earlier today. Chest CT dated 07/17/2014.  FINDINGS: Interstitial fibrosis and scarring in both upper lobes and associated bronchiectasis have not changed significantly. New in the coronal plane, superimposed scarring in the posterior aspect of the right upper lobe is producing the nodular appearance on the chest radiographs earlier today. No true lung nodules are present and no enlarged lymph nodes are seen. Thoracic spine degenerative changes. Unremarkable upper abdomen.  IMPRESSION: 1. Stable changes of sarcoidosis. 2. No lung nodules.   Electronically Signed   By: Claudie Revering M.D.   On: 01/25/2015 16:07   Ct Cervical Spine Wo Contrast  01/25/2015   CLINICAL DATA:  Left side neck pain for about 2 weeks with worsening in last week, intermittent hand numbness  EXAM: CT CERVICAL SPINE WITHOUT CONTRAST  TECHNIQUE: Multidetector CT imaging of the cervical spine was performed without intravenous contrast. Multiplanar CT image reconstructions were also generated.  COMPARISON:  CT scan of the chest 07/17/2014  FINDINGS: Axial images of the cervical spine shows no acute fracture or subluxation. There is no pneumothorax in visualized lung apices. Again noted bilateral upper lobe fibrotic changes with mild progression from prior exam.  Computer processed images shows no acute fracture or subluxation. There is mild to moderate disc space flattening with mild anterior and mild posterior spurring at C3-C4 level. Mild spinal stenosis due to posterior spurring at C3-C4 level. There is moderate disc space flattening with mild anterior and mild posterior spurring at C4-C5 and C5-C6 level. There is moderate spinal canal stenosis due to posterior spurring and calcification of posterior longitudinal ligament at C5-C6 and C6-C7 level. Minimal disc space flattening with anterior spurring at C4-C5 level. Mild anterior spurring lower endplate of T1  vertebral body. No prevertebral soft tissue swelling. Cervical airway is patent.  IMPRESSION: 1. No acute fracture or subluxation. Multilevel degenerative changes as described above.   Electronically Signed   By: Lahoma Crocker M.D.   On: 01/25/2015 13:10       Assessment & Plan:   Problem List Items Addressed This Visit    Diabetes mellitus type 2, diet-controlled    Diet controlled - but weight gain noted check a1c now Consider initiation of Vicotza assistance with weight management as listed for obesity problem below Lab Results  Component Value Date   HGBA1C 5.3 04/18/2015        Relevant Orders   Hemoglobin A1c   Microalbumin / creatinine urine ratio   Severe obesity (BMI >= 40) - Primary    Wt Readings from Last 3 Encounters:  07/09/15 264 lb (119.75 kg)  04/18/15 257 lb (116.574 kg)  03/26/15 259 lb (117.482 kg)  Previously using phentermine in 2012 and 2014 - no adverse side effects from same Has considered bariatric surgical intervention, but no specific indication to undergo same time given other medical comorbidities Discussed other medical options for treatment including Saxenda, contrave, belviq and qysmia - patient/family will research same and let us know if any of these appear to be viable options Continue work on aerobic exercise as tolerated by pain and diet changes as initiated       Other Visit Diagnoses    Need for prophylactic vaccination with combined diphtheria-tetanus-pertussis (DTP) vaccine        Relevant Orders    Tdap vaccine greater than or equal to 7yo IM (Completed)    Candidiasis of skin        Relevant Medications    clotrimazole-betamethasone (LOTRISONE) cream    nystatin (MYCOSTATIN/NYSTOP) 100000 UNIT/GM POWD    fluconazole (DIFLUCAN) 150 MG tablet        Gwendolyn Grant, MD

## 2015-07-09 NOTE — Progress Notes (Signed)
Pre visit review using our clinic review tool, if applicable. No additional management support is needed unless otherwise documented below in the visit note. 

## 2015-07-09 NOTE — Assessment & Plan Note (Signed)
Wt Readings from Last 3 Encounters:  07/09/15 264 lb (119.75 kg)  04/18/15 257 lb (116.574 kg)  03/26/15 259 lb (117.482 kg)   Previously using phentermine in 2012 and 2014 - no adverse side effects from same Has considered bariatric surgical intervention, but no specific indication to undergo same time given other medical comorbidities Discussed other medical options for treatment including Saxenda, contrave, belviq and qysmia - patient/family will research same and let us know if any of these appear to be viable options Continue work on aerobic exercise as tolerated by pain and diet changes as initiated

## 2015-08-08 ENCOUNTER — Other Ambulatory Visit: Payer: Self-pay | Admitting: Pulmonary Disease

## 2015-08-11 ENCOUNTER — Emergency Department (HOSPITAL_COMMUNITY): Payer: Medicare Other

## 2015-08-11 ENCOUNTER — Encounter (HOSPITAL_COMMUNITY): Payer: Self-pay | Admitting: Emergency Medicine

## 2015-08-11 ENCOUNTER — Ambulatory Visit (HOSPITAL_COMMUNITY): Admission: RE | Admit: 2015-08-11 | Payer: Medicare Other | Source: Ambulatory Visit

## 2015-08-11 ENCOUNTER — Emergency Department (HOSPITAL_COMMUNITY)
Admission: EM | Admit: 2015-08-11 | Discharge: 2015-08-11 | Disposition: A | Discharge status: Home / Self Care | Payer: Medicare Other | Attending: Emergency Medicine | Admitting: Emergency Medicine

## 2015-08-11 DIAGNOSIS — Z862 Personal history of diseases of the blood and blood-forming organs and certain disorders involving the immune mechanism: Secondary | ICD-10-CM | POA: Diagnosis not present

## 2015-08-11 DIAGNOSIS — G8929 Other chronic pain: Secondary | ICD-10-CM | POA: Insufficient documentation

## 2015-08-11 DIAGNOSIS — R05 Cough: Secondary | ICD-10-CM | POA: Diagnosis not present

## 2015-08-11 DIAGNOSIS — Z79899 Other long term (current) drug therapy: Secondary | ICD-10-CM | POA: Insufficient documentation

## 2015-08-11 DIAGNOSIS — Z8619 Personal history of other infectious and parasitic diseases: Secondary | ICD-10-CM | POA: Insufficient documentation

## 2015-08-11 DIAGNOSIS — Z86018 Personal history of other benign neoplasm: Secondary | ICD-10-CM | POA: Insufficient documentation

## 2015-08-11 DIAGNOSIS — M79661 Pain in right lower leg: Secondary | ICD-10-CM | POA: Insufficient documentation

## 2015-08-11 DIAGNOSIS — G4733 Obstructive sleep apnea (adult) (pediatric): Secondary | ICD-10-CM | POA: Insufficient documentation

## 2015-08-11 DIAGNOSIS — F419 Anxiety disorder, unspecified: Secondary | ICD-10-CM | POA: Diagnosis not present

## 2015-08-11 DIAGNOSIS — Z91040 Latex allergy status: Secondary | ICD-10-CM | POA: Insufficient documentation

## 2015-08-11 DIAGNOSIS — M79601 Pain in right arm: Secondary | ICD-10-CM | POA: Insufficient documentation

## 2015-08-11 DIAGNOSIS — K219 Gastro-esophageal reflux disease without esophagitis: Secondary | ICD-10-CM | POA: Diagnosis not present

## 2015-08-11 DIAGNOSIS — Z8701 Personal history of pneumonia (recurrent): Secondary | ICD-10-CM | POA: Insufficient documentation

## 2015-08-11 DIAGNOSIS — E119 Type 2 diabetes mellitus without complications: Secondary | ICD-10-CM | POA: Diagnosis not present

## 2015-08-11 DIAGNOSIS — R079 Chest pain, unspecified: Secondary | ICD-10-CM | POA: Diagnosis not present

## 2015-08-11 DIAGNOSIS — Z9981 Dependence on supplemental oxygen: Secondary | ICD-10-CM | POA: Insufficient documentation

## 2015-08-11 DIAGNOSIS — J069 Acute upper respiratory infection, unspecified: Secondary | ICD-10-CM

## 2015-08-11 DIAGNOSIS — R059 Cough, unspecified: Secondary | ICD-10-CM

## 2015-08-11 LAB — BASIC METABOLIC PANEL
Anion gap: 6 (ref 5–15)
BUN: 10 mg/dL (ref 6–20)
CHLORIDE: 106 mmol/L (ref 101–111)
CO2: 27 mmol/L (ref 22–32)
Calcium: 8.9 mg/dL (ref 8.9–10.3)
Creatinine, Ser: 0.85 mg/dL (ref 0.44–1.00)
GFR calc Af Amer: >60 mL/min (ref >60)
GFR calc non Af Amer: >60 mL/min (ref >60)
Glucose, Bld: 104 mg/dL — ABNORMAL HIGH (ref 65–99)
POTASSIUM: 3.8 mmol/L (ref 3.5–5.1)
SODIUM: 139 mmol/L (ref 135–145)

## 2015-08-11 LAB — CBC
HEMATOCRIT: 32.6 % — AB (ref 36.0–46.0)
HEMOGLOBIN: 10.5 g/dL — AB (ref 12.0–15.0)
MCH: 29.5 pg (ref 26.0–34.0)
MCHC: 32.2 g/dL (ref 30.0–36.0)
MCV: 91.6 fL (ref 78.0–100.0)
Platelets: 193 10*3/uL (ref 150–400)
RBC: 3.56 MIL/uL — AB (ref 3.87–5.11)
RDW: 13.8 % (ref 11.5–15.5)
WBC: 3.3 10*3/uL — ABNORMAL LOW (ref 4.0–10.5)

## 2015-08-11 LAB — D-DIMER, QUANTITATIVE (NOT AT ARMC): D DIMER QUANT: 0.51 ug{FEU}/mL — AB (ref 0.00–0.48)

## 2015-08-11 MED ORDER — PREDNISONE 20 MG PO TABS: ORAL_TABLET | ORAL | Status: DC

## 2015-08-11 MED ORDER — MORPHINE SULFATE (PF) 4 MG/ML IV SOLN
8.0000 mg | Freq: Once | INTRAVENOUS | Status: AC
  Administered 2015-08-11: 8 mg via INTRAMUSCULAR
  Filled 2015-08-11: qty 2

## 2015-08-11 MED ORDER — HYDROCODONE-ACETAMINOPHEN 5-325 MG PO TABS
2.0000 | ORAL_TABLET | Freq: Once | ORAL | Status: AC
  Administered 2015-08-11: 2 via ORAL
  Filled 2015-08-11: qty 2

## 2015-08-11 MED ORDER — PREDNISONE 20 MG PO TABS: 20.0000 mg | ORAL_TABLET | Freq: Two times a day (BID) | ORAL | Status: DC

## 2015-08-11 MED ORDER — ONDANSETRON 4 MG PO TBDP
4.0000 mg | ORAL_TABLET | Freq: Once | ORAL | Status: AC
  Administered 2015-08-11: 4 mg via ORAL
  Filled 2015-08-11: qty 1

## 2015-08-11 MED ORDER — AZITHROMYCIN 250 MG PO TABS: 250.0000 mg | ORAL_TABLET | Freq: Every day | ORAL | Status: DC

## 2015-08-11 MED ORDER — TRAMADOL HCL 50 MG PO TABS: 50.0000 mg | ORAL_TABLET | Freq: Four times a day (QID) | ORAL | Status: DC | PRN

## 2015-08-11 MED ORDER — BENZONATATE 100 MG PO CAPS: 100.0000 mg | ORAL_CAPSULE | Freq: Three times a day (TID) | ORAL | Status: DC

## 2015-08-11 MED ORDER — DOXYCYCLINE HYCLATE 100 MG PO CAPS: 100.0000 mg | ORAL_CAPSULE | Freq: Two times a day (BID) | ORAL | Status: DC

## 2015-08-11 NOTE — ED Notes (Signed)
Pt c/o headache, cough, arm and leg pain x 2 weeks. Pt ambulatory in triage.

## 2015-08-11 NOTE — ED Notes (Signed)
Unable to get IV access after multiple attempts by ED physician, IV nurse and bedside nurse.

## 2015-08-11 NOTE — ED Provider Notes (Signed)
CSN: [id number]     Arrival date & time 08/11/15  1030 History   First MD Initiated Contact with Patient 08/11/15 1102     Chief Complaint  Patient presents with  . Headache  . Cough  . Arm Pain  . Leg Pain     (Consider location/radiation/quality/duration/timing/severity/associated sxs/prior Treatment) HPI Riva Sesma is a 64 y.o. female with a history of sarcoidosis, comes in for evaluation of headache, cough, side pain and leg pain. Patient reports intermittently for the past 2 weeks she has had a headache with associated nasal congestion, cough, bilateral side and chest pain as well as right leg pain. Patient has tried taking OTC cough medicine intermittently seems to help immediately, but the cough returns. She reports intermittent fevers at home "over 100", But no fevers today. No shortness of breath or nausea or vomiting, abdominal pain, diarrhea or constipation, urinary symptoms, numbness or weakness.  Past Medical History  Diagnosis Date  . Hiatal hernia   . Morbid obesity 2010  . Esophageal reflux   . Allergic rhinitis, cause unspecified   . Unspecified asthma(493.90)   . Sarcoidosis 1996    @ [employer]  . Iron deficiency anemia   . Syncope     ?? Syncope ??  . Ejection fraction     EF 45-50%, echo, October 27, 2011  . H/O menorrhagia 2011  . Endometrial mass 2011  . Urinary incontinence, mixed 2011  . Simple endometrial hyperplasia 2011  . Herpes simplex type 2 infection complicating pregnancy   . ASCUS with positive high risk HPV   . VIN II (vulvar intraepithelial neoplasia II)   . Fibroid   . Anxiety   . H/O Clostridium difficile infection 03/2010  . Adenomatous colon polyp   . Blood transfusion 1976    Due to ectopic pregnancy  . Abnormal Pap smear 2008  . Obstructive sleep apnea (adult) (pediatric)     cpap  . Pneumonia 2005; 2007; 2009; 2011  . Type 2 diabetes, diet controlled   . Spinal stenosis, lumbar     s/p decompression laminectomy 09/2013  . Chronic  back pain   . Panic attacks    Past Surgical History  Procedure Laterality Date  . Vulvectomy  ~ 2007    "for cancerous cells" (09/05/2013)  . Ectopic pregnancy surgery  1976; ~ 1978  . Urethral sling  2011  . Dilation and curettage of uterus  2007  . Posterior laminectomy / decompression lumbar spine  09/05/2013  . Thoracic laminectomy  09/05/2013  . Posterior lumbar fusion  09/05/2013  . Hysteroscopy diagnostic  2011  . Colposcopy vulva w/ biopsy      "I've had maybe 3" (09/05/2013)  . Maximum access (mas)posterior lumbar interbody fusion (plif) 1 level N/A 09/05/2013    Procedure: Lumbar four-five Maximum Access Surgery  Posterior lumbar interbody fusion;  Surgeon: Eustace Moore, MD;  Location: [facility];  Service: Neurosurgery;  Laterality: N/A;  Lumbar four-five Maximum Access Surgery  Posterior lumbar interbody fusion  . Lumbar laminectomy/decompression microdiscectomy N/A 09/05/2013    Procedure: Thoracic eleven-twelve Thoracic Laminectomy;  Surgeon: Eustace Moore, MD;  Location: [facility];  Service: Neurosurgery;  Laterality: N/A;  Thoracic eleven-twelve Thoracic Laminectomy   Family History  Problem Relation Age of Onset  . Heart attack Father   . Hypertension Father   . Diabetes Father   . Seizures Father   . Migraines Father   . Kidney disease Sister   . Osteoporosis Sister   .  Heart attack Sister   . Asthma Daughter   . Asthma Son   . Osteoporosis Sister   . Lung cancer Maternal Uncle   . Cancer Paternal Uncle   . Kidney disease Other   . Diabetes Other   . High Cholesterol Brother     x2   Social History  Substance Use Topics  . Smoking status: Never Smoker   . Smokeless tobacco: Never Used  . Alcohol Use: Yes     Comment: 09/05/2013 "might have a glass of wine maybe q couple months"   OB History    Gravida Para Term Preterm AB TAB SAB Ectopic Multiple Living   8         2     Review of Systems A 10 point review of systems was completed and was  negative except for pertinent positives and negatives as mentioned in the history of present illness    Allergies  Aspirin; Ibuprofen; and Latex  Home Medications   Prior to Admission medications   Medication Sig Start Date End Date Taking? Authorizing Provider  albuterol (PROVENTIL HFA;VENTOLIN HFA) 108 (90 BASE) MCG/ACT inhaler Inhale 2 puffs into the lungs every 6 (six) hours as needed for wheezing or shortness of breath.   Yes Historical Provider, MD  ALPRAZolam Duanne Moron) 1 MG tablet TAKE 1 TABLET TWICE A DAY AS NEEDED FOR SLEEP OR ANXIETY 03/04/15  Yes Rowe Clack, MD  Coenzyme Q10 (CO Q 10) 100 MG CAPS Take 1 capsule by mouth daily.   Yes Historical Provider, MD  dexlansoprazole (DEXILANT) 60 MG capsule Take 1 capsule (60 mg total) by mouth daily. 03/26/15  Yes Ladene Artist, MD  HYDROmorphone (DILAUDID) 4 MG tablet Take 4 mg by mouth 2 (two) times daily as needed for severe pain.   Yes Historical Provider, MD  hydroquinone 4 % cream Apply topically 2 (two) times daily. 05/06/14  Yes Rowe Clack, MD  mometasone (ASMANEX 60 METERED DOSES) 220 MCG/INH inhaler Inhale 2 puffs into the lungs daily as needed. Patient taking differently: Inhale 2 puffs into the lungs daily as needed (shortness of breath).  07/09/15  Yes Rowe Clack, MD  montelukast (SINGULAIR) 10 MG tablet TAKE 1 TABLET (10 MG TOTAL) BY MOUTH AT BEDTIME. 11/15/14  Yes Chesley Mires, MD  nebivolol (BYSTOLIC) 5 MG tablet Take 1 tablet (5 mg total) by mouth daily. 05/12/15  Yes Rowe Clack, MD  nystatin (MYCOSTATIN/NYSTOP) 100000 UNIT/GM POWD Apply 1 g topically 3 (three) times daily. 07/09/15  Yes Rowe Clack, MD  olmesartan-hydrochlorothiazide ([name] HCT) 40-25 MG per tablet Take 1 tablet by mouth daily. 02/25/15  Yes Rowe Clack, MD  polyethylene glycol powder (GLYCOLAX/MIRALAX) powder TAKE 17 G BY MOUTH 2 (TWO) TIMES DAILY Patient taking differently: Take 1 Container by mouth daily as needed for  moderate constipation. TAKE 17 G BY MOUTH 2 (TWO) TIMES DAILY 03/26/15  Yes Ladene Artist, MD  QVAR 80 MCG/ACT inhaler INHALE 2 PUFFS INTO THE LUNGS 2 (TWO) TIMES DAILY. 06/13/15  Yes Chesley Mires, MD  ranitidine (ZANTAC) 300 MG tablet TAKE 1 TABLET (300 MG TOTAL) BY MOUTH AT BEDTIME. 03/26/15  Yes Ladene Artist, MD  tolterodine (DETROL LA) 4 MG 24 hr capsule TAKE 1 CAPSULE(S) EVERY DAY BY ORAL ROUTE FOR 60 DAYS. 06/16/15  Yes Historical Provider, MD  Vitamin D, Ergocalciferol, (DRISDOL) 50000 UNITS CAPS capsule TAKE 1 CAPSULE(S) EVERY WEEK BY ORAL ROUTE FOR 84 DAYS. 05/09/15  Yes Historical Provider,  MD  albuterol (PROVENTIL) (2.5 MG/3ML) 0.083% nebulizer solution Take 3 mLs (2.5 mg total) by nebulization every 6 (six) hours as needed for shortness of breath. 07/15/14   Chesley Mires, MD  azithromycin (ZITHROMAX) 250 MG tablet Take 1 tablet (250 mg total) by mouth daily. Take first 2 tablets together, then 1 every day until finished. 08/11/15   Comer Locket, PA-C  medroxyPROGESTERone (PROVERA) 10 MG tablet Take 10 mg by mouth See admin instructions. Take 10mg  daily for 14 days Every 3 months. 07/27/12   Everett Graff, MD  predniSONE (DELTASONE) 20 MG tablet 3 tabs po day one, then 2 po daily x 4 days 08/11/15   Comer Locket, PA-C  PROCTOSOL HC 2.5 % rectal cream APPLY RECTALLY 2 (TWO) TIMES DAILY. 06/27/15   Olga Millers, MD  SYRINGE/NEEDLE, DISP, 1 ML (B-D SYRINGE/NEEDLE 1CC/25GX5/8) 25G X 5/8" 1 ML MISC Use 1 monthly to administer her b12 injection 01/30/13   Rowe Clack, MD   BP 129/69 mmHg  Pulse 73  Temp(Src) 98 F (36.7 C) (Oral)  Resp 18  Ht 5' 4.25" (1.632 m)  Wt 264 lb (119.75 kg)  BMI 44.96 kg/m2  SpO2 97%  LMP 02/14/2014 Physical Exam  Constitutional: She is oriented to person, place, and time. She appears well-developed and well-nourished.  HENT:  Head: Normocephalic and atraumatic.  Mouth/Throat: Oropharynx is clear and moist.  Eyes: Conjunctivae are normal. Pupils  are equal, round, and reactive to light. Right eye exhibits no discharge. Left eye exhibits no discharge. No scleral icterus.  Neck: Neck supple.  Cardiovascular: Normal rate, regular rhythm and normal heart sounds.   Pulmonary/Chest: Effort normal and breath sounds normal. No respiratory distress. She has no wheezes. She has no rales.  Abdominal: Soft. There is no tenderness.  Musculoskeletal: Normal range of motion. She exhibits no edema or tenderness.  Neurological: She is alert and oriented to person, place, and time.  Cranial Nerves II-XII grossly intact  Skin: Skin is warm and dry. No rash noted.  Psychiatric: She has a normal mood and affect.  Nursing note and vitals reviewed.   ED Course  Procedures (including critical care time) Labs Review Labs Reviewed  CBC - Abnormal; Notable for the following:    WBC 3.3 (*)    RBC 3.56 (*)    Hemoglobin 10.5 (*)    HCT 32.6 (*)    All other components within normal limits  BASIC METABOLIC PANEL - Abnormal; Notable for the following:    Glucose, Bld 104 (*)    All other components within normal limits    Imaging Review Dg Chest 2 View  08/11/2015   CLINICAL DATA:  Shortness breath, lower chest pain for 2 weeks  EXAM: CHEST  2 VIEW  COMPARISON:  CT chest 01/25/2015  FINDINGS: There is bilateral chronic interstitial lung disease consistent with patient's known history of sarcoidosis. There is no focal consolidation. There is no pleural effusion or pneumothorax. The heart and mediastinal contours are unremarkable.  The osseous structures are unremarkable.  IMPRESSION: No acute cardiopulmonary disease.  Chronic interstitial lung disease consistent with sarcoidosis.   Electronically Signed   By: Kathreen Devoid   On: 08/11/2015 12:03   I have personally reviewed and evaluated these images and lab results as part of my medical decision-making.   EKG Interpretation None     Meds given in ED:  Medications  HYDROcodone-acetaminophen  (NORCO/VICODIN) 5-325 MG per tablet 2 tablet (2 tablets Oral Given 08/11/15 1348)  New Prescriptions   AZITHROMYCIN (ZITHROMAX) 250 MG TABLET    Take 1 tablet (250 mg total) by mouth daily. Take first 2 tablets together, then 1 every day until finished.   PREDNISONE (DELTASONE) 20 MG TABLET    3 tabs po day one, then 2 po daily x 4 days   Filed Vitals:   08/11/15 1400 08/11/15 1415 08/11/15 1430 08/11/15 1445  BP: 115/62 100/54 121/69 129/69  Pulse: 71 71 72 73  Temp:      TempSrc:      Resp: 20 18 18    Height:      Weight:      SpO2: 98% 98% 100% 97%    MDM  Vitals stable - WNL -afebrile Pt resting comfortably in ED. PE--benign lung exam. Physical exam otherwise not concerning. Labwork--labs are abnormal, however appear to be baseline for patient. Imaging--chest x-ray shows chronic sarcoidosis with no other acute cardiopulmonary pathology.  DDX--discussed patient presentation and ED course my attending who also saw and evaluated the patient. During her interview patient reports gross hemoptysis. Will obtain CT angiogram of chest to effectively rule out PE. Patient's symptoms most likely due to viral process, pending further analysis with CT angiogram. Patient care will be signed out to oncoming provider, Michelene Gardener, PA-C, and If no new objective findings I feel the patient is stable to be discharged home.  I discussed all relevant lab findings and imaging results with pt and they verbalized understanding. Discussed f/u with PCP within 48 hrs and return precautions, pt very amenable to plan.  Final diagnoses:  Cough  URI (upper respiratory infection)        Comer Locket, PA-C 08/11/15 [place], MD 08/12/15 1452

## 2015-08-11 NOTE — Discharge Instructions (Signed)
There does not appear to be an emergent cause for your symptoms at this time. You'll be treated with an antibiotic and steroid to help with your symptoms. Please follow-up with your PCP this week for reevaluation. Return to ED for worsening symptoms.  Cough, Adult  A cough is a reflex that helps clear your throat and airways. It can help heal the body or may be a reaction to an irritated airway. A cough may only last 2 or 3 weeks (acute) or may last more than 8 weeks (chronic).  CAUSES Acute cough:  Viral or bacterial infections. Chronic cough:  Infections.  Allergies.  Asthma.  Post-nasal drip.  Smoking.  Heartburn or acid reflux.  Some medicines.  Chronic lung problems (COPD).  Cancer. SYMPTOMS   Cough.  Fever.  Chest pain.  Increased breathing rate.  High-pitched whistling sound when breathing (wheezing).  Colored mucus that you cough up (sputum). TREATMENT   A bacterial cough may be treated with antibiotic medicine.  A viral cough must run its course and will not respond to antibiotics.  Your caregiver may recommend other treatments if you have a chronic cough. HOME CARE INSTRUCTIONS   Only take over-the-counter or prescription medicines for pain, discomfort, or fever as directed by your caregiver. Use cough suppressants only as directed by your caregiver.  Use a cold steam vaporizer or humidifier in your bedroom or home to help loosen secretions.  Sleep in a semi-upright position if your cough is worse at night.  Rest as needed.  Stop smoking if you smoke. SEEK IMMEDIATE MEDICAL CARE IF:   You have pus in your sputum.  Your cough starts to worsen.  You cannot control your cough with suppressants and are losing sleep.  You begin coughing up blood.  You have difficulty breathing.  You develop pain which is getting worse or is uncontrolled with medicine.  You have a fever. MAKE SURE YOU:   Understand these instructions.  Will watch your  condition.  Will get help right away if you are not doing well or get worse. Document Released: 05/21/2011 Document Revised: 02/14/2012 Document Reviewed: 05/21/2011 [facility] Patient Information 2015 [place], [place]. This information is not intended to replace advice given to you by your health care provider. Make sure you discuss any questions you have with your health care provider.  Upper Respiratory Infection, Adult An upper respiratory infection (URI) is also sometimes known as the common cold. The upper respiratory tract includes the nose, sinuses, throat, trachea, and bronchi. Bronchi are the airways leading to the lungs. Most people improve within 1 week, but symptoms can last up to 2 weeks. A residual cough may last even longer.  CAUSES Many different viruses can infect the tissues lining the upper respiratory tract. The tissues become irritated and inflamed and often become very moist. Mucus production is also common. A cold is contagious. You can easily spread the virus to others by oral contact. This includes kissing, sharing a glass, coughing, or sneezing. Touching your mouth or nose and then touching a surface, which is then touched by another person, can also spread the virus. SYMPTOMS  Symptoms typically develop 1 to 3 days after you come in contact with a cold virus. Symptoms vary from person to person. They may include:  Runny nose.  Sneezing.  Nasal congestion.  Sinus irritation.  Sore throat.  Loss of voice (laryngitis).  Cough.  Fatigue.  Muscle aches.  Loss of appetite.  Headache.  Low-grade fever. DIAGNOSIS  You might  diagnose your own cold based on familiar symptoms, since most people get a cold 2 to 3 times a year. Your caregiver can confirm this based on your exam. Most importantly, your caregiver can check that your symptoms are not due to another disease such as strep throat, sinusitis, pneumonia, asthma, or epiglottitis. Blood tests, throat tests, and  X-rays are not necessary to diagnose a common cold, but they may sometimes be helpful in excluding other more serious diseases. Your caregiver will decide if any further tests are required. RISKS AND COMPLICATIONS  You may be at risk for a more severe case of the common cold if you smoke cigarettes, have chronic heart disease (such as heart failure) or lung disease (such as asthma), or if you have a weakened immune system. The very young and very old are also at risk for more serious infections. Bacterial sinusitis, middle ear infections, and bacterial pneumonia can complicate the common cold. The common cold can worsen asthma and chronic obstructive pulmonary disease (COPD). Sometimes, these complications can require emergency medical care and may be life-threatening. PREVENTION  The best way to protect against getting a cold is to practice good hygiene. Avoid oral or hand contact with people with cold symptoms. Wash your hands often if contact occurs. There is no clear evidence that vitamin C, vitamin E, echinacea, or exercise reduces the chance of developing a cold. However, it is always recommended to get plenty of rest and practice good nutrition. TREATMENT  Treatment is directed at relieving symptoms. There is no cure. Antibiotics are not effective, because the infection is caused by a virus, not by bacteria. Treatment may include:  Increased fluid intake. Sports drinks offer valuable electrolytes, sugars, and fluids.  Breathing heated mist or steam (vaporizer or shower).  Eating chicken soup or other clear broths, and maintaining good nutrition.  Getting plenty of rest.  Using gargles or lozenges for comfort.  Controlling fevers with ibuprofen or acetaminophen as directed by your caregiver.  Increasing usage of your inhaler if you have asthma. Zinc gel and zinc lozenges, taken in the first 24 hours of the common cold, can shorten the duration and lessen the severity of symptoms. Pain  medicines may help with fever, muscle aches, and throat pain. A variety of non-prescription medicines are available to treat congestion and runny nose. Your caregiver can make recommendations and may suggest nasal or lung inhalers for other symptoms.  HOME CARE INSTRUCTIONS   Only take over-the-counter or prescription medicines for pain, discomfort, or fever as directed by your caregiver.  Use a warm mist humidifier or inhale steam from a shower to increase air moisture. This may keep secretions moist and make it easier to breathe.  Drink enough water and fluids to keep your urine clear or pale yellow.  Rest as needed.  Return to work when your temperature has returned to normal or as your caregiver advises. You may need to stay home longer to avoid infecting others. You can also use a face mask and careful hand washing to prevent spread of the virus. SEEK MEDICAL CARE IF:   After the first few days, you feel you are getting worse rather than better.  You need your caregiver's advice about medicines to control symptoms.  You develop chills, worsening shortness of breath, or brown or red sputum. These may be signs of pneumonia.  You develop yellow or brown nasal discharge or pain in the face, especially when you bend forward. These may be signs of sinusitis.  You develop a fever, swollen neck glands, pain with swallowing, or white areas in the back of your throat. These may be signs of strep throat. SEEK IMMEDIATE MEDICAL CARE IF:   You have a fever.  You develop severe or persistent headache, ear pain, sinus pain, or chest pain.  You develop wheezing, a prolonged cough, cough up blood, or have a change in your usual mucus (if you have chronic lung disease).  You develop sore muscles or a stiff neck. Document Released: 05/18/2001 Document Revised: 02/14/2012 Document Reviewed: 02/27/2014 [facility] Patient Information 2015 [place], [place]. This information is not intended to replace  advice given to you by your health care provider. Make sure you discuss any questions you have with your health care provider.

## 2015-08-11 NOTE — ED Notes (Signed)
Family at bedside. 

## 2015-08-11 NOTE — ED Notes (Signed)
Patient transported to X-ray 

## 2015-08-11 NOTE — ED Notes (Signed)
Provided the patient's husband with a sandwich bag.

## 2015-08-11 NOTE — ED Notes (Signed)
MD James at the bedside  

## 2015-08-11 NOTE — ED Notes (Signed)
Pt A&OX4, ambulatory at d/c with steady gait, NAD, declined wheelchair. 

## 2015-08-11 NOTE — ED Provider Notes (Signed)
Spite multiple attempts by nursing, IV team, and myself peripheral IV access unable to be obtained. I really interviewed and reexamined the patient. She is not short of breath. She has a cough. Occasionally will have some traces of blood in it. But does not have marked pleuritic pain. She isn't hypoxemic at rest,  or with ambulation. She is not tachycardic.  D-dimer is obtained and is 0.51 which is normal with age adjustment for her. I discussed this with her. Plan will be home symptomatic treatment with cough suppressants pain medications prednisone antibiotics close follow-up return if worsening.  Tanna Furry, MD 08/11/15 [phone]

## 2015-08-15 ENCOUNTER — Encounter: Payer: Self-pay | Admitting: Pulmonary Disease

## 2015-08-15 ENCOUNTER — Ambulatory Visit (INDEPENDENT_AMBULATORY_CARE_PROVIDER_SITE_OTHER): Payer: Medicare Other | Admitting: Pulmonary Disease

## 2015-08-15 VITALS — BP 130/84 | HR 80 | Ht 64.0 in | Wt 264.6 lb

## 2015-08-15 DIAGNOSIS — J209 Acute bronchitis, unspecified: Secondary | ICD-10-CM

## 2015-08-15 DIAGNOSIS — J45998 Other asthma: Secondary | ICD-10-CM

## 2015-08-15 DIAGNOSIS — D860 Sarcoidosis of lung: Secondary | ICD-10-CM

## 2015-08-15 DIAGNOSIS — Z9989 Dependence on other enabling machines and devices: Secondary | ICD-10-CM

## 2015-08-15 DIAGNOSIS — IMO0002 Reserved for concepts with insufficient information to code with codable children: Secondary | ICD-10-CM

## 2015-08-15 DIAGNOSIS — G4733 Obstructive sleep apnea (adult) (pediatric): Secondary | ICD-10-CM

## 2015-08-15 MED ORDER — ALBUTEROL SULFATE (2.5 MG/3ML) 0.083% IN NEBU: 2.5000 mg | INHALATION_SOLUTION | Freq: Four times a day (QID) | RESPIRATORY_TRACT | Status: DC | PRN

## 2015-08-15 MED ORDER — DOXYCYCLINE HYCLATE 100 MG PO CAPS: 100.0000 mg | ORAL_CAPSULE | Freq: Two times a day (BID) | ORAL | Status: DC

## 2015-08-15 NOTE — Progress Notes (Signed)
Chief Complaint  Patient presents with  . Follow-up    Coughing up green,yellow mucus, sometimes has blood clots in sputum.  no chest tightness, but feels heavy.  no fever, but has had fever, was in ER on Monday,  08/11/2015 due to Sarcoid changes in Xray, damage to lungs.     History of Present Illness: Cynthia Kirk is a 64 y.o. female with Asthma, Sarcoidosis, OSA, Rhinitis, and Hoarseness.  She went to the ER on 08/11/15.  She was getting cough with green sputum and specks of blood.  She had fever 102.3.  She has been feeling tired and fatigued.  Her sinus are okay, and her sore throat has improved.  She has noticed headache for few weeks, and her symptoms did seem to start in her sinuses.  She has occasional nausea, and not as much of an appetite.  She has been using albuterol >> this helps, but doesn't last.  She was given script for doxycycline in ER.  She was also given tessalon >> not much help.  She did not fill script for prednisone >> she is concerned about weight gain with prednisone.  She is compliant with her CPAP.  TESTS: Spirometry 06/30/09 >> FEV1 1.69(87%), FVC 2.17(83%), FEV1% 78 PSG 03/06/14 >> AHI 1.9, REM AHI 26.7, SaO2 low 80%. PSG 05/28/14 >> AHI 5.8, SaO2 low 85%. REM AHI 29.3 CT chest 07/17/14 >> upper lobe predominant BTX, GGO, nodularity no change since 2011 PFT 11/15/14 >> FEV1 1.76 (87%), FEV1% 77, TLC 3.19 (63%), DLCO 74%, no BD CPAP 07/15/15 to 08/13/15 >> used on 30 of 30 nights with average 9 hrs and 21 min.  Average AHI is 0.5 with CPAP 10 cm H2O.   PMHx >> GERD, hiatal hernia, Anemia, Anxiety, Chronic back pain  PSHx, Medications, Allergies, Fhx, Shx reviewed.  Physical Exam: BP 130/84 mmHg  Pulse 80  Ht 5\' 4"  (1.626 m)  Wt 264 lb 9.6 oz (120.022 kg)  BMI 45.40 kg/m2  SpO2 99%  LMP 02/14/2014  General - No distress ENT - No sinus tenderness, no oral exudate, no LAN Cardiac - s1s2 regular, no murmur Chest - No wheeze/rales/dullness Back - No focal  tenderness Abd - Soft, non-tender Ext - No edema Neuro - Normal strength Skin - No rashes Psych - normal mood, and behavior   BMP Latest Ref Rng 08/11/2015 04/18/2015 01/25/2015  Glucose 65 - 99 mg/dL 104(H) 93 90  BUN 6 - 20 mg/dL 10 21 14   Creatinine 0.44 - 1.00 mg/dL 0.85 1.08 1.03  Sodium 135 - 145 mmol/L 139 136 137  Potassium 3.5 - 5.1 mmol/L 3.8 3.7 4.3  Chloride 101 - 111 mmol/L 106 101 102  CO2 22 - 32 mmol/L 27 31 28   Calcium 8.9 - 10.3 mg/dL 8.9 9.2 9.1    CBC Latest Ref Rng 08/11/2015 01/25/2015 01/25/2015  WBC 4.0 - 10.5 K/uL 3.3(L) 5.0 SPECIMEN CLOTTED  Hemoglobin 12.0 - 15.0 g/dL 10.5(L) 11.4(L) 11.8(L)  Hematocrit 36.0 - 46.0 % 32.6(L) 34.8(L) 36.3  Platelets 150 - 400 K/uL 193 223 SPECIMEN CLOTTED    Dg Chest 2 View  08/11/2015   CLINICAL DATA:  Shortness breath, lower chest pain for 2 weeks  EXAM: CHEST  2 VIEW  COMPARISON:  CT chest 01/25/2015  FINDINGS: There is bilateral chronic interstitial lung disease consistent with patient's known history of sarcoidosis. There is no focal consolidation. There is no pleural effusion or pneumothorax. The heart and mediastinal contours are unremarkable.  The osseous structures  are unremarkable.  IMPRESSION: No acute cardiopulmonary disease.  Chronic interstitial lung disease consistent with sarcoidosis.   Electronically Signed   By: Kathreen Devoid   On: 08/11/2015 12:03      Assessment/Plan: Acute bronchitis. She is slowly improving.  No clinical evidence for pneumonia. Plan: - will give her an additional 5 days of doxycycline to complete 10 days course - don't think she needs prednisone at this time - will arrange for home nebulizer for albuterol   Obstructive sleep apnea. She is compliant with therapy and reports benefit from CPAP. Plan: - continue CPAP 10 cm H2O  Asthma. Plan: - continue asmanex, singulair, and prn proair for now - re-assess whether she needs LABA added at next ROV  Pulmonary sarcoidosis. Stable  clinically and on CT chest from August 2015. Plan: - monitor clinically  Vaccinations.   She had pneumovax in April 2009. Plan: - she will need to get influenza vaccine once she has recovered from her acute illness - she will need repeat pneumovax in January 2017   Chesley Mires, MD [facility] Pager:  [phone] 08/15/2015, 10:48 AM

## 2015-08-15 NOTE — Patient Instructions (Signed)
Take doxycycline 100 mg twice per day for additional 5 days  Will arrange for home nebulizer machine  Follow up in 6 to 8 weeks with Dr. Halford Chessman or [facility]

## 2015-08-25 ENCOUNTER — Other Ambulatory Visit: Payer: Self-pay | Admitting: Pulmonary Disease

## 2015-08-25 ENCOUNTER — Other Ambulatory Visit: Payer: Self-pay | Admitting: Internal Medicine

## 2015-08-26 ENCOUNTER — Other Ambulatory Visit: Payer: Self-pay

## 2015-08-26 NOTE — Telephone Encounter (Signed)
Faxed script back to [facility],,,/lmb 

## 2015-08-26 NOTE — Telephone Encounter (Signed)
Done hardcopy to Dahlia  

## 2015-08-26 NOTE — Telephone Encounter (Signed)
MD out of office. Pls advise on refill.../lmb 

## 2015-09-04 ENCOUNTER — Other Ambulatory Visit: Payer: Self-pay | Admitting: Pulmonary Disease

## 2015-09-19 LAB — HM DIABETES EYE EXAM

## 2015-09-26 ENCOUNTER — Ambulatory Visit: Payer: Medicare Other | Admitting: Adult Health

## 2015-09-26 ENCOUNTER — Encounter: Payer: Self-pay | Admitting: Adult Health

## 2015-09-26 ENCOUNTER — Ambulatory Visit (INDEPENDENT_AMBULATORY_CARE_PROVIDER_SITE_OTHER): Payer: Medicare Other | Admitting: Adult Health

## 2015-09-26 VITALS — BP 116/62 | HR 80 | Temp 98.0°F | Ht 64.0 in | Wt 261.6 lb

## 2015-09-26 DIAGNOSIS — D860 Sarcoidosis of lung: Secondary | ICD-10-CM

## 2015-09-26 DIAGNOSIS — J4531 Mild persistent asthma with (acute) exacerbation: Secondary | ICD-10-CM | POA: Diagnosis not present

## 2015-09-26 MED ORDER — PREDNISONE 10 MG PO TABS: ORAL_TABLET | ORAL | Status: DC

## 2015-09-26 MED ORDER — AMOXICILLIN-POT CLAVULANATE 875-125 MG PO TABS: 1.0000 | ORAL_TABLET | Freq: Two times a day (BID) | ORAL | Status: AC

## 2015-09-26 MED ORDER — HYDROCODONE-HOMATROPINE 5-1.5 MG/5ML PO SYRP: 5.0000 mL | ORAL_SOLUTION | Freq: Four times a day (QID) | ORAL | Status: DC | PRN

## 2015-09-26 NOTE — Assessment & Plan Note (Signed)
Flare with uri   PLAN  Augmentin 875mg .Twice daily  For 7 days  Mucinex DM Twice daily  As needed  Cough/congestion  Prednisone taper over next week.  Hydromet 1 tsp every 6hr as needed As needed  Cough, may make you sleepy.  Please contact office for sooner follow up if symptoms do not improve or worsen or seek emergency care  follow up with Dr. Halford Chessman  In 3 months and As needed

## 2015-09-26 NOTE — Assessment & Plan Note (Signed)
Flare   PLAN    Prednisone taper over next week.  Hydromet 1 tsp every 6hr as needed As needed  Cough, may make you sleepy.  Please contact office for sooner follow up if symptoms do not improve or worsen or seek emergency care  follow up with Dr. Halford Chessman  In 3 months and As needed

## 2015-09-26 NOTE — Progress Notes (Signed)
Reviewed and agree with assessment/plan. 

## 2015-09-26 NOTE — Patient Instructions (Signed)
Augmentin 875mg .Twice daily  For 7 days  Mucinex DM Twice daily  As needed  Cough/congestion  Prednisone taper over next week.  Hydromet 1 tsp every 6hr as needed As needed  Cough, may make you sleepy.  Please contact office for sooner follow up if symptoms do not improve or worsen or seek emergency care  follow up with Dr. Halford Chessman  In 3 months and As needed

## 2015-09-26 NOTE — Progress Notes (Signed)
History of Present Illness: Cynthia Kirk is a 64 y.o. female with Asthma, Sarcoidosis, OSA, Rhinitis, and Hoarseness.  TESTS: Spirometry 06/30/09 >> FEV1 1.69(87%), FVC 2.17(83%), FEV1% 78 CPAP 12/25/12 to 01/23/13 >> Used on 20 of 30 nights with average 3 hrs 51 min. Average AHI 0.8 with CPAP 9 cm H2O. Minimal airleak. PSG 03/06/14 >> AHI 1.9, REM AHI 26.7, SaO2 low 80%. PSG 05/28/14 >> AHI 5.8, SaO2 low 85%. REM AHI 29.3 CT chest 07/17/14 >> upper lobe predominant BTX, GGO, nodularity no change since 2011 PFT 11/15/14 >> FEV1 1.76 (87%), FEV1% 77, TLC 3.19 (63%), DLCO 74%, no BD  09/26/2015 Acute OV : Sarcoid and Asthma  Patient presents for an acute office visit. Patient complains of 3 days of productive cough with thick yellow-green mucus with specks of blood. She is been having a barking cough with intermittent wheezing and tightness. Does feel achy. Patient says that she has felt feverish but has not checked her temperature. She is afebrile. In office today.  Patient has been and daughter have both been sick with viral upper respiratory symptoms. Patient had bronchitis 6 weeks ago was treated with antibiotics with total resolution of symptoms. Patient says she was totally better up until 3 days ago. Chest x-ray done last month showed chronic changes without acute process.   ROS :  Constitutional:   No  weight loss, night sweats,  Fevers, chills,  +fatigue, or  lassitude.  HEENT:   No headaches,  Difficulty swallowing,  Tooth/dental problems, or  Sore throat,                No sneezing, itching, ear ache, + nasal congestion, post nasal drip,   CV:  No chest pain,  Orthopnea, PND, swelling in lower extremities, anasarca, dizziness, palpitations, syncope.   GI  No heartburn, indigestion, abdominal pain, nausea, vomiting, diarrhea, change in bowel habits, loss of appetite, bloody stools.   Resp:   No chest wall deformity  Skin: no rash or lesions.  GU: no dysuria, change in color of  urine, no urgency or frequency.  No flank pain, no hematuria   MS:   + back pain.  Psych:  No change in mood or affect. No depression or anxiety.  No memory loss.       EXAM :  GEN: A/Ox3; pleasant , NAD, well nourished , obese   HEENT:  [place]/AT,  EACs-clear, TMs-wnl, NOSE-clear, THROAT-clear, no lesions, no postnasal drip or exudate noted.   NECK:  Supple w/ fair ROM; no JVD; normal carotid impulses w/o bruits; no thyromegaly or nodules palpated; no lymphadenopathy.No stridor  RESP  coarse breath sounds without wheezes/ rales/ or rhonchi. no accessory muscle use, no dullness to percussion. Speaks in full sentences.   CARD:  RRR, no m/r/g  , no peripheral edema, pulses intact, no cyanosis or clubbing., neg homans sign   GI:   Soft & nt; nml bowel sounds; no organomegaly or masses detected.  Musco: Warm bil, no deformities or joint swelling noted, tender along post neck   Neuro: alert, no focal deficits noted.    Skin: Warm, no lesions or rashes

## 2015-10-03 ENCOUNTER — Ambulatory Visit: Payer: Medicare Other | Admitting: Adult Health

## 2015-10-07 ENCOUNTER — Ambulatory Visit: Payer: Medicare Other | Admitting: Podiatry

## 2015-10-07 ENCOUNTER — Other Ambulatory Visit: Payer: Self-pay | Admitting: Internal Medicine

## 2015-10-11 ENCOUNTER — Other Ambulatory Visit: Payer: Self-pay

## 2015-10-11 MED ORDER — ALPRAZOLAM 1 MG PO TABS: 1.0000 mg | ORAL_TABLET | Freq: Two times a day (BID) | ORAL | Status: DC | PRN

## 2015-10-17 ENCOUNTER — Telehealth: Payer: Self-pay | Admitting: *Deleted

## 2015-10-17 NOTE — Telephone Encounter (Signed)
Left msg on triage stating saw md concerning weight loss. Was told to get with insurance to see what they will cover. Pt states her insurance will cover Victoza, but the rx has to go to [place]. Would like rx [facility] to [facility] [phone]...Johny Chess

## 2015-10-20 ENCOUNTER — Other Ambulatory Visit: Payer: Self-pay

## 2015-10-20 MED ORDER — POLYETHYLENE GLYCOL 3350 17 GM/SCOOP PO POWD: 1.0000 | Freq: Every day | ORAL | Status: DC | PRN

## 2015-10-20 MED ORDER — LIRAGLUTIDE 18 MG/3ML ~~LOC~~ SOPN: 1.8000 mg | PEN_INJECTOR | Freq: Every day | SUBCUTANEOUS | Status: DC

## 2015-10-20 NOTE — Telephone Encounter (Signed)
Pls advise on msg below.../lmb 

## 2015-10-21 ENCOUNTER — Ambulatory Visit: Payer: Medicare Other | Admitting: Internal Medicine

## 2015-10-22 MED ORDER — LIRAGLUTIDE -WEIGHT MANAGEMENT 18 MG/3ML ~~LOC~~ SOPN: 0.6000 mg | PEN_INJECTOR | Freq: Every day | SUBCUTANEOUS | Status: DC

## 2015-10-22 NOTE — Telephone Encounter (Signed)
Notified pt with md response.../lmb 

## 2015-10-22 NOTE — Telephone Encounter (Signed)
"  victoza" is only for diabetes mellitus - the same medication when prescribed for weight loss is called "Saxenda" Saxenda rx sent to her [facility] as requested - OV in 1 month with new PCP to recheck weight and titrate dose as needed thanks

## 2015-10-23 ENCOUNTER — Telehealth: Payer: Self-pay | Admitting: Internal Medicine

## 2015-10-23 NOTE — Telephone Encounter (Signed)
Pt called to inform that there will be PA paper work coming over for our office to fill out for [employer]. Please help

## 2015-10-23 NOTE — Telephone Encounter (Signed)
FYI: if pt calls back, please let her know that when we know something about the PA we will call and let her know. PA may be started today. I will document to inform when it is started.

## 2015-10-24 ENCOUNTER — Encounter: Payer: Self-pay | Admitting: Internal Medicine

## 2015-10-28 NOTE — Telephone Encounter (Signed)
Pt called back and they have not yet received th PA from [place]. They told her we can call them at [phone] and they can approve over the phone. They also said to be sure to request authorization for 2016 & 2017.  Can you please call pt to let her know whats going on.

## 2015-10-28 NOTE — Telephone Encounter (Signed)
PA has not been started. I will inform pt when PA is started.

## 2015-10-28 NOTE — Telephone Encounter (Signed)
Informed pt that I will start PA as soon as I get to her.

## 2015-10-29 ENCOUNTER — Telehealth: Payer: Self-pay | Admitting: Internal Medicine

## 2015-10-29 NOTE — Telephone Encounter (Signed)
Will inform pt that we will let her know what the determination is.

## 2015-10-29 NOTE — Telephone Encounter (Signed)
PA initiated via faxed questionnaire to plan for determination.   Pt informed of same.

## 2015-10-29 NOTE — Telephone Encounter (Signed)
Transferred below information to the original phone note.   Closing this note.

## 2015-10-29 NOTE — Telephone Encounter (Signed)
[place] sent below message:    Expand All Collapse All   Pt called in and has some questions about a PA that is in process. She wanted to make sure she was clear on the conversation you had with her yesterday    Best number [phone]

## 2015-10-29 NOTE — Telephone Encounter (Signed)
Pt called in and has some questions about a PA that is in process. She wanted to make sure she was clear on the conversation you had with her yesterday    Best number [phone]

## 2015-11-03 ENCOUNTER — Ambulatory Visit: Payer: Medicare Other | Admitting: Internal Medicine

## 2015-11-04 NOTE — Telephone Encounter (Signed)
PA for Kirke Shaggy is denied.   Is there other formulary or alternative for pt?

## 2015-11-05 NOTE — Telephone Encounter (Signed)
Pt informed of the determination.

## 2015-11-05 NOTE — Telephone Encounter (Signed)
Pt is welcome to check with her insurance formulary what meds if any are covered for weight loss - then let us know same Happy to prescribe based on these suggestions, though many insurance policy do not cover this class of meds thanks

## 2015-11-11 ENCOUNTER — Ambulatory Visit: Payer: Medicare Other | Admitting: Podiatry

## 2015-12-05 ENCOUNTER — Ambulatory Visit: Payer: Medicare Other | Admitting: Internal Medicine

## 2015-12-06 ENCOUNTER — Other Ambulatory Visit: Payer: Self-pay | Admitting: Internal Medicine

## 2015-12-26 ENCOUNTER — Ambulatory Visit: Payer: Medicare Other | Admitting: Pulmonary Disease

## 2016-01-02 ENCOUNTER — Ambulatory Visit (INDEPENDENT_AMBULATORY_CARE_PROVIDER_SITE_OTHER): Payer: Medicare Other | Admitting: Internal Medicine

## 2016-01-02 ENCOUNTER — Ambulatory Visit: Payer: Medicare Other | Admitting: Internal Medicine

## 2016-01-02 VITALS — BP 114/80 | HR 75 | Temp 98.5°F | Resp 18 | Wt 267.0 lb

## 2016-01-02 DIAGNOSIS — E119 Type 2 diabetes mellitus without complications: Secondary | ICD-10-CM

## 2016-01-02 DIAGNOSIS — I10 Essential (primary) hypertension: Secondary | ICD-10-CM | POA: Diagnosis not present

## 2016-01-02 DIAGNOSIS — Z23 Encounter for immunization: Secondary | ICD-10-CM | POA: Diagnosis not present

## 2016-01-02 DIAGNOSIS — K219 Gastro-esophageal reflux disease without esophagitis: Secondary | ICD-10-CM

## 2016-01-02 DIAGNOSIS — J069 Acute upper respiratory infection, unspecified: Secondary | ICD-10-CM

## 2016-01-02 MED ORDER — LIRAGLUTIDE 18 MG/3ML ~~LOC~~ SOPN: PEN_INJECTOR | SUBCUTANEOUS | Status: DC

## 2016-01-02 MED ORDER — HYDROCOD POLST-CPM POLST ER 10-8 MG/5ML PO SUER: 5.0000 mL | Freq: Two times a day (BID) | ORAL | Status: DC | PRN

## 2016-01-02 MED ORDER — HYDROQUINONE 4 % EX CREA: TOPICAL_CREAM | Freq: Two times a day (BID) | CUTANEOUS | Status: DC

## 2016-01-02 NOTE — Patient Instructions (Addendum)
  We will start D[place] for your diabetes and weight.  If you have any side effects please call.  We will also give you a prescription for cough syrup.    Your prescription(s) have been submitted to your pharmacy. Please take as directed and contact our office if you believe you are having problem(s) with the medication(s).   Please schedule followup in about 6 weeks

## 2016-01-02 NOTE — Progress Notes (Signed)
Pre visit review using our clinic review tool, if applicable. No additional management support is needed unless otherwise documented below in the visit note. 

## 2016-01-02 NOTE — Progress Notes (Addendum)
Subjective:    Patient ID: Cynthia Kirk, female    DOB: 1951/06/28, 65 y.o.   MRN: [id number]  HPI She is here to establish with a new pcp.   Cold symptoms:  Her symptoms started three days ago.  She has a runny nose, eyes running, she was coughing and it was productive, the phlegm was bubbly.    She is also concerned about her weight as well.  She knows she needs to lose weight.  She is currently not exercising.  She does not always control how much she is eating and she is a emotional eater.  She was prescribed victoza for weight loss because she also has diabetes, but she did not think she got the correct prescription and did not start it.    Diabetes, Obesity: She is currently not on any medication daily. She is somewhat compliant with a diabetic diet. She is not exercising regularly. She monitors her sugars and they have been running 130 - 160 fasting. She checks her feet daily and denies foot lesions. She is up-to-date with an ophthalmology examination.   Hypertension: She is taking her medication daily. She is compliant with a low sodium diet.  She denies chest pain, palpitations, edema and regular headaches. She has chronic shortness of breath from her sarcoidosis and is at her baseline.  She is not exercising regularly.  She does not monitor her blood pressure at home.    GERD:  She is taking her medication daily as prescribed.  She denies any GERD symptoms and feels her GERD is well controlled.   Pulmonary sarcoidosis:  She follows with pulmonary.  She has chronic SIB.  She uses albuterol as needed and asmanex as needed too.  She denies chronic wheeze.   She typically has a slight cough  Medications and allergies reviewed with patient and updated if appropriate.  Patient Active Problem List   Diagnosis Date Noted  . Routine general medical examination at a health care facility 04/18/2015  . B12 deficiency   . Lumbar spinal stenosis 03/07/2013  . Ejection fraction   .  Syncope   . Severe obesity (BMI >= 40) ([place]) 09/14/2010  . Essential hypertension 09/14/2010  . ANEMIA-IRON DEFICIENCY 03/20/2010  . Menopausal and postmenopausal disorder 03/20/2010  . GERD 08/22/2009  . Pulmonary sarcoidosis ([place]) 06/30/2009  . Diabetes mellitus type 2, diet-controlled ([place]) 06/30/2009  . OSA on CPAP 06/30/2009  . Upper airway cough syndrome 06/30/2009  . Mild persistent asthma 06/30/2009    Current Outpatient Prescriptions on File Prior to Visit  Medication Sig Dispense Refill  . albuterol (PROVENTIL HFA;VENTOLIN HFA) 108 (90 BASE) MCG/ACT inhaler Inhale 2 puffs into the lungs every 6 (six) hours as needed for wheezing or shortness of breath.    [name] albuterol (PROVENTIL) (2.5 MG/3ML) 0.083% nebulizer solution Take 3 mLs (2.5 mg total) by nebulization every 6 (six) hours as needed for shortness of breath. 75 mL 5  . ALPRAZolam (XANAX) 1 MG tablet Take 1 tablet (1 mg total) by mouth 2 (two) times daily as needed for anxiety. 60 tablet 5  . ASMANEX 60 METERED DOSES 220 MCG/INH inhaler INHALE 2 PUFFS INTO THE LUNGS DAILY. 1 Inhaler 6  . BYSTOLIC 5 MG tablet TAKE 1 TABLET BY MOUTH DAILY 30 tablet 6  . Coenzyme Q10 (CO Q 10) 100 MG CAPS Take 1 capsule by mouth daily.    [name] dexlansoprazole (DEXILANT) 60 MG capsule Take 1 capsule (60 mg total) by mouth daily. [place]  capsule 11  . fluconazole (DIFLUCAN) 150 MG tablet TAKE 1 TABLET (150 MG TOTAL) BY MOUTH ONCE. MAY REPEAT DOSE NEXT DAY IF NEEDED 2 tablet 0  . hydroquinone 4 % cream Apply topically 2 (two) times daily. 28.35 g 0  . Liraglutide (VICTOZA) 18 MG/3ML SOPN Inject 0.3 mLs (1.8 mg total) into the skin daily. 6 pen 1  . Liraglutide -Weight Management (SAXENDA) 18 MG/3ML SOPN Inject 0.6 mg into the skin daily. X 1 week, then 1.2mg  SQ daily x 1 week, then 1.8 mg SQ daily until follow up visit with MD 9 mL 0  . medroxyPROGESTERone (PROVERA) 10 MG tablet Take 10 mg by mouth See admin instructions. Take 10mg  daily for 14 days Every 3  months.    . montelukast (SINGULAIR) 10 MG tablet TAKE 1 TABLET (10 MG TOTAL) BY MOUTH AT BEDTIME. 30 tablet 5  . nystatin cream (MYCOSTATIN) APPLY 1 G TOPICALLY 3 (THREE) TIMES DAILY. 60 g 0  . olmesartan-hydrochlorothiazide ([name] HCT) 40-25 MG per tablet Take 1 tablet by mouth daily. 30 tablet 11  . polyethylene glycol powder (GLYCOLAX/MIRALAX) powder Take 255 g by mouth daily as needed for moderate constipation. 527 g 11  . PROCTOSOL HC 2.5 % rectal cream APPLY RECTALLY 2 (TWO) TIMES DAILY. 28.35 g 0  . ranitidine (ZANTAC) 300 MG tablet TAKE 1 TABLET (300 MG TOTAL) BY MOUTH AT BEDTIME. 30 tablet 11  . tolterodine (DETROL LA) 4 MG 24 hr capsule TAKE 1 CAPSULE(S) EVERY DAY BY ORAL ROUTE FOR 60 DAYS.  3   No current facility-administered medications on file prior to visit.    Past Medical History  Diagnosis Date  . Hiatal hernia   . Morbid obesity ([place]) 2010  . Esophageal reflux   . Allergic rhinitis, cause unspecified   . Unspecified asthma(493.90)   . Sarcoidosis [facility]) 1996    @ [place]  . Iron deficiency anemia   . Syncope     ?? Syncope ??  . Ejection fraction     EF 45-50%, echo, October 27, 2011  . H/O menorrhagia 2011  . Endometrial mass 2011  . Urinary incontinence, mixed 2011  . Simple endometrial hyperplasia 2011  . Herpes simplex type 2 infection complicating pregnancy   . ASCUS with positive high risk HPV   . VIN II (vulvar intraepithelial neoplasia II)   . Fibroid   . Anxiety   . H/O Clostridium difficile infection 03/2010  . Adenomatous colon polyp   . Blood transfusion 1976    Due to ectopic pregnancy  . Abnormal Pap smear 2008  . Obstructive sleep apnea (adult) (pediatric)     cpap  . Pneumonia 2005; 2007; 2009; 2011  . Type 2 diabetes, diet controlled ([place])   . Spinal stenosis, lumbar     s/p decompression laminectomy 09/2013  . Chronic back pain   . Panic attacks     Past Surgical History  Procedure Laterality Date  . Vulvectomy  ~ 2007    "for  cancerous cells" (09/05/2013)  . Ectopic pregnancy surgery  1976; ~ 1978  . Urethral sling  2011  . Dilation and curettage of uterus  2007  . Posterior laminectomy / decompression lumbar spine  09/05/2013  . Thoracic laminectomy  09/05/2013  . Posterior lumbar fusion  09/05/2013  . Hysteroscopy diagnostic  2011  . Colposcopy vulva w/ biopsy      "I've had maybe 3" (09/05/2013)  . Maximum access (mas)posterior lumbar interbody fusion (plif) 1 level N/A 09/05/2013  Procedure: Lumbar four-five Maximum Access Surgery  Posterior lumbar interbody fusion;  Surgeon: Eustace Moore, MD;  Location: [place] [facility];  Service: Neurosurgery;  Laterality: N/A;  Lumbar four-five Maximum Access Surgery  Posterior lumbar interbody fusion  . Lumbar laminectomy/decompression microdiscectomy N/A 09/05/2013    Procedure: Thoracic eleven-twelve Thoracic Laminectomy;  Surgeon: Eustace Moore, MD;  Location: [place] NEURO ORS;  Service: Neurosurgery;  Laterality: N/A;  Thoracic eleven-twelve Thoracic Laminectomy    Social History   Social History  . Marital Status: Married    Spouse Name: N/A  . Number of Children: 2  . Years of Education: PHD   Occupational History  . retired   .     Social History Main Topics  . Smoking status: Never Smoker   . Smokeless tobacco: Never Used  . Alcohol Use: Yes     Comment: 09/05/2013 "might have a glass of wine maybe q couple months"  . Drug Use: No  . Sexual Activity: Yes    Birth Control/ Protection: Post-menopausal   Other Topics Concern  . Not on file   Social History Narrative   Pt lives at home with her spouse.   She does not use caffeine.    Family History  Problem Relation Age of Onset  . Heart attack Father   . Hypertension Father   . Diabetes Father   . Seizures Father   . Migraines Father   . Kidney disease Sister   . Osteoporosis Sister   . Heart attack Sister   . Asthma Daughter   . Asthma Son   . Osteoporosis Sister   . Lung cancer Maternal Uncle     . Cancer Paternal Uncle   . Kidney disease Other   . Diabetes Other   . High Cholesterol Brother     x2    Review of Systems  Constitutional: Positive for chills. Negative for fever.  HENT: Positive for postnasal drip, rhinorrhea and sore throat. Negative for congestion, ear pain and sinus pressure.   Respiratory: Positive for cough (productive) and shortness of breath (sarcoidosis - chronic). Wheezing: occasional with baseline, none now.   Cardiovascular: Positive for palpitations (intermittent). Negative for chest pain and leg swelling.  Gastrointestinal:       GERD controlled  Genitourinary: Positive for flank pain.  Neurological: Positive for headaches. Negative for dizziness and light-headedness.       Objective:   Filed Vitals:   01/02/16 1550  BP: 114/80  Pulse: 75  Temp: 98.5 F (36.9 C)  Resp: 18   Filed Weights   01/02/16 1550  Weight: 267 lb (121.11 kg)   Body mass index is 45.81 kg/(m^2).   Physical Exam GENERAL APPEARANCE: Appears stated age, well appearing, NAD EYES: conjunctiva clear, no icterus HEENT: bilateral tympanic membranes and ear canals normal, oropharynx with mild erythema, no thyromegaly, trachea midline, no cervical or supraclavicular lymphadenopathy LUNGS: Clear to auscultation without wheeze or crackles, unlabored breathing, good air entry bilaterally HEART: Normal S1,S2 without murmurs EXTREMITIES: Without clubbing, cyanosis, or edema      Assessment & Plan:   URI Likely viral, but will have a low tolerance to start an antibiotic if there is no improvement or any worsening over the next few days given her pulmonary sarcoidosis Cough syrup prescribed Albuterol as needed Call Monday if no improvement  See Problem List for Assessment and Plan of chronic medical problems.

## 2016-01-03 ENCOUNTER — Encounter: Payer: Self-pay | Admitting: Internal Medicine

## 2016-01-03 NOTE — Assessment & Plan Note (Signed)
Wt Readings from Last 3 Encounters:  01/02/16 267 lb (121.11 kg)  09/26/15 261 lb 9.6 oz (118.661 kg)  08/15/15 264 lb 9.6 oz (120.022 kg)   Discussed importance of regular exercise She needs to control her emotional eating Was prescribed saxenda/victoza a few months ago, but never took it -- she does want to try it Can consider other alternatives in future such as belviq, contrave, qysmia Will try victoza - call with side effects which we discussed Monitor sugars closely

## 2016-01-03 NOTE — Assessment & Plan Note (Signed)
BP Readings from Last 3 Encounters:  01/02/16 114/80  09/26/15 116/62  08/15/15 130/84   Controlled Continue current medication

## 2016-01-03 NOTE — Assessment & Plan Note (Signed)
Lab Results  Component Value Date   HGBA1C 5.4 07/09/2015   Has been well controlled, but sugars at home have been higher Stressed regular exercise and controlled portions/eating Will try victoza for sugars and weight loss

## 2016-01-03 NOTE — Addendum Note (Signed)
Addended by: [name] on: 01/03/2016 01:51 PM   Modules accepted: Miquel Dunn

## 2016-01-03 NOTE — Assessment & Plan Note (Signed)
Controlled  Continue daily medications

## 2016-01-12 ENCOUNTER — Ambulatory Visit: Payer: Medicare Other | Admitting: Internal Medicine

## 2016-01-20 ENCOUNTER — Telehealth: Payer: Self-pay | Admitting: Internal Medicine

## 2016-01-20 NOTE — Telephone Encounter (Signed)
Pt called stated that diabetic testing company fax over paper work to get Cynthia Kirk new Victoza glucose meter. They haven't go anything back from our office, advise pt that Dr. Quay Burow is out of the office (she was wondering if someone else can okey this for her, she really need the new meter). Please call her back today if possible.

## 2016-01-21 NOTE — Telephone Encounter (Signed)
Spoke with pt. She is going to call company again with correct fax numbers and insure that they have the correct number.

## 2016-01-22 NOTE — Telephone Encounter (Signed)
Patient needs script sent to current [facility] on [address] in [place] for a Once [employer]

## 2016-01-23 MED ORDER — ONETOUCH ULTRA SYSTEM W/DEVICE KIT: PACK | Status: DC

## 2016-01-23 NOTE — Telephone Encounter (Signed)
sent 

## 2016-01-23 NOTE — Addendum Note (Signed)
Addended by: Terence Lux B on: 01/23/2016 03:21 PM   Modules accepted: Orders

## 2016-01-26 ENCOUNTER — Other Ambulatory Visit: Payer: Self-pay | Admitting: Internal Medicine

## 2016-02-03 ENCOUNTER — Other Ambulatory Visit: Payer: Self-pay | Admitting: Emergency Medicine

## 2016-02-05 ENCOUNTER — Telehealth: Payer: Self-pay | Admitting: Internal Medicine

## 2016-02-05 NOTE — Telephone Encounter (Signed)
Pt called and states the Victoza had no needles and her one touch monitor had no strips.  She states [facility] never contacted [place] about this so she now wants [facility] [address]. In [place]  She had an appointment for next week for a 6 week follow up and we went ahead and changed it to another 5 weeks out since she has not been taking the Victoza.

## 2016-02-06 MED ORDER — GLUCOSE BLOOD VI STRP: ORAL_STRIP | Status: DC

## 2016-02-06 MED ORDER — PEN NEEDLES 32G X 4 MM MISC: 1.0000 | Freq: Three times a day (TID) | Status: DC

## 2016-02-06 NOTE — Telephone Encounter (Signed)
RXes have been sent to pharm.

## 2016-02-06 NOTE — Telephone Encounter (Signed)
LVM for pt to call back to verify what she needs sent in.

## 2016-02-10 ENCOUNTER — Telehealth: Payer: Self-pay | Admitting: *Deleted

## 2016-02-10 ENCOUNTER — Ambulatory Visit: Payer: Medicare Other | Admitting: Pulmonary Disease

## 2016-02-10 MED ORDER — ONETOUCH ULTRA SYSTEM W/DEVICE KIT: PACK | Status: DC

## 2016-02-10 MED ORDER — ONETOUCH ULTRASOFT LANCETS MISC: 1.0000 | Freq: Two times a day (BID) | Status: DC

## 2016-02-10 MED ORDER — GLUCOSE BLOOD VI STRP: 1.0000 | ORAL_STRIP | Freq: Two times a day (BID) | Status: DC

## 2016-02-10 NOTE — Telephone Encounter (Signed)
Pharmacist left msg on triage stating receive script for pt BS testing strips, but need to know frequency, also need lancet, and machine.Marland KitchenResent One touch device w/supplies....Johny Chess

## 2016-02-11 MED ORDER — ONETOUCH ULTRASOFT LANCETS MISC: Status: DC

## 2016-02-11 MED ORDER — GLUCOSE BLOOD VI STRP: ORAL_STRIP | Status: DC

## 2016-02-11 NOTE — Addendum Note (Signed)
Addended by: Johney Frame, Adelina Mings M on: 02/11/2016 03:00 PM   Modules accepted: Orders

## 2016-02-11 NOTE — Telephone Encounter (Addendum)
Pt lm on machine.  erx were not sent in correctly.   Resent erx for test strips and lancets for OneTouch Ultra.

## 2016-02-13 ENCOUNTER — Ambulatory Visit: Payer: Medicare Other | Admitting: Internal Medicine

## 2016-02-14 ENCOUNTER — Telehealth: Payer: Self-pay | Admitting: Internal Medicine

## 2016-02-14 NOTE — Telephone Encounter (Signed)
Patient still is unable to get lancets or strips from [facility]. Please contact. Patient was told by call a nurse that they can't write Rxs and was transferred to Saturday clinic. Advised patient to check with pharmacy & that note would be sent to PCP.

## 2016-02-14 NOTE — Telephone Encounter (Signed)
Please call -- looks like scripts were sent to pharmacy on 3/8

## 2016-02-16 ENCOUNTER — Telehealth: Payer: Self-pay | Admitting: Internal Medicine

## 2016-02-16 MED ORDER — ONETOUCH ULTRASOFT LANCETS MISC: Status: DC

## 2016-02-16 MED ORDER — ONETOUCH ULTRA SYSTEM W/DEVICE KIT: PACK | Status: DC

## 2016-02-16 MED ORDER — GLUCOSE BLOOD VI STRP: ORAL_STRIP | Status: DC

## 2016-02-16 NOTE — Telephone Encounter (Signed)
Spoke to pt, [facility]

## 2016-02-16 NOTE — Telephone Encounter (Signed)
LVM for pt to call back.

## 2016-02-16 NOTE — Telephone Encounter (Signed)
Cynthia Kirk from [facility] called stating they need to talk to you regarding a CMN paperwork for her blood glucose monitor and strips and lancets Her number is [phone].  However, my next phone call was the pt.  She states she talked to medicare and they are upset she hasn't received her monitor yet and that [facility] are putting her through all this. They advised her to have [place] [facility] [address]. Since she has history with them.  If you send them there can you call Cynthia Kirk at [facility] to let her know.

## 2016-02-17 ENCOUNTER — Other Ambulatory Visit: Payer: Self-pay | Admitting: Emergency Medicine

## 2016-02-17 MED ORDER — DEXLANSOPRAZOLE 60 MG PO CPDR: 1.0000 | DELAYED_RELEASE_CAPSULE | Freq: Every day | ORAL | Status: DC

## 2016-02-17 MED ORDER — NYSTATIN 100000 UNIT/GM EX CREA: TOPICAL_CREAM | CUTANEOUS | Status: DC

## 2016-02-17 MED ORDER — MOMETASONE FUROATE 220 MCG/INH IN AEPB: INHALATION_SPRAY | RESPIRATORY_TRACT | Status: DC

## 2016-02-17 MED ORDER — MONTELUKAST SODIUM 10 MG PO TABS: ORAL_TABLET | ORAL | Status: DC

## 2016-02-17 MED ORDER — LIRAGLUTIDE 18 MG/3ML ~~LOC~~ SOPN: PEN_INJECTOR | SUBCUTANEOUS | Status: DC

## 2016-02-23 ENCOUNTER — Other Ambulatory Visit: Payer: Self-pay | Admitting: Emergency Medicine

## 2016-02-23 MED ORDER — HYDROQUINONE 4 % EX CREA: TOPICAL_CREAM | Freq: Two times a day (BID) | CUTANEOUS | Status: DC

## 2016-02-24 ENCOUNTER — Other Ambulatory Visit: Payer: Self-pay | Admitting: Internal Medicine

## 2016-02-25 NOTE — Telephone Encounter (Signed)
Medication sent to pharmacy  

## 2016-02-25 NOTE — Telephone Encounter (Signed)
Please advise ok to refill?  

## 2016-03-01 ENCOUNTER — Emergency Department (HOSPITAL_COMMUNITY)
Admission: EM | Admit: 2016-03-01 | Discharge: 2016-03-01 | Disposition: A | Discharge status: Home / Self Care | Payer: Medicare Other | Attending: Emergency Medicine | Admitting: Emergency Medicine

## 2016-03-01 ENCOUNTER — Emergency Department (HOSPITAL_COMMUNITY): Payer: Medicare Other

## 2016-03-01 ENCOUNTER — Encounter (HOSPITAL_COMMUNITY): Payer: Self-pay | Admitting: Emergency Medicine

## 2016-03-01 ENCOUNTER — Telehealth: Payer: Self-pay | Admitting: Internal Medicine

## 2016-03-01 DIAGNOSIS — Z862 Personal history of diseases of the blood and blood-forming organs and certain disorders involving the immune mechanism: Secondary | ICD-10-CM | POA: Insufficient documentation

## 2016-03-01 DIAGNOSIS — G4733 Obstructive sleep apnea (adult) (pediatric): Secondary | ICD-10-CM | POA: Diagnosis not present

## 2016-03-01 DIAGNOSIS — G8929 Other chronic pain: Secondary | ICD-10-CM | POA: Diagnosis not present

## 2016-03-01 DIAGNOSIS — F410 Panic disorder [episodic paroxysmal anxiety] without agoraphobia: Secondary | ICD-10-CM | POA: Diagnosis not present

## 2016-03-01 DIAGNOSIS — Z86018 Personal history of other benign neoplasm: Secondary | ICD-10-CM | POA: Diagnosis not present

## 2016-03-01 DIAGNOSIS — Z9981 Dependence on supplemental oxygen: Secondary | ICD-10-CM | POA: Diagnosis not present

## 2016-03-01 DIAGNOSIS — Z8701 Personal history of pneumonia (recurrent): Secondary | ICD-10-CM | POA: Diagnosis not present

## 2016-03-01 DIAGNOSIS — Z79899 Other long term (current) drug therapy: Secondary | ICD-10-CM | POA: Insufficient documentation

## 2016-03-01 DIAGNOSIS — M549 Dorsalgia, unspecified: Secondary | ICD-10-CM | POA: Diagnosis not present

## 2016-03-01 DIAGNOSIS — Z8619 Personal history of other infectious and parasitic diseases: Secondary | ICD-10-CM | POA: Insufficient documentation

## 2016-03-01 DIAGNOSIS — R110 Nausea: Secondary | ICD-10-CM | POA: Insufficient documentation

## 2016-03-01 DIAGNOSIS — R509 Fever, unspecified: Secondary | ICD-10-CM | POA: Diagnosis not present

## 2016-03-01 DIAGNOSIS — R079 Chest pain, unspecified: Secondary | ICD-10-CM | POA: Diagnosis not present

## 2016-03-01 DIAGNOSIS — K219 Gastro-esophageal reflux disease without esophagitis: Secondary | ICD-10-CM | POA: Diagnosis not present

## 2016-03-01 DIAGNOSIS — M25511 Pain in right shoulder: Secondary | ICD-10-CM | POA: Diagnosis not present

## 2016-03-01 DIAGNOSIS — E119 Type 2 diabetes mellitus without complications: Secondary | ICD-10-CM | POA: Diagnosis not present

## 2016-03-01 DIAGNOSIS — Z8742 Personal history of other diseases of the female genital tract: Secondary | ICD-10-CM | POA: Diagnosis not present

## 2016-03-01 DIAGNOSIS — J45909 Unspecified asthma, uncomplicated: Secondary | ICD-10-CM | POA: Insufficient documentation

## 2016-03-01 DIAGNOSIS — Z86010 Personal history of colonic polyps: Secondary | ICD-10-CM | POA: Insufficient documentation

## 2016-03-01 DIAGNOSIS — R6883 Chills (without fever): Secondary | ICD-10-CM | POA: Diagnosis not present

## 2016-03-01 DIAGNOSIS — Z794 Long term (current) use of insulin: Secondary | ICD-10-CM | POA: Diagnosis not present

## 2016-03-01 DIAGNOSIS — M542 Cervicalgia: Secondary | ICD-10-CM | POA: Diagnosis not present

## 2016-03-01 DIAGNOSIS — R0789 Other chest pain: Secondary | ICD-10-CM | POA: Diagnosis not present

## 2016-03-01 DIAGNOSIS — Z7951 Long term (current) use of inhaled steroids: Secondary | ICD-10-CM | POA: Diagnosis not present

## 2016-03-01 DIAGNOSIS — Z91040 Latex allergy status: Secondary | ICD-10-CM | POA: Diagnosis not present

## 2016-03-01 DIAGNOSIS — R05 Cough: Secondary | ICD-10-CM | POA: Diagnosis not present

## 2016-03-01 LAB — BASIC METABOLIC PANEL
ANION GAP: 11 (ref 5–15)
BUN: 16 mg/dL (ref 6–20)
CO2: 25 mmol/L (ref 22–32)
Calcium: 9.1 mg/dL (ref 8.9–10.3)
Chloride: 102 mmol/L (ref 101–111)
Creatinine, Ser: 0.94 mg/dL (ref 0.44–1.00)
Glucose, Bld: 76 mg/dL (ref 65–99)
POTASSIUM: 3.8 mmol/L (ref 3.5–5.1)
SODIUM: 138 mmol/L (ref 135–145)

## 2016-03-01 LAB — CBC WITH DIFFERENTIAL/PLATELET
BASOS PCT: 0 %
Basophils Absolute: 0 10*3/uL (ref 0.0–0.1)
EOS ABS: 0.1 10*3/uL (ref 0.0–0.7)
EOS PCT: 2 %
HCT: 36.9 % (ref 36.0–46.0)
Hemoglobin: 11.7 g/dL — ABNORMAL LOW (ref 12.0–15.0)
LYMPHS ABS: 1.7 10*3/uL (ref 0.7–4.0)
Lymphocytes Relative: 34 %
MCH: 28.1 pg (ref 26.0–34.0)
MCHC: 31.7 g/dL (ref 30.0–36.0)
MCV: 88.7 fL (ref 78.0–100.0)
Monocytes Absolute: 0.5 10*3/uL (ref 0.1–1.0)
Monocytes Relative: 9 %
Neutro Abs: 2.8 10*3/uL (ref 1.7–7.7)
Neutrophils Relative %: 55 %
PLATELETS: 222 10*3/uL (ref 150–400)
RBC: 4.16 MIL/uL (ref 3.87–5.11)
RDW: 13.6 % (ref 11.5–15.5)
WBC: 5.1 10*3/uL (ref 4.0–10.5)

## 2016-03-01 LAB — I-STAT TROPONIN, ED: TROPONIN I, POC: 0 ng/mL (ref 0.00–0.08)

## 2016-03-01 MED ORDER — OXYCODONE-ACETAMINOPHEN 5-325 MG PO TABS: 1.0000 | ORAL_TABLET | Freq: Four times a day (QID) | ORAL | Status: DC | PRN

## 2016-03-01 MED ORDER — FENTANYL CITRATE (PF) 100 MCG/2ML IJ SOLN
75.0000 ug | Freq: Once | INTRAMUSCULAR | Status: AC
  Administered 2016-03-01: 75 ug via INTRAMUSCULAR

## 2016-03-01 MED ORDER — PREDNISONE 20 MG PO TABS: 40.0000 mg | ORAL_TABLET | Freq: Every day | ORAL | Status: DC

## 2016-03-01 MED ORDER — HYDROMORPHONE HCL 1 MG/ML IJ SOLN
1.0000 mg | Freq: Once | INTRAMUSCULAR | Status: AC
  Administered 2016-03-01: 1 mg via INTRAVENOUS
  Filled 2016-03-01: qty 1

## 2016-03-01 MED ORDER — MORPHINE SULFATE (PF) 4 MG/ML IV SOLN: 4.0000 mg | Freq: Once | INTRAVENOUS | Status: DC

## 2016-03-01 MED ORDER — HYDROMORPHONE HCL 1 MG/ML IJ SOLN
1.0000 mg | Freq: Once | INTRAMUSCULAR | Status: AC
Start: 2016-03-01 — End: 2016-03-01
  Administered 2016-03-01: 1 mg via INTRAVENOUS
  Filled 2016-03-01: qty 1

## 2016-03-01 MED ORDER — PREDNISONE 20 MG PO TABS
60.0000 mg | ORAL_TABLET | Freq: Once | ORAL | Status: AC
  Administered 2016-03-01: 60 mg via ORAL
  Filled 2016-03-01: qty 3

## 2016-03-01 MED ORDER — FENTANYL CITRATE (PF) 100 MCG/2ML IJ SOLN
50.0000 ug | Freq: Once | INTRAMUSCULAR | Status: DC
  Filled 2016-03-01: qty 2

## 2016-03-01 MED ORDER — MORPHINE SULFATE (PF) 4 MG/ML IV SOLN
4.0000 mg | Freq: Once | INTRAVENOUS | Status: DC
  Filled 2016-03-01: qty 1

## 2016-03-01 NOTE — Discharge Instructions (Signed)
Please call your primary care provider and [facility] to schedule a follow up evaluation for your ongoing shoulder pain. Take pain medication as needed. Your bloodwork and chest x-ray were otherwise unremarkable today. Return to the ER for new or worsening symptoms.

## 2016-03-01 NOTE — ED Provider Notes (Signed)
CSN: [id number]     Arrival date & time 03/01/16  1145 History  By signing my name below, I, Soijett Blue, attest that this documentation has been prepared under the direction and in the presence of Marinell Igarashi Y. Abisola Carrero, PA-C Electronically Signed: Soijett Blue, ED Scribe. 03/01/2016. 1:50 PM.   Chief Complaint  Patient presents with  . Shoulder Pain      The history is provided by the patient. No language interpreter was used.    Cynthia Kirk is a 65 y.o. female with a medical hx of osteoarthritis, sarcoidosis, and DM who presents to the Emergency Department complaining of constant, moderate, right shoulder pain onset 2 months ago. She notes that she has had right shoulder pain since she got her flu shot into the same arm. The pain radiates down her arm and up into her neck at times. Pt called her PCP and spoke with a nurse who informed her to come into the ED for further evaluation due to the pt having "heaviness" to the sternal chest since last night. Pt reports that her right shoulder pain radiates to her neck, wrist, and upper back. She states that her right shoulder pain is worsened with movement and alleviated with rest.   Pt is having associated symptoms of chest pressure 12 hours ago, chills, upper left shoulder pain, and nausea. Pt notes that she was laying in the bed when the chest pressure occurred. States the episode lasted a few minutes and resolved on its own. States it felt like someone was sitting on her chest.. She notes that she has tried advil, tylenol, and dilaudid with no relief of her symptoms. She denies diaphoresis, vomiting, SOB, and any other symptoms. Pt denies PMHx of blood clots.    Past Medical History  Diagnosis Date  . Hiatal hernia   . Morbid obesity ([place]) 2010  . Esophageal reflux   . Allergic rhinitis, cause unspecified   . Unspecified asthma(493.90)   . Sarcoidosis [facility]) 1996    @ [place]  . Iron deficiency anemia   . Syncope     ?? Syncope ??  . Ejection  fraction     EF 45-50%, echo, October 27, 2011  . H/O menorrhagia 2011  . Endometrial mass 2011  . Urinary incontinence, mixed 2011  . Simple endometrial hyperplasia 2011  . Herpes simplex type 2 infection complicating pregnancy   . ASCUS with positive high risk HPV   . VIN II (vulvar intraepithelial neoplasia II)   . Fibroid   . Anxiety   . H/O Clostridium difficile infection 03/2010  . Adenomatous colon polyp   . Blood transfusion 1976    Due to ectopic pregnancy  . Abnormal Pap smear 2008  . Obstructive sleep apnea (adult) (pediatric)     cpap  . Pneumonia 2005; 2007; 2009; 2011  . Type 2 diabetes, diet controlled ([place])   . Spinal stenosis, lumbar     s/p decompression laminectomy 09/2013  . Chronic back pain   . Panic attacks    Past Surgical History  Procedure Laterality Date  . Vulvectomy  ~ 2007    "for cancerous cells" (09/05/2013)  . Ectopic pregnancy surgery  1976; ~ 1978  . Urethral sling  2011  . Dilation and curettage of uterus  2007  . Posterior laminectomy / decompression lumbar spine  09/05/2013  . Thoracic laminectomy  09/05/2013  . Posterior lumbar fusion  09/05/2013  . Hysteroscopy diagnostic  2011  . Colposcopy vulva w/ biopsy      "  I've had maybe 3" (09/05/2013)  . Maximum access (mas)posterior lumbar interbody fusion (plif) 1 level N/A 09/05/2013    Procedure: Lumbar four-five Maximum Access Surgery  Posterior lumbar interbody fusion;  Surgeon: Eustace Moore, MD;  Location: [facility];  Service: Neurosurgery;  Laterality: N/A;  Lumbar four-five Maximum Access Surgery  Posterior lumbar interbody fusion  . Lumbar laminectomy/decompression microdiscectomy N/A 09/05/2013    Procedure: Thoracic eleven-twelve Thoracic Laminectomy;  Surgeon: Eustace Moore, MD;  Location: [facility];  Service: Neurosurgery;  Laterality: N/A;  Thoracic eleven-twelve Thoracic Laminectomy   Family History  Problem Relation Age of Onset  . Heart attack Father   . Hypertension  Father   . Diabetes Father   . Seizures Father   . Migraines Father   . Kidney disease Sister   . Osteoporosis Sister   . Heart attack Sister   . Asthma Daughter   . Asthma Son   . Osteoporosis Sister   . Lung cancer Maternal Uncle   . Cancer Paternal Uncle   . Kidney disease Other   . Diabetes Other   . High Cholesterol Brother     x2   Social History  Substance Use Topics  . Smoking status: Never Smoker   . Smokeless tobacco: Never Used  . Alcohol Use: Yes     Comment: 09/05/2013 "might have a glass of wine maybe q couple months"   OB History    Gravida Para Term Preterm AB TAB SAB Ectopic Multiple Living   8         2     Review of Systems  Constitutional: Positive for chills. Negative for diaphoresis.  Respiratory: Negative for shortness of breath.   Gastrointestinal: Positive for nausea. Negative for vomiting.  Musculoskeletal: Positive for back pain, arthralgias and neck pain.  Skin: Negative for color change, rash and wound.  All other systems reviewed and are negative.   Allergies  Aspirin; Ibuprofen; and Latex  Home Medications   Prior to Admission medications   Medication Sig Start Date End Date Taking? Authorizing Provider  albuterol (PROVENTIL HFA;VENTOLIN HFA) 108 (90 BASE) MCG/ACT inhaler Inhale 2 puffs into the lungs every 6 (six) hours as needed for wheezing or shortness of breath.    Historical Provider, MD  albuterol (PROVENTIL) (2.5 MG/3ML) 0.083% nebulizer solution Take 3 mLs (2.5 mg total) by nebulization every 6 (six) hours as needed for shortness of breath. 08/15/15   Chesley Mires, MD  ALPRAZolam Duanne Moron) 1 MG tablet TAKE 1 TABLET TWICE A DAY AS NEEDED 02/25/16   [name], MD  Blood Glucose Monitoring Suppl (ONE TOUCH ULTRA SYSTEM KIT) w/Device KIT Test blood sugars daily. DX E11.9 02/16/16   [name], MD  BYSTOLIC 5 MG tablet TAKE 1 TABLET BY MOUTH DAILY 10/08/15   Rowe Clack, MD  chlorpheniramine-HYDROcodone [facility] PENNKINETIC  ER) 10-8 MG/5ML SUER Take 5 mLs by mouth every 12 (twelve) hours as needed for cough. 01/02/16   [name], MD  Coenzyme Q10 (CO Q 10) 100 MG CAPS Take 1 capsule by mouth daily.    Historical Provider, MD  dexlansoprazole (DEXILANT) 60 MG capsule Take 1 capsule (60 mg total) by mouth daily. 02/17/16   [name], MD  glucose blood (ONE TOUCH ULTRA TEST) test strip Use to check blood sugars twice a day Dx: E11.9 02/16/16   [name], MD  hydroquinone 4 % cream Apply topically 2 (two) times daily. 02/23/16   Marzetta Board  Lorretta Harp, MD  Insulin Pen Needle (PEN NEEDLES) 32G X 4 MM MISC 1 applicator by Does not apply route 3 (three) times daily. 02/06/16   [name], MD  Lancets (ONETOUCH ULTRASOFT) lancets Use to check blood sugar twice a day. Dx E11.9 02/16/16   [name], MD  Liraglutide (VICTOZA) 18 MG/3ML SOPN Inject 0.6 mg SQ daily x 1 week. then 1.2 mg SQ daily x 1 week then 1.8 mg SQ daily 02/17/16   [name], MD  medroxyPROGESTERone (PROVERA) 10 MG tablet Take 10 mg by mouth See admin instructions. Take '10mg'$  daily for 14 days Every 3 months. 07/27/12   Everett Graff, MD  mometasone (ASMANEX 60 METERED DOSES) 220 MCG/INH inhaler INHALE 2 PUFFS INTO THE LUNGS DAILY. 02/17/16   [name], MD  montelukast (SINGULAIR) 10 MG tablet TAKE 1 TABLET (10 MG TOTAL) BY MOUTH AT BEDTIME. 02/17/16   [name], MD  nystatin cream (MYCOSTATIN) APPLY 1 G TOPICALLY 3 (THREE) TIMES DAILY. 02/17/16   [name], MD  olmesartan-hydrochlorothiazide ([name] HCT) 40-25 MG tablet TAKE ONE TABLET BY MOUTH ONCE DAILY 01/26/16   [name], MD  polyethylene glycol powder (GLYCOLAX/MIRALAX) powder Take 255 g by mouth daily as needed for moderate constipation. 10/20/15   Rowe Clack, MD  PROCTOSOL HC 2.5 % rectal cream APPLY RECTALLY 2 (TWO) TIMES DAILY. 06/27/15   Hoyt Koch, MD  ranitidine (ZANTAC) 300 MG tablet TAKE 1 TABLET (300 MG TOTAL) BY MOUTH AT BEDTIME. 03/26/15   Ladene Artist, MD   tolterodine (DETROL LA) 4 MG 24 hr capsule TAKE 1 CAPSULE(S) EVERY DAY BY ORAL ROUTE FOR 60 DAYS. 06/16/15   Historical Provider, MD   BP 116/74 mmHg  Pulse 84  Temp(Src) 97.8 F (36.6 C) (Oral)  Resp 18  SpO2 100%  LMP 02/14/2014 Physical Exam  Constitutional: She is oriented to person, place, and time. She appears well-developed and well-nourished. No distress.  Appears uncomfortable but NAD.  HENT:  Head: Normocephalic and atraumatic.  Eyes: EOM are normal.  Neck: Neck supple.  Cardiovascular: Normal rate, regular rhythm and normal heart sounds.  Exam reveals no gallop and no friction rub.   No murmur heard. Pulmonary/Chest: Effort normal and breath sounds normal. No respiratory distress. She has no wheezes. She has no rales. She exhibits no tenderness.  Abdominal: Soft. There is no tenderness.  Musculoskeletal: Normal range of motion. She exhibits no edema.       Right shoulder: She exhibits tenderness.  Right shoulder and upper forearm diffusely TTP with spasming. Left shoulder is tender. No midline spinal tenderness. Some limitation in shoulder abduction due to  Pain. Grip strength intact bilaterally.   Neurological: She is alert and oriented to person, place, and time.  Skin: Skin is warm and dry.  Psychiatric: She has a normal mood and affect. Her behavior is normal.  Nursing note and vitals reviewed.   ED Course  Procedures (including critical care time) DIAGNOSTIC STUDIES: Oxygen Saturation is 100% on RA, nl by my interpretation.    COORDINATION OF CARE: 1:50 PM Discussed treatment plan with pt at bedside which includes EKG, CXR, labs, and pt agreed to plan.    Labs Review Labs Reviewed  CBC WITH DIFFERENTIAL/PLATELET - Abnormal; Notable for the following:    Hemoglobin 11.7 (*)    All other components within normal limits  BASIC METABOLIC PANEL  I-STAT TROPOININ, ED    Imaging Review Dg Chest 2  View  03/01/2016  CLINICAL DATA:  Intermittent chills x 2  weeks. Right shoulder pain, left neck pain, and right calf pain. No injuries. No cough or congestion. Low grade fever. No n/v. Chronic post-nasal drip. htn and diab. Non smoker. EXAM: CHEST - 2 VIEW COMPARISON:  08/11/2015 FINDINGS: Coarse bilateral upper lobe interstitial opacities and nodularity as before. No new infiltrate or overt edema. Heart size normal. No pneumothorax. No effusion. Spurring in the mid and lower thoracic spine. IMPRESSION: 1. Chronic bilateral upper lobe nodular interstitial changes without acute or superimposed abnormality. Electronically Signed   By: Lucrezia Europe M.D.   On: 03/01/2016 14:39   I have personally reviewed and evaluated these images and lab results as part of my medical decision-making.   EKG Interpretation   Date/Time:  Monday March 01 2016 14:12:01 EDT Ventricular Rate:  78 PR Interval:  176 QRS Duration: 84 QT Interval:  372 QTC Calculation: 424 R Axis:   73 Text Interpretation:  Normal sinus rhythm Normal ECG agree. no cahnge from  old Confirmed by Johnney Killian, MD, Jeannie Done [phone]) on 03/01/2016 2:17:19 PM      MDM   Final diagnoses:  Shoulder pain, right  Chest pain, unspecified chest pain type    Given chest heaviness last night around 1AM will check cardiac labs. EKG shows NSR and no change from prior. She is chest pain free at this time. Chest is nontender. Regarding pt's prolonged shoulder and arm pain, suspect msk etiology. With her history of arthritis and sarcoidosis, it is very possible that pt has new arthropathy or other nonacute/nonemergent etiology to her pain. Wells score for DVT -2. Will hold off on further imaging at this time.  Labs unremarkable. HEART score 3. Low suspicion for ACS. Doubt chest pain is PE. Pt is without increased WOB, tachypnea, or hypoxia. Pain improved with IV Pain meds. Pt states she called ortho and set up an appointment for this week. In the meantime rx given for supportive meds for shoulder pain. Instructed to f/u  with PCP. ER return precautions given.  I personally performed the services described in this documentation, which was scribed in my presence. The recorded information has been reviewed and is accurate.   Anne Ng, PA-C 03/01/16 1921  Leo Grosser, MD 03/02/16 [phone]

## 2016-03-01 NOTE — ED Notes (Signed)
Pt stable, ambulatory, states understanding of discharge instructions 

## 2016-03-01 NOTE — ED Notes (Signed)
Pt sts right shoulder pain since having flu shot several weeks ago; pt sts pain radiates to neck and back as well; pt sts increased pain with movement

## 2016-03-01 NOTE — Telephone Encounter (Signed)
PLEASE NOTE: All timestamps contained within this report are represented as [place] Standard Time. CONFIDENTIALTY NOTICE: This fax transmission is intended only for the addressee. It contains information that is legally privileged, confidential or otherwise protected from use or disclosure. If you are not the intended recipient, you are strictly prohibited from reviewing, disclosing, copying using or disseminating any of this information or taking any action in reliance on or regarding this information. If you have received this fax in error, please notify us immediately by telephone so that we can arrange for its return to [place]. Phone: [phone], Toll-Free: [phone], Fax: [phone] Page: 1 of 1 Call Id: [id number] [place] Patient Name: Cynthia Kirk DOB: 02/02/1951 Initial Comment Caller states wants appt; having problems with right side; RT shoulder hasn't stopped hurting since rec'd flu shot-on/off for a bit, much worse over past few weeks; pain is constant and radiates to neck and back, down to wrist; Nurse Assessment Nurse: Marcelline Deist, RN, Kermit Balo Date/Time (Eastern Time): 03/01/2016 9:22:39 AM Confirm and document reason for call. If symptomatic, describe symptoms. You must click the next button to save text entered. ---Caller states wants appt. having problems with right side. Right shoulder hasn't stopped hurting since received flu shot 1/27. Was on/off for a bit, but has been much worse over past few weeks. The pain is constant and radiates to neck and right side of upper back, down to wrist. Also has pain of the right leg. Can't move arm up to her face, only at a low counter level. Has not been able to sleep this weekend d/t pain. The shoulder may be bigger than the other arm. Has tried arthritis, Tylenol for the pain, Advil, and Xanax. Has the patient traveled out of the country within the  last 30 days? ---Not Applicable Does the patient have any new or worsening symptoms? ---Yes Will a triage be completed? ---Yes Related visit to physician within the last 2 weeks? ---No Does the PT have any chronic conditions? (i.e. diabetes, asthma, etc.) ---Yes List chronic conditions. ---sarcoidosis, asthma, type 2 diabetes, high Bp, on rx Is this a behavioral health or substance abuse call? ---No Guidelines Guideline Title Affirmed Question Affirmed Notes Arm Pain [1] Age > 40 AND [2] associated chest or jaw pain AND [3] pain lasts > 5 minutes Final Disposition User Go to ED Now Marcelline Deist, RN, Lynda Comments Caller states she had chest tightness that lasted > 5 minutes the other morning. Referrals [facility] Disagree/Comply: Comply

## 2016-03-02 ENCOUNTER — Other Ambulatory Visit: Payer: Self-pay | Admitting: Internal Medicine

## 2016-03-03 DIAGNOSIS — M542 Cervicalgia: Secondary | ICD-10-CM | POA: Diagnosis not present

## 2016-03-03 DIAGNOSIS — M5412 Radiculopathy, cervical region: Secondary | ICD-10-CM | POA: Diagnosis not present

## 2016-03-03 DIAGNOSIS — M25511 Pain in right shoulder: Secondary | ICD-10-CM | POA: Diagnosis not present

## 2016-03-03 DIAGNOSIS — M7541 Impingement syndrome of right shoulder: Secondary | ICD-10-CM | POA: Diagnosis not present

## 2016-03-12 ENCOUNTER — Ambulatory Visit (INDEPENDENT_AMBULATORY_CARE_PROVIDER_SITE_OTHER): Payer: Medicare Other | Admitting: Internal Medicine

## 2016-03-12 ENCOUNTER — Encounter: Payer: Self-pay | Admitting: Internal Medicine

## 2016-03-12 VITALS — BP 128/82 | HR 85 | Temp 97.9°F | Resp 16 | Wt 253.0 lb

## 2016-03-12 DIAGNOSIS — M5010 Cervical disc disorder with radiculopathy, unspecified cervical region: Secondary | ICD-10-CM | POA: Diagnosis not present

## 2016-03-12 MED ORDER — OXYCODONE HCL ER 10 MG PO T12A: 10.0000 mg | EXTENDED_RELEASE_TABLET | Freq: Two times a day (BID) | ORAL | Status: DC

## 2016-03-12 MED ORDER — METHOCARBAMOL 500 MG PO TABS: 500.0000 mg | ORAL_TABLET | Freq: Four times a day (QID) | ORAL | Status: DC | PRN

## 2016-03-12 MED ORDER — DIAZEPAM 5 MG PO TABS: ORAL_TABLET | ORAL | Status: DC

## 2016-03-12 NOTE — Patient Instructions (Addendum)
   Medications reviewed and updated.  Changes include starting robaxin and oxycontin.   Your prescription(s) have been submitted to your pharmacy. Please take as directed and contact our office if you believe you are having problem(s) with the medication(s).  A referral was ordered for neurosurgery and pain management.  Please followup in 2 weeks

## 2016-03-12 NOTE — Progress Notes (Signed)
Pre visit review using our clinic review tool, if applicable. No additional management support is needed unless otherwise documented below in the visit note. 

## 2016-03-12 NOTE — Progress Notes (Signed)
Subjective:    Patient ID: Cynthia Kirk, female    DOB: 11-24-1951, 65 y.o.   MRN: [id number]  HPI She is here for follow up.  She went to the ED 3/27 for shoulder pain, chest pain. She has had right shoulder pain for two months and thinks it started after getting the flu shot in that arm.  The pain radiates down the arm and up into her neck at times.  The shoulder pain is worse with movement. She also reported chest heaviness the night prior to going to the ED.  It occurred when she was at rest.  It lasted a few minutes and resolved spontaneously. An EKG showed no change.  troponins were negative.  CXR was negative for acute disease, but did show some chronic interstitial changes. Other blood work was unremarkable.   She does feel some upper chest pain that is likely from her neck.  Her neck and arm pain is 9-10 in intensity.     She did see her orthopedic and was given percocet and it did not help.  She was also given a steroid injection.  She had cervical and shoulder xrays.  She was told she had DDD, arthritis and bone spurs.  He thought there was a bulging disc and pinched nerves.  He wanted her to go to PT.  She wants to have an MRI before doing PT.  She is having neck pain.  The steroid injection helped the right arm, but she now has pain in the left arm.    She has had intermittent neck pain in the past, but it has become more consistent and painful in the past three months.   Percocet and vicodin have not controlled her pain in the past.  Only dilaudid has helped in the past with pain.    She does drop things from her hands but denies weakness.  Her neck muscles are tight and stiff at times.  She has upper back and shoulder pain.  The pain radiates down the left arm.  She has tingling in the left hand   Medications and allergies reviewed with patient and updated if appropriate.  Patient Active Problem List   Diagnosis Date Noted  . B12 deficiency   . Lumbar spinal stenosis  03/07/2013  . Ejection fraction   . Syncope   . Severe obesity (BMI >= 40) ([place]) 09/14/2010  . Essential hypertension 09/14/2010  . ANEMIA-IRON DEFICIENCY 03/20/2010  . Menopausal and postmenopausal disorder 03/20/2010  . GERD 08/22/2009  . Pulmonary sarcoidosis ([place]) 06/30/2009  . Diabetes mellitus type 2, diet-controlled ([place]) 06/30/2009  . OSA on CPAP 06/30/2009  . Upper airway cough syndrome 06/30/2009  . Mild persistent asthma 06/30/2009    Current Outpatient Prescriptions on File Prior to Visit  Medication Sig Dispense Refill  . albuterol (PROVENTIL HFA;VENTOLIN HFA) 108 (90 BASE) MCG/ACT inhaler Inhale 2 puffs into the lungs every 6 (six) hours as needed for wheezing or shortness of breath.    [name] albuterol (PROVENTIL) (2.5 MG/3ML) 0.083% nebulizer solution Take 3 mLs (2.5 mg total) by nebulization every 6 (six) hours as needed for shortness of breath. 75 mL 5  . ALPRAZolam (XANAX) 1 MG tablet TAKE 1 TABLET TWICE A DAY AS NEEDED 60 tablet 0  . Blood Glucose Monitoring Suppl (ONE TOUCH ULTRA SYSTEM KIT) w/Device KIT Test blood sugars daily. DX E11.9 1 each 0  . BYSTOLIC 5 MG tablet TAKE 1 TABLET BY MOUTH DAILY 30 tablet 5  .  Coenzyme Q10 (CO Q 10) 100 MG CAPS Take 1 capsule by mouth daily.    [name] dexlansoprazole (DEXILANT) 60 MG capsule Take 1 capsule (60 mg total) by mouth daily. 30 capsule 11  . glucose blood (ONE TOUCH ULTRA TEST) test strip Use to check blood sugars twice a day Dx: E11.9 200 each 3  . hydroquinone 4 % cream Apply topically 2 (two) times daily. 28.35 g 0  . Insulin Pen Needle (PEN NEEDLES) 32G X 4 MM MISC 1 applicator by Does not apply route 3 (three) times daily. 90 each 1  . Lancets (ONETOUCH ULTRASOFT) lancets Use to check blood sugar twice a day. Dx E11.9 200 each 3  . Liraglutide (VICTOZA) 18 MG/3ML SOPN Inject 0.6 mg SQ daily x 1 week. then 1.2 mg SQ daily x 1 week then 1.8 mg SQ daily 6 pen 1  . medroxyPROGESTERone (PROVERA) 10 MG tablet Take 10 mg by mouth  See admin instructions. Take 56m daily for 14 days Every 3 months.    . mometasone (ASMANEX 60 METERED DOSES) 220 MCG/INH inhaler INHALE 2 PUFFS INTO THE LUNGS DAILY. 1 Inhaler 6  . montelukast (SINGULAIR) 10 MG tablet TAKE 1 TABLET (10 MG TOTAL) BY MOUTH AT BEDTIME. 30 tablet 5  . nystatin cream (MYCOSTATIN) APPLY 1 G TOPICALLY 3 (THREE) TIMES DAILY. 60 g 0  . olmesartan-hydrochlorothiazide ([name] HCT) 40-25 MG tablet TAKE ONE TABLET BY MOUTH ONCE DAILY 30 tablet 11  . polyethylene glycol powder (GLYCOLAX/MIRALAX) powder Take 255 g by mouth daily as needed for moderate constipation. 527 g 11  . predniSONE (DELTASONE) 20 MG tablet Take 2 tablets (40 mg total) by mouth daily. 4 tablet 0  . PROCTOSOL HC 2.5 % rectal cream APPLY RECTALLY 2 (TWO) TIMES DAILY. 28.35 g 0  . ranitidine (ZANTAC) 300 MG tablet TAKE 1 TABLET (300 MG TOTAL) BY MOUTH AT BEDTIME. 30 tablet 11  . tolterodine (DETROL LA) 4 MG 24 hr capsule TAKE 1 CAPSULE(S) EVERY DAY BY ORAL ROUTE FOR 60 DAYS.  3   No current facility-administered medications on file prior to visit.    Past Medical History  Diagnosis Date  . Hiatal hernia   . Morbid [place] ([place]  . Esophageal reflux   . Allergic rhinitis, cause unspecified   . Unspecified asthma(493.90)   . Sarcoidosis ([facility] 1996    [place] . Iron deficiency anemia   . Syncope     ?? Syncope ??  . Ejection fraction     EF 45-50%, echo, October 27, 2011  . H/O menorrhagia 2011  . Endometrial mass 2011  . Urinary incontinence, mixed 2011  . Simple endometrial hyperplasia 2011  . Herpes simplex type 2 infection complicating pregnancy   . ASCUS with positive high risk HPV   . VIN II (vulvar intraepithelial neoplasia II)   . Fibroid   . Anxiety   . H/O Clostridium difficile infection 03/2010  . Adenomatous colon polyp   . Blood transfusion 1976    Due to ectopic pregnancy  . Abnormal Pap smear 2008  . Obstructive sleep apnea (adult) (pediatric)     cpap  . Pneumonia  2005; 2007; 2009; 2011  . Type 2 diabetes, diet controlled ([place]   . Spinal stenosis, lumbar     s/p decompression laminectomy 09/2013  . Chronic back pain   . Panic attacks     Past Surgical History  Procedure Laterality Date  . Vulvectomy  ~ 2007    "for  cancerous cells" (09/05/2013)  . Ectopic pregnancy surgery  1976; ~ 1978  . Urethral sling  2011  . Dilation and curettage of uterus  2007  . Posterior laminectomy / decompression lumbar spine  09/05/2013  . Thoracic laminectomy  09/05/2013  . Posterior lumbar fusion  09/05/2013  . Hysteroscopy diagnostic  2011  . Colposcopy vulva w/ biopsy      "I've had maybe 3" (09/05/2013)  . Maximum access (mas)posterior lumbar interbody fusion (plif) 1 level N/A 09/05/2013    Procedure: Lumbar four-five Maximum Access Surgery  Posterior lumbar interbody fusion;  Surgeon: Eustace Moore, MD;  Location: [place] [facility];  Service: Neurosurgery;  Laterality: N/A;  Lumbar four-five Maximum Access Surgery  Posterior lumbar interbody fusion  . Lumbar laminectomy/decompression microdiscectomy N/A 09/05/2013    Procedure: Thoracic eleven-twelve Thoracic Laminectomy;  Surgeon: Eustace Moore, MD;  Location: [place] [facility];  Service: Neurosurgery;  Laterality: N/A;  Thoracic eleven-twelve Thoracic Laminectomy    Social History   Social History  . Marital Status: Married    Spouse Name: N/A  . Number of Children: 2  . Years of Education: PHD   Occupational History  . retired   .     Social History Main Topics  . Smoking status: Never Smoker   . Smokeless tobacco: Never Used  . Alcohol Use: Yes     Comment: 09/05/2013 "might have a glass of wine maybe q couple months"  . Drug Use: No  . Sexual Activity: Yes    Birth Control/ Protection: Post-menopausal   Other Topics Concern  . None   Social History Narrative   Pt lives at home with her spouse.   She does not use caffeine.    Family History  Problem Relation Age of Onset  . Heart attack Father     . Hypertension Father   . Diabetes Father   . Seizures Father   . Migraines Father   . Kidney disease Sister   . Osteoporosis Sister   . Heart attack Sister   . Asthma Daughter   . Asthma Son   . Osteoporosis Sister   . Lung cancer Maternal Uncle   . Cancer Paternal Uncle   . Kidney disease Other   . Diabetes Other   . High Cholesterol Brother     x2    Review of Systems  Constitutional: Negative for fever.  Respiratory: Negative for shortness of breath.   Cardiovascular: Positive for chest pain (she feels this is from her neck). Negative for palpitations.  Musculoskeletal: Positive for back pain (upper back tightness and stiffness), neck pain and neck stiffness.  Neurological: Positive for numbness (tingling in left hand) and headaches. Negative for weakness (but does drop things).       Objective:   Filed Vitals:   03/12/16 1411  BP: 128/82  Pulse: 85  Temp: 97.9 F (36.6 C)  Resp: 16   Filed Weights   03/12/16 1411  Weight: 253 lb (114.76 kg)   Body mass index is 43.41 kg/(m^2).   Physical Exam  Constitutional: She appears well-developed and well-nourished. No distress.  Musculoskeletal: She exhibits tenderness (upper back and neck - muscles are tight and tender to palpation). She exhibits no edema.  Slight decreased ROM of neck  Neurological:  No weakness in the hands, normal sensation in arms  Skin: She is not diaphoretic.   CT cervical spine 01/25/15: There is mild to moderate disc space flattening with mild anterior and mild posterior spurring at  C3-C4 level. Mild spinal stenosis due to posterior spurring at C3-C4 level. There is moderate disc space flattening with mild anterior and mild posterior spurring at C4-C5 and C5-C6 level. There is moderate spinal canal stenosis due to posterior spurring and calcification of posterior longitudinal ligament at C5-C6 and C6-C7 level. Minimal disc space flattening with anterior spurring at C4-C5 level. Mild  anterior spurring lower endplate of T1 vertebral body. No prevertebral soft tissue swelling. Cervical airway is patent      Assessment & Plan:   See Problem List for Assessment and Plan of chronic medical problems.  Follow up in 2 weeks for response to pain medication and to review other chronic medical problems -

## 2016-03-13 DIAGNOSIS — M5010 Cervical disc disorder with radiculopathy, unspecified cervical region: Secondary | ICD-10-CM | POA: Insufficient documentation

## 2016-03-13 NOTE — Assessment & Plan Note (Addendum)
Symptoms consistent with cervical radiculopathy CT scan last year showed DDD, arthritis, spinal stenosis Will order an MRI Pain is severe and not controlled - will start oxycontin 10 mg twice daily - will adjust as needed Start robaxin for muscle tightness Refer to her neurosurgeon Refer to pain clinic in case they are needed for chronic pain management or injections Follow up in 2-3 weeks

## 2016-03-15 ENCOUNTER — Other Ambulatory Visit: Payer: Self-pay | Admitting: Internal Medicine

## 2016-03-15 DIAGNOSIS — M5412 Radiculopathy, cervical region: Secondary | ICD-10-CM

## 2016-03-16 ENCOUNTER — Encounter: Payer: Self-pay | Admitting: Internal Medicine

## 2016-03-16 NOTE — Telephone Encounter (Signed)
I did put a referral in for Dr Ronnald Ramp at her appt - not sure if she still needs the actual referral - but she does have an appt.   Forms complete - I think you have them

## 2016-03-26 ENCOUNTER — Encounter: Payer: Self-pay | Admitting: Internal Medicine

## 2016-03-26 ENCOUNTER — Ambulatory Visit
Admission: RE | Admit: 2016-03-26 | Discharge: 2016-03-26 | Disposition: A | Discharge status: Home / Self Care | Payer: Medicare Other | Source: Ambulatory Visit | Attending: Internal Medicine | Admitting: Internal Medicine

## 2016-03-26 DIAGNOSIS — M5412 Radiculopathy, cervical region: Secondary | ICD-10-CM

## 2016-03-26 DIAGNOSIS — M4802 Spinal stenosis, cervical region: Secondary | ICD-10-CM | POA: Diagnosis not present

## 2016-03-30 ENCOUNTER — Ambulatory Visit (INDEPENDENT_AMBULATORY_CARE_PROVIDER_SITE_OTHER): Payer: Medicare Other

## 2016-03-30 ENCOUNTER — Ambulatory Visit (INDEPENDENT_AMBULATORY_CARE_PROVIDER_SITE_OTHER): Payer: Medicare Other | Admitting: Gastroenterology

## 2016-03-30 ENCOUNTER — Encounter: Payer: Self-pay | Admitting: Gastroenterology

## 2016-03-30 VITALS — BP 94/60 | Ht 64.0 in | Wt 253.0 lb

## 2016-03-30 VITALS — BP 90/60 | HR 88 | Ht 63.75 in | Wt 252.5 lb

## 2016-03-30 DIAGNOSIS — N959 Unspecified menopausal and perimenopausal disorder: Secondary | ICD-10-CM | POA: Diagnosis not present

## 2016-03-30 DIAGNOSIS — K219 Gastro-esophageal reflux disease without esophagitis: Secondary | ICD-10-CM | POA: Diagnosis not present

## 2016-03-30 DIAGNOSIS — E119 Type 2 diabetes mellitus without complications: Secondary | ICD-10-CM

## 2016-03-30 DIAGNOSIS — Z0000 Encounter for general adult medical examination without abnormal findings: Secondary | ICD-10-CM | POA: Diagnosis not present

## 2016-03-30 DIAGNOSIS — K5900 Constipation, unspecified: Secondary | ICD-10-CM | POA: Diagnosis not present

## 2016-03-30 MED ORDER — DEXLANSOPRAZOLE 60 MG PO CPDR: 1.0000 | DELAYED_RELEASE_CAPSULE | Freq: Every day | ORAL | Status: DC

## 2016-03-30 MED ORDER — RANITIDINE HCL 300 MG PO TABS: ORAL_TABLET | ORAL | Status: DC

## 2016-03-30 NOTE — Patient Instructions (Addendum)
[name] , Thank you for taking time to come for your Medicare Wellness Visit. I appreciate your ongoing commitment to your health goals. Please review the following plan we discussed and let me know if I can assist you in the future.   Will schedule apt for CPE with Dr. Lawerance Bach after May 13 to repeat blood work and fup on other issues, Hep screen; HIV, A1c etc.   Will schedule Dexa here at [facility]; someone will call for apt or can schedule the same day you see Dr. Lawerance Bach for complete physical   [facility]   Will refer to GYN at [facility] for fup on gyn issues; s/p anterior repair   Will refer ophthalmology here in [facility]  Pneumonia vaccine 23 due when back in the office/ deferred today due to apt at 2:15 in GI  Deaf & Hard of Hearing Division Services for free hearing aid  No reviews  [facility]  [address] [place] [address]  [phone]  Will fup with the [facility] for Advance Directives     These are the goals we discussed: Goals    . Weight < 180 lb (81.647 kg)     On diabetic is helping Belong to the weight watchers; online        This is a list of the screening recommended for you and due dates:  Health Maintenance  Topic Date Due  .  Hepatitis C: One time screening is recommended by Center for Disease Control  (CDC) for  adults born from [birth year] through 1965.   [birth year]  . HIV Screening  02/14/1966  . Pap Smear  11/09/2013  . Complete foot exam   03/05/2015  . Hemoglobin A1C  01/09/2016  . DEXA scan (bone density measurement)  02/15/2016  . Pneumonia vaccines (2 of 2 - PPSV23) 02/15/2016  . Mammogram  04/01/2016  . Flu Shot  07/06/2016  . Eye exam for diabetics  09/18/2016  . Colon Cancer Screening  12/06/2017  . Tetanus Vaccine  07/08/2025  . Shingles Vaccine  Addressed     Bone Densitometry Bone densitometry is an imaging test that uses a special X-ray to measure the amount of calcium and other minerals in your bones (bone  density). This test is also known as a bone mineral density test or dual-energy X-ray absorptiometry (DXA). The test can measure bone density at your hip and your spine. It is similar to having a regular X-ray. You may have this test to:  Diagnose a condition that causes weak or thin bones (osteoporosis).  Predict your risk of a broken bone (fracture).  Determine how well osteoporosis treatment is working. LET [facility] CARE PROVIDER KNOW ABOUT:  Any allergies you have.  All medicines you are taking, including vitamins, herbs, eye drops, creams, and over-the-counter medicines.  Previous problems you or members of your family have had with the use of anesthetics.  Any blood disorders you have.  Previous surgeries you have had.  Medical conditions you have.  Possibility of pregnancy.  Any other medical test you had within the previous 14 days that used contrast material. RISKS AND COMPLICATIONS Generally, this is a safe procedure. However, problems can occur and may include the following:  This test exposes you to a very small amount of radiation.  The risks of radiation exposure may be greater to unborn children. BEFORE THE PROCEDURE  Do not take any calcium supplements for 24 hours before having the test.  You can otherwise eat and drink what you usually do.  Take off all metal jewelry, eyeglasses, dental appliances, and any other metal objects. PROCEDURE  You may lie on an exam table. There will be an X-ray generator below you and an imaging device above you.  Other devices, such as boxes or braces, may be used to position your body properly for the scan.  You will need to lie still while the machine slowly scans your body.  The images will show up on a computer monitor. AFTER THE PROCEDURE You may need more testing at a later time.   This information is not intended to replace advice given to you by your health care provider. Make sure you discuss any questions you  have with your health care provider.   Document Released: 12/14/2004 Document Revised: 12/13/2014 Document Reviewed: 05/02/2014 Elsevier Interactive Patient Education 2016 [place].  Diabetes and Foot Care Diabetes may cause you to have problems because of poor blood supply (circulation) to your feet and legs. This may cause the skin on your feet to become thinner, break easier, and heal more slowly. Your skin may become dry, and the skin may peel and crack. You may also have nerve damage in your legs and feet causing decreased feeling in them. You may not notice minor injuries to your feet that could lead to infections or more serious problems. Taking care of your feet is one of the most important things you can do for yourself.  HOME CARE INSTRUCTIONS  Wear shoes at all times, even in the house. Do not go barefoot. Bare feet are easily injured.  Check your feet daily for blisters, cuts, and redness. If you cannot see the bottom of your feet, use a mirror or ask someone for help.  Wash your feet with warm water (do not use hot water) and mild soap. Then pat your feet and the areas between your toes until they are completely dry. Do not soak your feet as this can dry your skin.  Apply a moisturizing lotion or petroleum jelly (that does not contain alcohol and is unscented) to the skin on your feet and to dry, brittle toenails. Do not apply lotion between your toes.  Trim your toenails straight across. Do not dig under them or around the cuticle. File the edges of your nails with an emery board or nail file.  Do not cut corns or calluses or try to remove them with medicine.  Wear clean socks or stockings every day. Make sure they are not too tight. Do not wear knee-high stockings since they may decrease blood flow to your legs.  Wear shoes that fit properly and have enough cushioning. To break in new shoes, wear them for just a few hours a day. This prevents you from injuring your feet.  Always look in your shoes before you put them on to be sure there are no objects inside.  Do not cross your legs. This may decrease the blood flow to your feet.  If you find a minor scrape, cut, or break in the skin on your feet, keep it and the skin around it clean and dry. These areas may be cleansed with mild soap and water. Do not cleanse the area with peroxide, alcohol, or iodine.  When you remove an adhesive bandage, be sure not to damage the skin around it.  If you have a wound, look at it several times a day to make sure it is healing.  Do not  use heating pads or hot water bottles. They may burn your skin. If you have lost feeling in your feet or legs, you may not know it is happening until it is too late.  Make sure your health care provider performs a complete foot exam at least annually or more often if you have foot problems. Report any cuts, sores, or bruises to your health care provider immediately. SEEK MEDICAL CARE IF:   You have an injury that is not healing.  You have cuts or breaks in the skin.  You have an ingrown nail.  You notice redness on your legs or feet.  You feel burning or tingling in your legs or feet.  You have pain or cramps in your legs and feet.  Your legs or feet are numb.  Your feet always feel cold. SEEK IMMEDIATE MEDICAL CARE IF:   There is increasing redness, swelling, or pain in or around a wound.  There is a red line that goes up your leg.  Pus is coming from a wound.  You develop a fever or as directed by your health care provider.  You notice a bad smell coming from an ulcer or wound.   This information is not intended to replace advice given to you by your health care provider. Make sure you discuss any questions you have with your health care provider.   Document Released: 11/19/2000 Document Revised: 07/25/2013 Document Reviewed: 05/01/2013 Elsevier Interactive Patient Education 2016 [place] in the  Home  Falls can cause injuries. They can happen to people of all ages. There are many things you can do to make your home safe and to help prevent falls.  WHAT CAN I DO ON THE OUTSIDE OF MY HOME?  Regularly fix the edges of walkways and driveways and fix any cracks.  Remove anything that might make you trip as you walk through a door, such as a raised step or threshold.  Trim any bushes or trees on the path to your home.  Use bright outdoor lighting.  Clear any walking paths of anything that might make someone trip, such as rocks or tools.  Regularly check to see if handrails are loose or broken. Make sure that both sides of any steps have handrails.  Any raised decks and porches should have guardrails on the edges.  Have any leaves, snow, or ice cleared regularly.  Use sand or salt on walking paths during winter.  Clean up any spills in your garage right away. This includes oil or grease spills. WHAT CAN I DO IN THE BATHROOM?   Use night lights.  Install grab bars by the toilet and in the tub and shower. Do not use towel bars as grab bars.  Use non-skid mats or decals in the tub or shower.  If you need to sit down in the shower, use a plastic, non-slip stool.  Keep the floor dry. Clean up any water that spills on the floor as soon as it happens.  Remove soap buildup in the tub or shower regularly.  Attach bath mats securely with double-sided non-slip rug tape.  Do not have throw rugs and other things on the floor that can make you trip. WHAT CAN I DO IN THE BEDROOM?  Use night lights.  Make sure that you have a light by your bed that is easy to reach.  Do not use any sheets or blankets that are too big for your bed. They should not hang down onto  the floor.  Have a firm chair that has side arms. You can use this for support while you get dressed.  Do not have throw rugs and other things on the floor that can make you trip. WHAT CAN I DO IN THE KITCHEN?  Clean up  any spills right away.  Avoid walking on wet floors.  Keep items that you use a lot in easy-to-reach places.  If you need to reach something above you, use a strong step stool that has a grab bar.  Keep electrical cords out of the way.  Do not use floor polish or wax that makes floors slippery. If you must use wax, use non-skid floor wax.  Do not have throw rugs and other things on the floor that can make you trip. WHAT CAN I DO WITH MY STAIRS?  Do not leave any items on the stairs.  Make sure that there are handrails on both sides of the stairs and use them. Fix handrails that are broken or loose. Make sure that handrails are as long as the stairways.  Check any carpeting to make sure that it is firmly attached to the stairs. Fix any carpet that is loose or worn.  Avoid having throw rugs at the top or bottom of the stairs. If you do have throw rugs, attach them to the floor with carpet tape.  Make sure that you have a light switch at the top of the stairs and the bottom of the stairs. If you do not have them, ask someone to add them for you. WHAT ELSE CAN I DO TO HELP PREVENT FALLS?  Wear shoes that:  Do not have high heels.  Have rubber bottoms.  Are comfortable and fit you well.  Are closed at the toe. Do not wear sandals.  If you use a stepladder:  Make sure that it is fully opened. Do not climb a closed stepladder.  Make sure that both sides of the stepladder are locked into place.  Ask someone to hold it for you, if possible.  Clearly mark and make sure that you can see:  Any grab bars or handrails.  First and last steps.  Where the edge of each step is.  Use tools that help you move around (mobility aids) if they are needed. These include:  Canes.  Walkers.  Scooters.  Crutches.  Turn on the lights when you go into a dark area. Replace any light bulbs as soon as they burn out.  Set up your furniture so you have a clear path. Avoid moving your  furniture around.  If any of your floors are uneven, fix them.  If there are any pets around you, be aware of where they are.  Review your medicines with your doctor. Some medicines can make you feel dizzy. This can increase your chance of falling. Ask your doctor what other things that you can do to help prevent falls.   This information is not intended to replace advice given to you by your health care provider. Make sure you discuss any questions you have with your health care provider.   Document Released: 09/18/2009 Document Revised: 04/08/2015 Document Reviewed: 12/27/2014 Elsevier Interactive Patient Education 2016 [place] Maintenance, Female Adopting a healthy lifestyle and getting preventive care can go a long way to promote health and wellness. Talk with your health care provider about what schedule of regular examinations is right for you. This is a good chance for you to check in with  your provider about disease prevention and staying healthy. In between checkups, there are plenty of things you can do on your own. Experts have done a lot of research about which lifestyle changes and preventive measures are most likely to keep you healthy. Ask your health care provider for more information. WEIGHT AND DIET  Eat a healthy diet  Be sure to include plenty of vegetables, fruits, low-fat dairy products, and lean protein.  Do not eat a lot of foods high in solid fats, added sugars, or salt.  Get regular exercise. This is one of the most important things you can do for your health.  Most adults should exercise for at least 150 minutes each week. The exercise should increase your heart rate and make you sweat (moderate-intensity exercise).  Most adults should also do strengthening exercises at least twice a week. This is in addition to the moderate-intensity exercise.  Maintain a healthy weight  Body mass index (BMI) is a measurement that can be used to identify  possible weight problems. It estimates body fat based on height and weight. Your health care provider can help determine your BMI and help you achieve or maintain a healthy weight.  For females 41 years of age and older:   A BMI below 18.5 is considered underweight.  A BMI of 18.5 to 24.9 is normal.  A BMI of 25 to 29.9 is considered overweight.  A BMI of 30 and above is considered obese.  Watch levels of cholesterol and blood lipids  You should start having your blood tested for lipids and cholesterol at 65 years of age, then have this test every 5 years.  You may need to have your cholesterol levels checked more often if:  Your lipid or cholesterol levels are high.  You are older than 65 years of age.  You are at high risk for heart disease.  CANCER SCREENING   Lung Cancer  Lung cancer screening is recommended for adults 63-31 years old who are at high risk for lung cancer because of a history of smoking.  A yearly low-dose CT scan of the lungs is recommended for people who:  Currently smoke.  Have quit within the past 15 years.  Have at least a 30-pack-year history of smoking. A pack year is smoking an average of one pack of cigarettes a day for 1 year.  Yearly screening should continue until it has been 15 years since you quit.  Yearly screening should stop if you develop a health problem that would prevent you from having lung cancer treatment.  Breast Cancer  Practice breast self-awareness. This means understanding how your breasts normally appear and feel.  It also means doing regular breast self-exams. Let your health care provider know about any changes, no matter how small.  If you are in your 20s or 30s, you should have a clinical breast exam (CBE) by a health care provider every 1-3 years as part of a regular health exam.  If you are 101 or older, have a CBE every year. Also consider having a breast X-ray (mammogram) every year.  If you have a family  history of breast cancer, talk to your health care provider about genetic screening.  If you are at high risk for breast cancer, talk to your health care provider about having an MRI and a mammogram every year.  Breast cancer gene (BRCA) assessment is recommended for women who have family members with BRCA-related cancers. BRCA-related cancers include:  Breast.  Ovarian.  Tubal.  Peritoneal cancers.  Results of the assessment will determine the need for genetic counseling and BRCA1 and BRCA2 testing. Cervical Cancer Your health care provider may recommend that you be screened regularly for cancer of the pelvic organs (ovaries, uterus, and vagina). This screening involves a pelvic examination, including checking for microscopic changes to the surface of your cervix (Pap test). You may be encouraged to have this screening done every 3 years, beginning at age 18.  For women ages 15-65, health care providers may recommend pelvic exams and Pap testing every 3 years, or they may recommend the Pap and pelvic exam, combined with testing for human papilloma virus (HPV), every 5 years. Some types of HPV increase your risk of cervical cancer. Testing for HPV may also be done on women of any age with unclear Pap test results.  Other health care providers may not recommend any screening for nonpregnant women who are considered low risk for pelvic cancer and who do not have symptoms. Ask your health care provider if a screening pelvic exam is right for you.  If you have had past treatment for cervical cancer or a condition that could lead to cancer, you need Pap tests and screening for cancer for at least 20 years after your treatment. If Pap tests have been discontinued, your risk factors (such as having a new sexual partner) need to be reassessed to determine if screening should resume. Some women have medical problems that increase the chance of getting cervical cancer. In these cases, your health care  provider may recommend more frequent screening and Pap tests. Colorectal Cancer  This type of cancer can be detected and often prevented.  Routine colorectal cancer screening usually begins at 65 years of age and continues through 65 years of age.  Your health care provider may recommend screening at an earlier age if you have risk factors for colon cancer.  Your health care provider may also recommend using home test kits to check for hidden blood in the stool.  A small camera at the end of a tube can be used to examine your colon directly (sigmoidoscopy or colonoscopy). This is done to check for the earliest forms of colorectal cancer.  Routine screening usually begins at age 67.  Direct examination of the colon should be repeated every 5-10 years through 65 years of age. However, you may need to be screened more often if early forms of precancerous polyps or small growths are found. Skin Cancer  Check your skin from head to toe regularly.  Tell your health care provider about any new moles or changes in moles, especially if there is a change in a mole's shape or color.  Also tell your health care provider if you have a mole that is larger than the size of a pencil eraser.  Always use sunscreen. Apply sunscreen liberally and repeatedly throughout the day.  Protect yourself by wearing long sleeves, pants, a wide-brimmed hat, and sunglasses whenever you are outside. HEART DISEASE, DIABETES, AND HIGH BLOOD PRESSURE   High blood pressure causes heart disease and increases the risk of stroke. High blood pressure is more likely to develop in:  People who have blood pressure in the high end of the normal range (130-139/85-89 mm Hg).  People who are overweight or obese.  People who are African American.  If you are 54-40 years of age, have your blood pressure checked every 3-5 years. If you are 9 years of age or older, have your blood  pressure checked every year. You should have your  blood pressure measured twice--once when you are at a hospital or clinic, and once when you are not at a hospital or clinic. Record the average of the two measurements. To check your blood pressure when you are not at a hospital or clinic, you can use:  An automated blood pressure machine at a pharmacy.  A home blood pressure monitor.  If you are between 3 years and 68 years old, ask your health care provider if you should take aspirin to prevent strokes.  Have regular diabetes screenings. This involves taking a blood sample to check your fasting blood sugar level.  If you are at a normal weight and have a low risk for diabetes, have this test once every three years after 65 years of age.  If you are overweight and have a high risk for diabetes, consider being tested at a younger age or more often. PREVENTING INFECTION  Hepatitis B  If you have a higher risk for hepatitis B, you should be screened for this virus. You are considered at high risk for hepatitis B if:  You were born in a country where hepatitis B is common. Ask your health care provider which countries are considered high risk.  Your parents were born in a high-risk country, and you have not been immunized against hepatitis B (hepatitis B vaccine).  You have HIV or AIDS.  You use needles to inject street drugs.  You live with someone who has hepatitis B.  You have had sex with someone who has hepatitis B.  You get hemodialysis treatment.  You take certain medicines for conditions, including cancer, organ transplantation, and autoimmune conditions. Hepatitis C  Blood testing is recommended for:  Everyone born from [birth year] through 1965.  Anyone with known risk factors for hepatitis C. Sexually transmitted infections (STIs)  You should be screened for sexually transmitted infections (STIs) including gonorrhea and chlamydia if:  You are sexually active and are younger than 65 years of age.  You are older than 65  years of age and your health care provider tells you that you are at risk for this type of infection.  Your sexual activity has changed since you were last screened and you are at an increased risk for chlamydia or gonorrhea. Ask your health care provider if you are at risk.  If you do not have HIV, but are at risk, it may be recommended that you take a prescription medicine daily to prevent HIV infection. This is called pre-exposure prophylaxis (PrEP). You are considered at risk if:  You are sexually active and do not regularly use condoms or know the HIV status of your partner(s).  You take drugs by injection.  You are sexually active with a partner who has HIV. Talk with your health care provider about whether you are at high risk of being infected with HIV. If you choose to begin PrEP, you should first be tested for HIV. You should then be tested every 3 months for as long as you are taking PrEP.  PREGNANCY   If you are premenopausal and you may become pregnant, ask your health care provider about preconception counseling.  If you may become pregnant, take 400 to 800 micrograms (mcg) of folic acid every day.  If you want to prevent pregnancy, talk to your health care provider about birth control (contraception). OSTEOPOROSIS AND MENOPAUSE   Osteoporosis is a disease in which the bones lose minerals and strength with  aging. This can result in serious bone fractures. Your risk for osteoporosis can be identified using a bone density scan.  If you are 91 years of age or older, or if you are at risk for osteoporosis and fractures, ask your health care provider if you should be screened.  Ask your health care provider whether you should take a calcium or vitamin D supplement to lower your risk for osteoporosis.  Menopause may have certain physical symptoms and risks.  Hormone replacement therapy may reduce some of these symptoms and risks. Talk to your health care provider about whether  hormone replacement therapy is right for you.  HOME CARE INSTRUCTIONS   Schedule regular health, dental, and eye exams.  Stay current with your immunizations.   Do not use any tobacco products including cigarettes, chewing tobacco, or electronic cigarettes.  If you are pregnant, do not drink alcohol.  If you are breastfeeding, limit how much and how often you drink alcohol.  Limit alcohol intake to no more than 1 drink per day for nonpregnant women. One drink equals 12 ounces of beer, 5 ounces of wine, or 1 ounces of hard liquor.  Do not use street drugs.  Do not share needles.  Ask your health care provider for help if you need support or information about quitting drugs.  Tell your health care provider if you often feel depressed.  Tell your health care provider if you have ever been abused or do not feel safe at home.   This information is not intended to replace advice given to you by your health care provider. Make sure you discuss any questions you have with your health care provider.   Document Released: 06/07/2011 Document Revised: 12/13/2014 Document Reviewed: 10/24/2013 Elsevier Interactive Patient Education [employer].

## 2016-03-30 NOTE — Progress Notes (Addendum)
d  Subjective:   Cynthia Kirk is a 65 y.o. female who presents for Medicare Annual (Subsequent) preventive examination.  Review of Systems:   HRA assessment completed during visit; Dugan_Deborah  The Patient was informed that this wellness visit is to identify risk and educate on how to reduce risk for increase disease through lifestyle changes.   ROS deferred to CPE exam with physician; planned for May but will be seeing Dr. Quay Burow for fup next week.   Family and medical hx given below; Maternal grandfather had Heart disease   Current issues;  Had flu shot in January;  site was a little red, stinging and then hurting and then  went up into her shoulder; Interfering with sleep; In March went to ER; Later seen by Dr. Quay Burow who ordered an MRI and is in process of evaluating; C/o of some pain or lack of function in distal fingers; hx of 2 back surgeries.   States she feels great otherwise and is doing well;   Lifestyle review:  HTN medically controlled  Sarcoidosis; under control now;  Obesity; losing weight since starting victoza; 10 lbs.  DM2  07/2015 5.4 (lipids 5/16; chol 174; Trig 95; HDL 49; LDL 105)  BS have been running 122; or under   Tobacco: Never smoked  ETOH: occasional glass of wine  Medication review/ Adherent  BMI: 43.4 Diet;  Eats lots of vegetables. Salad and protein Dressing; lemon, vinegar and oil Any fried food; occasionally; baked wings;  Snacks; not very often;   Exercise;  Does some exercise in the home. Will hold off on returning to gym due to evaluation of cervical issues.  Oct 1st 2015 had back surgery; lower disc; had bone spurs removed Likes to walk; walks around the house;    [place]; one level home  lives at home with spouse bathrooms accessible; has a chair she can use in one shower Falls: no  States home is "senior friendly"  Age in place? Staying for now;  Looking at states to retire in so may move.  Removal of clutter clearing  paths through the home,  Has walker at home  Community safety; YES;  Smoke detectors yes  Firearms safety reviewed and will keep in a safe place if these exist.  Driving accidents; she does not drive currently;  wears sun protection   Stressors (1-5) not stressed;   Depression: Denies feeling depressed or hopeless; voices pleasure in daily life Mood stable;   Cognitive; Presents with no issues;  Engaged in [place], medications; no failures of task Ad8 score reviewed for issues; . Issues making decisions: no . Less interest in hobbies / activities: no . Repeats questions, stories; family complaining: no . Trouble using ordinary gadgets; microwave; computer:no . Forgets the month or year:no . Mismanaging finances:no . Missing apt: no . Daily problems with thinking of memory: no  Wants to memorize phone numbers and exercise mind   Fall assessment no Gait assessment appears normal;   Mobilization and Functional losses from last year to this year? Moves slow; does all house hold chores; laundry; am care; considers energy conservation;  Plans to exercise more when cervical issues are resolved   Sleep pattern changes; cpap sleeps well  Hours of sleep on average? 7 to 8; 12 to 13  Urinary or fecal incontinence reviewed; up at hs;   Counseling Health Maintenance Hep c educated  HIV: to discuss with Dr. Quay Burow; thinks she had one Colonoscopy; Jan 2019  EKG:  03/2016  [facility]ogram [facility] at the [facility]; last year in 03/2015; will schedule another mammogram  Dexa at Dr. Unk Lightning who was her GYN x 3 years; lost some bone in hip and calcium was low; Agrees to schedule dexa at [facility]; declines to have a the [facility]  PAP due; needs new gyn doctor; will plan to go in [facility] May need referral for GYN in [place] / still having cycles; was doing colposcopy [facility];   Hearing: thinks she has hearing issue in left ear; Given resources and has had  hearing screen    Ophthalmology exam; 09/2015; ok;  Dx with glaucoma in the past;  Wants an eye referral; (diabetic)   Immunizations Due: PSV 23 deferred due to apt in GI at 2:15; had to check in prior to 2:30.   Advanced Directive; will go to the [place] with spouse and complete  Health Recommendations and Referrals Referrals started for gyn at [facility] in [place] ophthalmologist in [place];  Dexa ordered at [facility]; last exam 2013 and "little bone density loss"  Given resources for assistance with hearing aid   Current Care Team reviewed and updated  Cardiac Risk Factors include: advanced age (>38mn, >>28women);diabetes mellitus;dyslipidemia     Objective:     Vitals: BP 94/60 mmHg  Ht _0  (1.626 m)  Wt 253 lb (114.76 kg)  BMI 43.41 kg/m2  LMP 02/14/2014  Body mass index is 43.41 kg/(m^2).   Tobacco History  Smoking status  . Never Smoker   Smokeless tobacco  . Never Used     Counseling given: Not Answered   Past Medical History  Diagnosis Date  . Hiatal hernia   . Morbid obesity ([place] 2010  . Esophageal reflux   . Allergic rhinitis, cause unspecified   . Unspecified asthma(493.90)   . Sarcoidosis ([facility] 1996    [place] . Iron deficiency anemia   . Syncope     ?? Syncope ??  . Ejection fraction     EF 45-50%, echo, October 27, 2011  . H/O menorrhagia 2011  . Endometrial mass 2011  . Urinary incontinence, mixed 2011  . Simple endometrial hyperplasia 2011  . Herpes simplex type 2 infection complicating pregnancy   . ASCUS with positive high risk HPV   . VIN II (vulvar intraepithelial neoplasia II)   . Fibroid   . Anxiety   . H/O Clostridium difficile infection 03/2010  . Adenomatous colon polyp   . Blood transfusion 1976    Due to ectopic pregnancy  . Abnormal Pap smear 2008  . Obstructive sleep apnea (adult) (pediatric)     cpap  . Pneumonia 2005; 2007; 2009; 2011  . Type 2 diabetes, diet controlled ([place]   . Spinal stenosis, lumbar     s/p  decompression laminectomy 09/2013  . Chronic back pain   . Panic attacks    Past Surgical History  Procedure Laterality Date  . Vulvectomy  ~ 2007    "for cancerous cells" (09/05/2013)  . Ectopic pregnancy surgery  1976; ~ 1978  . Urethral sling  2011  . Dilation and curettage of uterus  2007  . Posterior laminectomy / decompression lumbar spine  09/05/2013  . Thoracic laminectomy  09/05/2013  . Posterior lumbar fusion  09/05/2013  . Hysteroscopy diagnostic  2011  . Colposcopy vulva w/ biopsy      "I've had maybe 3" (09/05/2013)  . Maximum access (mas)posterior lumbar interbody fusion (plif) 1 level N/A 09/05/2013  Procedure: Lumbar four-five Maximum Access Surgery  Posterior lumbar interbody fusion;  Surgeon: Eustace Moore, MD;  Location: [facility];  Service: Neurosurgery;  Laterality: N/A;  Lumbar four-five Maximum Access Surgery  Posterior lumbar interbody fusion  . Lumbar laminectomy/decompression microdiscectomy N/A 09/05/2013    Procedure: Thoracic eleven-twelve Thoracic Laminectomy;  Surgeon: Eustace Moore, MD;  Location: [facility];  Service: Neurosurgery;  Laterality: N/A;  Thoracic eleven-twelve Thoracic Laminectomy   Family History  Problem Relation Age of Onset  . Heart attack Father   . Hypertension Father   . Diabetes Father   . Seizures Father   . Migraines Father   . Kidney disease Sister   . Osteoporosis Sister   . Heart attack Sister   . Asthma Daughter   . Asthma Son   . Osteoporosis Sister   . Lung cancer Maternal Uncle   . Cancer Paternal Uncle   . Kidney disease Other   . Diabetes Other   . High Cholesterol Brother     x2   History  Sexual Activity  . Sexual Activity: Yes  . Birth Control/ Protection: Post-menopausal    Outpatient Encounter Prescriptions as of 03/30/2016  Medication Sig  . albuterol (PROVENTIL HFA;VENTOLIN HFA) 108 (90 BASE) MCG/ACT inhaler Inhale 2 puffs into the lungs every 6 (six) hours as needed for wheezing or shortness of  breath.  [name] albuterol (PROVENTIL) (2.5 MG/3ML) 0.083% nebulizer solution Take 3 mLs (2.5 mg total) by nebulization every 6 (six) hours as needed for shortness of breath.  . ALPRAZolam (XANAX) 1 MG tablet TAKE 1 TABLET TWICE A DAY AS NEEDED  . Blood Glucose Monitoring Suppl (ONE TOUCH ULTRA SYSTEM KIT) w/Device KIT Test blood sugars daily. DX D14.9  . BYSTOLIC 5 MG tablet TAKE 1 TABLET BY MOUTH DAILY  . Coenzyme Q10 (CO Q 10) 100 MG CAPS Take 1 capsule by mouth daily.  [name] glucose blood (ONE TOUCH ULTRA TEST) test strip Use to check blood sugars twice a day Dx: E11.9  . hydroquinone 4 % cream Apply topically 2 (two) times daily.  . Insulin Pen Needle (PEN NEEDLES) 32G X 4 MM MISC 1 applicator by Does not apply route 3 (three) times daily.  . Lancets (ONETOUCH ULTRASOFT) lancets Use to check blood sugar twice a day. Dx E11.9  . Liraglutide (VICTOZA) 18 MG/3ML SOPN Inject 0.6 mg SQ daily x 1 week. then 1.2 mg SQ daily x 1 week then 1.8 mg SQ daily  . medroxyPROGESTERone (PROVERA) 10 MG tablet Take 10 mg by mouth See admin instructions. Take 26m daily for 14 days Every 3 months.  . methocarbamol (ROBAXIN) 500 MG tablet Take 1 tablet (500 mg total) by mouth every 6 (six) hours as needed for muscle spasms.  . mometasone (ASMANEX 60 METERED DOSES) 220 MCG/INH inhaler INHALE 2 PUFFS INTO THE LUNGS DAILY.  . montelukast (SINGULAIR) 10 MG tablet TAKE 1 TABLET (10 MG TOTAL) BY MOUTH AT BEDTIME.  .Marland Kitchennystatin cream (MYCOSTATIN) APPLY 1 G TOPICALLY 3 (THREE) TIMES DAILY.  .Marland Kitchenolmesartan-hydrochlorothiazide ([name] HCT) 40-25 MG tablet TAKE ONE TABLET BY MOUTH ONCE DAILY  . oxyCODONE (OXYCONTIN) 10 mg 12 hr tablet Take 1 tablet (10 mg total) by mouth every 12 (twelve) hours.  . polyethylene glycol powder (GLYCOLAX/MIRALAX) powder Take 255 g by mouth daily as needed for moderate constipation.  .Marland KitchenPROCTOSOL HC 2.5 % rectal cream APPLY RECTALLY 2 (TWO) TIMES DAILY.  .Marland Kitchentolterodine (DETROL LA) 4 MG 24 hr capsule TAKE 1  CAPSULE(S) EVERY DAY BY ORAL ROUTE FOR 60 DAYS.  . [DISCONTINUED] dexlansoprazole (DEXILANT) 60 MG capsule Take 1 capsule (60 mg total) by mouth daily.  . [DISCONTINUED] ranitidine (ZANTAC) 300 MG tablet TAKE 1 TABLET (300 MG TOTAL) BY MOUTH AT BEDTIME.  . [DISCONTINUED] diazepam (VALIUM) 5 MG tablet 1 tab one hour prior to MRI, may repeat x 1 if needed (Patient not taking: Reported on 03/30/2016)   No facility-administered encounter medications on file as of 03/30/2016.    Activities of Daily Living In your present state of health, do you have any difficulty performing the following activities: 03/30/2016  Hearing? N  Vision? N  Difficulty concentrating or making decisions? N  Walking or climbing stairs? Y  Dressing or bathing? N  Doing errands, shopping? N  Preparing Food and eating ? N  Using the Toilet? N  In the past six months, have you accidently leaked urine? Y  Do you have problems with loss of bowel control? N  Managing your Medications? N  Managing your Finances? N  Housekeeping or managing your Housekeeping? N    Patient Care Team: [name], MD as PCP - General (Internal Medicine) Suella Broad, MD as Consulting Physician (Physical Medicine and Rehabilitation) Chesley Mires, MD as Consulting Physician (Pulmonary Disease) Ladene Artist, MD as Consulting Physician (Gastroenterology) Melida Quitter, MD as Consulting Physician (Otolaryngology) Everett Graff, MD as Consulting Physician (Obstetrics and Gynecology) Eustace Moore, MD (Neurosurgery)    Assessment:     Exercise Activities and Dietary recommendations Current Exercise Habits: Home exercise routine (wants to go back to the gym when back issues resolved)Goals; Wt loss 180lb is goal Is currently using weight watchers online  Goals    . Weight < 180 lb (81.647 kg)     On diabetic is helping Belong to the weight watchers; online       Fall Risk Fall Risk  03/30/2016 03/04/2014 01/29/2013  Falls in the past  year? No No No   Depression Screen PHQ 2/9 Scores 03/30/2016 03/04/2014 01/29/2013  PHQ - 2 Score 0 0 0     Cognitive Testing No flowsheet data found.  Ad8 score 0; alert, oriented and engaged at today's assessment   Immunization History  Administered Date(s) Administered  . Influenza Split 10/06/2011  . Influenza Whole 10/14/2009, 09/14/2010  . Influenza, Seasonal, Injecte, Preservative Fre 01/29/2013  . Influenza,inj,Quad PF,36+ Mos 08/16/2013, 09/09/2014, 01/02/2016  . Pneumococcal Conjugate-13 11/15/2014  . Pneumococcal Polysaccharide-23 03/06/2008  . Td 12/06/2004  . Tdap 07/09/2015   Screening Tests Health Maintenance  Topic Date Due  . Hepatitis C Screening  1951/09/08  . HIV Screening  02/14/1966  . PAP SMEAR  11/09/2013  . FOOT EXAM  03/05/2015  . HEMOGLOBIN A1C  01/09/2016  . DEXA SCAN  02/15/2016  . PNA vac Low Risk Adult (2 of 2 - PPSV23) 02/15/2016  . MAMMOGRAM  04/01/2016  . INFLUENZA VACCINE  07/06/2016  . OPHTHALMOLOGY EXAM  09/18/2016  . COLONOSCOPY  12/06/2017  . TETANUS/TDAP  07/08/2025  . ZOSTAVAX  Addressed      Plan:  Will schedule apt for CPE with Dr. Quay Burow after May 13 to repeat blood work and fup on other issues, Hep screen; HIV, A1c etc.   Will schedule [facility]; someone will call for apt or can schedule the same day you see Dr. Quay Burow for complete physical   Will schedule breast exam at the [place]   Will refer to GYN at [facility]  for fup on gyn issues; s/p anterior repair   Will refer ophthalmology here in [facility]  Pneumonia vaccine 23 due when back in the office/ deferred today due to apt at 2:15 in [place] [facility] [place] for free hearing aid  No reviews  [place]  [place] [phone]  [phone]  Will fup with the [place] for Advance Directives    During the course of the visit the patient was educated and counseled about the following appropriate screening and preventive services:    Vaccines to include Pneumoccal, Influenza, Hepatitis B, Td, Zostavax, HCV  PSV 23 due but had to leave for apt. With GI  Electrocardiogram: 03/2016  Cardiovascular Disease/ states she will fup with cardiology this year   Colorectal cancer screening/ 12/2017  Bone density screening/ to be scheduled here at [facility] per her request  Diabetes screening/ tp fup with Dr. Quay Burow  Glaucoma screening; request referral for eye exam in [place]  Mammography to fup at [facility]  PAP; request GYN referral   Nutrition counseling / discussed weight loss and in process.   Reviews Weight Watchers online  Patient Instructions (the written plan) was given to the patient.   Wynetta Fines, RN  03/30/2016    Medical screening examination/treatment/procedure(s) were performed by non-physician practitioner and as supervising physician I was immediately available for consultation/collaboration. I agree with above. [name], MD

## 2016-03-30 NOTE — Progress Notes (Signed)
    History of Present Illness: This is a 65 year old female returning for follow-up of GERD and constipation. She is accompanied by her husband. Her reflux and constipation is well controlled on her current regimen. She states she has mild breakthrough reflux symptoms about once or twice per month.  Current Medications, Allergies, Past Medical History, Past Surgical History, Family History and Social History were reviewed in [employer] record.  Physical Exam: General: Well developed, well nourished, obese, no acute distress Head: Normocephalic and atraumatic Eyes:  sclerae anicteric, EOMI Ears: Normal auditory acuity Mouth: No deformity or lesions Lungs: Clear throughout to auscultation Heart: Regular rate and rhythm; no murmurs, rubs or bruits Abdomen: Soft, non tender and non distended. No masses, hepatosplenomegaly or hernias noted. Normal Bowel sounds Musculoskeletal: Symmetrical with no gross deformities  Pulses:  Normal pulses noted Extremities: No clubbing, cyanosis, edema or deformities noted Neurological: Alert oriented x 4, grossly nonfocal Psychological:  Alert and cooperative. Normal mood and affect  Assessment and Recommendations:  1. GERD. Continue dexlansoprazole 60 mg qam and ranitidine 300 mg hs. Continue all standard antireflux measures. REV in 1 year.   2. Constipation. Continue MiraLAX once or twice daily titrated for adequate bowel movements

## 2016-03-30 NOTE — Patient Instructions (Signed)
We have sent the following prescriptions to your mail in pharmacy:Dexilant and Zatnac.   If you have not heard from your mail in pharmacy within 1 week or if you have not received your medication in the mail, please contact us at [phone] so we may find out why.  Thank you for choosing me and [facility].  Pricilla Riffle. Dagoberto Ligas., MD., Marval Regal

## 2016-04-02 ENCOUNTER — Ambulatory Visit: Payer: Medicare Other | Admitting: Internal Medicine

## 2016-04-05 DIAGNOSIS — M542 Cervicalgia: Secondary | ICD-10-CM | POA: Diagnosis not present

## 2016-04-12 ENCOUNTER — Telehealth: Payer: Self-pay | Admitting: Obstetrics and Gynecology

## 2016-04-12 NOTE — Telephone Encounter (Signed)
Called and left a message for patient to call back to schedule a new patient doctor referral. °

## 2016-04-15 ENCOUNTER — Telehealth: Payer: Self-pay | Admitting: Emergency Medicine

## 2016-04-15 MED ORDER — FLUCONAZOLE 150 MG PO TABS: 150.0000 mg | ORAL_TABLET | Freq: Once | ORAL | Status: DC

## 2016-04-15 NOTE — Telephone Encounter (Signed)
LVm informing pt RX was sent to [facility]

## 2016-04-15 NOTE — Telephone Encounter (Signed)
Sent!

## 2016-04-15 NOTE — Telephone Encounter (Signed)
Pt called in stating that she currently has a yeast infection and will need a prescription for Diflucan. Please advise. Please send to [facility]

## 2016-04-19 DIAGNOSIS — M5030 Other cervical disc degeneration, unspecified cervical region: Secondary | ICD-10-CM | POA: Diagnosis not present

## 2016-04-19 DIAGNOSIS — M5412 Radiculopathy, cervical region: Secondary | ICD-10-CM | POA: Diagnosis not present

## 2016-04-19 DIAGNOSIS — M47812 Spondylosis without myelopathy or radiculopathy, cervical region: Secondary | ICD-10-CM | POA: Diagnosis not present

## 2016-04-20 ENCOUNTER — Other Ambulatory Visit: Payer: Self-pay | Admitting: Internal Medicine

## 2016-04-20 NOTE — Telephone Encounter (Signed)
Called and left a message for patient to call back to schedule a new patient doctor referral. °

## 2016-04-22 MED ORDER — CLOTRIMAZOLE-BETAMETHASONE 1-0.05 % EX CREA: TOPICAL_CREAM | CUTANEOUS | Status: DC

## 2016-04-22 MED ORDER — FLUCONAZOLE 150 MG PO TABS: 150.0000 mg | ORAL_TABLET | Freq: Once | ORAL | Status: DC

## 2016-04-22 NOTE — Addendum Note (Signed)
Addended by: [name] on: 04/22/2016 02:53 PM   Modules accepted: Orders

## 2016-04-23 NOTE — Telephone Encounter (Signed)
Patient declined to schedule an appointment with our office. Referral routed back to referring office.

## 2016-04-28 ENCOUNTER — Other Ambulatory Visit: Payer: Self-pay | Admitting: Internal Medicine

## 2016-04-28 NOTE — Telephone Encounter (Signed)
Faxed script back to [facility].../lmb 

## 2016-05-11 DIAGNOSIS — M5412 Radiculopathy, cervical region: Secondary | ICD-10-CM | POA: Diagnosis not present

## 2016-05-14 ENCOUNTER — Other Ambulatory Visit: Payer: Medicare Other

## 2016-05-14 ENCOUNTER — Encounter: Payer: Medicare Other | Admitting: Internal Medicine

## 2016-05-17 ENCOUNTER — Other Ambulatory Visit: Payer: Self-pay | Admitting: Internal Medicine

## 2016-05-17 ENCOUNTER — Ambulatory Visit
Admission: RE | Admit: 2016-05-17 | Discharge: 2016-05-17 | Disposition: A | Discharge status: Home / Self Care | Payer: Medicare Other | Source: Ambulatory Visit | Attending: Internal Medicine | Admitting: Internal Medicine

## 2016-05-17 DIAGNOSIS — N959 Unspecified menopausal and perimenopausal disorder: Secondary | ICD-10-CM

## 2016-05-18 ENCOUNTER — Ambulatory Visit (INDEPENDENT_AMBULATORY_CARE_PROVIDER_SITE_OTHER): Payer: Medicare Other | Admitting: Pulmonary Disease

## 2016-05-18 ENCOUNTER — Encounter: Payer: Self-pay | Admitting: Pulmonary Disease

## 2016-05-18 VITALS — BP 106/70 | HR 78 | Ht 64.25 in | Wt 247.0 lb

## 2016-05-18 DIAGNOSIS — G4733 Obstructive sleep apnea (adult) (pediatric): Secondary | ICD-10-CM

## 2016-05-18 DIAGNOSIS — R058 Other specified cough: Secondary | ICD-10-CM

## 2016-05-18 DIAGNOSIS — R05 Cough: Secondary | ICD-10-CM | POA: Diagnosis not present

## 2016-05-18 DIAGNOSIS — IMO0002 Reserved for concepts with insufficient information to code with codable children: Secondary | ICD-10-CM

## 2016-05-18 DIAGNOSIS — J45998 Other asthma: Secondary | ICD-10-CM | POA: Diagnosis not present

## 2016-05-18 DIAGNOSIS — Z9989 Dependence on other enabling machines and devices: Principal | ICD-10-CM

## 2016-05-18 DIAGNOSIS — D860 Sarcoidosis of lung: Secondary | ICD-10-CM

## 2016-05-18 MED ORDER — TRIAMCINOLONE ACETONIDE 55 MCG/ACT NA AERO: 2.0000 | INHALATION_SPRAY | Freq: Every day | NASAL | Status: DC

## 2016-05-18 NOTE — Progress Notes (Signed)
Current Outpatient Prescriptions on File Prior to Visit  Medication Sig  . albuterol (PROVENTIL HFA;VENTOLIN HFA) 108 (90 BASE) MCG/ACT inhaler Inhale 2 puffs into the lungs every 6 (six) hours as needed for wheezing or shortness of breath.  [name] albuterol (PROVENTIL) (2.5 MG/3ML) 0.083% nebulizer solution Take 3 mLs (2.5 mg total) by nebulization every 6 (six) hours as needed for shortness of breath.  . ALPRAZolam (XANAX) 1 MG tablet TAKE ONE TABLET BY MOUTH TWICE DAILY AS NEEDED FOR ANXIETY  . Blood Glucose Monitoring Suppl (ONE TOUCH ULTRA SYSTEM KIT) w/Device KIT Test blood sugars daily. DX L93.5  . BYSTOLIC 5 MG tablet TAKE 1 TABLET BY MOUTH EVERY DAY  . dexlansoprazole (DEXILANT) 60 MG capsule Take 1 capsule (60 mg total) by mouth daily.  [name] glucose blood (ONE TOUCH ULTRA TEST) test strip Use to check blood sugars twice a day Dx: E11.9  . hydroquinone 4 % cream Apply topically 2 (two) times daily.  . Insulin Pen Needle (PEN NEEDLES) 32G X 4 MM MISC 1 applicator by Does not apply route 3 (three) times daily.  . Lancets (ONETOUCH ULTRASOFT) lancets Use to check blood sugar twice a day. Dx E11.9  . Liraglutide (VICTOZA) 18 MG/3ML SOPN Inject 0.6 mg SQ daily x 1 week. then 1.2 mg SQ daily x 1 week then 1.8 mg SQ daily  . medroxyPROGESTERone (PROVERA) 10 MG tablet Take 10 mg by mouth See admin instructions. Take '10mg'$  daily for 14 days Every 3 months.  . methocarbamol (ROBAXIN) 500 MG tablet Take 1 tablet (500 mg total) by mouth every 6 (six) hours as needed for muscle spasms.  . mometasone (ASMANEX 60 METERED DOSES) 220 MCG/INH inhaler INHALE 2 PUFFS INTO THE LUNGS DAILY.  . montelukast (SINGULAIR) 10 MG tablet TAKE 1 TABLET (10 MG TOTAL) BY MOUTH AT BEDTIME.  [name] nystatin cream (MYCOSTATIN) APPLY 1 G TOPICALLY 3 (THREE) TIMES DAILY.  [name] olmesartan-hydrochlorothiazide ([name] HCT) 40-25 MG tablet TAKE ONE TABLET BY MOUTH ONCE DAILY  . oxyCODONE (OXYCONTIN) 10 mg 12 hr tablet Take 1 tablet (10 mg total)  by mouth every 12 (twelve) hours.  . polyethylene glycol powder (GLYCOLAX/MIRALAX) powder Take 255 g by mouth daily as needed for moderate constipation.  [name] PROCTOSOL HC 2.5 % rectal cream APPLY RECTALLY 2 (TWO) TIMES DAILY.  . ranitidine (ZANTAC) 300 MG tablet TAKE 1 TABLET (300 MG TOTAL) BY MOUTH AT BEDTIME.   No current facility-administered medications on file prior to visit.    Chief Complaint  Patient presents with  . Follow-up    Denies any current breathing issues. Wears CPAP nightly. Pt states that she was off of it for a few weeks d/t sinus issues - CPAP was making nose runn and causing throat congestion. DME: [facility]; Discuss switching DME    Tests Spirometry 06/30/09 >> FEV1 1.69(87%), FVC 2.17(83%), FEV1% 78 PSG 03/06/14 >> AHI 1.9, REM AHI 26.7, SaO2 low 80%. PSG 05/28/14 >> AHI 5.8, SaO2 low 85%. REM AHI 29.3 CT chest 07/17/14 >> upper lobe predominant BTX, GGO, nodularity no change since 2011 PFT 11/15/14 >> FEV1 1.76 (87%), FEV1% 77, TLC 3.19 (63%), DLCO 74%, no BD CPAP 07/15/15 to 08/13/15 >> used on 30 of 30 nights with average 9 hrs and 21 min.  Average AHI is 0.5 with CPAP 10 cm H2O.  Past medical history GERD, hiatal hernia, Anemia, Anxiety, Chronic back pain  Past surgical history, Social history, Family history, Allergies reviewed.  Vital signs BP 106/70 mmHg  Pulse 78  Ht 5' 4.25" (1.632 m)  Wt 247 lb (112.038 kg)  BMI 42.07 kg/m2  SpO2 100%  LMP 02/14/2014   History of Present Illness: Cynthia Kirk is a 65 y.o. female with Asthma, Sarcoidosis, OSA, Rhinitis, and Hoarseness.  She had lots of trouble with her shoulders and neck >> found to have C spine stenosis.  Had injection and helped.  Her breathing has been okay.  Not having cough, wheeze, or sputum.  She gets nasal congestion and post nasal drip >> makes hard to use CPAP.  She is frustrated with lack of responsiveness from her current DME.   Physical Exam:  General - No distress ENT - No sinus  tenderness, no oral exudate, no LAN Cardiac - s1s2 regular, no murmur Chest - No wheeze/rales/dullness Back - No focal tenderness Abd - Soft, non-tender Ext - No edema Neuro - Normal strength Skin - No rashes Psych - normal mood, and behavior    Assessment/Plan:  Obstructive sleep apnea. - continue CPAP 10 cm H2O - will try to arrange for new DME  Asthma. - continue asmanex, singulair, and prn proair  Pulmonary sarcoidosis. - monitor clinically  Post-nasal drip with upper airway cough. - advised her to try nasal irrigation and nasacort   Patient Instructions  Nasal irrigation (saline nasal spray) nightly Nasacort two sprays nightly Will arrange for new home care company for CPAP supplies  Follow up in 1 year     Chesley Mires, MD [facility] Pager:  [phone] 05/18/2016, 4:19 PM

## 2016-05-18 NOTE — Patient Instructions (Signed)
Nasal irrigation (saline nasal spray) nightly Nasacort two sprays nightly Will arrange for new home care company for CPAP supplies  Follow up in 1 year

## 2016-05-25 ENCOUNTER — Other Ambulatory Visit: Payer: Self-pay | Admitting: Internal Medicine

## 2016-05-25 NOTE — Telephone Encounter (Signed)
Faxed RX to [facility] 

## 2016-05-26 MED ORDER — PEN NEEDLES 32G X 4 MM MISC: 1.0000 | Freq: Three times a day (TID) | Status: DC

## 2016-06-18 ENCOUNTER — Encounter: Payer: Medicare Other | Admitting: Internal Medicine

## 2016-06-21 ENCOUNTER — Other Ambulatory Visit: Payer: Self-pay | Admitting: Emergency Medicine

## 2016-06-21 DIAGNOSIS — M545 Low back pain: Secondary | ICD-10-CM | POA: Diagnosis not present

## 2016-06-21 DIAGNOSIS — Z6841 Body Mass Index (BMI) 40.0 and over, adult: Secondary | ICD-10-CM | POA: Diagnosis not present

## 2016-06-21 MED ORDER — ALPRAZOLAM 1 MG PO TABS: 1.0000 mg | ORAL_TABLET | Freq: Two times a day (BID) | ORAL | Status: DC | PRN

## 2016-06-21 MED ORDER — LIRAGLUTIDE 18 MG/3ML ~~LOC~~ SOPN: PEN_INJECTOR | SUBCUTANEOUS | Status: DC

## 2016-06-21 MED ORDER — OLMESARTAN MEDOXOMIL-HCTZ 40-25 MG PO TABS: 1.0000 | ORAL_TABLET | Freq: Every day | ORAL | Status: DC

## 2016-06-21 MED ORDER — NEBIVOLOL HCL 5 MG PO TABS
5.0000 mg | ORAL_TABLET | Freq: Every day | ORAL | Status: DC
Start: 2016-06-21 — End: 2016-08-10

## 2016-06-21 NOTE — Telephone Encounter (Signed)
RX faxed to [facility] 

## 2016-06-23 ENCOUNTER — Other Ambulatory Visit: Payer: Self-pay | Admitting: Neurological Surgery

## 2016-06-23 DIAGNOSIS — R87619 Unspecified abnormal cytological findings in specimens from cervix uteri: Secondary | ICD-10-CM | POA: Diagnosis not present

## 2016-06-23 DIAGNOSIS — M545 Low back pain: Secondary | ICD-10-CM

## 2016-06-23 DIAGNOSIS — N950 Postmenopausal bleeding: Secondary | ICD-10-CM | POA: Diagnosis not present

## 2016-06-23 DIAGNOSIS — Z9889 Other specified postprocedural states: Secondary | ICD-10-CM | POA: Diagnosis not present

## 2016-06-23 DIAGNOSIS — N3946 Mixed incontinence: Secondary | ICD-10-CM | POA: Diagnosis not present

## 2016-06-23 DIAGNOSIS — Z01419 Encounter for gynecological examination (general) (routine) without abnormal findings: Secondary | ICD-10-CM | POA: Diagnosis not present

## 2016-06-23 DIAGNOSIS — N951 Menopausal and female climacteric states: Secondary | ICD-10-CM | POA: Diagnosis not present

## 2016-06-23 DIAGNOSIS — L304 Erythema intertrigo: Secondary | ICD-10-CM | POA: Diagnosis not present

## 2016-06-24 ENCOUNTER — Encounter: Payer: Self-pay | Admitting: Internal Medicine

## 2016-06-24 ENCOUNTER — Telehealth: Payer: Self-pay | Admitting: Internal Medicine

## 2016-06-24 ENCOUNTER — Ambulatory Visit (INDEPENDENT_AMBULATORY_CARE_PROVIDER_SITE_OTHER): Payer: Medicare Other | Admitting: Internal Medicine

## 2016-06-24 VITALS — BP 118/82 | HR 91 | Temp 98.0°F | Resp 18 | Wt 245.0 lb

## 2016-06-24 DIAGNOSIS — F4321 Adjustment disorder with depressed mood: Secondary | ICD-10-CM | POA: Diagnosis not present

## 2016-06-24 DIAGNOSIS — M4806 Spinal stenosis, lumbar region: Secondary | ICD-10-CM

## 2016-06-24 DIAGNOSIS — M5010 Cervical disc disorder with radiculopathy, unspecified cervical region: Secondary | ICD-10-CM | POA: Diagnosis not present

## 2016-06-24 DIAGNOSIS — N939 Abnormal uterine and vaginal bleeding, unspecified: Secondary | ICD-10-CM | POA: Diagnosis not present

## 2016-06-24 DIAGNOSIS — M48061 Spinal stenosis, lumbar region without neurogenic claudication: Secondary | ICD-10-CM

## 2016-06-24 DIAGNOSIS — I10 Essential (primary) hypertension: Secondary | ICD-10-CM | POA: Diagnosis not present

## 2016-06-24 MED ORDER — METHOCARBAMOL 500 MG PO TABS: 500.0000 mg | ORAL_TABLET | Freq: Four times a day (QID) | ORAL | Status: DC | PRN

## 2016-06-24 MED ORDER — LIRAGLUTIDE 18 MG/3ML ~~LOC~~ SOPN: PEN_INJECTOR | SUBCUTANEOUS | Status: DC

## 2016-06-24 MED ORDER — OLMESARTAN MEDOXOMIL-HCTZ 40-25 MG PO TABS: 1.0000 | ORAL_TABLET | Freq: Every day | ORAL | Status: DC

## 2016-06-24 NOTE — Telephone Encounter (Signed)
Pt called in and feels that the weight that was noted on today visit is incorrect and is 10 pounds heavier ?

## 2016-06-24 NOTE — Progress Notes (Signed)
Subjective:    Patient ID: Cynthia Kirk, female    DOB: 1951-01-01, 65 y.o.   MRN: [id number]  HPI She is here for follow up.  Hypertension: She is taking her medication daily. She is compliant with a low sodium diet.  She denies chest pain, palpitations, shortness of breath and regular headaches. She is exercising regularly.    Cervical radiculopathy: The last time she was here she was experiencing pain from cervical radiculopathy. An MRI was done and she has seen neurosurgery. She did receive an injection chronic pain symptoms. She denies numbness, tingling and weakness in her arms and hands. She has used a muscle relaxer on occasion and that has helped.  Lower back pain, chronic: She will be having an MRI of her lower back soon to evaluate her pain further. She has found that the muscle relaxer also helps that.    She has been under significant stress recently. Her brother was hit by a car and had multiple injuries. One of her best friends was killed in a car accident. Her children have also had several issues and she has felt somewhat depressed. She feels the depression is somewhat normal and it tends to occur more in the evening. She does not feel this is an issue that she needs any treatment for it.   Medications and allergies reviewed with patient and updated if appropriate.  Patient Active Problem List   Diagnosis Date Noted  . Cervical disc disorder with radiculopathy of cervical region 03/13/2016  . B12 deficiency   . Lumbar spinal stenosis 03/07/2013  . Ejection fraction   . Syncope   . Severe obesity (BMI >= 40) ([place]) 09/14/2010  . Essential hypertension 09/14/2010  . ANEMIA-IRON DEFICIENCY 03/20/2010  . Menopausal and postmenopausal disorder 03/20/2010  . GERD 08/22/2009  . Pulmonary sarcoidosis ([place]) 06/30/2009  . Diabetes mellitus type 2, diet-controlled ([place]) 06/30/2009  . OSA on CPAP 06/30/2009  . Upper airway cough syndrome 06/30/2009  . Mild persistent asthma  06/30/2009    Current Outpatient Prescriptions on File Prior to Visit  Medication Sig Dispense Refill  . albuterol (PROVENTIL HFA;VENTOLIN HFA) 108 (90 BASE) MCG/ACT inhaler Inhale 2 puffs into the lungs every 6 (six) hours as needed for wheezing or shortness of breath.    [name] albuterol (PROVENTIL) (2.5 MG/3ML) 0.083% nebulizer solution Take 3 mLs (2.5 mg total) by nebulization every 6 (six) hours as needed for shortness of breath. 75 mL 5  . ALPRAZolam (XANAX) 1 MG tablet Take 1 tablet (1 mg total) by mouth 2 (two) times daily as needed. 180 tablet 0  . Blood Glucose Monitoring Suppl (ONE TOUCH ULTRA SYSTEM KIT) w/Device KIT Test blood sugars daily. DX E11.9 1 each 0  . Coenzyme Q10 (COQ10) 200 MG CAPS Take 1 capsule by mouth daily.    [name] dexlansoprazole (DEXILANT) 60 MG capsule Take 1 capsule (60 mg total) by mouth daily. 90 capsule 3  . glucose blood (ONE TOUCH ULTRA TEST) test strip Use to check blood sugars twice a day Dx: E11.9 200 each 3  . hydroquinone 4 % cream Apply topically 2 (two) times daily. 28.35 g 0  . Insulin Pen Needle (PEN NEEDLES) 32G X 4 MM MISC 1 applicator by Does not apply route 3 (three) times daily. 100 each 1  . Lancets (ONETOUCH ULTRASOFT) lancets Use to check blood sugar twice a day. Dx E11.9 200 each 3  . Liraglutide (VICTOZA) 18 MG/3ML SOPN INJECT 0.6 MG SQ DAILY X 1  WEEK. THEN 1.2 MG SQ DAILY X 1 WEEK THEN 1.8 MG SQ DAILY 9 pen 0  . medroxyPROGESTERone (PROVERA) 10 MG tablet Take 10 mg by mouth See admin instructions. Take '10mg'$  daily for 14 days Every 3 months.    . methocarbamol (ROBAXIN) 500 MG tablet Take 1 tablet (500 mg total) by mouth every 6 (six) hours as needed for muscle spasms. 60 tablet 2  . mometasone (ASMANEX 60 METERED DOSES) 220 MCG/INH inhaler INHALE 2 PUFFS INTO THE LUNGS DAILY. 1 Inhaler 6  . montelukast (SINGULAIR) 10 MG tablet TAKE 1 TABLET (10 MG TOTAL) BY MOUTH AT BEDTIME. 30 tablet 5  . nebivolol (BYSTOLIC) 5 MG tablet Take 1 tablet (5 mg  total) by mouth daily. 90 tablet 0  . nystatin cream (MYCOSTATIN) APPLY 1 G TOPICALLY 3 (THREE) TIMES DAILY. 60 g 0  . olmesartan-hydrochlorothiazide ([name] HCT) 40-25 MG tablet Take 1 tablet by mouth daily. 30 tablet 0  . oxyCODONE (OXYCONTIN) 10 mg 12 hr tablet Take 1 tablet (10 mg total) by mouth every 12 (twelve) hours. 60 tablet 0  . polyethylene glycol powder (GLYCOLAX/MIRALAX) powder Take 255 g by mouth daily as needed for moderate constipation. 527 g 11  . PROCTOSOL HC 2.5 % rectal cream APPLY RECTALLY 2 (TWO) TIMES DAILY. 28.35 g 0  . ranitidine (ZANTAC) 300 MG tablet TAKE 1 TABLET (300 MG TOTAL) BY MOUTH AT BEDTIME. 90 tablet 3  . triamcinolone (NASACORT AQ) 55 MCG/ACT AERO nasal inhaler Place 2 sprays into the nose daily. 1 Inhaler 12   No current facility-administered medications on file prior to visit.    Past Medical History  Diagnosis Date  . Hiatal hernia   . Morbid obesity ([place]) 2010  . Esophageal reflux   . Allergic rhinitis, cause unspecified   . Unspecified asthma(493.90)   . Sarcoidosis [facility]) 1996    @ [place]  . Iron deficiency anemia   . Syncope     ?? Syncope ??  . Ejection fraction     EF 45-50%, echo, October 27, 2011  . H/O menorrhagia 2011  . Endometrial mass 2011  . Urinary incontinence, mixed 2011  . Simple endometrial hyperplasia 2011  . Herpes simplex type 2 infection complicating pregnancy   . ASCUS with positive high risk HPV   . VIN II (vulvar intraepithelial neoplasia II)   . Fibroid   . Anxiety   . H/O Clostridium difficile infection 03/2010  . Adenomatous colon polyp   . Blood transfusion 1976    Due to ectopic pregnancy  . Abnormal Pap smear 2008  . Obstructive sleep apnea (adult) (pediatric)     cpap  . Pneumonia 2005; 2007; 2009; 2011  . Type 2 diabetes, diet controlled ([place])   . Spinal stenosis, lumbar     s/p decompression laminectomy 09/2013  . Chronic back pain   . Panic attacks     Past Surgical History  Procedure  Laterality Date  . Vulvectomy  ~ 2007    "for cancerous cells" (09/05/2013)  . Ectopic pregnancy surgery  1976; ~ 1978  . Urethral sling  2011  . Dilation and curettage of uterus  2007  . Posterior laminectomy / decompression lumbar spine  09/05/2013  . Thoracic laminectomy  09/05/2013  . Posterior lumbar fusion  09/05/2013  . Hysteroscopy diagnostic  2011  . Colposcopy vulva w/ biopsy      "I've had maybe 3" (09/05/2013)  . Maximum access (mas)posterior lumbar interbody fusion (plif) 1 level N/A 09/05/2013  Procedure: Lumbar four-five Maximum Access Surgery  Posterior lumbar interbody fusion;  Surgeon: Eustace Moore, MD;  Location: [place] [facility];  Service: Neurosurgery;  Laterality: N/A;  Lumbar four-five Maximum Access Surgery  Posterior lumbar interbody fusion  . Lumbar laminectomy/decompression microdiscectomy N/A 09/05/2013    Procedure: Thoracic eleven-twelve Thoracic Laminectomy;  Surgeon: Eustace Moore, MD;  Location: [place] [facility];  Service: Neurosurgery;  Laterality: N/A;  Thoracic eleven-twelve Thoracic Laminectomy    Social History   Social History  . Marital Status: Married    Spouse Name: N/A  . Number of Children: 2  . Years of Education: PHD   Occupational History  . retired   .     Social History Main Topics  . Smoking status: Never Smoker   . Smokeless tobacco: Never Used  . Alcohol Use: Yes     Comment: 09/05/2013 "might have a glass of wine maybe q couple months"  . Drug Use: No  . Sexual Activity: Yes    Birth Control/ Protection: Post-menopausal   Other Topics Concern  . None   Social History Narrative   Pt lives at home with her spouse.   She does not use caffeine.    Family History  Problem Relation Age of Onset  . Heart attack Father   . Hypertension Father   . Diabetes Father   . Seizures Father   . Migraines Father   . Kidney disease Sister   . Osteoporosis Sister   . Heart attack Sister   . Asthma Daughter   . Asthma Son   . Osteoporosis  Sister   . Lung cancer Maternal Uncle   . Cancer Paternal Uncle   . Kidney disease Other   . Diabetes Other   . High Cholesterol Brother     x2    Review of Systems  Constitutional: Negative for fever.  Respiratory: Negative for cough, shortness of breath and wheezing.   Cardiovascular: Positive for leg swelling (occasional). Negative for chest pain and palpitations.  Neurological: Positive for light-headedness (not eating). Negative for headaches.       Objective:   Filed Vitals:   06/24/16 1116  BP: 118/82  Pulse: 91  Temp: 98 F (36.7 C)  Resp: 18   Filed Weights   06/24/16 1116  Weight: 254 lb (115.214 kg)   Body mass index is 43.26 kg/(m^2).   Physical Exam  Constitutional: She appears well-developed and well-nourished. No distress.  HENT:  Head: Normocephalic and atraumatic.  Neck: No tracheal deviation present. No thyromegaly present.  Cardiovascular: Normal rate, regular rhythm and normal heart sounds.   Pulmonary/Chest: Effort normal. No respiratory distress. She has no wheezes. She has no rales.  Musculoskeletal: She exhibits no edema.  No neck tenderness with palpation  Lymphadenopathy:    She has no cervical adenopathy.  Neurological:  Normal strength and sensation bilateral upper extremities  Skin: She is not diaphoretic.  Psychiatric:  Depressed mood           Assessment & Plan:   See Problem List for Assessment and Plan of chronic medical problems.

## 2016-06-24 NOTE — Assessment & Plan Note (Signed)
Following with neurosurgery MRI scheduled Can use muscle relaxer as needed

## 2016-06-24 NOTE — Assessment & Plan Note (Signed)
Did see neurosurgery and had an injection, which improved symptoms significantly Okay to continue muscle relaxer as needed Discontinue OxyContin Can follow up with neurosurgery as needed

## 2016-06-24 NOTE — Assessment & Plan Note (Signed)
She is feeling some depression at this time, which is normal. Everything going on We both feel this is not a major issue and will improve with time She has good social support and coping mechanisms she will let me know if she feels therapy or medication is needed

## 2016-06-24 NOTE — Patient Instructions (Signed)
   Medications reviewed and updated.  No changes recommended at this time.  Your prescription(s) have been submitted to your pharmacy. Please take as directed and contact our office if you believe you are having problem(s) with the medication(s).      

## 2016-06-24 NOTE — Assessment & Plan Note (Signed)
BP well controlled Current regimen effective and well tolerated Continue current medications at current doses  

## 2016-06-24 NOTE — Progress Notes (Signed)
Pre visit review using our clinic review tool, if applicable. No additional management support is needed unless otherwise documented below in the visit note. 

## 2016-06-25 NOTE — Telephone Encounter (Signed)
Noted. Pt can monitor at home.

## 2016-07-01 ENCOUNTER — Ambulatory Visit (INDEPENDENT_AMBULATORY_CARE_PROVIDER_SITE_OTHER): Payer: Medicare Other | Admitting: Podiatry

## 2016-07-01 ENCOUNTER — Encounter: Payer: Self-pay | Admitting: Podiatry

## 2016-07-01 ENCOUNTER — Ambulatory Visit (INDEPENDENT_AMBULATORY_CARE_PROVIDER_SITE_OTHER): Payer: Medicare Other

## 2016-07-01 VITALS — BP 107/55 | HR 78 | Resp 16

## 2016-07-01 DIAGNOSIS — E119 Type 2 diabetes mellitus without complications: Secondary | ICD-10-CM | POA: Diagnosis not present

## 2016-07-01 DIAGNOSIS — M7750 Other enthesopathy of unspecified foot: Secondary | ICD-10-CM

## 2016-07-01 DIAGNOSIS — M779 Enthesopathy, unspecified: Secondary | ICD-10-CM

## 2016-07-01 DIAGNOSIS — E1142 Type 2 diabetes mellitus with diabetic polyneuropathy: Secondary | ICD-10-CM | POA: Diagnosis not present

## 2016-07-01 MED ORDER — GABAPENTIN 100 MG PO CAPS: 100.0000 mg | ORAL_CAPSULE | Freq: Every day | ORAL | 3 refills | Status: DC

## 2016-07-01 NOTE — Progress Notes (Signed)
   Subjective:    Patient ID: Cynthia Kirk, female    DOB: October 31, 1951, 65 y.o.   MRN: [id number]  HPI: She presents today with a chief complaint of painful hallux nails bilaterally. She states that these up involving her for quite some time. She been multiple doctors who recommended removal of the nail. She has a history of diabetes which is well controlled with a hemoglobin A1c of 5.6. She states that she has some nocturnal numbness and tingling as well as burning his and throbbing to the hallux bilateral. She denies any trauma. She states that wearing closed shoes is extremely painful to her hallux nails bilaterally.    Review of Systems  HENT: Positive for sinus pressure.   Cardiovascular: Positive for leg swelling.  Genitourinary: Positive for frequency and urgency.  Musculoskeletal: Positive for arthralgias.  Psychiatric/Behavioral: The patient is nervous/anxious.   All other systems reviewed and are negative.      Objective:   Physical Exam: Vital signs are stable alert and oriented 3. Pulses are strongly palpable neurologic sensorium is intact per Semmes-Weinstein monofilament deep tendon reflexes are equal and symmetrical bilateral. Muscle strength is equal and symmetrical bilateral. Orthopedic evaluation demonstrates all joints distal to the ankle for range of motion without crepitation. She has pain on palpation of the hallux nail plates bilaterally left greater than right. Radiographic evaluation does demonstrate small subungual exostosis to the hallux bilaterally left greater than right. Hallux nail plates Beard be slightly more thickened left hallux but has full range of motion at the metatarsophalangeal joint. No open lesions or wounds are noted.        Assessment & Plan:  Diabetes mellitus with early diabetic peripheral neuropathy. Subungual exostosis hallux bilateral.  Plan: We discussed surgical intervention today regarding a subungual exostectomy. We consented her for  subungual exostectomy of the hallux bilateral. I discussed this with her and her husband in great detail today the both understand the signs are amendable to it. We discussed the possible postop complications which may include but are not limited to postop pain bleeding swelling infection recurrence and need for further surgery. We also discussed the possibility that the toenails May turn loosen fall and may not grow normally again.  Because of the numbness and tingling and burning that she develops at night time I also prescribed gabapentin 100 mg 1 by mouth daily at bedtime. I will follow-up with her in the near future for surgical intervention.

## 2016-07-20 DIAGNOSIS — H524 Presbyopia: Secondary | ICD-10-CM | POA: Diagnosis not present

## 2016-07-20 DIAGNOSIS — H40031 Anatomical narrow angle, right eye: Secondary | ICD-10-CM | POA: Diagnosis not present

## 2016-07-20 DIAGNOSIS — H40032 Anatomical narrow angle, left eye: Secondary | ICD-10-CM | POA: Diagnosis not present

## 2016-07-20 DIAGNOSIS — E119 Type 2 diabetes mellitus without complications: Secondary | ICD-10-CM | POA: Diagnosis not present

## 2016-07-20 LAB — HM DIABETES EYE EXAM

## 2016-07-21 ENCOUNTER — Encounter: Payer: Self-pay | Admitting: Internal Medicine

## 2016-07-23 ENCOUNTER — Encounter: Payer: Self-pay | Admitting: Internal Medicine

## 2016-07-23 ENCOUNTER — Ambulatory Visit (INDEPENDENT_AMBULATORY_CARE_PROVIDER_SITE_OTHER): Payer: Medicare Other | Admitting: Internal Medicine

## 2016-07-23 ENCOUNTER — Other Ambulatory Visit (INDEPENDENT_AMBULATORY_CARE_PROVIDER_SITE_OTHER): Payer: Medicare Other

## 2016-07-23 VITALS — BP 118/82 | HR 93 | Temp 98.6°F | Resp 16 | Wt 241.0 lb

## 2016-07-23 DIAGNOSIS — Z114 Encounter for screening for human immunodeficiency virus [HIV]: Secondary | ICD-10-CM

## 2016-07-23 DIAGNOSIS — E119 Type 2 diabetes mellitus without complications: Secondary | ICD-10-CM

## 2016-07-23 DIAGNOSIS — Z1159 Encounter for screening for other viral diseases: Secondary | ICD-10-CM | POA: Diagnosis not present

## 2016-07-23 DIAGNOSIS — N281 Cyst of kidney, acquired: Secondary | ICD-10-CM

## 2016-07-23 DIAGNOSIS — Q6100 Congenital renal cyst, unspecified: Secondary | ICD-10-CM

## 2016-07-23 DIAGNOSIS — R32 Unspecified urinary incontinence: Secondary | ICD-10-CM

## 2016-07-23 DIAGNOSIS — I10 Essential (primary) hypertension: Secondary | ICD-10-CM

## 2016-07-23 DIAGNOSIS — K219 Gastro-esophageal reflux disease without esophagitis: Secondary | ICD-10-CM

## 2016-07-23 LAB — COMPREHENSIVE METABOLIC PANEL
ALK PHOS: 90 U/L (ref 39–117)
ALT: 11 U/L (ref 0–35)
AST: 21 U/L (ref 0–37)
Albumin: 4 g/dL (ref 3.5–5.2)
BUN: 12 mg/dL (ref 6–23)
CALCIUM: 9.6 mg/dL (ref 8.4–10.5)
CO2: 33 mEq/L — ABNORMAL HIGH (ref 19–32)
Chloride: 101 mEq/L (ref 96–112)
Creatinine, Ser: 0.83 mg/dL (ref 0.40–1.20)
GFR: 88.61 mL/min (ref >60.00)
GLUCOSE: 90 mg/dL (ref 70–99)
POTASSIUM: 4 meq/L (ref 3.5–5.1)
Sodium: 140 mEq/L (ref 135–145)
TOTAL PROTEIN: 7.1 g/dL (ref 6.0–8.3)
Total Bilirubin: 0.6 mg/dL (ref 0.2–1.2)

## 2016-07-23 LAB — CBC WITH DIFFERENTIAL/PLATELET
Basophils Absolute: 0 10*3/uL (ref 0.0–0.1)
Basophils Relative: 0.5 % (ref 0.0–3.0)
Eosinophils Absolute: 0.1 10*3/uL (ref 0.0–0.7)
Eosinophils Relative: 1.1 % (ref 0.0–5.0)
HEMATOCRIT: 36.7 % (ref 36.0–46.0)
HEMOGLOBIN: 12.2 g/dL (ref 12.0–15.0)
LYMPHS PCT: 15.2 % (ref 12.0–46.0)
Lymphs Abs: 1 10*3/uL (ref 0.7–4.0)
MCHC: 33.2 g/dL (ref 30.0–36.0)
MCV: 90.8 fl (ref 78.0–100.0)
MONOS PCT: 5.8 % (ref 3.0–12.0)
Monocytes Absolute: 0.4 10*3/uL (ref 0.1–1.0)
Neutro Abs: 5.2 10*3/uL (ref 1.4–7.7)
Neutrophils Relative %: 77.4 % — ABNORMAL HIGH (ref 43.0–77.0)
Platelets: 270 10*3/uL (ref 150.0–400.0)
RBC: 4.04 Mil/uL (ref 3.87–5.11)
RDW: 13.8 % (ref 11.5–15.5)
WBC: 6.7 10*3/uL (ref 4.0–10.5)

## 2016-07-23 LAB — LIPID PANEL
CHOL/HDL RATIO: 3
Cholesterol: 191 mg/dL (ref 0–200)
HDL: 59.8 mg/dL (ref >39.00)
LDL Cholesterol: 114 mg/dL — ABNORMAL HIGH (ref 0–99)
NONHDL: 131.5
TRIGLYCERIDES: 86 mg/dL (ref 0.0–149.0)
VLDL: 17.2 mg/dL (ref 0.0–40.0)

## 2016-07-23 LAB — URINALYSIS, ROUTINE W REFLEX MICROSCOPIC
Bilirubin Urine: NEGATIVE
Hgb urine dipstick: NEGATIVE
Ketones, ur: NEGATIVE
Leukocytes, UA: NEGATIVE
Nitrite: NEGATIVE
PH: 8 (ref 5.0–8.0)
RBC / HPF: NONE SEEN (ref 0–?)
SPECIFIC GRAVITY, URINE: 1.01 (ref 1.000–1.030)
TOTAL PROTEIN, URINE-UPE24: NEGATIVE
Urine Glucose: NEGATIVE
Urobilinogen, UA: 1 (ref 0.0–1.0)

## 2016-07-23 LAB — TSH: TSH: 1.64 u[IU]/mL (ref 0.35–4.50)

## 2016-07-23 LAB — MICROALBUMIN / CREATININE URINE RATIO
Creatinine,U: 88 mg/dL
MICROALB/CREAT RATIO: 0.8 mg/g (ref 0.0–30.0)
Microalb, Ur: <0.7 mg/dL (ref 0.0–1.9)

## 2016-07-23 LAB — HEMOGLOBIN A1C: Hgb A1c MFr Bld: 5 % (ref 4.6–6.5)

## 2016-07-23 LAB — HEPATITIS C ANTIBODY: HCV AB: NEGATIVE

## 2016-07-23 MED ORDER — HYDROCORTISONE 2.5 % RE CREA: TOPICAL_CREAM | RECTAL | 2 refills | Status: DC

## 2016-07-23 MED ORDER — CLOTRIMAZOLE-BETAMETHASONE 1-0.05 % EX CREA: TOPICAL_CREAM | Freq: Two times a day (BID) | CUTANEOUS | 3 refills | Status: DC

## 2016-07-23 MED ORDER — HYDROQUINONE 4 % EX CREA: TOPICAL_CREAM | Freq: Two times a day (BID) | CUTANEOUS | 0 refills | Status: DC

## 2016-07-23 NOTE — Assessment & Plan Note (Signed)
Seen on prior ct scan, family history of kidney disease Check [place] Check UA, CMP

## 2016-07-23 NOTE — Assessment & Plan Note (Signed)
GERD controlled Continue daily medication  

## 2016-07-23 NOTE — Assessment & Plan Note (Signed)
BP well controlled Current regimen effective and well tolerated Continue current medications at current doses cmp  

## 2016-07-23 NOTE — Progress Notes (Signed)
Pre visit review using our clinic review tool, if applicable. No additional management support is needed unless otherwise documented below in the visit note. 

## 2016-07-23 NOTE — Patient Instructions (Addendum)
  Test(s) ordered today. Your results will be released to [facility] (or called to you) after review, usually within 72hours after test completion. If any changes need to be made, you will be notified at that same time.  All other Health Maintenance issues reviewed.   All recommended immunizations and age-appropriate screenings are up-to-date or discussed.  No immunizations administered today.   Medications reviewed and updated.  No changes recommended at this time.  An [place] of your kidney was ordered  A referral for urology was ordered.   Please followup in 6 months

## 2016-07-23 NOTE — Progress Notes (Signed)
Subjective:    Patient ID: Cynthia Kirk, female    DOB: 1951-05-31, 65 y.o.   MRN: [id number]  HPI She is here for follow up.   Her 52 year old sister just died of a heart attack.  She is worried about her  Cardiac risk.  She will follow up with her cardiologist.  She denies chest  Pain or palpitations besides with anxiety/stress.  She is exercising.   Diabetes: She is taking her medication daily as prescribed. She is compliant with a diabetic diet. She is exercising regularly. She checks her feet daily and denies foot lesions. She is up-to-date with an ophthalmology examination.   GERD:  She is taking her medication daily as prescribed.  She denies any GERD symptoms and feels her GERD is well controlled.   Hypertension: She is taking her medication daily. She is compliant with a low sodium diet.  She denies chest pain, palpitations, edema, shortness of breath and regular headaches. She is exercising regularly.  She does not monitor her blood pressure at home.     Medications and allergies reviewed with patient and updated if appropriate.  Patient Active Problem List   Diagnosis Date Noted  . Renal cyst 07/23/2016  . Situational depression 06/24/2016  . Cervical disc disorder with radiculopathy of cervical region 03/13/2016  . B12 deficiency   . Cataract 02/25/2014  . Glaucoma suspect 02/25/2014  . Lumbar spinal stenosis 03/07/2013  . Ejection fraction   . Syncope   . Severe obesity (BMI >= 40) ([place]) 09/14/2010  . Essential hypertension 09/14/2010  . ANEMIA-IRON DEFICIENCY 03/20/2010  . Menopausal and postmenopausal disorder 03/20/2010  . GERD 08/22/2009  . Pulmonary sarcoidosis ([place]) 06/30/2009  . Diabetes mellitus ([place]) 06/30/2009  . OSA on CPAP 06/30/2009  . Upper airway cough syndrome 06/30/2009  . Mild persistent asthma 06/30/2009    Current Outpatient Prescriptions on File Prior to Visit  Medication Sig Dispense Refill  . albuterol (PROVENTIL HFA;VENTOLIN HFA)  108 (90 BASE) MCG/ACT inhaler Inhale 2 puffs into the lungs every 6 (six) hours as needed for wheezing or shortness of breath.    [name] albuterol (PROVENTIL) (2.5 MG/3ML) 0.083% nebulizer solution Take 3 mLs (2.5 mg total) by nebulization every 6 (six) hours as needed for shortness of breath. 75 mL 5  . ALPRAZolam (XANAX) 1 MG tablet Take 1 tablet (1 mg total) by mouth 2 (two) times daily as needed. 180 tablet 0  . Blood Glucose Monitoring Suppl (ONE TOUCH ULTRA SYSTEM KIT) w/Device KIT Test blood sugars daily. DX E11.9 1 each 0  . Coenzyme Q10 (COQ10) 200 MG CAPS Take 1 capsule by mouth daily.    [name] dexlansoprazole (DEXILANT) 60 MG capsule Take 1 capsule (60 mg total) by mouth daily. 90 capsule 3  . ESTRACE VAGINAL 0.1 MG/GM vaginal cream     . gabapentin (NEURONTIN) 100 MG capsule Take 1 capsule (100 mg total) by mouth at bedtime. 30 capsule 3  . glucose blood (ONE TOUCH ULTRA TEST) test strip Use to check blood sugars twice a day Dx: E11.9 200 each 3  . Insulin Pen Needle (PEN NEEDLES) 32G X 4 MM MISC 1 applicator by Does not apply route 3 (three) times daily. 100 each 1  . Lancets (ONETOUCH ULTRASOFT) lancets Use to check blood sugar twice a day. Dx E11.9 200 each 3  . Liraglutide (VICTOZA) 18 MG/3ML SOPN INJECT 1.8 MG SQ DAILY 9 pen 0  . methocarbamol (ROBAXIN) 500 MG tablet Take 1 tablet (  500 mg total) by mouth every 6 (six) hours as needed for muscle spasms. 90 tablet 2  . mometasone (ASMANEX 60 METERED DOSES) 220 MCG/INH inhaler INHALE 2 PUFFS INTO THE LUNGS DAILY. 1 Inhaler 6  . montelukast (SINGULAIR) 10 MG tablet TAKE 1 TABLET (10 MG TOTAL) BY MOUTH AT BEDTIME. 30 tablet 5  . nebivolol (BYSTOLIC) 5 MG tablet Take 1 tablet (5 mg total) by mouth daily. 90 tablet 0  . nystatin cream (MYCOSTATIN) APPLY 1 G TOPICALLY 3 (THREE) TIMES DAILY. 60 g 0  . olmesartan-hydrochlorothiazide ([name] HCT) 40-25 MG tablet Take 1 tablet by mouth daily. 30 tablet 11  . ranitidine (ZANTAC) 300 MG tablet TAKE 1  TABLET (300 MG TOTAL) BY MOUTH AT BEDTIME. 90 tablet 3  . triamcinolone (NASACORT AQ) 55 MCG/ACT AERO nasal inhaler Place 2 sprays into the nose daily. 1 Inhaler 12   No current facility-administered medications on file prior to visit.     Past Medical History:  Diagnosis Date  . Abnormal Pap smear 2008  . Adenomatous colon polyp   . Allergic rhinitis, cause unspecified   . Anxiety   . ASCUS with positive high risk HPV   . Blood transfusion 1976   Due to ectopic pregnancy  . Chronic back pain   . Ejection fraction    EF 45-50%, echo, October 27, 2011  . Endometrial mass 2011  . Esophageal reflux   . Fibroid   . H/O Clostridium difficile infection 03/2010  . H/O menorrhagia 2011  . Herpes simplex type 2 infection complicating pregnancy   . Hiatal hernia   . Iron deficiency anemia   . Morbid obesity ([place]) 2010  . Obstructive sleep apnea (adult) (pediatric)    cpap  . Panic attacks   . Pneumonia 2005; 2007; 2009; 2011  . Sarcoidosis [facility]) 1996   @ [place]  . Simple endometrial hyperplasia 2011  . Spinal stenosis, lumbar    s/p decompression laminectomy 09/2013  . Syncope    ?? Syncope ??  . Type 2 diabetes, diet controlled ([place])   . Unspecified asthma(493.90)   . Urinary incontinence, mixed 2011  . VIN II (vulvar intraepithelial neoplasia II)     Past Surgical History:  Procedure Laterality Date  . COLPOSCOPY VULVA W/ BIOPSY     "I've had maybe 3" (09/05/2013)  . DILATION AND CURETTAGE OF UTERUS  2007  . [place]; ~ 1978  . HYSTEROSCOPY DIAGNOSTIC  2011  . LUMBAR LAMINECTOMY/DECOMPRESSION MICRODISCECTOMY N/A 09/05/2013   Procedure: Thoracic eleven-twelve Thoracic Laminectomy;  Surgeon: Eustace Moore, MD;  Location: [place] [facility];  Service: Neurosurgery;  Laterality: N/A;  Thoracic eleven-twelve Thoracic Laminectomy  . MAXIMUM ACCESS (MAS)POSTERIOR LUMBAR INTERBODY FUSION (PLIF) 1 LEVEL N/A 09/05/2013   Procedure: Lumbar four-five Maximum Access  Surgery  Posterior lumbar interbody fusion;  Surgeon: Eustace Moore, MD;  Location: [place] [facility]S;  Service: Neurosurgery;  Laterality: N/A;  Lumbar four-five Maximum Access Surgery  Posterior lumbar interbody fusion  . POSTERIOR LAMINECTOMY / DECOMPRESSION LUMBAR SPINE  09/05/2013  . POSTERIOR LUMBAR FUSION  09/05/2013  . THORACIC LAMINECTOMY  09/05/2013  . URETHRAL SLING  2011  . VULVECTOMY  ~ 2007   "for cancerous cells" (09/05/2013)    Social History   Social History  . Marital status: Married    Spouse name: N/A  . Number of children: 2  . Years of education: PHD   Occupational History  . retired   .  Unemployed  Social History Main Topics  . Smoking status: Never Smoker  . Smokeless tobacco: Never Used  . Alcohol use Yes     Comment: 09/05/2013 "might have a glass of wine maybe q couple months"  . Drug use: No  . Sexual activity: Yes    Birth control/ protection: Post-menopausal   Other Topics Concern  . None   Social History Narrative   Pt lives at home with her spouse.   She does not use caffeine.    Family History  Problem Relation Age of Onset  . Heart attack Father   . Hypertension Father   . Diabetes Father   . Seizures Father   . Migraines Father   . Kidney disease Sister   . Osteoporosis Sister   . Heart attack Sister   . Cancer Paternal Uncle   . Asthma Daughter   . Asthma Son   . Osteoporosis Sister   . Lung cancer Maternal Uncle   . Kidney disease Other   . Diabetes Other   . High Cholesterol Brother     x2    Review of Systems  Constitutional: Negative for chills and fever.  Respiratory: Negative for cough, shortness of breath and wheezing.   Cardiovascular: Positive for chest pain (? emotional), palpitations (with anxiety) and leg swelling (feet).  Neurological: Negative for dizziness, light-headedness and headaches.       Objective:   Vitals:   07/23/16 1036  BP: 118/82  Pulse: 93  Resp: 16  Temp: 98.6 F (37 C)   Filed  Weights   07/23/16 1036  Weight: 241 lb (109.3 kg)   Body mass index is 41.05 kg/m.   Physical Exam Constitutional: Appears well-developed and well-nourished. No distress.  HENT:  Head: Normocephalic and atraumatic.  Neck: Neck supple. No tracheal deviation present. No thyromegaly present.  Cardiovascular: Normal rate, regular rhythm and normal heart sounds.   No murmur heard. No carotid bruit  Pulmonary/Chest: Effort normal and breath sounds normal. No respiratory distress. No has no wheezes. No rales.  Musculoskeletal: No edema.  Lymphadenopathy: No cervical adenopathy.  Skin: Skin is warm and dry. Not diaphoretic.  Psychiatric: Normal mood and affect. Behavior is normal.       Assessment & Plan:   See Problem List for Assessment and Plan of chronic medical problems.

## 2016-07-23 NOTE — Assessment & Plan Note (Signed)
encouraged regular exercise Decreased portions Stressed weight loss

## 2016-07-23 NOTE — Assessment & Plan Note (Signed)
Lab Results  Component Value Date   HGBA1C 5.4 07/09/2015   Check a1c, urine microalbumin Encouraged weight loss Stressed regular exercise

## 2016-07-24 ENCOUNTER — Encounter: Payer: Self-pay | Admitting: Internal Medicine

## 2016-07-24 LAB — HIV ANTIBODY (ROUTINE TESTING W REFLEX): HIV 1&2 Ab, 4th Generation: NONREACTIVE

## 2016-07-28 ENCOUNTER — Encounter: Payer: Self-pay | Admitting: *Deleted

## 2016-07-31 ENCOUNTER — Encounter: Payer: Self-pay | Admitting: Internal Medicine

## 2016-07-31 NOTE — Progress Notes (Signed)
Medical clearance letter written - [facility]  Dr Ivin Booty

## 2016-08-03 ENCOUNTER — Telehealth: Payer: Self-pay | Admitting: Emergency Medicine

## 2016-08-03 NOTE — Telephone Encounter (Signed)
Surgical Clearance faxed to number given by Dr Milinda Pointer

## 2016-08-05 ENCOUNTER — Encounter: Payer: Self-pay | Admitting: Internal Medicine

## 2016-08-06 ENCOUNTER — Other Ambulatory Visit: Payer: Self-pay | Admitting: Emergency Medicine

## 2016-08-06 MED ORDER — MONTELUKAST SODIUM 10 MG PO TABS: ORAL_TABLET | ORAL | 1 refills | Status: DC

## 2016-08-06 MED ORDER — LIRAGLUTIDE 18 MG/3ML ~~LOC~~ SOPN: PEN_INJECTOR | SUBCUTANEOUS | 2 refills | Status: DC

## 2016-08-10 ENCOUNTER — Other Ambulatory Visit: Payer: Self-pay | Admitting: Emergency Medicine

## 2016-08-10 MED ORDER — MONTELUKAST SODIUM 10 MG PO TABS: ORAL_TABLET | ORAL | 3 refills | Status: DC

## 2016-08-10 MED ORDER — OLMESARTAN MEDOXOMIL-HCTZ 40-25 MG PO TABS: 1.0000 | ORAL_TABLET | Freq: Every day | ORAL | 3 refills | Status: DC

## 2016-08-10 MED ORDER — LIRAGLUTIDE 18 MG/3ML ~~LOC~~ SOPN: PEN_INJECTOR | SUBCUTANEOUS | 2 refills | Status: DC

## 2016-08-10 MED ORDER — NEBIVOLOL HCL 5 MG PO TABS
5.0000 mg | ORAL_TABLET | Freq: Every day | ORAL | 3 refills | Status: DC
Start: 2016-08-10 — End: 2017-10-24

## 2016-08-11 ENCOUNTER — Other Ambulatory Visit: Payer: Self-pay | Admitting: Internal Medicine

## 2016-08-11 DIAGNOSIS — N281 Cyst of kidney, acquired: Secondary | ICD-10-CM

## 2016-08-16 ENCOUNTER — Telehealth: Payer: Self-pay | Admitting: Emergency Medicine

## 2016-08-16 DIAGNOSIS — K7689 Other specified diseases of liver: Secondary | ICD-10-CM

## 2016-08-16 DIAGNOSIS — N281 Cyst of kidney, acquired: Secondary | ICD-10-CM

## 2016-08-16 NOTE — Telephone Encounter (Signed)
[facility] called and stated there are orders in for the patient to have a Renal and  Abdominal limited ultrasound. Can we combine those 2 and do an Abdominal Complete untrasound and they can see the kidney on the Abdominal Complete. IMG code is 524. Please follow up thanks.

## 2016-08-16 NOTE — Telephone Encounter (Signed)
Please advise 

## 2016-08-17 ENCOUNTER — Telehealth: Payer: Self-pay | Admitting: *Deleted

## 2016-08-17 NOTE — Telephone Encounter (Signed)
"  I'm scheduled for surgery next month.  I need to make some changes to that.  I'm scheduled for the 6th of October.  So if you could kindly give me a call back I'd appreciate it much."

## 2016-08-17 NOTE — Telephone Encounter (Signed)
Abd complete US ordered

## 2016-08-17 NOTE — Addendum Note (Signed)
Addended by: [name] on: 08/17/2016 05:30 AM   Modules accepted: Orders

## 2016-08-19 ENCOUNTER — Ambulatory Visit (INDEPENDENT_AMBULATORY_CARE_PROVIDER_SITE_OTHER): Payer: Medicare Other | Admitting: General Practice

## 2016-08-19 ENCOUNTER — Ambulatory Visit
Admission: RE | Admit: 2016-08-19 | Discharge: 2016-08-19 | Disposition: A | Discharge status: Home / Self Care | Payer: Medicare Other | Source: Ambulatory Visit | Attending: Neurological Surgery | Admitting: Neurological Surgery

## 2016-08-19 ENCOUNTER — Ambulatory Visit: Admission: RE | Admit: 2016-08-19 | Payer: 59 | Source: Ambulatory Visit

## 2016-08-19 ENCOUNTER — Ambulatory Visit
Admission: RE | Admit: 2016-08-19 | Discharge: 2016-08-19 | Disposition: A | Discharge status: Home / Self Care | Payer: Medicare Other | Source: Ambulatory Visit | Attending: Internal Medicine | Admitting: Internal Medicine

## 2016-08-19 ENCOUNTER — Telehealth: Payer: Self-pay

## 2016-08-19 ENCOUNTER — Other Ambulatory Visit: Payer: 59

## 2016-08-19 DIAGNOSIS — M545 Low back pain: Secondary | ICD-10-CM

## 2016-08-19 DIAGNOSIS — N281 Cyst of kidney, acquired: Secondary | ICD-10-CM | POA: Diagnosis not present

## 2016-08-19 DIAGNOSIS — M5126 Other intervertebral disc displacement, lumbar region: Secondary | ICD-10-CM | POA: Diagnosis not present

## 2016-08-19 DIAGNOSIS — Z23 Encounter for immunization: Secondary | ICD-10-CM

## 2016-08-19 DIAGNOSIS — K7689 Other specified diseases of liver: Secondary | ICD-10-CM

## 2016-08-19 NOTE — Telephone Encounter (Signed)
fup for referral to GYN  Call to [name],  Stated she did see GYN at [facility] where she had planned to go. This issues is resolved.  Discussed death of sister; Family is bonding supportively

## 2016-08-20 ENCOUNTER — Telehealth: Payer: Self-pay | Admitting: Internal Medicine

## 2016-08-20 NOTE — Telephone Encounter (Signed)
"  I need to reschedule my surgery.  I have had a lot of deaths in my family so I am trying to get back on track."  What date would you like to reschedule to?  "I would like to do it November 10."  That date is available.  I will get you scheduled.  I left a message for Caren Griffins at the surgical center to reschedule it.

## 2016-08-20 NOTE — Telephone Encounter (Signed)
Please follow up on x ray results from [place].

## 2016-08-20 NOTE — Telephone Encounter (Signed)
I am returning your call.  You have some changes you want to make for your surgery.  "Can I call you back?  I am on a long distance phone call."  Yes, you can call me back.

## 2016-08-22 ENCOUNTER — Encounter: Payer: Self-pay | Admitting: Internal Medicine

## 2016-08-23 NOTE — Telephone Encounter (Signed)
See my chart message

## 2016-08-23 NOTE — Telephone Encounter (Signed)
Please advise, I think she may mean [place] results.

## 2016-08-23 NOTE — Telephone Encounter (Signed)
Spoke with pt to give results.

## 2016-08-26 ENCOUNTER — Telehealth: Payer: Self-pay | Admitting: *Deleted

## 2016-08-26 NOTE — Telephone Encounter (Signed)
"  I need to reschedule my surgery again.  I did not realize that was a [employer] when I scheduled.  My husband is a English as a second language teacher.  So, can we schedule it for the following week?"  November 17 is available.  I will get it rescheduled.  "Thank you."  I called and left a message with Caren Griffins at [facility] to reschedule surgery from 10/15/2016 to 10/22/2016.

## 2016-08-31 ENCOUNTER — Encounter: Payer: Self-pay | Admitting: Family

## 2016-08-31 ENCOUNTER — Ambulatory Visit (INDEPENDENT_AMBULATORY_CARE_PROVIDER_SITE_OTHER): Payer: Medicare Other | Admitting: Family

## 2016-08-31 ENCOUNTER — Ambulatory Visit (INDEPENDENT_AMBULATORY_CARE_PROVIDER_SITE_OTHER)
Admission: RE | Admit: 2016-08-31 | Discharge: 2016-08-31 | Disposition: A | Discharge status: Home / Self Care | Payer: Medicare Other | Source: Ambulatory Visit | Attending: Family | Admitting: Family

## 2016-08-31 ENCOUNTER — Ambulatory Visit (INDEPENDENT_AMBULATORY_CARE_PROVIDER_SITE_OTHER)
Admission: RE | Admit: 2016-08-31 | Discharge: 2016-08-31 | Disposition: A | Discharge status: Home / Self Care | Payer: Medicare Other | Source: Ambulatory Visit | Attending: Internal Medicine | Admitting: Internal Medicine

## 2016-08-31 VITALS — BP 128/82 | HR 85 | Temp 97.8°F | Resp 18 | Ht 64.25 in | Wt 249.0 lb

## 2016-08-31 DIAGNOSIS — N959 Unspecified menopausal and perimenopausal disorder: Secondary | ICD-10-CM | POA: Diagnosis not present

## 2016-08-31 DIAGNOSIS — S299XXA Unspecified injury of thorax, initial encounter: Secondary | ICD-10-CM | POA: Diagnosis not present

## 2016-08-31 DIAGNOSIS — W19XXXA Unspecified fall, initial encounter: Secondary | ICD-10-CM | POA: Diagnosis not present

## 2016-08-31 DIAGNOSIS — T148 Other injury of unspecified body region: Secondary | ICD-10-CM

## 2016-08-31 DIAGNOSIS — R0781 Pleurodynia: Secondary | ICD-10-CM | POA: Diagnosis not present

## 2016-08-31 DIAGNOSIS — T07XXXA Unspecified multiple injuries, initial encounter: Secondary | ICD-10-CM

## 2016-08-31 DIAGNOSIS — R0789 Other chest pain: Secondary | ICD-10-CM | POA: Diagnosis not present

## 2016-08-31 NOTE — Assessment & Plan Note (Signed)
Symptoms and exam consistent with multiple contusions of the left forearm/thumb/thigh. Treat conservatively with over-the-counter medications as needed for symptom relief and supportive care. Recommend ice and home exercise therapy. Follow-up if symptoms worsen or do not improve.

## 2016-08-31 NOTE — Patient Instructions (Signed)
Thank you for choosing [facility].  SUMMARY AND INSTRUCTIONS:  Ice to your wrist, forearm, thigh and ribs x 20 minutes every 2 hours and before bed.   Tylenol as needed for discomfort.   Medication:  Please continue to take your medications as prescribed.   Your prescription(s) have been submitted to your pharmacy or been printed and provided for you. Please take as directed and contact our office if you believe you are having problem(s) with the medication(s) or have any questions.  Imaging / Radiology:  Please stop by radiology on the basement level of the building for your x-rays. Your results will be released to [place] (or called to you) after review, usually within 72 hours after test completion. If any treatments or changes are necessary, you will be notified at that same time.  Follow up:  If your symptoms worsen or fail to improve, please contact our office for further instruction, or in case of emergency go directly to the emergency room at the closest medical facility.    Contusion A contusion is a deep bruise. Contusions are the result of a blunt injury to tissues and muscle fibers under the skin. The injury causes bleeding under the skin. The skin overlying the contusion may turn blue, purple, or yellow. Minor injuries will give you a painless contusion, but more severe contusions may stay painful and swollen for a few weeks.  CAUSES  This condition is usually caused by a blow, trauma, or direct force to an area of the body. SYMPTOMS  Symptoms of this condition include:  Swelling of the injured area.  Pain and tenderness in the injured area.  Discoloration. The area may have redness and then turn blue, purple, or yellow. DIAGNOSIS  This condition is diagnosed based on a physical exam and medical history. An X-ray, CT scan, or MRI may be needed to determine if there are any associated injuries, such as broken bones (fractures). TREATMENT  Specific treatment  for this condition depends on what area of the body was injured. In general, the best treatment for a contusion is resting, icing, applying pressure to (compression), and elevating the injured area. This is often called the RICE strategy. Over-the-counter anti-inflammatory medicines may also be recommended for pain control.  HOME CARE INSTRUCTIONS   Rest the injured area.  If directed, apply ice to the injured area:  Put ice in a plastic bag.  Place a towel between your skin and the bag.  Leave the ice on for 20 minutes, 2-3 times per day.  If directed, apply light compression to the injured area using an elastic bandage. Make sure the bandage is not wrapped too tightly. Remove and reapply the bandage as directed by your health care provider.  If possible, raise (elevate) the injured area above the level of your heart while you are sitting or lying down.  Take over-the-counter and prescription medicines only as told by your health care provider. SEEK MEDICAL CARE IF:  Your symptoms do not improve after several days of treatment.  Your symptoms get worse.  You have difficulty moving the injured area. SEEK IMMEDIATE MEDICAL CARE IF:   You have severe pain.  You have numbness in a hand or foot.  Your hand or foot turns pale or cold.   This information is not intended to replace advice given to you by your health care provider. Make sure you discuss any questions you have with your health care provider.   Document Released: 09/01/2005 Document Revised: 08/13/2015  Document Reviewed: 04/09/2015 Elsevier Interactive Patient Education [employer].

## 2016-08-31 NOTE — Progress Notes (Signed)
Subjective:    Patient ID: Cynthia Kirk, female    DOB: Sep 26, 1951, 65 y.o.   MRN: [id number]  Chief Complaint  Patient presents with  . Fall    had a fall saturday night, lost balance on steps, body hurts mainly on left side,     HPI:  Cynthia Kirk is a 65 y.o. female who  has a past medical history of Abnormal Pap smear (2008); Adenomatous colon polyp; Allergic rhinitis, cause unspecified; Anxiety; ASCUS with positive high risk HPV; Blood transfusion (1976); Chronic back pain; Ejection fraction; Endometrial mass (2011); Esophageal reflux; Fibroid; H/O Clostridium difficile infection (03/2010); H/O menorrhagia (2011); Herpes simplex type 2 infection complicating pregnancy; Hiatal hernia; Iron deficiency anemia; Morbid obesity ([place]) (2010); Obstructive sleep apnea (adult) (pediatric); Panic attacks; Pneumonia (2005; 2007; 2009; 2011); Sarcoidosis ([place]) (1996); Simple endometrial hyperplasia (2011); Spinal stenosis, lumbar; Syncope; Type 2 diabetes, diet controlled ([place]); Unspecified asthma(493.90); Urinary incontinence, mixed (2011); and VIN II (vulvar intraepithelial neoplasia II). and presents today for an office visit.   This is a new problem. Associated symptom of pain located on her left side and on occasion her whole body aches following a fall when losing her balance on steps. When she fell she landing on her left side from about a 2 step drop onto a cement floor. She was wearing a back brace at the times. Denies any sounds/sensations heard or felt. Pain is located on left arm, hand, thigh, and ribs. Modifying factors include Aleve and left-over Percocet which did not help very much. Course of the symptoms have felt like they are getting worse with the timing of the symptoms being worse at night.    Allergies  Allergen Reactions  . Aspirin     REACTION: hives  . Ibuprofen     Upsets stomach  . Latex     REACTION: rash      Outpatient Medications Prior to Visit  Medication Sig  Dispense Refill  . albuterol (PROVENTIL HFA;VENTOLIN HFA) 108 (90 BASE) MCG/ACT inhaler Inhale 2 puffs into the lungs every 6 (six) hours as needed for wheezing or shortness of breath.    [name] albuterol (PROVENTIL) (2.5 MG/3ML) 0.083% nebulizer solution Take 3 mLs (2.5 mg total) by nebulization every 6 (six) hours as needed for shortness of breath. 75 mL 5  . ALPRAZolam (XANAX) 1 MG tablet Take 1 tablet (1 mg total) by mouth 2 (two) times daily as needed. 180 tablet 0  . Blood Glucose Monitoring Suppl (ONE TOUCH ULTRA SYSTEM KIT) w/Device KIT Test blood sugars daily. DX E11.9 1 each 0  . clotrimazole-betamethasone (LOTRISONE) cream Apply topically 2 (two) times daily. 30 g 3  . Coenzyme Q10 (COQ10) 200 MG CAPS Take 1 capsule by mouth daily.    [name] dexlansoprazole (DEXILANT) 60 MG capsule Take 1 capsule (60 mg total) by mouth daily. 90 capsule 3  . ESTRACE VAGINAL 0.1 MG/GM vaginal cream     . gabapentin (NEURONTIN) 100 MG capsule Take 1 capsule (100 mg total) by mouth at bedtime. 30 capsule 3  . glucose blood (ONE TOUCH ULTRA TEST) test strip Use to check blood sugars twice a day Dx: E11.9 200 each 3  . hydrocortisone (PROCTOSOL HC) 2.5 % rectal cream APPLY RECTALLY 2 (TWO) TIMES DAILY. 28.35 g 2  . hydroquinone 4 % cream Apply topically 2 (two) times daily. 28.35 g 0  . Insulin Pen Needle (PEN NEEDLES) 32G X 4 MM MISC 1 applicator by Does not apply route 3 (three) times  daily. 100 each 1  . Lancets (ONETOUCH ULTRASOFT) lancets Use to check blood sugar twice a day. Dx E11.9 200 each 3  . Liraglutide (VICTOZA) 18 MG/3ML SOPN INJECT 1.8 MG SQ DAILY 9 pen 2  . mometasone (ASMANEX 60 METERED DOSES) 220 MCG/INH inhaler INHALE 2 PUFFS INTO THE LUNGS DAILY. 1 Inhaler 6  . montelukast (SINGULAIR) 10 MG tablet TAKE 1 TABLET (10 MG TOTAL) BY MOUTH AT BEDTIME. 90 tablet 3  . nebivolol (BYSTOLIC) 5 MG tablet Take 1 tablet (5 mg total) by mouth daily. 90 tablet 3  . olmesartan-hydrochlorothiazide ([name] HCT)  40-25 MG tablet Take 1 tablet by mouth daily. 90 tablet 3  . ranitidine (ZANTAC) 300 MG tablet TAKE 1 TABLET (300 MG TOTAL) BY MOUTH AT BEDTIME. 90 tablet 3  . methocarbamol (ROBAXIN) 500 MG tablet Take 1 tablet (500 mg total) by mouth every 6 (six) hours as needed for muscle spasms. 90 tablet 2  . triamcinolone (NASACORT AQ) 55 MCG/ACT AERO nasal inhaler Place 2 sprays into the nose daily. 1 Inhaler 12   No facility-administered medications prior to visit.       Past Surgical History:  Procedure Laterality Date  . COLPOSCOPY VULVA W/ BIOPSY     "I've had maybe 3" (09/05/2013)  . DILATION AND CURETTAGE OF UTERUS  2007  . [place]; ~ 1978  . HYSTEROSCOPY DIAGNOSTIC  2011  . LUMBAR LAMINECTOMY/DECOMPRESSION MICRODISCECTOMY N/A 09/05/2013   Procedure: Thoracic eleven-twelve Thoracic Laminectomy;  Surgeon: Eustace Moore, MD;  Location: [place] [facility];  Service: Neurosurgery;  Laterality: N/A;  Thoracic eleven-twelve Thoracic Laminectomy  . MAXIMUM ACCESS (MAS)POSTERIOR LUMBAR INTERBODY FUSION (PLIF) 1 LEVEL N/A 09/05/2013   Procedure: Lumbar four-five Maximum Access Surgery  Posterior lumbar interbody fusion;  Surgeon: Eustace Moore, MD;  Location: [place] [facility];  Service: Neurosurgery;  Laterality: N/A;  Lumbar four-five Maximum Access Surgery  Posterior lumbar interbody fusion  . POSTERIOR LAMINECTOMY / DECOMPRESSION LUMBAR SPINE  09/05/2013  . POSTERIOR LUMBAR FUSION  09/05/2013  . THORACIC LAMINECTOMY  09/05/2013  . URETHRAL SLING  2011  . VULVECTOMY  ~ 2007   "for cancerous cells" (09/05/2013)      Past Medical History:  Diagnosis Date  . Abnormal Pap smear 2008  . Adenomatous colon polyp   . Allergic rhinitis, cause unspecified   . Anxiety   . ASCUS with positive high risk HPV   . Blood transfusion 1976   Due to ectopic pregnancy  . Chronic back pain   . Ejection fraction    EF 45-50%, echo, October 27, 2011  . Endometrial mass 2011  . Esophageal reflux     . Fibroid   . H/O Clostridium difficile infection 03/2010  . H/O menorrhagia 2011  . Herpes simplex type 2 infection complicating pregnancy   . Hiatal hernia   . Iron deficiency anemia   . Morbid obesity ([place]) 2010  . Obstructive sleep apnea (adult) (pediatric)    cpap  . Panic attacks   . Pneumonia 2005; 2007; 2009; 2011  . Sarcoidosis [facility]) 1996   @ [place]  . Simple endometrial hyperplasia 2011  . Spinal stenosis, lumbar    s/p decompression laminectomy 09/2013  . Syncope    ?? Syncope ??  . Type 2 diabetes, diet controlled ([place])   . Unspecified asthma(493.90)   . Urinary incontinence, mixed 2011  . VIN II (vulvar intraepithelial neoplasia II)       Review of Systems  Constitutional: Negative  for chills and fever.  Respiratory: Negative for chest tightness and shortness of breath.   Musculoskeletal: Positive for arthralgias.      Objective:    BP 128/82 (BP Location: Left Arm, Patient Position: Sitting, Cuff Size: Large)   Pulse 85   Temp 97.8 F (36.6 C) (Oral)   Resp 18   Ht 5' 4.25" (1.632 m)   Wt 249 lb (112.9 kg)   LMP 02/14/2014   SpO2 99%   BMI 42.41 kg/m  Nursing note and vital signs reviewed.  Physical Exam  Constitutional: She is oriented to person, place, and time. She appears well-developed and well-nourished. No distress.  Cardiovascular: Normal rate, regular rhythm, normal heart sounds and intact distal pulses.   Pulmonary/Chest: Effort normal and breath sounds normal. She has no wheezes. She has no rales. She exhibits tenderness.  Chest wall - no obvious deformity, discoloration, or edema. Mild tenderness elicited over the left middle ribs with no crepitus or deformity appreciated. Positive compression test.  Musculoskeletal:  Left thumb - no obvious deformity or discoloration with mild edema. Tenderness elicited along CMC joint with normal range of motion and strength. No crepitus or deformity noted. Capillary refill intact and appropriate Left  forearm - mild discoloration with light purple/blue located on the lateral middle one third of the forearm with mild tenderness and no crepitus or deformity. Wrist and elbow range of motion within normal limits. Distal pulses and sensation are intact and appropriate. Left thigh - no obvious deformity, discoloration, with mild edema. There is mild tenderness with no deformity or crepitus noted. Hip range of motion within normal limits. Distal pulses and sensation are intact and appropriate   Neurological: She is alert and oriented to person, place, and time.  Skin: Skin is warm and dry.  Psychiatric: She has a normal mood and affect. Her behavior is normal. Judgment and thought content normal.       Assessment & Plan:   Problem List Items Addressed This Visit      Other   Left-sided chest wall pain    Left chest wall pain with concern for fracture although likely contusion. Obtain x-ray to rule out fracture. Recommend conservative treatment with ice and home exercise therapy. Continue to work on deep breathing and coughing. Follow-up if symptoms worsen or do not improve.      Relevant Orders   DG Ribs Unilateral Left (Completed)   Multiple contusions    Symptoms and exam consistent with multiple contusions of the left forearm/thumb/thigh. Treat conservatively with over-the-counter medications as needed for symptom relief and supportive care. Recommend ice and home exercise therapy. Follow-up if symptoms worsen or do not improve.       Other Visit Diagnoses   None.      I have discontinued Ms. Breidenbach's triamcinolone and methocarbamol. I am also having her maintain her albuterol, albuterol, glucose blood, onetouch ultrasoft, ONE TOUCH ULTRA SYSTEM KIT, mometasone, ranitidine, dexlansoprazole, CoQ10, Pen Needles, ALPRAZolam, ESTRACE VAGINAL, gabapentin, clotrimazole-betamethasone, hydroquinone, hydrocortisone, montelukast, olmesartan-hydrochlorothiazide, nebivolol, and Liraglutide.   No  orders of the defined types were placed in this encounter.    Follow-up: No Follow-up on file.  Mauricio Po, FNP

## 2016-08-31 NOTE — Assessment & Plan Note (Signed)
Left chest wall pain with concern for fracture although likely contusion. Obtain x-ray to rule out fracture. Recommend conservative treatment with ice and home exercise therapy. Continue to work on deep breathing and coughing. Follow-up if symptoms worsen or do not improve.

## 2016-09-01 ENCOUNTER — Other Ambulatory Visit: Payer: Self-pay | Admitting: Emergency Medicine

## 2016-09-01 MED ORDER — METHOCARBAMOL 500 MG PO TABS: 500.0000 mg | ORAL_TABLET | Freq: Four times a day (QID) | ORAL | 0 refills | Status: DC | PRN

## 2016-09-01 NOTE — Telephone Encounter (Signed)
Spoke with pt, states that after her fall she needed the Methocarbamol refilled, Earnstine Regal, NP saw pt on 08/31/16. Agreed to refill.

## 2016-09-02 ENCOUNTER — Encounter: Payer: Self-pay | Admitting: Internal Medicine

## 2016-09-03 DIAGNOSIS — M25561 Pain in right knee: Secondary | ICD-10-CM | POA: Diagnosis not present

## 2016-09-03 DIAGNOSIS — M25562 Pain in left knee: Secondary | ICD-10-CM | POA: Diagnosis not present

## 2016-09-13 DIAGNOSIS — R350 Frequency of micturition: Secondary | ICD-10-CM | POA: Diagnosis not present

## 2016-09-13 DIAGNOSIS — R3914 Feeling of incomplete bladder emptying: Secondary | ICD-10-CM | POA: Diagnosis not present

## 2016-09-15 ENCOUNTER — Other Ambulatory Visit: Payer: Self-pay | Admitting: Emergency Medicine

## 2016-09-15 MED ORDER — ONETOUCH DELICA LANCETS 33G MISC
5 refills | Status: DC
Start: 2016-09-15 — End: 2016-09-15

## 2016-09-15 MED ORDER — ONETOUCH DELICA LANCETS 33G MISC: 3 refills | Status: DC

## 2016-09-23 ENCOUNTER — Telehealth: Payer: Self-pay | Admitting: *Deleted

## 2016-09-23 MED ORDER — ALPRAZOLAM 1 MG PO TABS: 1.0000 mg | ORAL_TABLET | Freq: Two times a day (BID) | ORAL | 0 refills | Status: DC | PRN

## 2016-09-23 NOTE — Telephone Encounter (Signed)
Left msg on triage requesting refill on her Alprazolam to be sent to [facility],,,/lmb

## 2016-09-23 NOTE — Telephone Encounter (Signed)
Called refill into [facility] spoke w/pharmacist gave md approval notified pt refill has been called to mail service...Cynthia Kirk

## 2016-09-23 NOTE — Telephone Encounter (Signed)
[place] controlled substance database checked.  Ok to fill medication.  Last fill was 06/21/16

## 2016-10-09 ENCOUNTER — Other Ambulatory Visit: Payer: Self-pay | Admitting: Podiatry

## 2016-10-11 NOTE — Telephone Encounter (Signed)
Pt will need an appt prior to future refills. 

## 2016-10-15 ENCOUNTER — Telehealth: Payer: Self-pay | Admitting: *Deleted

## 2016-10-15 ENCOUNTER — Other Ambulatory Visit: Payer: Self-pay | Admitting: *Deleted

## 2016-10-15 DIAGNOSIS — Z1231 Encounter for screening mammogram for malignant neoplasm of breast: Secondary | ICD-10-CM

## 2016-10-15 NOTE — Telephone Encounter (Signed)
I am calling to see if you got medical clearance from your Cardiologist.  Dr. Quay Burow had said you needed to get Cardiac clearance prior to you having surgery. "No, I have not.  I don't know why she wants me to get clearance."  She stated it was due to stress you were experiencing.  "Oh yeah, I was really stressed over the summer.  My baby sister died of a heart attack and my brother died in a car accident on my mother's birthday.  My mother had died from a car accident previously.  I'll have to arrange that appointment.  Do I need to call the surgery center to cancel or is that something you will take care of?"  I will call and cancel surgery.  "I'll reschedule after the Holidays."  I called and canceled surgery at the surgical center.

## 2016-10-22 ENCOUNTER — Other Ambulatory Visit: Payer: Self-pay

## 2016-10-25 ENCOUNTER — Ambulatory Visit: Payer: Medicare Other | Admitting: Physician Assistant

## 2016-10-26 ENCOUNTER — Other Ambulatory Visit: Payer: Medicare Other

## 2016-11-04 ENCOUNTER — Other Ambulatory Visit: Payer: Self-pay

## 2016-11-23 ENCOUNTER — Telehealth: Payer: Self-pay | Admitting: *Deleted

## 2016-11-23 ENCOUNTER — Ambulatory Visit: Payer: Medicare Other

## 2016-11-23 NOTE — Telephone Encounter (Signed)
She needs to be seen - can be seen by someone here this week or go to urgent care.

## 2016-11-23 NOTE — Telephone Encounter (Signed)
Pt left msg on triage stating she have issues w/ back pain, and sometimes the pain radiates down her leg. She had tried to reach out to her neurosurgeon to see if he can rx a pain med for her, but have not been successful . Requesting Dr. Quay Burow to rx a pain med for sxs because she will be traveling for xmas holidays, and needing to get something for pain....Johny Chess

## 2016-11-24 ENCOUNTER — Encounter: Payer: Self-pay | Admitting: Internal Medicine

## 2016-11-24 NOTE — Telephone Encounter (Signed)
Replied back to pt through email she sent MD w/response...Cynthia Kirk

## 2016-11-26 ENCOUNTER — Encounter: Payer: Self-pay | Admitting: Family

## 2016-11-26 ENCOUNTER — Ambulatory Visit (INDEPENDENT_AMBULATORY_CARE_PROVIDER_SITE_OTHER): Payer: Medicare Other | Admitting: Family

## 2016-11-26 VITALS — BP 116/74 | HR 78 | Temp 97.6°F | Resp 16 | Ht 64.25 in | Wt 237.0 lb

## 2016-11-26 DIAGNOSIS — M48061 Spinal stenosis, lumbar region without neurogenic claudication: Secondary | ICD-10-CM

## 2016-11-26 DIAGNOSIS — M5010 Cervical disc disorder with radiculopathy, unspecified cervical region: Secondary | ICD-10-CM | POA: Diagnosis not present

## 2016-11-26 MED ORDER — OXYCODONE HCL ER 10 MG PO T12A: 10.0000 mg | EXTENDED_RELEASE_TABLET | Freq: Two times a day (BID) | ORAL | 0 refills | Status: DC

## 2016-11-26 MED ORDER — CYCLOBENZAPRINE HCL 10 MG PO TABS: 5.0000 mg | ORAL_TABLET | Freq: Three times a day (TID) | ORAL | 0 refills | Status: DC | PRN

## 2016-11-26 MED ORDER — CYCLOBENZAPRINE HCL 10 MG PO TABS
5.0000 mg | ORAL_TABLET | Freq: Three times a day (TID) | ORAL | 0 refills | Status: DC | PRN
Start: 2016-11-26 — End: 2016-12-23

## 2016-11-26 NOTE — Patient Instructions (Addendum)
Thank you for choosing [facility].  SUMMARY AND INSTRUCTIONS:  Please continue to take your medications as prescribed.   Follow up with Dr. Quay Burow / Dr. Ronnald Ramp for further pain management.   Medication:  Your prescription(s) have been submitted to your pharmacy or been printed and provided for you. Please take as directed and contact our office if you believe you are having problem(s) with the medication(s) or have any questions.  Follow up:  If your symptoms worsen or fail to improve, please contact our office for further instruction, or in case of emergency go directly to the emergency room at the closest medical facility.

## 2016-11-26 NOTE — Progress Notes (Signed)
Subjective:    Patient ID: Cynthia Kirk, female    DOB: 1951-08-29, 65 y.o.   MRN: [id number]  Chief Complaint  Patient presents with  . Back Pain    back pain and she stated that she needed to come in for pain meds    HPI:  Cynthia Kirk is a 65 y.o. female who  has a past medical history of Abnormal Pap smear (2008); Adenomatous colon polyp; Allergic rhinitis, cause unspecified; Anxiety; ASCUS with positive high risk HPV; Blood transfusion (1976); Chronic back pain; Ejection fraction; Endometrial mass (2011); Esophageal reflux; Fibroid; H/O Clostridium difficile infection (03/2010); H/O menorrhagia (2011); Herpes simplex type 2 infection complicating pregnancy; Hiatal hernia; Iron deficiency anemia; Morbid obesity ([place]) (2010); Obstructive sleep apnea (adult) (pediatric); Panic attacks; Pneumonia (2005; 2007; 2009; 2011); Sarcoidosis ([place]) (1996); Simple endometrial hyperplasia (2011); Spinal stenosis, lumbar; Syncope; Type 2 diabetes, diet controlled ([place]); Unspecified asthma(493.90); Urinary incontinence, mixed (2011); and VIN II (vulvar intraepithelial neoplasia II). and presents today for an office visit.    Previously evaluated in the office for cervical disc disorder with radiculopathy. Previous MRI on 03/26/16 showed multifactorial degenerative spinal stenosis with mild spinal cord and mass effect at C5-C6 and C6-C7. Noted to also have severe bilateral neuroforaminal stenosis at both of those levels and also at the left C4 and left greater than right C5 nerve levels. Currently maintained on ibuprofen, Tylenol and Aleve which have not helped very much. Not currently taking the Robaxin. Indicates that Robaxin needs to be changed as insurance will not cover. Reports taking medication as prescribed and denies adverse side effects. Continues to experience the associated symptoms of pain located in her lumbar spine that she indicates radiates to her cervical spine and to her shoulder. Denies any  numbness or tingling. Severity of the pain is an 8.5/10. Indicates that she has taken multiple pain medications in the past and found the only that has worked for her is oxycodone extended release. She notes the hydrocodone and percocet have not helped.      Allergies  Allergen Reactions  . Aspirin     REACTION: hives  . Ibuprofen     Upsets stomach  . Latex     REACTION: rash      Outpatient Medications Prior to Visit  Medication Sig Dispense Refill  . albuterol (PROVENTIL HFA;VENTOLIN HFA) 108 (90 BASE) MCG/ACT inhaler Inhale 2 puffs into the lungs every 6 (six) hours as needed for wheezing or shortness of breath.    [name] albuterol (PROVENTIL) (2.5 MG/3ML) 0.083% nebulizer solution Take 3 mLs (2.5 mg total) by nebulization every 6 (six) hours as needed for shortness of breath. 75 mL 5  . ALPRAZolam (XANAX) 1 MG tablet Take 1 tablet (1 mg total) by mouth 2 (two) times daily as needed. 180 tablet 0  . Blood Glucose Monitoring Suppl (ONE TOUCH ULTRA SYSTEM KIT) w/Device KIT Test blood sugars daily. DX E11.9 1 each 0  . clotrimazole-betamethasone (LOTRISONE) cream Apply topically 2 (two) times daily. 30 g 3  . Coenzyme Q10 (COQ10) 200 MG CAPS Take 1 capsule by mouth daily.    [name] dexlansoprazole (DEXILANT) 60 MG capsule Take 1 capsule (60 mg total) by mouth daily. 90 capsule 3  . ESTRACE VAGINAL 0.1 MG/GM vaginal cream     . gabapentin (NEURONTIN) 100 MG capsule TAKE 1 CAPSULE AT BEDTIME 30 capsule 7  . glucose blood (ONE TOUCH ULTRA TEST) test strip Use to check blood sugars twice a day Dx: E11.9 200  each 3  . hydrocortisone (PROCTOSOL HC) 2.5 % rectal cream APPLY RECTALLY 2 (TWO) TIMES DAILY. 28.35 g 2  . hydroquinone 4 % cream Apply topically 2 (two) times daily. 28.35 g 0  . Insulin Pen Needle (PEN NEEDLES) 32G X 4 MM MISC 1 applicator by Does not apply route 3 (three) times daily. 100 each 1  . Lancets (ONETOUCH ULTRASOFT) lancets Use to check blood sugar twice a day. Dx E11.9 200 each  3  . Liraglutide (VICTOZA) 18 MG/3ML SOPN INJECT 1.8 MG SQ DAILY 9 pen 2  . methocarbamol (ROBAXIN) 500 MG tablet Take 1 tablet (500 mg total) by mouth every 6 (six) hours as needed for muscle spasms. 30 tablet 0  . mometasone (ASMANEX 60 METERED DOSES) 220 MCG/INH inhaler INHALE 2 PUFFS INTO THE LUNGS DAILY. 1 Inhaler 6  . montelukast (SINGULAIR) 10 MG tablet TAKE 1 TABLET (10 MG TOTAL) BY MOUTH AT BEDTIME. 90 tablet 3  . nebivolol (BYSTOLIC) 5 MG tablet Take 1 tablet (5 mg total) by mouth daily. 90 tablet 3  . olmesartan-hydrochlorothiazide ([name] HCT) 40-25 MG tablet Take 1 tablet by mouth daily. 90 tablet 3  . ONETOUCH DELICA LANCETS 02I MISC Use to test blood sugars twice daily. 200 each 3  . ranitidine (ZANTAC) 300 MG tablet TAKE 1 TABLET (300 MG TOTAL) BY MOUTH AT BEDTIME. 90 tablet 3   No facility-administered medications prior to visit.       Review of Systems  Constitutional: Negative for chills and fever.  Musculoskeletal: Positive for back pain, neck pain and neck stiffness.  Neurological: Negative for weakness and numbness.      Objective:    BP 116/74 (BP Location: Left Arm, Patient Position: Sitting, Cuff Size: Large)   Pulse 78   Temp 97.6 F (36.4 C) (Oral)   Resp 16   Ht 5' 4.25" (1.632 m)   Wt 237 lb (107.5 kg)   LMP 02/14/2014   SpO2 98%   BMI 40.36 kg/m  Nursing note and vital signs reviewed.  Physical Exam  Constitutional: She is oriented to person, place, and time. She appears well-developed and well-nourished. No distress.  Seated in the chair with back brace in place playing on games on her cellular device during interview.  Cardiovascular: Normal rate, regular rhythm, normal heart sounds and intact distal pulses.   Pulmonary/Chest: Effort normal and breath sounds normal.  Musculoskeletal:  No obvious deformity, discoloration or edema. There is exquisite tenderness located over the cervical spine and paraspinal musculature. There is low back  tenderness in the midline and right side lumbar spine. Range of motion testing deffered.   Neurological: She is alert and oriented to person, place, and time.  Skin: Skin is warm and dry.  Psychiatric: She has a normal mood and affect. Her behavior is normal. Judgment and thought content normal.       Assessment & Plan:   Problem List Items Addressed This Visit      Musculoskeletal and Integument   Cervical disc disorder with radiculopathy of cervical region - Primary    Continues to experience cervical pain with radiculopathy towards the left shoulder. Limited range of motion and very sensitive to touch. Restart oxycodone as previous pain medication management has not helped significantly. Discontinue Robaxin. Start cyclobenzaprine. Encouraged nonpharmacological therapy including ice/moist heat and home exercise therapy. Will most likely require long-term pain management. Follow-up with neurosurgery and PCP for additional pain management. [place] controlled substance database reviewed with no irregularities.  Relevant Medications   cyclobenzaprine (FLEXERIL) 10 MG tablet     Other   Lumbar spinal stenosis    Lumbar spinal stenosis status post lumbar surgeries. Currently maintained with her brace. The most likely require pain management referral with pain management discussed in her cervical pain. Continue to monitor.      Relevant Medications   cyclobenzaprine (FLEXERIL) 10 MG tablet       I am having Ms. Lenzen start on oxyCODONE. I am also having her maintain her albuterol, albuterol, glucose blood, onetouch ultrasoft, ONE TOUCH ULTRA SYSTEM KIT, mometasone, ranitidine, dexlansoprazole, CoQ10, Pen Needles, ESTRACE VAGINAL, clotrimazole-betamethasone, hydroquinone, hydrocortisone, montelukast, olmesartan-hydrochlorothiazide, nebivolol, liraglutide, methocarbamol, ONETOUCH DELICA LANCETS 15X, ALPRAZolam, gabapentin, and cyclobenzaprine.   Meds ordered this encounter    Medications  . oxyCODONE (OXYCONTIN) 10 mg 12 hr tablet    Sig: Take 1 tablet (10 mg total) by mouth every 12 (twelve) hours.    Dispense:  28 tablet    Refill:  0    Order Specific Question:   Supervising Provider    Answer:   Pricilla Holm A [[id number]]  . DISCONTD: cyclobenzaprine (FLEXERIL) 10 MG tablet    Sig: Take 0.5-1 tablets (5-10 mg total) by mouth 3 (three) times daily as needed for muscle spasms.    Dispense:  90 tablet    Refill:  0    Order Specific Question:   Supervising Provider    Answer:   Pricilla Holm A [[id number]]  . cyclobenzaprine (FLEXERIL) 10 MG tablet    Sig: Take 0.5-1 tablets (5-10 mg total) by mouth 3 (three) times daily as needed for muscle spasms.    Dispense:  90 tablet    Refill:  0    Order Specific Question:   Supervising Provider    Answer:   Pricilla Holm A [[id number]]     Follow-up: Return if symptoms worsen or fail to improve.  Mauricio Po, FNP

## 2016-11-26 NOTE — Assessment & Plan Note (Signed)
Continues to experience cervical pain with radiculopathy towards the left shoulder. Limited range of motion and very sensitive to touch. Restart oxycodone as previous pain medication management has not helped significantly. Discontinue Robaxin. Start cyclobenzaprine. Encouraged nonpharmacological therapy including ice/moist heat and home exercise therapy. Will most likely require long-term pain management. Follow-up with neurosurgery and PCP for additional pain management. [place] controlled substance database reviewed with no irregularities.

## 2016-11-26 NOTE — Assessment & Plan Note (Signed)
Lumbar spinal stenosis status post lumbar surgeries. Currently maintained with her brace. The most likely require pain management referral with pain management discussed in her cervical pain. Continue to monitor.

## 2016-12-13 ENCOUNTER — Ambulatory Visit: Payer: Medicare Other | Admitting: Internal Medicine

## 2016-12-23 ENCOUNTER — Other Ambulatory Visit: Payer: Self-pay | Admitting: Internal Medicine

## 2016-12-23 ENCOUNTER — Other Ambulatory Visit: Payer: Self-pay | Admitting: Family

## 2016-12-23 DIAGNOSIS — M5010 Cervical disc disorder with radiculopathy, unspecified cervical region: Secondary | ICD-10-CM

## 2016-12-23 DIAGNOSIS — M48061 Spinal stenosis, lumbar region without neurogenic claudication: Secondary | ICD-10-CM

## 2016-12-24 ENCOUNTER — Other Ambulatory Visit: Payer: Self-pay | Admitting: Emergency Medicine

## 2016-12-24 MED ORDER — ALPRAZOLAM 1 MG PO TABS: 1.0000 mg | ORAL_TABLET | Freq: Two times a day (BID) | ORAL | 0 refills | Status: DC | PRN

## 2016-12-24 MED ORDER — HYDROQUINONE 4 % EX CREA: TOPICAL_CREAM | Freq: Two times a day (BID) | CUTANEOUS | 0 refills | Status: DC

## 2016-12-24 NOTE — Telephone Encounter (Signed)
[place] controlled substance database checked.  Ok to fill medication.  

## 2016-12-24 NOTE — Telephone Encounter (Signed)
RX request, per mychart have been faxed.

## 2016-12-29 ENCOUNTER — Ambulatory Visit: Payer: Medicare Other | Admitting: Cardiovascular Disease

## 2016-12-30 ENCOUNTER — Other Ambulatory Visit: Payer: Self-pay | Admitting: Internal Medicine

## 2017-01-03 ENCOUNTER — Encounter: Payer: Self-pay | Admitting: Internal Medicine

## 2017-01-03 ENCOUNTER — Ambulatory Visit (INDEPENDENT_AMBULATORY_CARE_PROVIDER_SITE_OTHER): Payer: Medicare Other | Admitting: Internal Medicine

## 2017-01-03 VITALS — BP 114/72 | HR 86 | Ht 64.25 in | Wt 240.2 lb

## 2017-01-03 DIAGNOSIS — R0602 Shortness of breath: Secondary | ICD-10-CM

## 2017-01-03 NOTE — Patient Instructions (Signed)

## 2017-01-03 NOTE — Progress Notes (Signed)
Cardiology Office Note   Date:  01/03/2017   ID:  Cynthia Kirk, DOB [birth year], MRN [id number]  PCP:  [name], MD  Cardiologist:   [name], MD   Pt referred for cardiac risk assessment     History of Present Illness Cynthia Kirk is a 66 y.o. female with no known CAD   She had a sister who had an MI  Worried about her risk  Saw Cynthia Kirk in the past  Hx of DM and GERD and HTN    Also a hsitory of sarcoid and laughing syncope   FHx of  Sister with kidney issues  Sister Had blood clot to lung Rx coumadin  Admitted severl times  Died  This past year Son died of sudden death  Heart enlarged  He was paraplegic atter GSW Fathers side of family  History is not clear Mothers side GF with MI at 54  No PND  No dizziness If does too much Gets SOB  Low back pain   No CP   Will have crying spells  Gasping These have occurred since son died.          Current Meds  Medication Sig  . albuterol (PROVENTIL HFA;VENTOLIN HFA) 108 (90 BASE) MCG/ACT inhaler Inhale 2 puffs into the lungs every 6 (six) hours as needed for wheezing or shortness of breath.  [name] albuterol (PROVENTIL) (2.5 MG/3ML) 0.083% nebulizer solution Take 3 mLs (2.5 mg total) by nebulization every 6 (six) hours as needed for shortness of breath.  . ALPRAZolam (XANAX) 1 MG tablet Take 1 tablet (1 mg total) by mouth 2 (two) times daily as needed.  . BD PEN NEEDLE NANO U/F 32G X 4 MM MISC USE 3 TIMES DAILY AS DIRECTED  . Blood Glucose Monitoring Suppl (ONE TOUCH ULTRA SYSTEM KIT) w/Device KIT Test blood sugars daily. DX E11.9  . clotrimazole-betamethasone (LOTRISONE) cream Apply topically 2 (two) times daily.  . Coenzyme Q10 (COQ10) 200 MG CAPS Take 1 capsule by mouth daily.  . cyclobenzaprine (FLEXERIL) 10 MG tablet TAKE 0.5-1 TABLETS (5-10 MG TOTAL) BY MOUTH 3 (THREE) TIMES DAILY AS NEEDED FOR MUSCLE SPASMS.  [name] dexlansoprazole (DEXILANT) 60 MG capsule Take 1 capsule (60 mg total) by mouth daily.  [name] ESTRACE VAGINAL 0.1 MG/GM  vaginal cream   . gabapentin (NEURONTIN) 100 MG capsule TAKE 1 CAPSULE AT BEDTIME  . glucose blood (ONE TOUCH ULTRA TEST) test strip Use to check blood sugars twice a day Dx: E11.9  . hydrocortisone (PROCTOSOL HC) 2.5 % rectal cream APPLY RECTALLY 2 (TWO) TIMES DAILY.  . hydroquinone 4 % cream Apply topically 2 (two) times daily.  . Lancets (ONETOUCH ULTRASOFT) lancets Use to check blood sugar twice a day. Dx E11.9  . Liraglutide (VICTOZA) 18 MG/3ML SOPN INJECT 1.8 MG SQ DAILY  . mometasone (ASMANEX 60 METERED DOSES) 220 MCG/INH inhaler INHALE 2 PUFFS INTO THE LUNGS DAILY.  . montelukast (SINGULAIR) 10 MG tablet TAKE 1 TABLET (10 MG TOTAL) BY MOUTH AT BEDTIME.  . nebivolol (BYSTOLIC) 5 MG tablet Take 1 tablet (5 mg total) by mouth daily.  [name] olmesartan-hydrochlorothiazide ([name] HCT) 40-25 MG tablet Take 1 tablet by mouth daily.  [name] DELICA LANCETS 93Y MISC Use to test blood sugars twice daily.  [name] oxyCODONE (OXYCONTIN) 10 mg 12 hr tablet Take 1 tablet (10 mg total) by mouth every 12 (twelve) hours.  . ranitidine (ZANTAC) 300 MG tablet TAKE 1 TABLET (300 MG TOTAL) BY MOUTH AT BEDTIME.  Allergies:   Aspirin; Ibuprofen; and Latex   Past Medical History:  Diagnosis Date  . Abnormal Pap smear 2008  . Adenomatous colon polyp   . Allergic rhinitis, cause unspecified   . Anxiety   . ASCUS with positive high risk HPV   . Blood transfusion 1976   Due to ectopic pregnancy  . Chronic back pain   . Ejection fraction    EF 45-50%, echo, October 27, 2011  . Endometrial mass 2011  . Esophageal reflux   . Fibroid   . H/O Clostridium difficile infection 03/2010  . H/O menorrhagia 2011  . Herpes simplex type 2 infection complicating pregnancy   . Hiatal hernia   . Iron deficiency anemia   . Morbid obesity ([place]) 2010  . Obstructive sleep apnea (adult) (pediatric)    cpap  . Panic attacks   . Pneumonia 2005; 2007; 2009; 2011  . Sarcoidosis [facility]) 1996   @ [place]  . Simple endometrial  hyperplasia 2011  . Spinal stenosis, lumbar    s/p decompression laminectomy 09/2013  . Syncope    ?? Syncope ??  . Type 2 diabetes, diet controlled ([place])   . Unspecified asthma(493.90)   . Urinary incontinence, mixed 2011  . VIN II (vulvar intraepithelial neoplasia II)     Past Surgical History:  Procedure Laterality Date  . COLPOSCOPY VULVA W/ BIOPSY     "I've had maybe 3" (09/05/2013)  . DILATION AND CURETTAGE OF UTERUS  2007  . [place]; ~ 1978  . HYSTEROSCOPY DIAGNOSTIC  2011  . LUMBAR LAMINECTOMY/DECOMPRESSION MICRODISCECTOMY N/A 09/05/2013   Procedure: Thoracic eleven-twelve Thoracic Laminectomy;  Surgeon: Eustace Moore, MD;  Location: [place] [facility];  Service: Neurosurgery;  Laterality: N/A;  Thoracic eleven-twelve Thoracic Laminectomy  . MAXIMUM ACCESS (MAS)POSTERIOR LUMBAR INTERBODY FUSION (PLIF) 1 LEVEL N/A 09/05/2013   Procedure: Lumbar four-five Maximum Access Surgery  Posterior lumbar interbody fusion;  Surgeon: Eustace Moore, MD;  Location: [facility];  Service: Neurosurgery;  Laterality: N/A;  Lumbar four-five Maximum Access Surgery  Posterior lumbar interbody fusion  . POSTERIOR LAMINECTOMY / DECOMPRESSION LUMBAR SPINE  09/05/2013  . POSTERIOR LUMBAR FUSION  09/05/2013  . THORACIC LAMINECTOMY  09/05/2013  . URETHRAL SLING  2011  . VULVECTOMY  ~ 2007   "for cancerous cells" (09/05/2013)     Social History:  The patient  reports that she has never smoked. She has never used smokeless tobacco. She reports that she drinks alcohol. She reports that she does not use drugs.   Family History:  The patient's family history includes Asthma in her daughter and son; Cancer in her paternal uncle; Diabetes in her father and other; Heart attack in her father and sister; High Cholesterol in her brother; Hypertension in her father; Kidney disease in her other and sister; Lung cancer in her maternal uncle; Migraines in her father; Osteoporosis in her sister and sister;  Seizures in her father.    ROS:  Please see the history of present illness. All other systems are reviewed and  Negative to the above problem except as noted.    PHYSICAL EXAM: VS:  BP 114/72   Pulse 86   Ht 5' 4.25" (1.632 m)   Wt 240 lb 3.2 oz (109 kg)   LMP 02/14/2014   SpO2 99%   BMI 40.91 kg/m   GEN: Morbidlhy obese 66 yo, in no acute distress  HEENT: normal  Neck: no JVD, carotid bruits, or masses Cardiac: RRR; no  murmurs, rubs, or gallops,no edema  Respiratory:  clear to auscultation bilaterally, normal work of breathing GI: soft, nontender, nondistended, + BS  No hepatomegaly  MS: no deformity Moving all extremities   Skin: warm and dry, no rash Neuro:  Strength and sensation are intact Psych: euthymic mood, full affect   EKG:  EKG is ordered today.  March 2017 SR  Normal intervals     Lipid Panel    Component Value Date/Time   CHOL 191 07/23/2016 1156   TRIG 86.0 07/23/2016 1156   HDL 59.80 07/23/2016 1156   CHOLHDL 3 07/23/2016 1156   VLDL 17.2 07/23/2016 1156   LDLCALC 114 (H) 07/23/2016 1156      Wt Readings from Last 3 Encounters:  01/03/17 240 lb 3.2 oz (109 kg)  11/26/16 237 lb (107.5 kg)  08/31/16 249 lb (112.9 kg)      ASSESSMENT AND PLAN:  Pt a 66 yo with no documented CAD  Presets for eval I am not convinced of active ischemia   Sister did not clearly have MI  Did have PE More concerning is son who died suddenly  HOwever did have other medical issues  (paraplegic, with osteomyelitis) I would recom setting pt up for echo to eval LV systolic and diastolic funciton  Further testing will be based on results of echo  If LVEF normal, consider nuclear scan    2  hTN  BP good  3  Hx sarcoid   Followed in pulmonary clinc    Current medicines are reviewed at length with the patient today.  The patient does not have concerns regarding medicines.  Signed, [name], MD  01/03/2017 10:09 AM    [address], [place], [place]  [address] Phone: [phone]; Fax: [phone]

## 2017-01-06 DIAGNOSIS — R351 Nocturia: Secondary | ICD-10-CM | POA: Diagnosis not present

## 2017-01-06 DIAGNOSIS — R350 Frequency of micturition: Secondary | ICD-10-CM | POA: Diagnosis not present

## 2017-01-06 DIAGNOSIS — R3914 Feeling of incomplete bladder emptying: Secondary | ICD-10-CM | POA: Diagnosis not present

## 2017-01-06 DIAGNOSIS — N3946 Mixed incontinence: Secondary | ICD-10-CM | POA: Diagnosis not present

## 2017-01-17 DIAGNOSIS — N3944 Nocturnal enuresis: Secondary | ICD-10-CM | POA: Diagnosis not present

## 2017-01-17 DIAGNOSIS — N3946 Mixed incontinence: Secondary | ICD-10-CM | POA: Diagnosis not present

## 2017-01-19 ENCOUNTER — Ambulatory Visit (HOSPITAL_COMMUNITY): Payer: Medicare Other

## 2017-02-01 ENCOUNTER — Encounter: Payer: Self-pay | Admitting: Internal Medicine

## 2017-02-01 ENCOUNTER — Ambulatory Visit (INDEPENDENT_AMBULATORY_CARE_PROVIDER_SITE_OTHER): Payer: Medicare Other | Admitting: Internal Medicine

## 2017-02-01 VITALS — BP 104/70 | HR 74 | Temp 97.7°F | Resp 16 | Wt 239.0 lb

## 2017-02-01 DIAGNOSIS — F419 Anxiety disorder, unspecified: Secondary | ICD-10-CM | POA: Diagnosis not present

## 2017-02-01 DIAGNOSIS — I10 Essential (primary) hypertension: Secondary | ICD-10-CM | POA: Diagnosis not present

## 2017-02-01 DIAGNOSIS — J069 Acute upper respiratory infection, unspecified: Secondary | ICD-10-CM

## 2017-02-01 DIAGNOSIS — M25512 Pain in left shoulder: Secondary | ICD-10-CM | POA: Diagnosis not present

## 2017-02-01 DIAGNOSIS — E119 Type 2 diabetes mellitus without complications: Secondary | ICD-10-CM | POA: Diagnosis not present

## 2017-02-01 DIAGNOSIS — B9789 Other viral agents as the cause of diseases classified elsewhere: Secondary | ICD-10-CM | POA: Diagnosis not present

## 2017-02-01 DIAGNOSIS — K219 Gastro-esophageal reflux disease without esophagitis: Secondary | ICD-10-CM | POA: Diagnosis not present

## 2017-02-01 MED ORDER — HYDROCOD POLST-CPM POLST ER 10-8 MG/5ML PO SUER: 5.0000 mL | Freq: Two times a day (BID) | ORAL | 0 refills | Status: DC | PRN

## 2017-02-01 NOTE — Assessment & Plan Note (Signed)
GERD controlled Continue daily medication  

## 2017-02-01 NOTE — Assessment & Plan Note (Signed)
Well controlled Will continue victoza for sugar control and to help with weight loss a1c Increase exercise Diabetic diet

## 2017-02-01 NOTE — Patient Instructions (Addendum)
  Test(s) ordered today. Your results will be released to [facility] (or called to you) after review, usually within 72hours after test completion. If any changes need to be made, you will be notified at that same time.   Medications reviewed and updated.  No changes recommended at this time.  Your prescription(s) has been given to your for cough. Please take as directed and contact our office if you believe you are having problem(s) with the medication(s).  Call if your cold does not improve.   Please followup in 6 months

## 2017-02-01 NOTE — Assessment & Plan Note (Signed)
Likely viral tussionex Use inhalers Call if symptoms worsen

## 2017-02-01 NOTE — Assessment & Plan Note (Signed)
Associated with difficulty sleeping Related to multiple deaths in family Xanax as needed

## 2017-02-01 NOTE — Assessment & Plan Note (Signed)
BP well controlled Current regimen effective and well tolerated Continue current medications at current doses cmp  

## 2017-02-01 NOTE — Progress Notes (Signed)
Pre visit review using our clinic review tool, if applicable. No additional management support is needed unless otherwise documented below in the visit note. 

## 2017-02-01 NOTE — Assessment & Plan Note (Signed)
Started a couple of days ago  Likely related to cooking all night Ice, rest, otc pain meds Call if no improvement - can refer

## 2017-02-01 NOTE — Progress Notes (Signed)
Subjective:    Patient ID: Cynthia Kirk, female    DOB: 30-Oct-1951, 66 y.o.   MRN: [id number]  HPI She is here for follow up.   Cold symptoms:  For the past two days she has had cold symptoms, including chills, fatigue, cough, sinus pain, nausea.    Left shoulder sore:  The pain is at rest and with activity.  She was very active a couple of nights ago cooking (cooked all night) and her husband thinks that cause it.    Diabetes: She is taking her medication daily as prescribed. She is compliant with a diabetic diet. She is exercising irregularly. She monitors her sugars and they have been running low 100's. She checks her feet daily and denies foot lesions. She is up-to-date with an ophthalmology examination.   GERD:  She is taking her medication daily as prescribed.  She denies any GERD symptoms and feels her GERD is well controlled.   Hypertension: She is taking her medication daily. She is compliant with a low sodium diet.  She denies chest pain, palpitations, edema, shortness of breath and regular headaches. She is exercising regularly.  She does not monitor her blood pressure at home.    Insomnia, anxiety:  Her sone passed away last 2023-11-13.  She takes the xana as needed for anxiety/difficulty sleeping.    Medications and allergies reviewed with patient and updated if appropriate.  Patient Active Problem List   Diagnosis Date Noted  . Left-sided chest wall pain 08/31/2016  . Multiple contusions 08/31/2016  . Renal cyst 07/23/2016  . Situational depression 06/24/2016  . Cervical disc disorder with radiculopathy of cervical region 03/13/2016  . B12 deficiency   . Cataract 02/25/2014  . Glaucoma suspect 02/25/2014  . Lumbar spinal stenosis 03/07/2013  . Ejection fraction   . Syncope   . Severe obesity (BMI >= 40) ([place]) 09/14/2010  . Essential hypertension 09/14/2010  . ANEMIA-IRON DEFICIENCY 03/20/2010  . Menopausal and postmenopausal disorder 03/20/2010  . GERD  08/22/2009  . Pulmonary sarcoidosis ([place]) 06/30/2009  . Diabetes mellitus ([place]) 06/30/2009  . OSA on CPAP 06/30/2009  . Upper airway cough syndrome 06/30/2009  . Mild persistent asthma 06/30/2009    Current Outpatient Prescriptions on File Prior to Visit  Medication Sig Dispense Refill  . albuterol (PROVENTIL HFA;VENTOLIN HFA) 108 (90 BASE) MCG/ACT inhaler Inhale 2 puffs into the lungs every 6 (six) hours as needed for wheezing or shortness of breath.    [name] albuterol (PROVENTIL) (2.5 MG/3ML) 0.083% nebulizer solution Take 3 mLs (2.5 mg total) by nebulization every 6 (six) hours as needed for shortness of breath. 75 mL 5  . ALPRAZolam (XANAX) 1 MG tablet Take 1 tablet (1 mg total) by mouth 2 (two) times daily as needed. 180 tablet 0  . BD PEN NEEDLE NANO U/F 32G X 4 MM MISC USE 3 TIMES DAILY AS DIRECTED 100 each 1  . Blood Glucose Monitoring Suppl (ONE TOUCH ULTRA SYSTEM KIT) w/Device KIT Test blood sugars daily. DX E11.9 1 each 0  . clotrimazole-betamethasone (LOTRISONE) cream Apply topically 2 (two) times daily. 30 g 3  . Coenzyme Q10 (COQ10) 200 MG CAPS Take 1 capsule by mouth daily.    . cyclobenzaprine (FLEXERIL) 10 MG tablet TAKE 0.5-1 TABLETS (5-10 MG TOTAL) BY MOUTH 3 (THREE) TIMES DAILY AS NEEDED FOR MUSCLE SPASMS. 90 tablet 0  . dexlansoprazole (DEXILANT) 60 MG capsule Take 1 capsule (60 mg total) by mouth daily. 90 capsule 3  . ESTRACE VAGINAL  0.1 MG/GM vaginal cream     . gabapentin (NEURONTIN) 100 MG capsule TAKE 1 CAPSULE AT BEDTIME 30 capsule 7  . glucose blood (ONE TOUCH ULTRA TEST) test strip Use to check blood sugars twice a day Dx: E11.9 200 each 3  . hydrocortisone (PROCTOSOL HC) 2.5 % rectal cream APPLY RECTALLY 2 (TWO) TIMES DAILY. 28.35 g 2  . hydroquinone 4 % cream Apply topically 2 (two) times daily. 28.35 g 0  . Lancets (ONETOUCH ULTRASOFT) lancets Use to check blood sugar twice a day. Dx E11.9 200 each 3  . Liraglutide (VICTOZA) 18 MG/3ML SOPN INJECT 1.8 MG SQ DAILY  9 pen 2  . mometasone (ASMANEX 60 METERED DOSES) 220 MCG/INH inhaler INHALE 2 PUFFS INTO THE LUNGS DAILY. 1 Inhaler 6  . montelukast (SINGULAIR) 10 MG tablet TAKE 1 TABLET (10 MG TOTAL) BY MOUTH AT BEDTIME. 90 tablet 3  . nebivolol (BYSTOLIC) 5 MG tablet Take 1 tablet (5 mg total) by mouth daily. 90 tablet 3  . olmesartan-hydrochlorothiazide ([name] HCT) 40-25 MG tablet Take 1 tablet by mouth daily. 90 tablet 3  . ONETOUCH DELICA LANCETS 33G MISC Use to test blood sugars twice daily. 200 each 3  . oxyCODONE (OXYCONTIN) 10 mg 12 hr tablet Take 1 tablet (10 mg total) by mouth every 12 (twelve) hours. 28 tablet 0  . ranitidine (ZANTAC) 300 MG tablet TAKE 1 TABLET (300 MG TOTAL) BY MOUTH AT BEDTIME. 90 tablet 3   No current facility-administered medications on file prior to visit.     Past Medical History:  Diagnosis Date  . Abnormal Pap smear 2008  . Adenomatous colon polyp   . Allergic rhinitis, cause unspecified   . Anxiety   . ASCUS with positive high risk HPV   . Blood transfusion 1976   Due to ectopic pregnancy  . Chronic back pain   . Ejection fraction    EF 45-50%, echo, October 27, 2011  . Endometrial mass 2011  . Esophageal reflux   . Fibroid   . H/O Clostridium difficile infection 03/2010  . H/O menorrhagia 2011  . Herpes simplex type 2 infection complicating pregnancy   . Hiatal hernia   . Iron deficiency anemia   . Morbid obesity (HCC) 2010  . Obstructive sleep apnea (adult) (pediatric)    cpap  . Panic attacks   . Pneumonia 2005; 2007; 2009; 2011  . Sarcoidosis [facility]) 1996   @ [facility]  . Simple endometrial hyperplasia 2011  . Spinal stenosis, lumbar    s/p decompression laminectomy 09/2013  . Syncope    ?? Syncope ??  . Type 2 diabetes, diet controlled (HCC)   . Unspecified asthma(493.90)   . Urinary incontinence, mixed 2011  . VIN II (vulvar intraepithelial neoplasia II)     Past Surgical History:  Procedure Laterality Date  . COLPOSCOPY VULVA W/ BIOPSY       "I've had maybe 3" (09/05/2013)  . DILATION AND CURETTAGE OF UTERUS  2007  . ECTOPIC PREGNANCY SURGERY  1976; ~ 1978  . HYSTEROSCOPY DIAGNOSTIC  2011  . LUMBAR LAMINECTOMY/DECOMPRESSION MICRODISCECTOMY N/A 09/05/2013   Procedure: Thoracic eleven-twelve Thoracic Laminectomy;  Surgeon: Tia Alert, MD;  Location: [facility];  Service: Neurosurgery;  Laterality: N/A;  Thoracic eleven-twelve Thoracic Laminectomy  . MAXIMUM ACCESS (MAS)POSTERIOR LUMBAR INTERBODY FUSION (PLIF) 1 LEVEL N/A 09/05/2013   Procedure: Lumbar four-five Maximum Access Surgery  Posterior lumbar interbody fusion;  Surgeon: Tia Alert, MD;  Location: [facility];  Service:  Neurosurgery;  Laterality: N/A;  Lumbar four-five Maximum Access Surgery  Posterior lumbar interbody fusion  . POSTERIOR LAMINECTOMY / DECOMPRESSION LUMBAR SPINE  09/05/2013  . POSTERIOR LUMBAR FUSION  09/05/2013  . THORACIC LAMINECTOMY  09/05/2013  . URETHRAL SLING  2011  . VULVECTOMY  ~ 2007   "for cancerous cells" (09/05/2013)    Social History   Social History  . Marital status: Married    Spouse name: N/A  . Number of children: 2  . Years of education: PHD   Occupational History  . retired   .  Unemployed   Social History Main Topics  . Smoking status: Never Smoker  . Smokeless tobacco: Never Used  . Alcohol use Yes     Comment: 09/05/2013 "might have a glass of wine maybe q couple months"  . Drug use: No  . Sexual activity: Yes    Birth control/ protection: Post-menopausal   Other Topics Concern  . None   Social History Narrative   Pt lives at home with her spouse.   She does not use caffeine.    Family History  Problem Relation Age of Onset  . Heart attack Father   . Hypertension Father   . Diabetes Father   . Seizures Father   . Migraines Father   . Kidney disease Sister   . Osteoporosis Sister   . Heart attack Sister   . Cancer Paternal Uncle   . Asthma Daughter   . Asthma Son   . Osteoporosis Sister   . Lung  cancer Maternal Uncle   . Kidney disease Other   . Diabetes Other   . High Cholesterol Brother     x2    Review of Systems  Constitutional: Positive for chills and fatigue. Negative for appetite change and fever.  HENT: Positive for sinus pain.   Respiratory: Positive for cough (dry). Negative for shortness of breath and wheezing.   Cardiovascular: Positive for leg swelling (mild intermittent). Negative for chest pain and palpitations.  Gastrointestinal: Positive for nausea (mild the other day). Negative for diarrhea.  Musculoskeletal: Positive for neck pain.  Neurological: Negative for light-headedness and headaches.       Objective:   Vitals:   02/01/17 1553  BP: 104/70  Pulse: 74  Resp: 16  Temp: 97.7 F (36.5 C)   Filed Weights   02/01/17 1553  Weight: 239 lb (108.4 kg)   Body mass index is 40.71 kg/m.  Wt Readings from Last 3 Encounters:  02/01/17 239 lb (108.4 kg)  01/03/17 240 lb 3.2 oz (109 kg)  11/26/16 237 lb (107.5 kg)     Physical Exam GENERAL APPEARANCE: Appears stated age, well appearing, NAD EYES: conjunctiva clear, no icterus HEENT: bilateral tympanic membranes and ear canals normal, oropharynx with mild erythema, no thyromegaly, trachea midline, no cervical or supraclavicular lymphadenopathy LUNGS: Clear to auscultation without wheeze or crackles, unlabored breathing, good air entry bilaterally HEART: Normal S1,S2 without murmurs EXTREMITIES: Without clubbing, cyanosis, or edema        Assessment & Plan:   See Problem List for Assessment and Plan of chronic medical problems.

## 2017-02-03 ENCOUNTER — Other Ambulatory Visit (HOSPITAL_COMMUNITY): Payer: Medicare Other

## 2017-02-04 ENCOUNTER — Telehealth: Payer: Self-pay | Admitting: Internal Medicine

## 2017-02-04 NOTE — Telephone Encounter (Signed)
Pt called in and said that her cough is not better and she is not feeling any better, just worse. She would like meds called in   [facility] [address]

## 2017-02-05 ENCOUNTER — Encounter: Payer: Self-pay | Admitting: Internal Medicine

## 2017-02-05 NOTE — Telephone Encounter (Signed)
Spoke with pt and advised that Dr. Caryl Bis would like for her to be re-evaluated and pt should be seen at [facility] or ED.  Pt verbalized understanding.

## 2017-02-07 NOTE — Telephone Encounter (Signed)
noted 

## 2017-02-08 MED ORDER — CEFDINIR 300 MG PO CAPS: 300.0000 mg | ORAL_CAPSULE | Freq: Two times a day (BID) | ORAL | 0 refills | Status: DC

## 2017-02-21 ENCOUNTER — Other Ambulatory Visit (HOSPITAL_COMMUNITY): Payer: Medicare Other

## 2017-03-02 ENCOUNTER — Ambulatory Visit: Payer: Medicare Other | Admitting: Pulmonary Disease

## 2017-03-09 ENCOUNTER — Ambulatory Visit (HOSPITAL_COMMUNITY): Payer: Medicare Other | Attending: Internal Medicine

## 2017-03-09 ENCOUNTER — Other Ambulatory Visit: Payer: Self-pay

## 2017-03-09 DIAGNOSIS — R0602 Shortness of breath: Secondary | ICD-10-CM | POA: Diagnosis not present

## 2017-03-09 DIAGNOSIS — I10 Essential (primary) hypertension: Secondary | ICD-10-CM | POA: Diagnosis not present

## 2017-03-09 DIAGNOSIS — G4733 Obstructive sleep apnea (adult) (pediatric): Secondary | ICD-10-CM | POA: Diagnosis not present

## 2017-03-09 DIAGNOSIS — D869 Sarcoidosis, unspecified: Secondary | ICD-10-CM | POA: Diagnosis not present

## 2017-03-09 DIAGNOSIS — E119 Type 2 diabetes mellitus without complications: Secondary | ICD-10-CM | POA: Diagnosis not present

## 2017-03-09 DIAGNOSIS — R55 Syncope and collapse: Secondary | ICD-10-CM | POA: Insufficient documentation

## 2017-03-11 ENCOUNTER — Telehealth: Payer: Self-pay | Admitting: *Deleted

## 2017-03-11 DIAGNOSIS — R0602 Shortness of breath: Secondary | ICD-10-CM

## 2017-03-11 NOTE — Telephone Encounter (Signed)
Called patient to give echo results/recommendations. The patient has several concerns. 1. There was an increased relative contribution of atrial contraction to ventricular filling, which may be due to hypovolemia 2.  Pulmonic valve:  Poorly visualized. The valve appears to be grossly normal.  Cusp separation was normal.   Pt would like Dr. Harrington Challenger to clarify both of the above statements.      Next, she cannot walk on treadmill and in the past had chemical stress test and had a reaction to the injected medication where her BP dropped to 40/25 and a code was called.   This was performed at [facility] in [place], [place] which she said is no longer in existence.  She does not know the name of cardiologist there.  Also, usually radiology has to put in her IVs due to her poor veins.  Pt is aware I am forwarding this information to Dr. Harrington Challenger and will call her back with further recommendations.  I will review with medical records to see if we can obtain any records from [facility]

## 2017-03-11 NOTE — Telephone Encounter (Signed)
-----   Message from [name] V, MD sent at 03/10/2017  3:23 PM EDT ----- Echo shows pumping function of the heart is normal  No valvular abnormalities I would set up for stress myovue to r/o ischemia as cause for SOB

## 2017-03-14 ENCOUNTER — Ambulatory Visit: Payer: Medicare Other | Admitting: Adult Health

## 2017-03-15 NOTE — Telephone Encounter (Signed)
I called pt and reviewed echo  Normal LVEF and RVEF  Mild diastolic dysfunciton Pt says she had a bad reaction in 2005 to a chemical stress test  BP dropped  "code blue " called  She does not remember details  Done in [place] Would recomm dobutamine echo Alos-- pt has very difficult veins  IV team has had to place in past or deep IV Need to see if this should be done at hospital

## 2017-03-17 NOTE — Telephone Encounter (Signed)
Called echo lab at hospital to find out if we are able to schedule dobutamine echo at the hospital.   I was informed that there needs to be a PA or doctor there when the test is being performed, which is what they do when they do an inpatient study.  Will review with Dr. Harrington Challenger.

## 2017-03-22 ENCOUNTER — Other Ambulatory Visit: Payer: Self-pay | Admitting: Emergency Medicine

## 2017-03-22 MED ORDER — ALPRAZOLAM 1 MG PO TABS: 1.0000 mg | ORAL_TABLET | Freq: Two times a day (BID) | ORAL | 0 refills | Status: DC | PRN

## 2017-03-22 NOTE — Progress Notes (Unsigned)
RX faxed to mail order pharmacy.

## 2017-03-28 NOTE — Telephone Encounter (Signed)
Per [facility], the patient called and rescheduled her dobutamine echo to 04/05/17.  I informed Dr. Harrington Challenger of this.  She is aware that if patient is going to have this study, an APP or physician needs to be present.

## 2017-03-29 ENCOUNTER — Ambulatory Visit (HOSPITAL_COMMUNITY): Payer: Medicare Other

## 2017-03-30 NOTE — Progress Notes (Deleted)
Pre visit review using our clinic review tool, if applicable. No additional management support is needed unless otherwise documented below in the visit note. 

## 2017-03-30 NOTE — Progress Notes (Deleted)
Subjective:   Cynthia Kirk is a 66 y.o. female who presents for Medicare Annual (Subsequent) preventive examination.  Review of Systems:  No ROS.  Medicare Wellness Visit.    Sleep patterns: {SX; SLEEP PATTERNS:18802::"feels rested on waking","does not get up to void","gets up *** times nightly to void","sleeps *** hours nightly"}.   Home Safety/Smoke Alarms:   Living environment; residence and Firearm Safety: {Rehab home environment / accessibility:30080::"no firearms","firearms stored safely"}. Seat Belt Safety/Bike Helmet: Wears seat belt.   Counseling:   Eye Exam-  Dental-  Female:   Pap- N/A      Mammo- Last 04/01/14, BI-RADS CATEGORY  1: Negative      Dexa scan- Last 08/31/16, norma;       CCS- Last 12/07/07, normal, recall 10 years      Objective:     Vitals: LMP 02/14/2014   There is no height or weight on file to calculate BMI.   Tobacco History  Smoking Status  . Never Smoker  Smokeless Tobacco  . Never Used     Counseling given: Not Answered   Past Medical History:  Diagnosis Date  . Abnormal Pap smear 2008  . Adenomatous colon polyp   . Allergic rhinitis, cause unspecified   . Anxiety   . ASCUS with positive high risk HPV   . Blood transfusion 1976   Due to ectopic pregnancy  . Chronic back pain   . Ejection fraction    EF 45-50%, echo, October 27, 2011  . Endometrial mass 2011  . Esophageal reflux   . Fibroid   . H/O Clostridium difficile infection 03/2010  . H/O menorrhagia 2011  . Herpes simplex type 2 infection complicating pregnancy   . Hiatal hernia   . Iron deficiency anemia   . Morbid obesity ([place]) 2010  . Obstructive sleep apnea (adult) (pediatric)    cpap  . Panic attacks   . Pneumonia 2005; 2007; 2009; 2011  . Sarcoidosis [facility]) 1996   @ [place]  . Simple endometrial hyperplasia 2011  . Spinal stenosis, lumbar    s/p decompression laminectomy 09/2013  . Syncope    ?? Syncope ??  . Type 2 diabetes, diet controlled ([place])   .  Unspecified asthma(493.90)   . Urinary incontinence, mixed 2011  . VIN II (vulvar intraepithelial neoplasia II)    Past Surgical History:  Procedure Laterality Date  . COLPOSCOPY VULVA W/ BIOPSY     "I've had maybe 3" (09/05/2013)  . DILATION AND CURETTAGE OF UTERUS  2007  . [place]; ~ 1978  . HYSTEROSCOPY DIAGNOSTIC  2011  . LUMBAR LAMINECTOMY/DECOMPRESSION MICRODISCECTOMY N/A 09/05/2013   Procedure: Thoracic eleven-twelve Thoracic Laminectomy;  Surgeon: Eustace Moore, MD;  Location: [facility];  Service: Neurosurgery;  Laterality: N/A;  Thoracic eleven-twelve Thoracic Laminectomy  . MAXIMUM ACCESS (MAS)POSTERIOR LUMBAR INTERBODY FUSION (PLIF) 1 LEVEL N/A 09/05/2013   Procedure: Lumbar four-five Maximum Access Surgery  Posterior lumbar interbody fusion;  Surgeon: Eustace Moore, MD;  Location: [place] [facility];  Service: Neurosurgery;  Laterality: N/A;  Lumbar four-five Maximum Access Surgery  Posterior lumbar interbody fusion  . POSTERIOR LAMINECTOMY / DECOMPRESSION LUMBAR SPINE  09/05/2013  . POSTERIOR LUMBAR FUSION  09/05/2013  . THORACIC LAMINECTOMY  09/05/2013  . URETHRAL SLING  2011  . VULVECTOMY  ~ 2007   "for cancerous cells" (09/05/2013)   Family History  Problem Relation Age of Onset  . Heart attack Father   . Hypertension Father   .  Diabetes Father   . Seizures Father   . Migraines Father   . Kidney disease Sister   . Osteoporosis Sister   . Heart attack Sister   . Cancer Paternal Uncle   . Asthma Daughter   . Asthma Son   . Osteoporosis Sister   . Lung cancer Maternal Uncle   . Kidney disease Other   . Diabetes Other   . High Cholesterol Brother     x2   History  Sexual Activity  . Sexual activity: Yes  . Birth control/ protection: Post-menopausal    Outpatient Encounter Prescriptions as of 03/31/2017  Medication Sig  . albuterol (PROVENTIL HFA;VENTOLIN HFA) 108 (90 BASE) MCG/ACT inhaler Inhale 2 puffs into the lungs every 6 (six) hours as  needed for wheezing or shortness of breath.  [name] albuterol (PROVENTIL) (2.5 MG/3ML) 0.083% nebulizer solution Take 3 mLs (2.5 mg total) by nebulization every 6 (six) hours as needed for shortness of breath.  . ALPRAZolam (XANAX) 1 MG tablet Take 1 tablet (1 mg total) by mouth 2 (two) times daily as needed.  . BD PEN NEEDLE NANO U/F 32G X 4 MM MISC USE 3 TIMES DAILY AS DIRECTED  . Blood Glucose Monitoring Suppl (ONE TOUCH ULTRA SYSTEM KIT) w/Device KIT Test blood sugars daily. DX E11.9  . cefdinir (OMNICEF) 300 MG capsule Take 1 capsule (300 mg total) by mouth 2 (two) times daily.  . chlorpheniramine-HYDROcodone (TUSSIONEX PENNKINETIC ER) 10-8 MG/5ML SUER Take 5 mLs by mouth every 12 (twelve) hours as needed for cough.  . clotrimazole-betamethasone (LOTRISONE) cream Apply topically 2 (two) times daily.  . Coenzyme Q10 (COQ10) 200 MG CAPS Take 1 capsule by mouth daily.  . cyclobenzaprine (FLEXERIL) 10 MG tablet TAKE 0.5-1 TABLETS (5-10 MG TOTAL) BY MOUTH 3 (THREE) TIMES DAILY AS NEEDED FOR MUSCLE SPASMS.  [name] dexlansoprazole (DEXILANT) 60 MG capsule Take 1 capsule (60 mg total) by mouth daily.  [name] ESTRACE VAGINAL 0.1 MG/GM vaginal cream   . gabapentin (NEURONTIN) 100 MG capsule TAKE 1 CAPSULE AT BEDTIME  . glucose blood (ONE TOUCH ULTRA TEST) test strip Use to check blood sugars twice a day Dx: E11.9  . hydrocortisone (PROCTOSOL HC) 2.5 % rectal cream APPLY RECTALLY 2 (TWO) TIMES DAILY.  . hydroquinone 4 % cream Apply topically 2 (two) times daily.  . Lancets (ONETOUCH ULTRASOFT) lancets Use to check blood sugar twice a day. Dx E11.9  . Liraglutide (VICTOZA) 18 MG/3ML SOPN INJECT 1.8 MG SQ DAILY  . mometasone (ASMANEX 60 METERED DOSES) 220 MCG/INH inhaler INHALE 2 PUFFS INTO THE LUNGS DAILY.  . montelukast (SINGULAIR) 10 MG tablet TAKE 1 TABLET (10 MG TOTAL) BY MOUTH AT BEDTIME.  . nebivolol (BYSTOLIC) 5 MG tablet Take 1 tablet (5 mg total) by mouth daily.  [name] olmesartan-hydrochlorothiazide ([name]  HCT) 40-25 MG tablet Take 1 tablet by mouth daily.  [name] DELICA LANCETS 94H MISC Use to test blood sugars twice daily.  . ranitidine (ZANTAC) 300 MG tablet TAKE 1 TABLET (300 MG TOTAL) BY MOUTH AT BEDTIME.   No facility-administered encounter medications on file as of 03/31/2017.     Activities of Daily Living In your present state of health, do you have any difficulty performing the following activities: 03/30/2016  Hearing? N  Vision? N  Difficulty concentrating or making decisions? N  Walking or climbing stairs? Y  Dressing or bathing? N  Doing errands, shopping? N  Preparing Food and eating ? N  Using the Toilet? N  In the  past six months, have you accidently leaked urine? Y  Do you have problems with loss of bowel control? N  Managing your Medications? N  Managing your Finances? N  Housekeeping or managing your Housekeeping? N  Some recent data might be hidden    Patient Care Team: [name], MD as PCP - General (Internal Medicine) Suella Broad, MD as Consulting Physician (Physical Medicine and Rehabilitation) Chesley Mires, MD as Consulting Physician (Pulmonary Disease) Ladene Artist, MD as Consulting Physician (Gastroenterology) Melida Quitter, MD as Consulting Physician (Otolaryngology) Everett Graff, MD as Consulting Physician (Obstetrics and Gynecology) Eustace Moore, MD (Neurosurgery)    Assessment:    Physical assessment deferred to PCP.  Exercise Activities and Dietary recommendations   Diet (meal preparation, eat out, water intake, caffeinated beverages, dairy products, fruits and vegetables): {Desc; diets:16563} Breakfast: Lunch:  Dinner:      Goals    . Weight < 180 lb (81.647 kg)          On diabetic is helping Belong to the weight watchers; online       Fall Risk Fall Risk  03/30/2016 03/04/2014 01/29/2013  Falls in the past year? No No No   Depression Screen PHQ 2/9 Scores 03/30/2016 03/04/2014 01/29/2013  PHQ - 2 Score 0 0 0      Cognitive Function        Immunization History  Administered Date(s) Administered  . Influenza Split 10/06/2011  . Influenza Whole 10/14/2009, 09/14/2010  . Influenza, High Dose Seasonal PF 08/19/2016  . Influenza, Seasonal, Injecte, Preservative Fre 01/29/2013  . Influenza,inj,Quad PF,36+ Mos 08/16/2013, 09/09/2014, 01/02/2016  . Pneumococcal Conjugate-13 11/15/2014  . Pneumococcal Polysaccharide-23 03/06/2008, 08/19/2016  . Td 12/06/2004  . Tdap 07/09/2015   Screening Tests Health Maintenance  Topic Date Due  . MAMMOGRAM  04/01/2016  . HEMOGLOBIN A1C  01/23/2017  . FOOT EXAM  07/01/2017  . INFLUENZA VACCINE  07/06/2017  . OPHTHALMOLOGY EXAM  07/20/2017  . COLONOSCOPY  12/06/2017  . DEXA SCAN  08/31/2021  . TETANUS/TDAP  07/08/2025  . Hepatitis C Screening  Completed  . PNA vac Low Risk Adult  Completed      Plan:    During the course of the visit the patient was educated and counseled about the following appropriate screening and preventive services:   Vaccines to include Pneumoccal, Influenza, Hepatitis B, Td, Zostavax, HCV  Cardiovascular Disease  Colorectal cancer screening  Bone density screening  Diabetes screening  Glaucoma screening  Mammography/PAP  Nutrition counseling   Patient Instructions (the written plan) was given to the patient.   Michiel Cowboy, RN  03/30/2017

## 2017-03-31 ENCOUNTER — Ambulatory Visit: Payer: Medicare Other

## 2017-04-05 ENCOUNTER — Ambulatory Visit (HOSPITAL_COMMUNITY): Payer: Medicare Other

## 2017-04-05 NOTE — Progress Notes (Signed)
Pre visit review using our clinic review tool, if applicable. No additional management support is needed unless otherwise documented below in the visit note. 

## 2017-04-05 NOTE — Progress Notes (Addendum)
Subjective:   Cynthia Kirk is a 66 y.o. female who presents for Medicare Annual (Subsequent) preventive examination.  Review of Systems:  No ROS.  Medicare Wellness Visit.  Cardiac Risk Factors include: advanced age (>23mn, >>71women);diabetes mellitus;dyslipidemia;hypertension;obesity (BMI >30kg/m2) Sleep patterns: feels rested on waking, gets up 3-4 times nightly to void and sleeps 8-10 hours nightly.  Wears C-PAP Home Safety/Smoke Alarms: Feels safe in home. Smoke alarms in place.     Living environment; residence and Firearm Safety: 1-story house/ trailer, equipment: Walkers, Type: RConservation officer, natureand HOmnicom Type: Commode, no firearms. Lives with husband Seat Belt Safety/Bike Helmet: Wears seat belt.   Counseling:   Eye Exam- appointment yearly Dental- appointment every 6 months  Female:   Pap- N/A      Mammo- Last 04/01/14,  BI-RADS CATEGORY  1: Negative, patient states she will make an appointment, declines a referral being placed today     Dexa scan-  Last 08/31/16, normal      CCS- Last 12/07/07, recall 10 years      Objective:     Vitals: BP 118/74   Pulse 72   Resp 20   Ht '5\' 4"'$  (1.626 m)   Wt 236 lb (107 kg)   LMP 02/14/2014   SpO2 97%   BMI 40.51 kg/m   Body mass index is 40.51 kg/m.   Tobacco History  Smoking Status  . Never Smoker  Smokeless Tobacco  . Never Used     Counseling given: Not Answered   Past Medical History:  Diagnosis Date  . Abnormal Pap smear 2008  . Adenomatous colon polyp   . Allergic rhinitis, cause unspecified   . Anxiety   . ASCUS with positive high risk HPV   . Blood transfusion 1976   Due to ectopic pregnancy  . Chronic back pain   . Ejection fraction    EF 45-50%, echo, October 27, 2011  . Endometrial mass 2011  . Esophageal reflux   . Fibroid   . H/O Clostridium difficile infection 03/2010  . H/O menorrhagia 2011  . Herpes simplex type 2 infection complicating pregnancy   . Hiatal hernia   . Iron  deficiency anemia   . Morbid obesity ([place] 2010  . Obstructive sleep apnea (adult) (pediatric)    cpap  . Panic attacks   . Pneumonia 2005; 2007; 2009; 2011  . Sarcoidosis 1996   [facility] [employer] . Simple endometrial hyperplasia 2011  . Spinal stenosis, lumbar    s/p decompression laminectomy 09/2013  . Syncope    ?? Syncope ??  . Type 2 diabetes, diet controlled ([place]   . Unspecified asthma(493.90)   . Urinary incontinence, mixed 2011  . VIN II (vulvar intraepithelial neoplasia II)    Past Surgical History:  Procedure Laterality Date  . COLPOSCOPY VULVA W/ BIOPSY     "I've had maybe 3" (09/05/2013)  . DILATION AND CURETTAGE OF UTERUS  2007  . [place]  . HYSTEROSCOPY DIAGNOSTIC  2011  . LUMBAR LAMINECTOMY/DECOMPRESSION MICRODISCECTOMY N/A 09/05/2013   Procedure: Thoracic eleven-twelve Thoracic Laminectomy;  Surgeon: DEustace Moore MD;  [facility];  Service: Neurosurgery;  Laterality: N/A;  Thoracic eleven-twelve Thoracic Laminectomy  . MAXIMUM ACCESS (MAS)POSTERIOR LUMBAR INTERBODY FUSION (PLIF) 1 LEVEL N/A 09/05/2013   Procedure: Lumbar four-five Maximum Access Surgery  Posterior lumbar interbody fusion;  Surgeon: DEustace Moore MD;  [facility];  Service: Neurosurgery;  Laterality: N/A;  Lumbar  four-five Maximum Access Surgery  Posterior lumbar interbody fusion  . POSTERIOR LAMINECTOMY / DECOMPRESSION LUMBAR SPINE  09/05/2013  . POSTERIOR LUMBAR FUSION  09/05/2013  . THORACIC LAMINECTOMY  09/05/2013  . URETHRAL SLING  2011  . VULVECTOMY  ~ 2007   "for cancerous cells" (09/05/2013)   Family History  Problem Relation Age of Onset  . Heart attack Father   . Hypertension Father   . Diabetes Father   . Seizures Father   . Migraines Father   . Kidney disease Sister   . Osteoporosis Sister   . Heart attack Sister   . Cancer Paternal Uncle   . Asthma Daughter   . Asthma Son   . Osteoporosis Sister   . Lung cancer Maternal Uncle   .  Kidney disease Other   . Diabetes Other   . High Cholesterol Brother     x2   History  Sexual Activity  . Sexual activity: Yes  . Birth control/ protection: Post-menopausal    Outpatient Encounter Prescriptions as of 04/06/2017  Medication Sig  . albuterol (PROVENTIL HFA;VENTOLIN HFA) 108 (90 BASE) MCG/ACT inhaler Inhale 2 puffs into the lungs every 6 (six) hours as needed for wheezing or shortness of breath.  [name] albuterol (PROVENTIL) (2.5 MG/3ML) 0.083% nebulizer solution Take 3 mLs (2.5 mg total) by nebulization every 6 (six) hours as needed for shortness of breath.  . ALPRAZolam (XANAX) 1 MG tablet Take 1 tablet (1 mg total) by mouth 2 (two) times daily as needed.  . BD PEN NEEDLE NANO U/F 32G X 4 MM MISC USE 3 TIMES DAILY AS DIRECTED  . Blood Glucose Monitoring Suppl (ONE TOUCH ULTRA SYSTEM KIT) w/Device KIT Test blood sugars daily. DX E11.9  . cefdinir (OMNICEF) 300 MG capsule Take 1 capsule (300 mg total) by mouth 2 (two) times daily.  . clotrimazole-betamethasone (LOTRISONE) cream Apply topically 2 (two) times daily.  . Coenzyme Q10 (COQ10) 200 MG CAPS Take 1 capsule by mouth daily.  . cyclobenzaprine (FLEXERIL) 10 MG tablet TAKE 0.5-1 TABLETS (5-10 MG TOTAL) BY MOUTH 3 (THREE) TIMES DAILY AS NEEDED FOR MUSCLE SPASMS.  [name] ESTRACE VAGINAL 0.1 MG/GM vaginal cream   . gabapentin (NEURONTIN) 100 MG capsule TAKE 1 CAPSULE AT BEDTIME  . glucose blood (ONE TOUCH ULTRA TEST) test strip Use to check blood sugars twice a day Dx: E11.9  . hydrocortisone (PROCTOSOL HC) 2.5 % rectal cream APPLY RECTALLY 2 (TWO) TIMES DAILY.  . hydroquinone 4 % cream Apply topically 2 (two) times daily.  . Lancets (ONETOUCH ULTRASOFT) lancets Use to check blood sugar twice a day. Dx E11.9  . Liraglutide (VICTOZA) 18 MG/3ML SOPN INJECT 1.8 MG SQ DAILY  . mometasone (ASMANEX 60 METERED DOSES) 220 MCG/INH inhaler INHALE 2 PUFFS INTO THE LUNGS DAILY.  . montelukast (SINGULAIR) 10 MG tablet TAKE 1 TABLET (10 MG TOTAL)  BY MOUTH AT BEDTIME.  . nebivolol (BYSTOLIC) 5 MG tablet Take 1 tablet (5 mg total) by mouth daily.  [name] olmesartan-hydrochlorothiazide ([name] HCT) 40-25 MG tablet Take 1 tablet by mouth daily.  [name] DELICA LANCETS 84X MISC Use to test blood sugars twice daily.  . [DISCONTINUED] dexlansoprazole (DEXILANT) 60 MG capsule Take 1 capsule (60 mg total) by mouth daily.  . [DISCONTINUED] ranitidine (ZANTAC) 300 MG tablet TAKE 1 TABLET (300 MG TOTAL) BY MOUTH AT BEDTIME.  . [DISCONTINUED] chlorpheniramine-HYDROcodone (TUSSIONEX PENNKINETIC ER) 10-8 MG/5ML SUER Take 5 mLs by mouth every 12 (twelve) hours as needed for cough. (Patient not taking:  Reported on 04/06/2017)   No facility-administered encounter medications on file as of 04/06/2017.     Activities of Daily Living In your present state of health, do you have any difficulty performing the following activities: 04/06/2017  Hearing? N  Vision? N  Difficulty concentrating or making decisions? N  Walking or climbing stairs? N  Dressing or bathing? N  Doing errands, shopping? N  Preparing Food and eating ? N  Using the Toilet? N  In the past six months, have you accidently leaked urine? N  Do you have problems with loss of bowel control? N  Managing your Medications? N  Managing your Finances? N  Housekeeping or managing your Housekeeping? N  Some recent data might be hidden    Patient Care Team: [name], MD as PCP - General (Internal Medicine) Suella Broad, MD as Consulting Physician (Physical Medicine and Rehabilitation) Chesley Mires, MD as Consulting Physician (Pulmonary Disease) Ladene Artist, MD as Consulting Physician (Gastroenterology) Melida Quitter, MD as Consulting Physician (Otolaryngology) Everett Graff, MD as Consulting Physician (Obstetrics and Gynecology) Eustace Moore, MD (Neurosurgery) Fay Records, MD as Consulting Physician (Cardiology)    Assessment:     Physical assessment deferred to PCP.  Exercise  Activities and Dietary recommendations Current Exercise Habits: Home exercise routine, Type of exercise: stretching;strength training/weights;calisthenics;walking, Time (Minutes): 30, Frequency (Times/Week): 3, Weekly Exercise (Minutes/Week): 90, Intensity: Mild, Exercise limited by: respiratory conditions(s)  Diet (meal preparation, eat out, water intake, caffeinated beverages, dairy products, fruits and vegetables): in general, a "healthy" diet  , well balanced, diabetic, low fat/ cholesterol, low salt, patient reports that she has changed her eating habits and eats a heart healthy and diabetic diet. Limits caffeine, soda, drinks 8-10 glasses of water daily.   Goals    . Increase how much I am walking per week          Start to walk at the gym 3 times a week.    . Weight < 180 lb (81.647 kg)          On diabetic is helping Belong to the weight watchers; online       Fall Risk Fall Risk  04/06/2017 03/30/2016 03/04/2014 01/29/2013  Falls in the past year? No No No No   Depression Screen PHQ 2/9 Scores 04/06/2017 03/30/2016 03/04/2014 01/29/2013  PHQ - 2 Score 0 0 0 0     Cognitive Function       Ad8 score reviewed for issues:  Issues making decisions: no  Less interest in hobbies / activities: no  Repeats questions, stories (family complaining): no  Trouble using ordinary gadgets (microwave, computer, phone): no  Forgets the month or year: no  Mismanaging finances: no  Remembering appts: no  Daily problems with thinking and/or memory: no Ad8 score is= 0  Immunization History  Administered Date(s) Administered  . Influenza Split 10/06/2011  . Influenza Whole 10/14/2009, 09/14/2010  . Influenza, High Dose Seasonal PF 08/19/2016  . Influenza, Seasonal, Injecte, Preservative Fre 01/29/2013  . Influenza,inj,Quad PF,36+ Mos 08/16/2013, 09/09/2014, 01/02/2016  . Pneumococcal Conjugate-13 11/15/2014  . Pneumococcal Polysaccharide-23 03/06/2008, 08/19/2016  . Td 12/06/2004    . Tdap 07/09/2015   Screening Tests Health Maintenance  Topic Date Due  . MAMMOGRAM  04/01/2016  . HEMOGLOBIN A1C  01/23/2017  . FOOT EXAM  07/01/2017  . INFLUENZA VACCINE  07/06/2017  . OPHTHALMOLOGY EXAM  07/20/2017  . COLONOSCOPY  12/06/2017  . DEXA SCAN  08/31/2021  . TETANUS/TDAP  07/08/2025  .  Hepatitis C Screening  Completed  . PNA vac Low Risk Adult  Completed      Plan:    Continue to eat heart healthy diet (full of fruits, vegetables, whole grains, lean protein, water--limit salt, fat, and sugar intake) and increase physical activity as tolerated.  Continue doing brain stimulating activities (puzzles, reading, adult coloring books, staying active) to keep memory sharp.   I have personally reviewed and noted the following in the patient's chart:   . Medical and social history . Use of alcohol, tobacco or illicit drugs  . Current medications and supplements . Functional ability and status . Nutritional status . Physical activity . Advanced directives . List of other physicians . Hospitalizations, surgeries, and ER visits in previous 12 months . Vitals . Screenings to include cognitive, depression, and falls . Referrals and appointments  In addition, I have reviewed and discussed with patient certain preventive protocols, quality metrics, and best practice recommendations. A written personalized care plan for preventive services as well as general preventive health recommendations were provided to patient.     Michiel Cowboy, RN  04/06/2017   Medical screening examination/treatment/procedure(s) were performed by non-physician practitioner and as supervising physician I was immediately available for consultation/collaboration. I agree with above. [name], MD

## 2017-04-06 ENCOUNTER — Ambulatory Visit (INDEPENDENT_AMBULATORY_CARE_PROVIDER_SITE_OTHER): Payer: Medicare Other | Admitting: Gastroenterology

## 2017-04-06 ENCOUNTER — Encounter: Payer: Self-pay | Admitting: Gastroenterology

## 2017-04-06 ENCOUNTER — Ambulatory Visit (INDEPENDENT_AMBULATORY_CARE_PROVIDER_SITE_OTHER): Payer: Medicare Other | Admitting: *Deleted

## 2017-04-06 VITALS — BP 108/70 | HR 80 | Ht 64.25 in | Wt 236.0 lb

## 2017-04-06 VITALS — BP 118/74 | HR 72 | Resp 20 | Ht 64.0 in | Wt 236.0 lb

## 2017-04-06 DIAGNOSIS — K219 Gastro-esophageal reflux disease without esophagitis: Secondary | ICD-10-CM

## 2017-04-06 DIAGNOSIS — Z0000 Encounter for general adult medical examination without abnormal findings: Secondary | ICD-10-CM

## 2017-04-06 MED ORDER — RANITIDINE HCL 300 MG PO TABS: ORAL_TABLET | ORAL | 3 refills | Status: DC

## 2017-04-06 MED ORDER — DEXLANSOPRAZOLE 60 MG PO CPDR: 1.0000 | DELAYED_RELEASE_CAPSULE | Freq: Every day | ORAL | 3 refills | Status: DC

## 2017-04-06 NOTE — Patient Instructions (Signed)
We have sent the following prescriptions to your mail in pharmacy:ranitidine 300 mg twice daily and Dexilant 60 mg daily.    If you have not heard from your mail in pharmacy within 1 week or if you have not received your medication in the mail, please contact us at [phone] so we may find out why.   Call our office back if you see no improvement in your symptoms.   Normal BMI (Body Mass Index- based on height and weight) is between 23 and 30. Your BMI today is Body mass index is 40.19 kg/m. [name] Please consider follow up  regarding your BMI with your Primary Care Provider.   Thank you for choosing me and [facility].  Pricilla Riffle. Dagoberto Ligas., MD., [facility] Regal

## 2017-04-06 NOTE — Progress Notes (Signed)
    History of Present Illness: This is a 67 year old female with GERD. She is accompanied by her husband. She has noted frequent breakthrough symptoms in the mid-to-late afternoon. Other than that her symptoms have been under very good control. TUMS and snacks improve symptoms.   Colonoscopy 09/2011 in [place]: mild sigmoid diverticulosis otherwise normal.  EGD 02/2009 in [place]: small HH otherwise negative. Distal esophageal and gastric biopsies were negative.    Current Medications, Allergies, Past Medical History, Past Surgical History, Family History and Social History were reviewed in [employer] record.  Physical Exam: General: Well developed, well nourished, no acute distress Head: Normocephalic and atraumatic Eyes:  sclerae anicteric, EOMI Ears: Normal auditory acuity Mouth: No deformity or lesions Lungs: Clear throughout to auscultation Heart: Regular rate and rhythm; no murmurs, rubs or bruits Abdomen: Soft, non tender and non distended. No masses, hepatosplenomegaly or hernias noted. Normal Bowel sounds Musculoskeletal: Symmetrical with no gross deformities  Pulses:  Normal pulses noted Extremities: No clubbing, cyanosis, edema or deformities noted Neurological: Alert oriented x 4, grossly nonfocal Psychological:  Alert and cooperative. Normal mood and affect  Assessment and Recommendations:  1. GERD. Continue to follow standard antireflux measures. Continue dexlansoprazole 60 mg daily and increase ranitidine to 300 mg twice daily. May reduce ranitidine to at bedtime when symptoms have improved.   I spent 15 minutes of face-to-face time with the patient. Greater than 50% of the time was spent counseling and coordinating care.

## 2017-04-06 NOTE — Patient Instructions (Signed)
Continue to eat heart healthy diet (full of fruits, vegetables, whole grains, lean protein, water--limit salt, fat, and sugar intake) and increase physical activity as tolerated.  Continue doing brain stimulating activities (puzzles, reading, adult coloring books, staying active) to keep memory sharp.    [name] , Thank you for taking time to come for your Medicare Wellness Visit. I appreciate your ongoing commitment to your health goals. Please review the following plan we discussed and let me know if I can assist you in the future.   These are the goals we discussed: Goals    . Increase how much I am walking per week          Start to walk at the gym 3 times a week.    . Weight < 180 lb (81.647 kg)          On diabetic is helping Belong to the weight watchers; online        This is a list of the screening recommended for you and due dates:  Health Maintenance  Topic Date Due  . Mammogram  04/01/2016  . Hemoglobin A1C  01/23/2017  . Complete foot exam   07/01/2017  . Flu Shot  07/06/2017  . Eye exam for diabetics  07/20/2017  . Colon Cancer Screening  12/06/2017  . DEXA scan (bone density measurement)  08/31/2021  . Tetanus Vaccine  07/08/2025  .  Hepatitis C: One time screening is recommended by Center for Disease Control  (CDC) for  adults born from [birth year] through 1965.   Completed  . Pneumonia vaccines  Completed

## 2017-04-10 ENCOUNTER — Other Ambulatory Visit: Payer: Self-pay | Admitting: Internal Medicine

## 2017-04-18 ENCOUNTER — Ambulatory Visit (HOSPITAL_COMMUNITY): Admission: RE | Admit: 2017-04-18 | Payer: Medicare Other | Source: Ambulatory Visit

## 2017-04-19 ENCOUNTER — Ambulatory Visit (INDEPENDENT_AMBULATORY_CARE_PROVIDER_SITE_OTHER): Payer: Medicare Other | Admitting: Adult Health

## 2017-04-19 ENCOUNTER — Encounter: Payer: Self-pay | Admitting: Adult Health

## 2017-04-19 DIAGNOSIS — J0100 Acute maxillary sinusitis, unspecified: Secondary | ICD-10-CM | POA: Diagnosis not present

## 2017-04-19 DIAGNOSIS — J019 Acute sinusitis, unspecified: Secondary | ICD-10-CM | POA: Insufficient documentation

## 2017-04-19 DIAGNOSIS — J4531 Mild persistent asthma with (acute) exacerbation: Secondary | ICD-10-CM

## 2017-04-19 MED ORDER — AMOXICILLIN-POT CLAVULANATE 875-125 MG PO TABS: 1.0000 | ORAL_TABLET | Freq: Two times a day (BID) | ORAL | 0 refills | Status: DC

## 2017-04-19 NOTE — Assessment & Plan Note (Signed)
Flare with sinusitis   Plan  Patient Instructions  Augmentin 875mg  .Twice daily  For 10 days , take w/ food, eat yogurt.. Can try probiotic while taking .  Mucinex DM Twice daily  As needed  Cough/congestion  Saline nasal rinses As needed   Fluids, rest and tylenol .  Please contact office for sooner follow up if symptoms do not improve or worsen or seek emergency care  follow up with Dr. Halford Chessman  In 3 months and As needed

## 2017-04-19 NOTE — Progress Notes (Signed)
I have reviewed and agree with assessment/plan.  Chesley Mires, MD [facility] Pulmonary/Critical Care 04/19/2017, 3:41 PM Pager:  [phone]

## 2017-04-19 NOTE — Progress Notes (Signed)
$'@Patient'k$  ID: Cynthia Kirk, female    DOB: October 18, 1951, 66 y.o.   MRN: [id number]  Chief Complaint  Patient presents with  . Acute Visit    cough, PND, SOB, Sore throat and fever x 1 week. Clear to light yellow mucus.     Referring provider: [name], MD  HPI: 66 year old female followed for asthma, sarcoidosis, obstructive sleep apnea, rhinitis and hoarseness  TEST  Tests Spirometry 06/30/09 >> FEV1 1.69(87%), FVC 2.17(83%), FEV1% 78 PSG 03/06/14 >> AHI 1.9, REM AHI 26.7, SaO2 low 80%. PSG 05/28/14 >> AHI 5.8, SaO2 low 85%. REM AHI 29.3 CT chest 07/17/14 >> upper lobe predominant BTX, GGO, nodularity no change since 2011 PFT 11/15/14 >> FEV1 1.76 (87%), FEV1% 77, TLC 3.19 (63%), DLCO 74%, no BD CPAP 07/15/15 to 08/13/15 >> used on 30 of 30 nights with average 9 hrs and 21 min.  Average AHI is 0.5 with CPAP 10 cm H2O.  04/19/2017 Acute OV : Cough  Patient presents for an acute office visit. Patient complains of 1 week of cough , congestion , drainage , sob, fever and sore throat for 1 week. Coughing up clear to yellow mucus.. Has sinus congestion and pressure. Felt feverish.and achy. Taking mucinex, tylenol and cold meds.  Remains on Asmanex daily.  No chest pain ,orthopnea,edema or hemoptysis .      Allergies  Allergen Reactions  . Aspirin     REACTION: hives  . Ibuprofen     Upsets stomach  . Latex     REACTION: rash    Immunization History  Administered Date(s) Administered  . Influenza Split 10/06/2011  . Influenza Whole 10/14/2009, 09/14/2010  . Influenza, High Dose Seasonal PF 08/19/2016  . Influenza, Seasonal, Injecte, Preservative Fre 01/29/2013  . Influenza,inj,Quad PF,36+ Mos 08/16/2013, 09/09/2014, 01/02/2016  . Pneumococcal Conjugate-13 11/15/2014  . Pneumococcal Polysaccharide-23 03/06/2008, 08/19/2016  . Td 12/06/2004  . Tdap 07/09/2015    Past Medical History:  Diagnosis Date  . Abnormal Pap smear 2008  . Adenomatous colon polyp   . Allergic  rhinitis, cause unspecified   . Anxiety   . ASCUS with positive high risk HPV   . Blood transfusion 1976   Due to ectopic pregnancy  . Chronic back pain   . Ejection fraction    EF 45-50%, echo, October 27, 2011  . Endometrial mass 2011  . Esophageal reflux   . Fibroid   . H/O Clostridium difficile infection 03/2010  . H/O menorrhagia 2011  . Herpes simplex type 2 infection complicating pregnancy   . Hiatal hernia   . Iron deficiency anemia   . Morbid obesity ([place]) 2010  . Obstructive sleep apnea (adult) (pediatric)    cpap  . Panic attacks   . Pneumonia 2005; 2007; 2009; 2011  . Sarcoidosis 1996   @ [employer]  . Simple endometrial hyperplasia 2011  . Spinal stenosis, lumbar    s/p decompression laminectomy 09/2013  . Syncope    ?? Syncope ??  . Type 2 diabetes, diet controlled ([place])   . Unspecified asthma(493.90)   . Urinary incontinence, mixed 2011  . VIN II (vulvar intraepithelial neoplasia II)     Tobacco History: History  Smoking Status  . Never Smoker  Smokeless Tobacco  . Never Used   Counseling given: Not Answered   Outpatient Encounter Prescriptions as of 04/19/2017  Medication Sig  . albuterol (PROVENTIL HFA;VENTOLIN HFA) 108 (90 BASE) MCG/ACT inhaler Inhale 2 puffs into the lungs every 6 (six) hours as needed  for wheezing or shortness of breath.  [name] albuterol (PROVENTIL) (2.5 MG/3ML) 0.083% nebulizer solution Take 3 mLs (2.5 mg total) by nebulization every 6 (six) hours as needed for shortness of breath.  . ALPRAZolam (XANAX) 1 MG tablet Take 1 tablet (1 mg total) by mouth 2 (two) times daily as needed.  . BD PEN NEEDLE NANO U/F 32G X 4 MM MISC USE 3 TIMES DAILY AS DIRECTED  . Blood Glucose Monitoring Suppl (ONE TOUCH ULTRA SYSTEM KIT) w/Device KIT Test blood sugars daily. DX E11.9  . cefdinir (OMNICEF) 300 MG capsule Take 1 capsule (300 mg total) by mouth 2 (two) times daily.  . clotrimazole-betamethasone (LOTRISONE) cream Apply topically 2 (two) times daily.    . Coenzyme Q10 (COQ10) 200 MG CAPS Take 1 capsule by mouth daily.  . cyclobenzaprine (FLEXERIL) 10 MG tablet TAKE 0.5-1 TABLETS (5-10 MG TOTAL) BY MOUTH 3 (THREE) TIMES DAILY AS NEEDED FOR MUSCLE SPASMS.  [name] dexlansoprazole (DEXILANT) 60 MG capsule Take 1 capsule (60 mg total) by mouth daily.  [name] ESTRACE VAGINAL 0.1 MG/GM vaginal cream   . gabapentin (NEURONTIN) 100 MG capsule TAKE 1 CAPSULE AT BEDTIME  . glucose blood (ONE TOUCH ULTRA TEST) test strip Use to check blood sugars twice a day Dx: E11.9  . hydrocortisone (PROCTOSOL HC) 2.5 % rectal cream APPLY RECTALLY 2 (TWO) TIMES DAILY.  . hydroquinone 4 % cream Apply topically 2 (two) times daily.  . Lancets (ONETOUCH ULTRASOFT) lancets Use to check blood sugar twice a day. Dx E11.9  . Liraglutide (VICTOZA) 18 MG/3ML SOPN INJECT 1.8 MG SQ DAILY  . mometasone (ASMANEX 60 METERED DOSES) 220 MCG/INH inhaler INHALE 2 PUFFS INTO THE LUNGS DAILY.  . montelukast (SINGULAIR) 10 MG tablet TAKE 1 TABLET (10 MG TOTAL) BY MOUTH AT BEDTIME.  . nebivolol (BYSTOLIC) 5 MG tablet Take 1 tablet (5 mg total) by mouth daily.  [name] olmesartan-hydrochlorothiazide ([name] HCT) 40-25 MG tablet Take 1 tablet by mouth daily.  [name] DELICA LANCETS 70Y MISC Use to test blood sugars twice daily.  . ranitidine (ZANTAC) 300 MG tablet TAKE 1 TABLET (300 MG TOTAL) BY MOUTH TWICE DAILY  . VICTOZA 18 MG/3ML SOPN INJECT 1.'8MG'$  SUBCUTANEOUSLYDAILY  . amoxicillin-clavulanate (AUGMENTIN) 875-125 MG tablet Take 1 tablet by mouth 2 (two) times daily.   No facility-administered encounter medications on file as of 04/19/2017.      Review of Systems  Constitutional:   No  weight loss, night sweats,  + Fevers, chills, fatigue, or  lassitude.  HEENT:   No headaches,  Difficulty swallowing,  Tooth/dental problems, or  Sore throat,                No sneezing, itching, ear ache,  +nasal congestion, post nasal drip,   CV:  No chest pain,  Orthopnea, PND, swelling in lower  extremities, anasarca, dizziness, palpitations, syncope.   GI  No heartburn, indigestion, abdominal pain, nausea, vomiting, diarrhea, change in bowel habits, loss of appetite, bloody stools.   Resp:   No chest wall deformity  Skin: no rash or lesions.  GU: no dysuria, change in color of urine, no urgency or frequency.  No flank pain, no hematuria   MS:  No joint pain or swelling.  No decreased range of motion.  No back pain.    Physical Exam  BP 108/78 (BP Location: Left Arm, Cuff Size: Normal)   Pulse 91   Temp 97.7 F (36.5 C) (Oral)   Ht 5' 4.25" (1.632 m)  Wt 235 lb (106.6 kg)   LMP 02/14/2014   SpO2 100%   BMI 40.02 kg/m   GEN: A/Ox3; pleasant , NAD, obese    HEENT:  [place]/AT,  EACs-clear, TMs-wnl, NOSE-mild erythema , max sinus pressure , THROAT-clear, no lesions, no postnasal drip or exudate noted.   NECK:  Supple w/ fair ROM; no JVD; normal carotid impulses w/o bruits; no thyromegaly or nodules palpated; no lymphadenopathy.    RESP  Clear  P & A; w/o, wheezes/ rales/ or rhonchi. no accessory muscle use, no dullness to percussion  CARD:  RRR, no m/r/g, no peripheral edema, pulses intact, no cyanosis or clubbing.  GI:   Soft & nt; nml bowel sounds; no organomegaly or masses detected.   Musco: Warm bil, no deformities or joint swelling noted.   Neuro: alert, no focal deficits noted.    Skin: Warm, no lesions or rashes    Lab Results:  CBC    Component Value Date/Time   WBC 6.7 07/23/2016 1156   RBC 4.04 07/23/2016 1156   HGB 12.2 07/23/2016 1156   HCT 36.7 07/23/2016 1156   PLT 270.0 07/23/2016 1156   MCV 90.8 07/23/2016 1156   MCH 28.1 03/01/2016 1740   MCHC 33.2 07/23/2016 1156   RDW 13.8 07/23/2016 1156   LYMPHSABS 1.0 07/23/2016 1156   MONOABS 0.4 07/23/2016 1156   EOSABS 0.1 07/23/2016 1156   BASOSABS 0.0 07/23/2016 1156    BMET    Component Value Date/Time   NA 140 07/23/2016 1156   K 4.0 07/23/2016 1156   CL 101 07/23/2016 1156   CO2  33 (H) 07/23/2016 1156   GLUCOSE 90 07/23/2016 1156   BUN 12 07/23/2016 1156   CREATININE 0.83 07/23/2016 1156   CALCIUM 9.6 07/23/2016 1156   GFRNONAA >60 03/01/2016 1740   GFRAA >60 03/01/2016 1740    BNP No results found for: BNP  ProBNP No results found for: PROBNP  Imaging: No results found.   Assessment & Plan:   Mild persistent asthma Flare with sinusitis   Plan  Patient Instructions  Augmentin '875mg'$  .Twice daily  For 10 days , take w/ food, eat yogurt.. Can try probiotic while taking .  Mucinex DM Twice daily  As needed  Cough/congestion  Saline nasal rinses As needed   Fluids, rest and tylenol .  Please contact office for sooner follow up if symptoms do not improve or worsen or seek emergency care  follow up with Dr. Halford Chessman  In 3 months and As needed      Acute sinusitis Flare   Plan  Patient Instructions  Augmentin '875mg'$  .Twice daily  For 10 days , take w/ food, eat yogurt.. Can try probiotic while taking .  Mucinex DM Twice daily  As needed  Cough/congestion  Saline nasal rinses As needed   Fluids, rest and tylenol .  Please contact office for sooner follow up if symptoms do not improve or worsen or seek emergency care  follow up with Dr. Halford Chessman  In 3 months and As needed         Rexene Edison, NP 04/19/2017

## 2017-04-19 NOTE — Assessment & Plan Note (Signed)
Flare   Plan  Patient Instructions  Augmentin 875mg  .Twice daily  For 10 days , take w/ food, eat yogurt.. Can try probiotic while taking .  Mucinex DM Twice daily  As needed  Cough/congestion  Saline nasal rinses As needed   Fluids, rest and tylenol .  Please contact office for sooner follow up if symptoms do not improve or worsen or seek emergency care  follow up with Dr. Halford Chessman  In 3 months and As needed

## 2017-04-19 NOTE — Patient Instructions (Signed)
Augmentin 875mg  .Twice daily  For 10 days , take w/ food, eat yogurt.. Can try probiotic while taking .  Mucinex DM Twice daily  As needed  Cough/congestion  Saline nasal rinses As needed   Fluids, rest and tylenol .  Please contact office for sooner follow up if symptoms do not improve or worsen or seek emergency care  follow up with Dr. Halford Chessman  In 3 months and As needed

## 2017-04-22 ENCOUNTER — Telehealth: Payer: Self-pay | Admitting: Adult Health

## 2017-04-22 NOTE — Telephone Encounter (Signed)
Augmentin should take care of bronchitis - but it can also cause diarrhea.  Can try probiotic and yogurt to help  If not better will need to be seen  And get cxr .  Marland KitchenER if worse.  Tylenol Maintain hydration by drinking small amounts of clear fluids frequently, then soft diet, and then advance diet as tolerated. May use OTC Imodium if desired for any diarrhea.  Call if symptoms worsen, high fever, severe weakness or fainting, increased abdominal pain, blood in stool or vomit, or failure to improve in 2-3 days.  Please contact office for sooner follow up if symptoms do not improve or worsen or seek emergency care

## 2017-04-22 NOTE — Telephone Encounter (Signed)
lmomtcb x1 

## 2017-04-22 NOTE — Telephone Encounter (Signed)
TP  Please Advise-  You saw this pt on 04/19/17 and pt states she was told to call if she was still not better after a couple of days. Pt is c/o diarrhea for the past couples of days so bad she has needed a depend. She is coughing a lot with greenish mucus, body aches,headache,she states she had a  temp of 100.4 yesterday, she has some body aches, she states her breathing is ok. She states she is still on the Augmentin. She is unsure of what she should do.  Allergies  Allergen Reactions  . Aspirin     REACTION: hives  . Ibuprofen     Upsets stomach  . Latex     REACTION: rash

## 2017-04-25 ENCOUNTER — Telehealth: Payer: Self-pay | Admitting: Internal Medicine

## 2017-04-25 MED ORDER — AZITHROMYCIN 250 MG PO TABS: ORAL_TABLET | ORAL | 0 refills | Status: DC

## 2017-04-25 NOTE — Telephone Encounter (Signed)
Spoke with pt and she stated she ended up reaching out to her PCP and they informed her to not take the augmentin and they will prescribe something else for her. They are working with the pt and are going to see her if her treatment options they put together has not worked. She had no further questions at this time. Nothing further is needed

## 2017-04-25 NOTE — Telephone Encounter (Signed)
Patient called in stating that she was seen in pulmonary on 5/15 to be treated for a sinus infection.  States the side effects of Augmentin caused diarrhea and yeast infection.  Patient states she can not even drink water while taking this medication b/c it goes right through her and she is not getting any sleep. States she called pulmonary in regard and was told to take probiotics.  Patient states this is not working.  Would like to know if Dr. Quay Burow could prescribe her a different antibiotic and something for yeast infection.  I did tell patient she may need to come in to be seen but we could start with phone note.  Please follow up with patient in regard.

## 2017-04-25 NOTE — Telephone Encounter (Signed)
rx pending - not sure what pharmacy

## 2017-04-25 NOTE — Telephone Encounter (Signed)
Pt would like something for a yeast infection as well. Please advise and I will send in

## 2017-04-25 NOTE — Telephone Encounter (Signed)
Stop augmentin Start zpak - send to preferred pharmacy Continue probiotic If no improvement - needs to be seen

## 2017-04-26 MED ORDER — FLUCONAZOLE 150 MG PO TABS: 150.0000 mg | ORAL_TABLET | Freq: Once | ORAL | 0 refills | Status: AC

## 2017-05-04 ENCOUNTER — Other Ambulatory Visit: Payer: Self-pay | Admitting: Emergency Medicine

## 2017-05-04 MED ORDER — HYDROCORTISONE 2.5 % RE CREA: TOPICAL_CREAM | RECTAL | 2 refills | Status: DC

## 2017-05-06 ENCOUNTER — Ambulatory Visit (HOSPITAL_COMMUNITY)
Admission: RE | Admit: 2017-05-06 | Discharge: 2017-05-06 | Disposition: A | Discharge status: Home / Self Care | Payer: Medicare Other | Source: Ambulatory Visit | Attending: Internal Medicine | Admitting: Internal Medicine

## 2017-05-06 ENCOUNTER — Other Ambulatory Visit: Payer: Self-pay | Admitting: Cardiology

## 2017-05-06 DIAGNOSIS — R0602 Shortness of breath: Secondary | ICD-10-CM | POA: Diagnosis not present

## 2017-05-06 MED ORDER — DOBUTAMINE INFUSION FOR EP/ECHO/NUC (1000 MCG/ML)
0.0000 ug/kg/min | INTRAVENOUS | Status: DC
  Administered 2017-05-06: 20 ug/kg/min via INTRAVENOUS
  Filled 2017-05-06: qty 250

## 2017-05-06 NOTE — Progress Notes (Signed)
   Cynthia Kirk presented for a dobutamine echo stress test today.  Stress imaging is pending at this time.  Preliminary EKG findings may be listed in the chart, but the stress test result will not be finalized until perfusion imaging is complete.  Target HR reached at end of 30 mcg/kg/min rate. Infusion held and images obtained.  Pt felt her heart beating very hard, up in her throat, but did not have chest pain. ECG w/ some ST changes, no ST elevation. Dr Aundra Dubin to review.  After DBA d/c'd, pt BP was noted to be low, 90/50, w/ HR 128. Palpated lower, but reliability unclear. Pt had not had much to eat/drink today.   750 cc IVF NS given. Pt BP stabilized in the 90s, pretest BP 104/60. Dr Aundra Dubin to read,   Cynthia Kirk, Cynthia Kirk 05/06/2017, 3:44 PM

## 2017-05-08 ENCOUNTER — Other Ambulatory Visit: Payer: Self-pay | Admitting: Internal Medicine

## 2017-06-04 DIAGNOSIS — M1712 Unilateral primary osteoarthritis, left knee: Secondary | ICD-10-CM | POA: Diagnosis not present

## 2017-06-04 DIAGNOSIS — M1711 Unilateral primary osteoarthritis, right knee: Secondary | ICD-10-CM | POA: Diagnosis not present

## 2017-06-04 DIAGNOSIS — M170 Bilateral primary osteoarthritis of knee: Secondary | ICD-10-CM | POA: Diagnosis not present

## 2017-07-21 ENCOUNTER — Other Ambulatory Visit: Payer: Self-pay | Admitting: Internal Medicine

## 2017-07-25 ENCOUNTER — Other Ambulatory Visit: Payer: Self-pay | Admitting: Emergency Medicine

## 2017-07-25 MED ORDER — ALPRAZOLAM 1 MG PO TABS: 1.0000 mg | ORAL_TABLET | Freq: Two times a day (BID) | ORAL | 0 refills | Status: DC | PRN

## 2017-07-25 NOTE — Telephone Encounter (Signed)
Refill request from [facility] for Xanax. [place] Controlled Substance Database checked. Last filled on 03/24/17

## 2017-07-26 ENCOUNTER — Ambulatory Visit: Payer: Medicare Other | Admitting: Pulmonary Disease

## 2017-07-26 DIAGNOSIS — M5412 Radiculopathy, cervical region: Secondary | ICD-10-CM | POA: Diagnosis not present

## 2017-07-26 DIAGNOSIS — J300 Vasomotor rhinitis: Secondary | ICD-10-CM | POA: Insufficient documentation

## 2017-07-26 DIAGNOSIS — M9981 Other biomechanical lesions of cervical region: Secondary | ICD-10-CM | POA: Diagnosis not present

## 2017-07-26 NOTE — Telephone Encounter (Signed)
[facility] faxed to [facility]

## 2017-07-27 DIAGNOSIS — Z6841 Body Mass Index (BMI) 40.0 and over, adult: Secondary | ICD-10-CM | POA: Diagnosis not present

## 2017-07-27 DIAGNOSIS — M5412 Radiculopathy, cervical region: Secondary | ICD-10-CM | POA: Diagnosis not present

## 2017-07-27 DIAGNOSIS — M9981 Other biomechanical lesions of cervical region: Secondary | ICD-10-CM | POA: Diagnosis not present

## 2017-07-27 DIAGNOSIS — I10 Essential (primary) hypertension: Secondary | ICD-10-CM | POA: Diagnosis not present

## 2017-08-05 ENCOUNTER — Other Ambulatory Visit: Payer: Self-pay | Admitting: Physical Medicine and Rehabilitation

## 2017-08-05 ENCOUNTER — Emergency Department (HOSPITAL_COMMUNITY)
Admission: EM | Admit: 2017-08-05 | Discharge: 2017-08-06 | Disposition: A | Discharge status: Home / Self Care | Payer: Medicare Other | Attending: Emergency Medicine | Admitting: Emergency Medicine

## 2017-08-05 ENCOUNTER — Encounter (HOSPITAL_COMMUNITY): Payer: Self-pay | Admitting: *Deleted

## 2017-08-05 DIAGNOSIS — G8929 Other chronic pain: Secondary | ICD-10-CM | POA: Insufficient documentation

## 2017-08-05 DIAGNOSIS — M25511 Pain in right shoulder: Secondary | ICD-10-CM | POA: Insufficient documentation

## 2017-08-05 DIAGNOSIS — M4802 Spinal stenosis, cervical region: Secondary | ICD-10-CM

## 2017-08-05 DIAGNOSIS — E119 Type 2 diabetes mellitus without complications: Secondary | ICD-10-CM | POA: Diagnosis not present

## 2017-08-05 DIAGNOSIS — S46021A Laceration of muscle(s) and tendon(s) of the rotator cuff of right shoulder, initial encounter: Secondary | ICD-10-CM | POA: Diagnosis not present

## 2017-08-05 DIAGNOSIS — M542 Cervicalgia: Secondary | ICD-10-CM | POA: Diagnosis present

## 2017-08-05 DIAGNOSIS — M12811 Other specific arthropathies, not elsewhere classified, right shoulder: Secondary | ICD-10-CM | POA: Diagnosis not present

## 2017-08-05 DIAGNOSIS — M7551 Bursitis of right shoulder: Secondary | ICD-10-CM | POA: Diagnosis not present

## 2017-08-05 DIAGNOSIS — Z79899 Other long term (current) drug therapy: Secondary | ICD-10-CM | POA: Insufficient documentation

## 2017-08-05 DIAGNOSIS — I10 Essential (primary) hypertension: Secondary | ICD-10-CM | POA: Diagnosis not present

## 2017-08-05 DIAGNOSIS — Z91040 Latex allergy status: Secondary | ICD-10-CM | POA: Insufficient documentation

## 2017-08-05 DIAGNOSIS — M75101 Unspecified rotator cuff tear or rupture of right shoulder, not specified as traumatic: Secondary | ICD-10-CM

## 2017-08-05 MED ORDER — FENTANYL CITRATE (PF) 100 MCG/2ML IJ SOLN
INTRAMUSCULAR | Status: AC
  Administered 2017-08-05: 50 ug via NASAL
  Filled 2017-08-05: qty 2

## 2017-08-05 MED ORDER — FENTANYL CITRATE (PF) 100 MCG/2ML IJ SOLN
50.0000 ug | INTRAMUSCULAR | Status: DC | PRN
  Administered 2017-08-05: 50 ug via NASAL

## 2017-08-05 MED ORDER — TRAMADOL HCL 50 MG PO TABS
50.0000 mg | ORAL_TABLET | Freq: Once | ORAL | Status: AC
Start: 2017-08-05 — End: 2017-08-05
  Administered 2017-08-05: 50 mg via ORAL
  Filled 2017-08-05: qty 1

## 2017-08-05 NOTE — ED Triage Notes (Signed)
Pt reports having right side neck pain that radiates into right shoulder and arm x 1 month. Hx of same in past but occurred in left arm. Went to neurosurgeon and had injections done over a week ago with no relief.

## 2017-08-05 NOTE — ED Provider Notes (Signed)
[facility] DEPT Provider Note   CSN: [id number] Arrival date & time: 08/05/17  1413     History   Chief Complaint Chief Complaint  Patient presents with  . Neck Pain  . Arm Pain    HPI Cynthia Kirk is a 66 y.o. female.  HPI   66 year old female with history of chronic back pain, obesity, panic attack, diabetes, sarcoidosis presenting complaining of neck pain and shoulder pain. For more than a month patient has had persistent pain throughout her neck that radiates down her right shoulder and arm. Expressing and needle sensation to her right forearm and hands sometime with movement. The pain is 10 out of 10 mildly improved with over-the-counter Tylenol and ibuprofen. Pain is becoming progressively worse. She does have a neurosurgeon that she follow-up for her pain. Last year she has similar symptoms to the left arm she received 2 injections that resolved her pain. This year she is having similar pain to her right arm except much more intense. Her neurosurgeon wants to do a nerve conduction study of her right arm but she was told that she needed an MRI. Her MRI appointment is in 2 weeks however the pain is quite intense today and she was told by the office to come to the ER for further management. Patient is ambidextrous. She reported her pain is now affecting her activities of daily living. She is here requesting for further care. She denies having fever, chills, severe headache, chest pain, or trouble .  Patient also mentions several years prior she was having right shoulder pain and had an MRI and was told that she had a rotator cuff tear. She did not seek surgical intervention at that time.  She did received an injection by her neurosurgeon a week ago without relief.    Past Medical History:  Diagnosis Date  . Abnormal Pap smear 2008  . Adenomatous colon polyp   . Allergic rhinitis, cause unspecified   . Anxiety   . ASCUS with positive high risk HPV   . Blood transfusion 1976   Due to ectopic pregnancy  . Chronic back pain   . Ejection fraction    EF 45-50%, echo, October 27, 2011  . Endometrial mass 2011  . Esophageal reflux   . Fibroid   . H/O Clostridium difficile infection 03/2010  . H/O menorrhagia 2011  . Herpes simplex type 2 infection complicating pregnancy   . Hiatal hernia   . Iron deficiency anemia   . Morbid obesity ([place]) 2010  . Obstructive sleep apnea (adult) (pediatric)    cpap  . Panic attacks   . Pneumonia 2005; 2007; 2009; 2011  . Sarcoidosis 1996   @ [employer]  . Simple endometrial hyperplasia 2011  . Spinal stenosis, lumbar    s/p decompression laminectomy 09/2013  . Syncope    ?? Syncope ??  . Type 2 diabetes, diet controlled ([place])   . Unspecified asthma(493.90)   . Urinary incontinence, mixed 2011  . VIN II (vulvar intraepithelial neoplasia II)     Patient Active Problem List   Diagnosis Date Noted  . Acute sinusitis 04/19/2017  . Anxiety 02/01/2017  . Viral upper respiratory tract infection 02/01/2017  . Acute pain of left shoulder 02/01/2017  . Left-sided chest wall pain 08/31/2016  . Multiple contusions 08/31/2016  . Renal cyst 07/23/2016  . Situational depression 06/24/2016  . Cervical disc disorder with radiculopathy of cervical region 03/13/2016  . B12 deficiency   . Cataract 02/25/2014  . Glaucoma suspect  02/25/2014  . Lumbar spinal stenosis 03/07/2013  . Ejection fraction   . Syncope   . Severe obesity (BMI >= 40) ([place]) 09/14/2010  . Essential hypertension 09/14/2010  . ANEMIA-IRON DEFICIENCY 03/20/2010  . Menopausal and postmenopausal disorder 03/20/2010  . GERD 08/22/2009  . Pulmonary sarcoidosis ([place]) 06/30/2009  . Diabetes mellitus ([place]) 06/30/2009  . OSA on CPAP 06/30/2009  . Upper airway cough syndrome 06/30/2009  . Mild persistent asthma 06/30/2009    Past Surgical History:  Procedure Laterality Date  . COLPOSCOPY VULVA W/ BIOPSY     "I've had maybe 3" (09/05/2013)  . DILATION AND CURETTAGE OF  UTERUS  2007  . [place]; ~ 1978  . HYSTEROSCOPY DIAGNOSTIC  2011  . LUMBAR LAMINECTOMY/DECOMPRESSION MICRODISCECTOMY N/A 09/05/2013   Procedure: Thoracic eleven-twelve Thoracic Laminectomy;  Surgeon: Eustace Moore, MD;  Location: [facility];  Service: Neurosurgery;  Laterality: N/A;  Thoracic eleven-twelve Thoracic Laminectomy  . MAXIMUM ACCESS (MAS)POSTERIOR LUMBAR INTERBODY FUSION (PLIF) 1 LEVEL N/A 09/05/2013   Procedure: Lumbar four-five Maximum Access Surgery  Posterior lumbar interbody fusion;  Surgeon: Eustace Moore, MD;  Location: [facility];  Service: Neurosurgery;  Laterality: N/A;  Lumbar four-five Maximum Access Surgery  Posterior lumbar interbody fusion  . POSTERIOR LAMINECTOMY / DECOMPRESSION LUMBAR SPINE  09/05/2013  . POSTERIOR LUMBAR FUSION  09/05/2013  . THORACIC LAMINECTOMY  09/05/2013  . URETHRAL SLING  2011  . VULVECTOMY  ~ 2007   "for cancerous cells" (09/05/2013)    OB History    Gravida Para Term Preterm AB Living   8         2   SAB TAB Ectopic Multiple Live Births                   Home Medications    Prior to Admission medications   Medication Sig Start Date End Date Taking? Authorizing Provider  albuterol (PROVENTIL HFA;VENTOLIN HFA) 108 (90 BASE) MCG/ACT inhaler Inhale 2 puffs into the lungs every 6 (six) hours as needed for wheezing or shortness of breath.    [provider]  albuterol (PROVENTIL) (2.5 MG/3ML) 0.083% nebulizer solution Take 3 mLs (2.5 mg total) by nebulization every 6 (six) hours as needed for shortness of breath. 08/15/15   Chesley Mires, MD  ALPRAZolam Duanne Moron) 1 MG tablet Take 1 tablet (1 mg total) by mouth 2 (two) times daily as needed. 07/25/17   [name], MD  azithromycin (ZITHROMAX) 250 MG tablet Take two tabs the first day and then one tab daily for four days 04/25/17   [name], MD  BD PEN NEEDLE NANO U/F 32G X 4 MM MISC USE 3 TIMES DAILY AS DIRECTED 05/09/17   [name], MD  Blood Glucose  Monitoring Suppl (ONE TOUCH ULTRA SYSTEM KIT) w/Device KIT Test blood sugars daily. DX E11.9 02/16/16   [name], MD  cefdinir (OMNICEF) 300 MG capsule Take 1 capsule (300 mg total) by mouth 2 (two) times daily. 02/08/17   [name], MD  clotrimazole-betamethasone (LOTRISONE) cream Apply topically 2 (two) times daily. 07/23/16   [name], MD  Coenzyme Q10 (COQ10) 200 MG CAPS Take 1 capsule by mouth daily.    [provider]  cyclobenzaprine (FLEXERIL) 10 MG tablet TAKE 0.5-1 TABLETS (5-10 MG TOTAL) BY MOUTH 3 (THREE) TIMES DAILY AS NEEDED FOR MUSCLE SPASMS. 12/24/16   [name], MD  dexlansoprazole (DEXILANT) 60 MG capsule Take 1 capsule (  60 mg total) by mouth daily. 04/06/17   Ladene Artist, MD  ESTRACE VAGINAL 0.1 MG/GM vaginal cream  06/23/16   [provider]  gabapentin (NEURONTIN) 100 MG capsule TAKE 1 CAPSULE AT BEDTIME 10/11/16   Hyatt, Max T, DPM  glucose blood (ONE TOUCH ULTRA TEST) test strip Use to check blood sugars twice a day Dx: E11.9 02/16/16   Burns, Claudina Lick, MD  hydrocortisone (PROCTOSOL HC) 2.5 % rectal cream APPLY RECTALLY 2 (TWO) TIMES DAILY. 05/04/17   [name], MD  hydroquinone 4 % cream Apply topically 2 (two) times daily. 12/24/16   [name], MD  Lancets [facility] ULTRASOFT) lancets Use to check blood sugar twice a day. Dx E11.9 02/16/16   [name], MD  Liraglutide (VICTOZA) 18 MG/3ML SOPN INJECT 1.8 MG SQ DAILY 08/10/16   Burns, Claudina Lick, MD  mometasone (ASMANEX 60 METERED DOSES) 220 MCG/INH inhaler INHALE 2 PUFFS INTO THE LUNGS DAILY. 02/17/16   Burns, Claudina Lick, MD  montelukast (SINGULAIR) 10 MG tablet TAKE 1 TABLET (10 MG TOTAL) BY MOUTH AT BEDTIME. 08/10/16   Burns, Claudina Lick, MD  montelukast (SINGULAIR) 10 MG tablet Take 1 tablet (10 mg total) by mouth at bedtime. 07/21/17   [name], MD  nebivolol (BYSTOLIC) 5 MG tablet Take 1 tablet (5 mg total) by mouth daily. 08/10/16   [name], MD  olmesartan-hydrochlorothiazide  ([name] HCT) 40-25 MG tablet Take 1 tablet by mouth daily. 08/10/16   [name], MD  [facility] DELICA LANCETS 01B MISC Use to test blood sugars twice daily. 09/15/16   [name], MD  ranitidine (ZANTAC) 300 MG tablet TAKE 1 TABLET (300 MG TOTAL) BY MOUTH TWICE DAILY 04/06/17   Ladene Artist, MD  VICTOZA 18 MG/3ML SOPN INJECT 1.'8MG'$  SUBCUTANEOUSLYDAILY 04/11/17   [name], MD    Family History Family History  Problem Relation Age of Onset  . Heart attack Father   . Hypertension Father   . Diabetes Father   . Seizures Father   . Migraines Father   . Kidney disease Sister   . Osteoporosis Sister   . Heart attack Sister   . Cancer Paternal Uncle   . Asthma Daughter   . Asthma Son   . Osteoporosis Sister   . Lung cancer Maternal Uncle   . Kidney disease Other   . Diabetes Other   . High Cholesterol Brother        x2    Social History Social History  Substance Use Topics  . Smoking status: Never Smoker  . Smokeless tobacco: Never Used  . Alcohol use Yes     Comment: 2018 - very occasionally     Allergies   Aspirin; Ibuprofen; and Latex   Review of Systems Review of Systems  All other systems reviewed and are negative.    Physical Exam Updated Vital Signs BP 115/72   Pulse 82   Temp (!) 97.4 F (36.3 C) (Oral)   Resp 16   LMP 02/14/2014   SpO2 99%   Physical Exam  Constitutional: She is oriented to person, place, and time. She appears well-developed and well-nourished. No distress.  HENT:  Head: Atraumatic.  Eyes: Conjunctivae are normal.  Neck: Normal range of motion. Neck supple.  Minimal cervical spine or paraspinal tenderness on palpation. Neck with full range of motion  Cardiovascular: Normal rate, regular rhythm and intact distal pulses.   Pulmonary/Chest: Effort normal and breath sounds normal.  Musculoskeletal: She exhibits tenderness (Tenderness to right trapezius, right lower deltoid and posterior scapular region on palpation. Full  range of motion throughout right shoulder with normal grip strength.).  Neurological: She is alert and oriented to person, place, and time.  Skin: No rash noted.  Psychiatric: She has a normal mood and affect.  Nursing note and vitals reviewed.    ED Treatments / Results  Labs (all labs ordered are listed, but only abnormal results are displayed) Labs Reviewed - No data to display  EKG  EKG Interpretation None       Radiology No results found.  Procedures Procedures (including critical care time)  Medications Ordered in ED Medications  fentaNYL (SUBLIMAZE) injection 50 mcg (50 mcg Nasal Given 08/05/17 1830)     Initial Impression / Assessment and Plan / ED Course  I have reviewed the triage vital signs and the nursing notes.  Pertinent labs & imaging results that were available during my care of the patient were reviewed by me and considered in my medical decision making (see chart for details).     BP 108/67   Pulse 71   Temp (!) 97.4 F (36.3 C) (Oral)   Resp 16   LMP 02/14/2014   SpO2 100%    Final Clinical Impressions(s) / ED Diagnoses   Final diagnoses:  Chronic right shoulder pain  Rotator cuff tear arthropathy of right shoulder    New Prescriptions New Prescriptions   TRAMADOL (ULTRAM) 50 MG TABLET    Take 1 tablet (50 mg total) by mouth every 6 (six) hours as needed.   9:59 PM Patient report having progressive worsening radicular pain of the right shoulder and right neck. She has been seen and managed by a neurosurgeon for this before and has received injections for in the past. He did schedule a right shoulder MRI in 2 weeks. Giving increasing pain and the fact that patient needed an MRI before she can have a nerve conduction test, I will order the same MRI here in the ER. This is atypical for ACS. Low suspicion for true cord compression as pt has normal strength and appears NVI.   3:08 AM MRI of R shoulder showing partial thickness tear of the  undersurface of the supra spinatous tendon.  AC joint DJD, and a small cyst in the proximal humerus.  I discussed this finding both with the radiologist and with pt.  She can f/u with her neurosurgeon Dr. Brien Few for further management.  Tramadol prescribed for pain.  In order to decrease risk of narcotic abuse. Pt's record were checked using the [place] Controlled Substance database.     Domenic Moras, PA-C 08/06/17 [place], MD 08/12/17 [phone]

## 2017-08-06 ENCOUNTER — Emergency Department (HOSPITAL_COMMUNITY): Payer: Medicare Other

## 2017-08-06 DIAGNOSIS — M12811 Other specific arthropathies, not elsewhere classified, right shoulder: Secondary | ICD-10-CM | POA: Diagnosis not present

## 2017-08-06 DIAGNOSIS — M7551 Bursitis of right shoulder: Secondary | ICD-10-CM | POA: Diagnosis not present

## 2017-08-06 MED ORDER — TRAMADOL HCL 50 MG PO TABS: 50.0000 mg | ORAL_TABLET | Freq: Four times a day (QID) | ORAL | 0 refills | Status: DC | PRN

## 2017-08-06 MED ORDER — HYDROMORPHONE HCL 1 MG/ML IJ SOLN
1.0000 mg | Freq: Once | INTRAMUSCULAR | Status: DC
  Filled 2017-08-06: qty 1

## 2017-08-06 MED ORDER — LORAZEPAM 1 MG PO TABS: 1.0000 mg | ORAL_TABLET | Freq: Once | ORAL | Status: DC

## 2017-08-06 MED ORDER — HYDROMORPHONE HCL 1 MG/ML IJ SOLN
1.0000 mg | Freq: Once | INTRAMUSCULAR | Status: AC
  Administered 2017-08-06: 1 mg via INTRAMUSCULAR

## 2017-08-06 NOTE — ED Notes (Signed)
Pt remains in MRI 

## 2017-08-06 NOTE — ED Notes (Signed)
PT states understanding of care given, follow up care, and medication prescribed. PT ambulated from ED to car with a steady gait. 

## 2017-08-06 NOTE — Discharge Instructions (Signed)
You have been evaluated for your right shoulder pain. MRI demonstrates a  partial thickness tear of the undersurface of the supra spinatous tendon.  AC joint with degenerative joint disease, and a small cyst in the proximal humerus.  Please follow up with Dr. Brien Few for further care.  Take tramadol as needed for pain.  Do not wear sling for a prolonged period of time as it may cause frozen shoulder.

## 2017-08-06 NOTE — ED Notes (Signed)
Patient transported to MRI 

## 2017-08-16 ENCOUNTER — Encounter: Payer: Self-pay | Admitting: Internal Medicine

## 2017-08-16 ENCOUNTER — Encounter: Payer: Medicare Other | Admitting: Internal Medicine

## 2017-08-16 ENCOUNTER — Ambulatory Visit (INDEPENDENT_AMBULATORY_CARE_PROVIDER_SITE_OTHER): Payer: Medicare Other | Admitting: Internal Medicine

## 2017-08-16 ENCOUNTER — Other Ambulatory Visit (INDEPENDENT_AMBULATORY_CARE_PROVIDER_SITE_OTHER): Payer: Medicare Other

## 2017-08-16 ENCOUNTER — Other Ambulatory Visit: Payer: Medicare Other

## 2017-08-16 VITALS — BP 144/86 | HR 88 | Temp 98.2°F | Resp 16 | Wt 237.0 lb

## 2017-08-16 DIAGNOSIS — M25511 Pain in right shoulder: Secondary | ICD-10-CM | POA: Insufficient documentation

## 2017-08-16 DIAGNOSIS — Z23 Encounter for immunization: Secondary | ICD-10-CM

## 2017-08-16 DIAGNOSIS — I10 Essential (primary) hypertension: Secondary | ICD-10-CM

## 2017-08-16 DIAGNOSIS — K219 Gastro-esophageal reflux disease without esophagitis: Secondary | ICD-10-CM

## 2017-08-16 DIAGNOSIS — E119 Type 2 diabetes mellitus without complications: Secondary | ICD-10-CM | POA: Diagnosis not present

## 2017-08-16 DIAGNOSIS — F419 Anxiety disorder, unspecified: Secondary | ICD-10-CM | POA: Diagnosis not present

## 2017-08-16 DIAGNOSIS — M5412 Radiculopathy, cervical region: Secondary | ICD-10-CM | POA: Diagnosis not present

## 2017-08-16 DIAGNOSIS — R202 Paresthesia of skin: Secondary | ICD-10-CM | POA: Diagnosis not present

## 2017-08-16 DIAGNOSIS — M9981 Other biomechanical lesions of cervical region: Secondary | ICD-10-CM | POA: Diagnosis not present

## 2017-08-16 LAB — CBC WITH DIFFERENTIAL/PLATELET
BASOS ABS: 0 10*3/uL (ref 0.0–0.1)
Basophils Relative: 0.6 % (ref 0.0–3.0)
EOS PCT: 2.2 % (ref 0.0–5.0)
Eosinophils Absolute: 0.1 10*3/uL (ref 0.0–0.7)
HCT: 36.9 % (ref 36.0–46.0)
HEMOGLOBIN: 12.1 g/dL (ref 12.0–15.0)
LYMPHS ABS: 1.7 10*3/uL (ref 0.7–4.0)
Lymphocytes Relative: 28.5 % (ref 12.0–46.0)
MCHC: 32.7 g/dL (ref 30.0–36.0)
MCV: 90.5 fl (ref 78.0–100.0)
MONO ABS: 0.4 10*3/uL (ref 0.1–1.0)
MONOS PCT: 7.5 % (ref 3.0–12.0)
NEUTROS PCT: 61.2 % (ref 43.0–77.0)
Neutro Abs: 3.5 10*3/uL (ref 1.4–7.7)
Platelets: 253 10*3/uL (ref 150.0–400.0)
RBC: 4.08 Mil/uL (ref 3.87–5.11)
RDW: 14.5 % (ref 11.5–15.5)
WBC: 5.8 10*3/uL (ref 4.0–10.5)

## 2017-08-16 LAB — COMPREHENSIVE METABOLIC PANEL
ALBUMIN: 3.7 g/dL (ref 3.5–5.2)
ALK PHOS: 86 U/L (ref 39–117)
ALT: 8 U/L (ref 0–35)
AST: 16 U/L (ref 0–37)
BUN: 19 mg/dL (ref 6–23)
CO2: 32 mEq/L (ref 19–32)
CREATININE: 0.93 mg/dL (ref 0.40–1.20)
Calcium: 9.4 mg/dL (ref 8.4–10.5)
Chloride: 98 mEq/L (ref 96–112)
GFR: 77.45 mL/min (ref >60.00)
Glucose, Bld: 89 mg/dL (ref 70–99)
Potassium: 3.7 mEq/L (ref 3.5–5.1)
SODIUM: 138 meq/L (ref 135–145)
TOTAL PROTEIN: 6.8 g/dL (ref 6.0–8.3)
Total Bilirubin: 0.5 mg/dL (ref 0.2–1.2)

## 2017-08-16 LAB — HEMOGLOBIN A1C: HEMOGLOBIN A1C: 5.3 % (ref 4.6–6.5)

## 2017-08-16 LAB — LIPID PANEL
CHOL/HDL RATIO: 4
Cholesterol: 178 mg/dL (ref 0–200)
HDL: 46.2 mg/dL (ref >39.00)
LDL Cholesterol: 111 mg/dL — ABNORMAL HIGH (ref 0–99)
NONHDL: 131.8
Triglycerides: 104 mg/dL (ref 0.0–149.0)
VLDL: 20.8 mg/dL (ref 0.0–40.0)

## 2017-08-16 LAB — TSH: TSH: 1.77 u[IU]/mL (ref 0.35–4.50)

## 2017-08-16 MED ORDER — HYDROQUINONE 4 % EX CREA: TOPICAL_CREAM | Freq: Two times a day (BID) | CUTANEOUS | 2 refills | Status: DC

## 2017-08-16 MED ORDER — HYDROCODONE-ACETAMINOPHEN 10-325 MG PO TABS: 1.0000 | ORAL_TABLET | Freq: Four times a day (QID) | ORAL | 0 refills | Status: DC | PRN

## 2017-08-16 NOTE — Assessment & Plan Note (Signed)
Blood pressure slightly elevated here today, but typically controlled She is in significant pain, which is likely elevated blood pressure Continue current medications at current doses CMP, TSH, CBC

## 2017-08-16 NOTE — Progress Notes (Signed)
Subjective:    Patient ID: Cynthia Kirk, female    DOB: [birth year], 66 y.o.   MRN: [id number]  HPI The patient is here for follow up.  Right shoulder, upper back and neck painRecently she has been experiencing right shoulder, upper back and neck pain. She did see her back specialist and he tried giving her some injections in her neck, but it did not help. She does have a history of cervical radiculopathy on the left and they're hoping these injections would help. There is a possibility the pain is coming from the shoulder and she will be seen in orthopedics for further evaluation and treatment. She is in significant pain and would like to get something for the pain and possible to provide some relief until she is able to see orthopedics. She describes her pain as being severe now.    Hypertension: She is taking her medication daily. She is compliant with a low sodium diet.  She denies chest pain, palpitations, edema, shortness of breath and regular headaches. She is not currently exercising.  She does not monitor her blood pressure at home.    Diabetes: She is taking the victoza daily.  She feels it is helping with her weight.  She is compliant with a diabetic diet. She is not exercising regularly. She checks her feet daily and denies foot lesions. She is up-to-date with an ophthalmology examination.   GERD:  She is taking her medication daily as prescribed.  She denies any GERD symptoms and feels her GERD is well controlled.   Anxiety: She is taking her medication daily as prescribed. She denies any side effects from the medication. She feels her anxiety is well controlled and she is happy with her current dose of medication.   Obesity:  She is taking victoza for her sugars and it is helping with her weight.  she feels it is helping to control her eating.   Medications and allergies reviewed with patient and updated if appropriate.  Patient Active Problem List   Diagnosis Date Noted  .  Acute sinusitis 04/19/2017  . Anxiety 02/01/2017  . Acute pain of left shoulder 02/01/2017  . Renal cyst 07/23/2016  . Situational depression 06/24/2016  . Cervical disc disorder with radiculopathy of cervical region 03/13/2016  . B12 deficiency   . Cataract 02/25/2014  . Glaucoma suspect 02/25/2014  . Lumbar spinal stenosis 03/07/2013  . Ejection fraction   . Syncope   . Severe obesity (BMI >= 40) ([place]) 09/14/2010  . Essential hypertension 09/14/2010  . ANEMIA-IRON DEFICIENCY 03/20/2010  . GERD 08/22/2009  . Pulmonary sarcoidosis ([place]) 06/30/2009  . Diabetes mellitus ([place]) 06/30/2009  . OSA on CPAP 06/30/2009  . Upper airway cough syndrome 06/30/2009  . Mild persistent asthma 06/30/2009    Current Outpatient Prescriptions on File Prior to Visit  Medication Sig Dispense Refill  . albuterol (PROVENTIL HFA;VENTOLIN HFA) 108 (90 BASE) MCG/ACT inhaler Inhale 2 puffs into the lungs every 6 (six) hours as needed for wheezing or shortness of breath.    [name] albuterol (PROVENTIL) (2.5 MG/3ML) 0.083% nebulizer solution Take 3 mLs (2.5 mg total) by nebulization every 6 (six) hours as needed for shortness of breath. 75 mL 5  . ALPRAZolam (XANAX) 1 MG tablet Take 1 tablet (1 mg total) by mouth 2 (two) times daily as needed. 180 tablet 0  . Coenzyme Q10 (COQ10) 200 MG CAPS Take 1 capsule by mouth daily.    . cyclobenzaprine (FLEXERIL) 10 MG tablet TAKE 0.5-1  TABLETS (5-10 MG TOTAL) BY MOUTH 3 (THREE) TIMES DAILY AS NEEDED FOR MUSCLE SPASMS. 90 tablet 0  . dexlansoprazole (DEXILANT) 60 MG capsule Take 1 capsule (60 mg total) by mouth daily. 90 capsule 3  . gabapentin (NEURONTIN) 100 MG capsule TAKE 1 CAPSULE AT BEDTIME 30 capsule 7  . ipratropium (ATROVENT) 0.06 % nasal spray Place 2 sprays into the nose daily.    . Liraglutide (VICTOZA) 18 MG/3ML SOPN INJECT 1.8 MG SQ DAILY 9 pen 2  . mometasone (ASMANEX 60 METERED DOSES) 220 MCG/INH inhaler INHALE 2 PUFFS INTO THE LUNGS DAILY. 1 Inhaler 6  .  montelukast (SINGULAIR) 10 MG tablet TAKE 1 TABLET (10 MG TOTAL) BY MOUTH AT BEDTIME. 90 tablet 3  . nebivolol (BYSTOLIC) 5 MG tablet Take 1 tablet (5 mg total) by mouth daily. 90 tablet 3  . olmesartan-hydrochlorothiazide ([name] HCT) 40-25 MG tablet Take 1 tablet by mouth daily. 90 tablet 3  . ranitidine (ZANTAC) 300 MG tablet TAKE 1 TABLET (300 MG TOTAL) BY MOUTH TWICE DAILY 180 tablet 3   No current facility-administered medications on file prior to visit.     Past Medical History:  Diagnosis Date  . Abnormal Pap smear 2008  . Adenomatous colon polyp   . Allergic rhinitis, cause unspecified   . Anxiety   . ASCUS with positive high risk HPV   . Blood transfusion 1976   Due to ectopic pregnancy  . Chronic back pain   . Ejection fraction    EF 45-50%, echo, October 27, 2011  . Endometrial mass 2011  . Esophageal reflux   . Fibroid   . H/O Clostridium difficile infection 03/2010  . H/O menorrhagia 2011  . Herpes simplex type 2 infection complicating pregnancy   . Hiatal hernia   . Iron deficiency anemia   . Morbid obesity ([place]) 2010  . Obstructive sleep apnea (adult) (pediatric)    cpap  . Panic attacks   . Pneumonia 2005; 2007; 2009; 2011  . Sarcoidosis 1996   @ [employer]  . Simple endometrial hyperplasia 2011  . Spinal stenosis, lumbar    s/p decompression laminectomy 09/2013  . Syncope    ?? Syncope ??  . Type 2 diabetes, diet controlled ([place])   . Unspecified asthma(493.90)   . Urinary incontinence, mixed 2011  . VIN II (vulvar intraepithelial neoplasia II)     Past Surgical History:  Procedure Laterality Date  . COLPOSCOPY VULVA W/ BIOPSY     "I've had maybe 3" (09/05/2013)  . DILATION AND CURETTAGE OF UTERUS  2007  . [place]; ~ 1978  . HYSTEROSCOPY DIAGNOSTIC  2011  . LUMBAR LAMINECTOMY/DECOMPRESSION MICRODISCECTOMY N/A 09/05/2013   Procedure: Thoracic eleven-twelve Thoracic Laminectomy;  Surgeon: Eustace Moore, MD;  Location: [facility]UR[facility] ORS;   Service: Neurosurgery;  Laterality: N/A;  Thoracic eleven-twelve Thoracic Laminectomy  . MAXIMUM ACCESS (MAS)POSTERIOR LUMBAR INTERBODY FUSION (PLIF) 1 LEVEL N/A 09/05/2013   Procedure: Lumbar four-five Maximum Access Surgery  Posterior lumbar interbody fusion;  Surgeon: Eustace Moore, MD;  Location: [facility] NEUR[facility] ORS;  Service: Neurosurgery;  Laterality: N/A;  Lumbar four-five Maximum Access Surgery  Posterior lumbar interbody fusion  . POSTERIOR LAMINECTOMY / DECOMPRESSION LUMBAR SPINE  09/05/2013  . POSTERIOR LUMBAR FUSION  09/05/2013  . THORACIC LAMINECTOMY  09/05/2013  . URETHRAL SLING  2011  . VULVECTOMY  ~ 2007   "for cancerous cells" (09/05/2013)    Social History   Social History  . Marital status: Married  Spouse name: N/A  . Number of children: 2  . Years of education: PHD   Occupational History  . retired   .  Unemployed   Social History Main Topics  . Smoking status: Never Smoker  . Smokeless tobacco: Never Used  . Alcohol use Yes     Comment: 2018 - very occasionally  . Drug use: No  . Sexual activity: Yes    Birth control/ protection: Post-menopausal   Other Topics Concern  . Not on file   Social History Narrative   Pt lives at home with her spouse.   She does not use caffeine.    Family History  Problem Relation Age of Onset  . Heart attack Father   . Hypertension Father   . Diabetes Father   . Seizures Father   . Migraines Father   . Kidney disease Sister   . Osteoporosis Sister   . Heart attack Sister   . Cancer Paternal Uncle   . Asthma Daughter   . Asthma Son   . Osteoporosis Sister   . Lung cancer Maternal Uncle   . Kidney disease Other   . Diabetes Other   . High Cholesterol Brother        x2    Review of Systems  Constitutional: Negative for chills and fever.  Respiratory: Negative for cough, shortness of breath and wheezing.   Cardiovascular: Positive for palpitations (occasional). Negative for chest pain and leg swelling.  Endocrine:  Positive for cold intolerance.  Musculoskeletal: Positive for arthralgias and neck pain.  Neurological: Positive for headaches (occasional). Negative for dizziness and light-headedness.       Objective:   Vitals:   08/16/17 1139  BP: (!) 144/86  Pulse: 88  Resp: 16  Temp: 98.2 F (36.8 C)  SpO2: 95%   Wt Readings from Last 3 Encounters:  08/16/17 237 lb (107.5 kg)  05/06/17 235 lb (106.6 kg)  04/19/17 235 lb (106.6 kg)   Body mass index is 40.36 kg/m.   BP Readings from Last 3 Encounters:  08/16/17 (!) 144/86  08/06/17 (!) 112/96  04/19/17 108/78      Physical Exam    Constitutional: Appears well-developed and well-nourished. No distress.  HENT:  Head: Normocephalic and atraumatic.  Neck: Neck supple. No tracheal deviation present. No thyromegaly present.  No cervical lymphadenopathy Cardiovascular: Normal rate, regular rhythm and normal heart sounds.   No murmur heard. No carotid bruit .  No edema Pulmonary/Chest: Effort normal and breath sounds normal. No respiratory distress. No has no wheezes. No rales.  Nsk: pain with movement of right shoulder, neck. Skin: Skin is warm and dry. Not diaphoretic.  Psychiatric: Normal mood and affect. Behavior is normal.      Assessment & Plan:    See Problem List for Assessment and Plan of chronic medical problems.

## 2017-08-16 NOTE — Progress Notes (Signed)
Subjective:    Patient ID: Cynthia Kirk, female    DOB: 22-Jan-1951, 66 y.o.   MRN: [id number]  HPI     Medications and allergies reviewed with patient and updated if appropriate.  Patient Active Problem List   Diagnosis Date Noted  . Acute sinusitis 04/19/2017  . Anxiety 02/01/2017  . Acute pain of left shoulder 02/01/2017  . Renal cyst 07/23/2016  . Situational depression 06/24/2016  . Cervical disc disorder with radiculopathy of cervical region 03/13/2016  . B12 deficiency   . Cataract 02/25/2014  . Glaucoma suspect 02/25/2014  . Lumbar spinal stenosis 03/07/2013  . Ejection fraction   . Syncope   . Severe obesity (BMI >= 40) ([place]) 09/14/2010  . Essential hypertension 09/14/2010  . ANEMIA-IRON DEFICIENCY 03/20/2010  . GERD 08/22/2009  . Pulmonary sarcoidosis ([place]) 06/30/2009  . Diabetes mellitus ([place]) 06/30/2009  . OSA on CPAP 06/30/2009  . Upper airway cough syndrome 06/30/2009  . Mild persistent asthma 06/30/2009    Current Outpatient Prescriptions on File Prior to Visit  Medication Sig Dispense Refill  . albuterol (PROVENTIL HFA;VENTOLIN HFA) 108 (90 BASE) MCG/ACT inhaler Inhale 2 puffs into the lungs every 6 (six) hours as needed for wheezing or shortness of breath.    [name] albuterol (PROVENTIL) (2.5 MG/3ML) 0.083% nebulizer solution Take 3 mLs (2.5 mg total) by nebulization every 6 (six) hours as needed for shortness of breath. 75 mL 5  . ALPRAZolam (XANAX) 1 MG tablet Take 1 tablet (1 mg total) by mouth 2 (two) times daily as needed. 180 tablet 0  . Coenzyme Q10 (COQ10) 200 MG CAPS Take 1 capsule by mouth daily.    . cyclobenzaprine (FLEXERIL) 10 MG tablet TAKE 0.5-1 TABLETS (5-10 MG TOTAL) BY MOUTH 3 (THREE) TIMES DAILY AS NEEDED FOR MUSCLE SPASMS. 90 tablet 0  . dexlansoprazole (DEXILANT) 60 MG capsule Take 1 capsule (60 mg total) by mouth daily. 90 capsule 3  . ESTRACE VAGINAL 0.1 MG/GM vaginal cream Place 1 Applicatorful vaginally at bedtime.     .  gabapentin (NEURONTIN) 100 MG capsule TAKE 1 CAPSULE AT BEDTIME 30 capsule 7  . hydrocortisone (PROCTOSOL HC) 2.5 % rectal cream APPLY RECTALLY 2 (TWO) TIMES DAILY. (Patient not taking: Reported on 08/05/2017) 28.35 g 2  . hydroquinone 4 % cream Apply topically 2 (two) times daily. (Patient not taking: Reported on 08/05/2017) 28.35 g 0  . ipratropium (ATROVENT) 0.06 % nasal spray Place 2 sprays into the nose daily.    . Liraglutide (VICTOZA) 18 MG/3ML SOPN INJECT 1.8 MG SQ DAILY 9 pen 2  . mometasone (ASMANEX 60 METERED DOSES) 220 MCG/INH inhaler INHALE 2 PUFFS INTO THE LUNGS DAILY. (Patient not taking: Reported on 08/05/2017) 1 Inhaler 6  . montelukast (SINGULAIR) 10 MG tablet TAKE 1 TABLET (10 MG TOTAL) BY MOUTH AT BEDTIME. 90 tablet 3  . nebivolol (BYSTOLIC) 5 MG tablet Take 1 tablet (5 mg total) by mouth daily. 90 tablet 3  . olmesartan-hydrochlorothiazide ([name] HCT) 40-25 MG tablet Take 1 tablet by mouth daily. 90 tablet 3  . ranitidine (ZANTAC) 300 MG tablet TAKE 1 TABLET (300 MG TOTAL) BY MOUTH TWICE DAILY 180 tablet 3  . traMADol (ULTRAM) 50 MG tablet Take 1 tablet (50 mg total) by mouth every 6 (six) hours as needed. 15 tablet 0   No current facility-administered medications on file prior to visit.     Past Medical History:  Diagnosis Date  . Abnormal Pap smear 2008  . Adenomatous colon polyp   .  Allergic rhinitis, cause unspecified   . Anxiety   . ASCUS with positive high risk HPV   . Blood transfusion 1976   Due to ectopic pregnancy  . Chronic back pain   . Ejection fraction    EF 45-50%, echo, October 27, 2011  . Endometrial mass 2011  . Esophageal reflux   . Fibroid   . H/O Clostridium difficile infection 03/2010  . H/O menorrhagia 2011  . Herpes simplex type 2 infection complicating pregnancy   . Hiatal hernia   . Iron deficiency anemia   . Morbid obesity ([place]) 2010  . Obstructive sleep apnea (adult) (pediatric)    cpap  . Panic attacks   . Pneumonia 2005; 2007;  2009; 2011  . Sarcoidosis 1996   @ [employer]  . Simple endometrial hyperplasia 2011  . Spinal stenosis, lumbar    s/p decompression laminectomy 09/2013  . Syncope    ?? Syncope ??  . Type 2 diabetes, diet controlled ([place])   . Unspecified asthma(493.90)   . Urinary incontinence, mixed 2011  . VIN II (vulvar intraepithelial neoplasia II)     Past Surgical History:  Procedure Laterality Date  . COLPOSCOPY VULVA W/ BIOPSY     "I've had maybe 3" (09/05/2013)  . DILATION AND CURETTAGE OF UTERUS  2007  . [place]; ~ 1978  . HYSTEROSCOPY DIAGNOSTIC  2011  . LUMBAR LAMINECTOMY/DECOMPRESSION MICRODISCECTOMY N/A 09/05/2013   Procedure: Thoracic eleven-twelve Thoracic Laminectomy;  Surgeon: Eustace Moore, MD;  Location: [facility];  Service: Neurosurgery;  Laterality: N/A;  Thoracic eleven-twelve Thoracic Laminectomy  . MAXIMUM ACCESS (MAS)POSTERIOR LUMBAR INTERBODY FUSION (PLIF) 1 LEVEL N/A 09/05/2013   Procedure: Lumbar four-five Maximum Access Surgery  Posterior lumbar interbody fusion;  Surgeon: Eustace Moore, MD;  Location: [place];  Service: Neurosurgery;  Laterality: N/A;  Lumbar four-five Maximum Access Surgery  Posterior lumbar interbody fusion  . POSTERIOR LAMINECTOMY / DECOMPRESSION LUMBAR SPINE  09/05/2013  . POSTERIOR LUMBAR FUSION  09/05/2013  . THORACIC LAMINECTOMY  09/05/2013  . URETHRAL SLING  2011  . VULVECTOMY  ~ 2007   "for cancerous cells" (09/05/2013)    Social History   Social History  . Marital status: Married    Spouse name: N/A  . Number of children: 2  . Years of education: PHD   Occupational History  . retired   .  Unemployed   Social History Main Topics  . Smoking status: Never Smoker  . Smokeless tobacco: Never Used  . Alcohol use Yes     Comment: 2018 - very occasionally  . Drug use: No  . Sexual activity: Yes    Birth control/ protection: Post-menopausal   Other Topics Concern  . Not on file   Social History Narrative    Pt lives at home with her spouse.   She does not use caffeine.    Family History  Problem Relation Age of Onset  . Heart attack Father   . Hypertension Father   . Diabetes Father   . Seizures Father   . Migraines Father   . Kidney disease Sister   . Osteoporosis Sister   . Heart attack Sister   . Cancer Paternal Uncle   . Asthma Daughter   . Asthma Son   . Osteoporosis Sister   . Lung cancer Maternal Uncle   . Kidney disease Other   . Diabetes Other   . High Cholesterol Brother        x2  Review of Systems     Objective:  There were no vitals filed for this visit. Wt Readings from Last 3 Encounters:  05/06/17 235 lb (106.6 kg)  04/19/17 235 lb (106.6 kg)  04/06/17 236 lb (107 kg)   There is no height or weight on file to calculate BMI.   Physical Exam         Assessment & Plan:    See Problem List for Assessment and Plan of chronic medical problems.   This encounter was created in error - please disregard.

## 2017-08-16 NOTE — Patient Instructions (Addendum)
  Test(s) ordered today. Your results will be released to [place] (or called to you) after review, usually within 72hours after test completion. If any changes need to be made, you will be notified at that same time.  All other Health Maintenance issues reviewed.   All recommended immunizations and age-appropriate screenings are up-to-date or discussed.  Flu immunization administered today.   Medications reviewed and updated.  A prescription for pain medication was given.    Your prescription(s) have been submitted to your pharmacy. Please take as directed and contact our office if you believe you are having problem(s) with the medication(s).   Please followup in 6 months

## 2017-08-16 NOTE — Assessment & Plan Note (Signed)
GERD controlled Continue daily medication  

## 2017-08-16 NOTE — Assessment & Plan Note (Signed)
Having pain in her right shoulder upper back and neck-was evaluated by her back specialist and steroid injections into the neck did not help Being referred to orthopedics for further evaluation and possible treatments of shoulder origin of her pain She is in severe pain-I will prescribe hydrocodone-maximum 5 days She will need to get further pain management from orthopedics

## 2017-08-16 NOTE — Assessment & Plan Note (Signed)
Diabetes is always been well controlled-taking Victoza to help keep her sugars controlled and also aid with weight loss Will check A1c Continue Victoza at current dose

## 2017-08-16 NOTE — Assessment & Plan Note (Signed)
She is taking xanax as needed Medication works well, no side effects Will continue current dose

## 2017-08-16 NOTE — Assessment & Plan Note (Signed)
Controlled, stable Continue current dose of medication  

## 2017-08-27 DIAGNOSIS — M7541 Impingement syndrome of right shoulder: Secondary | ICD-10-CM | POA: Diagnosis not present

## 2017-08-27 DIAGNOSIS — M542 Cervicalgia: Secondary | ICD-10-CM | POA: Diagnosis not present

## 2017-08-27 DIAGNOSIS — M75111 Incomplete rotator cuff tear or rupture of right shoulder, not specified as traumatic: Secondary | ICD-10-CM | POA: Diagnosis not present

## 2017-09-13 ENCOUNTER — Other Ambulatory Visit: Payer: Self-pay | Admitting: Podiatry

## 2017-09-13 NOTE — Telephone Encounter (Signed)
Pt needs an appt

## 2017-09-17 ENCOUNTER — Other Ambulatory Visit: Payer: Self-pay | Admitting: Internal Medicine

## 2017-09-24 DIAGNOSIS — M75111 Incomplete rotator cuff tear or rupture of right shoulder, not specified as traumatic: Secondary | ICD-10-CM | POA: Diagnosis not present

## 2017-10-07 ENCOUNTER — Other Ambulatory Visit: Payer: Self-pay | Admitting: Podiatry

## 2017-10-07 ENCOUNTER — Other Ambulatory Visit: Payer: Self-pay | Admitting: Internal Medicine

## 2017-10-24 ENCOUNTER — Other Ambulatory Visit: Payer: Self-pay | Admitting: Internal Medicine

## 2017-10-27 ENCOUNTER — Other Ambulatory Visit: Payer: Self-pay | Admitting: Podiatry

## 2017-10-28 ENCOUNTER — Other Ambulatory Visit: Payer: Self-pay | Admitting: Internal Medicine

## 2017-10-31 NOTE — Telephone Encounter (Signed)
Pt needs an appt prior to future refills. 

## 2017-11-01 ENCOUNTER — Ambulatory Visit: Payer: Medicare Other | Admitting: Pulmonary Disease

## 2017-11-01 ENCOUNTER — Other Ambulatory Visit: Payer: Self-pay | Admitting: Internal Medicine

## 2017-11-01 NOTE — Telephone Encounter (Signed)
[place] Controlled Substance Database checked. Last filled on 07/25/17

## 2017-11-09 ENCOUNTER — Telehealth: Payer: Self-pay | Admitting: Internal Medicine

## 2017-11-09 MED ORDER — OLMESARTAN MEDOXOMIL-HCTZ 40-25 MG PO TABS: 1.0000 | ORAL_TABLET | Freq: Every day | ORAL | 0 refills | Status: DC

## 2017-11-09 NOTE — Telephone Encounter (Signed)
Patient stated [facility] sent her only 30 tabs last month instead of the 90 day supply which was ordered. She requested a month's supply of [name] HCT 40-25 mg tabs be sent to local pharmacy, [place]. [facility]

## 2017-11-09 NOTE — Telephone Encounter (Signed)
Copied from [place]. Topic: Quick Communication - Rx Refill/Question >> Nov 09, 2017 12:13 PM Robina Ade, Helene Kelp D wrote: Has the patient contacted their pharmacy? Yes (Agent: If no, request that the patient contact the pharmacy for the refill.) Preferred Pharmacy (with phone number or street name): [id number] [place], [place]: Please be advised that RX refills may take up to 3 business days. We ask that you follow-up with your pharmacy. Patient needs refill on her olmesartan-hydrochlorothiazide ([name] HCT) 40-25 MG tablet. Patient is completely out. She is requesting a 90 day supply.

## 2017-11-22 ENCOUNTER — Encounter: Payer: Self-pay | Admitting: Pulmonary Disease

## 2017-11-22 ENCOUNTER — Ambulatory Visit (INDEPENDENT_AMBULATORY_CARE_PROVIDER_SITE_OTHER): Payer: Medicare Other | Admitting: Pulmonary Disease

## 2017-11-22 VITALS — BP 124/76 | HR 94 | Ht 64.0 in | Wt 233.6 lb

## 2017-11-22 DIAGNOSIS — J45998 Other asthma: Secondary | ICD-10-CM | POA: Diagnosis not present

## 2017-11-22 DIAGNOSIS — Z9989 Dependence on other enabling machines and devices: Secondary | ICD-10-CM | POA: Diagnosis not present

## 2017-11-22 DIAGNOSIS — D869 Sarcoidosis, unspecified: Secondary | ICD-10-CM | POA: Diagnosis not present

## 2017-11-22 DIAGNOSIS — G4733 Obstructive sleep apnea (adult) (pediatric): Secondary | ICD-10-CM | POA: Diagnosis not present

## 2017-11-22 DIAGNOSIS — Z6841 Body Mass Index (BMI) 40.0 and over, adult: Secondary | ICD-10-CM

## 2017-11-22 MED ORDER — ALBUTEROL SULFATE (2.5 MG/3ML) 0.083% IN NEBU: 2.5000 mg | INHALATION_SOLUTION | Freq: Four times a day (QID) | RESPIRATORY_TRACT | 5 refills | Status: DC | PRN

## 2017-11-22 MED ORDER — ALBUTEROL SULFATE HFA 108 (90 BASE) MCG/ACT IN AERS: 2.0000 | INHALATION_SPRAY | Freq: Four times a day (QID) | RESPIRATORY_TRACT | 5 refills | Status: DC | PRN

## 2017-11-22 MED ORDER — MOMETASONE FUROATE 220 MCG/INH IN AEPB: INHALATION_SPRAY | RESPIRATORY_TRACT | 6 refills | Status: DC

## 2017-11-22 NOTE — Progress Notes (Signed)
[facility]  Chief Complaint  Patient presents with  . Follow-up    Pt has productive cough every morning - green sometimes yellow, and on occassion spots of bright red blood.     Vital signs: BP 124/76 (BP Location: Left Arm, Cuff Size: Normal)   Pulse 94   Ht 5\' 4"  (1.626 m)   Wt 233 lb 9.6 oz (106 kg)   LMP 02/14/2014   SpO2 97%   BMI 40.10 kg/m   History of Present Illness: Cynthia Kirk is a 66 y.o. female pulmonary sarcoidosis, asthma, and obstructive sleep apnea.  She has run out of her inhalers.  She is still using singulair.    She has been getting more short of breath.  She has increased cough with green sputum.  She sometimes has some blood mixed with her sputum.  She is not having fever, sweats, or chest pain.  She gets ankle swelling at times.  She also gets itching in her left lower leg intermittently.    She was seen by ENT and started on atrovent for vasomotor rhinitis.  She uses this at least daily.  She uses CPAP on most night.  No issues with mask fit.  Ambulatory oximetry on room air today was okay.  Physical Exam:  General - pleasant Eyes - pupils reactive, wears glasses ENT - no sinus tenderness, no oral exudate, no LAN Cardiac - regular, no murmur Chest - no wheeze, rales Abd - soft, non tender Ext - no edema Skin - no rashes Neuro - normal strength Psych - normal mood  Labs: CMP Latest Ref Rng & Units 08/16/2017 07/23/2016 03/01/2016  Glucose 70 - 99 mg/dL 89 90 76  BUN 6 - 23 mg/dL 19 12 16   Creatinine 0.40 - 1.20 mg/dL 0.93 0.83 0.94  Sodium 135 - 145 mEq/L 138 140 138  Potassium 3.5 - 5.1 mEq/L 3.7 4.0 3.8  Chloride 96 - 112 mEq/L 98 101 102  CO2 19 - 32 mEq/L 32 33(H) 25  Calcium 8.4 - 10.5 mg/dL 9.4 9.6 9.1  Total Protein 6.0 - 8.3 g/dL 6.8 7.1 -  Total Bilirubin 0.2 - 1.2 mg/dL 0.5 0.6 -  Alkaline Phos 39 - 117 U/L 86 90 -  AST 0 - 37 U/L 16 21 -  ALT 0 - 35 U/L 8 11 -    CBC Latest Ref  Rng & Units 08/16/2017 07/23/2016 03/01/2016  WBC 4.0 - 10.5 K/uL 5.8 6.7 5.1  Hemoglobin 12.0 - 15.0 g/dL 12.1 12.2 11.7(L)  Hematocrit 36.0 - 46.0 % 36.9 36.7 36.9  Platelets 150.0 - 400.0 K/uL 253.0 270.0 222     Discussion: She has progressive dyspnea and increased increased productive cough.  Could be related to asthma and lack of inhaler medication, or could be progression of sarcoidosis.  Assessment/Plan:  Pulmonary sarcoidosis. - will arrange for HRCT chest and pulmonary function test to further assess  Persistent asthma. - renew asmanex and albuterol - continue singulair  Obstructive sleep apnea. - she is compliant with CPAP and reports benefit from therapy - will arrange for ONO with CPAP - continue CPAP 10 cm H2O  Vasomotor rhinitis. - prn atrovent nasal spray per ENT  Obesity. - discussed importance of weight loss   Patient Instructions  Asmanex two puffs daily, and rinse your mouth after each use Singulair 10 mg pill nightly Albuterol every 4 to 6 hours as needed for cough, wheeze, or chest congestion Will arrange for overnight  oxygen test with you using CPAP Will schedule high resolution CT chest and pulmonary function test  Follow up in 4 to 6 weeks with Dr. Beckey Rutter, MD [place] 11/22/2017, 12:36 PM Pager:  [phone]  Flow Sheet  Pulmonary tests: Spirometry 06/30/09 >> FEV1 1.69(87%), FVC 2.17(83%), FEV1% 78 CT chest 07/17/14 >> upper lobe predominant BTX, GGO, nodularity no change since 2011 PFT 11/15/14 >> FEV1 1.76 (87%), FEV1% 77, TLC 3.19 (63%), DLCO 74%, no BD  Sleep tests: PSG 03/06/14 >> AHI 1.9, REM AHI 26.7, SaO2 low 80%. PSG 05/28/14 >> AHI 5.8, SaO2 low 85%. REM AHI 29.3 CPAP 10/19/17 to 11/17/17 >> used on 26 of 30 nights with average 6 hrs 28 min.  Average AHI 0.6 with CPAP 10 cm H2O  Cardiac tests: Echo 03/09/17 >> EF 55 to 60%  Past Medical History: She  has a past medical history of Abnormal  Pap smear (2008), Adenomatous colon polyp, Allergic rhinitis, cause unspecified, Anxiety, ASCUS with positive high risk HPV, Blood transfusion (1976), Chronic back pain, Ejection fraction, Endometrial mass (2011), Esophageal reflux, Fibroid, H/O Clostridium difficile infection (03/2010), H/O menorrhagia (2011), Herpes simplex type 2 infection complicating pregnancy, Hiatal hernia, Iron deficiency anemia, Morbid obesity ([place]) (2010), Obstructive sleep apnea (adult) (pediatric), Panic attacks, Pneumonia (2005; 2007; 2009; 2011), Sarcoidosis (1996), Simple endometrial hyperplasia (2011), Spinal stenosis, lumbar, Syncope, Type 2 diabetes, diet controlled ([place]), Unspecified asthma(493.90), Urinary incontinence, mixed (2011), and VIN II (vulvar intraepithelial neoplasia II).  Past Surgical History: She  has a past surgical history that includes Vulvectomy (~ 2007); Ectopic pregnancy surgery (1976; ~ 1978); Urethral sling (2011); Dilation and curettage of uterus (2007); Posterior laminectomy / decompression lumbar spine (09/05/2013); Thoracic laminectomy (09/05/2013); Posterior lumbar fusion (09/05/2013); Hysteroscopy diagnostic (2011); Colposcopy vulva w/ biopsy; Maximum access (mas)posterior lumbar interbody fusion (plif) 1 level (N/A, 09/05/2013); and Lumbar laminectomy/decompression microdiscectomy (N/A, 09/05/2013).  Family History: Her family history includes Asthma in her daughter and son; Cancer in her paternal uncle; Diabetes in her father and other; Heart attack in her father and sister; High Cholesterol in her brother; Hypertension in her father; Kidney disease in her other and sister; Lung cancer in her maternal uncle; Migraines in her father; Osteoporosis in her sister and sister; Seizures in her father.  Social History: She  reports that  has never smoked. she has never used smokeless tobacco. She reports that she drinks alcohol. She reports that she does not use drugs.  Medications: Allergies as of  11/22/2017      Reactions   Aspirin    REACTION: hives   Ibuprofen    Upsets stomach   Latex    REACTION: rash      Medication List        Accurate as of 11/22/17 12:36 PM. Always use your most recent med list.          albuterol 108 (90 Base) MCG/ACT inhaler Commonly known as:  PROVENTIL HFA;VENTOLIN HFA Inhale 2 puffs into the lungs every 6 (six) hours as needed for wheezing or shortness of breath.   albuterol (2.5 MG/3ML) 0.083% nebulizer solution Commonly known as:  PROVENTIL Take 3 mLs (2.5 mg total) by nebulization every 6 (six) hours as needed for shortness of breath.   ALPRAZolam 1 MG tablet Commonly known as:  XANAX TAKE 1 TABLET TWICE A DAY  AS NEEDED   BYSTOLIC 5 MG tablet Generic drug:  nebivolol TAKE 1 TABLET DAILY   CoQ10 200 MG Caps Take  1 capsule by mouth daily.   cyclobenzaprine 10 MG tablet Commonly known as:  FLEXERIL TAKE 0.5-1 TABLETS (5-10 MG TOTAL) BY MOUTH 3 (THREE) TIMES DAILY AS NEEDED FOR MUSCLE SPASMS.   dexlansoprazole 60 MG capsule Commonly known as:  DEXILANT Take 1 capsule (60 mg total) by mouth daily.   gabapentin 100 MG capsule Commonly known as:  NEURONTIN TAKE 1 CAPSULE AT BEDTIME   HYDROcodone-acetaminophen 10-325 MG tablet Commonly known as:  NORCO Take 1 tablet by mouth every 6 (six) hours as needed.   hydroquinone 4 % cream Apply topically 2 (two) times daily.   ipratropium 0.06 % nasal spray Commonly known as:  ATROVENT Place 2 sprays into the nose daily.   liraglutide 18 MG/3ML Sopn Commonly known as:  VICTOZA INJECT 1.8 MG SQ DAILY   mometasone 220 MCG/INH inhaler Commonly known as:  ASMANEX 60 METERED DOSES INHALE 2 PUFFS INTO THE LUNGS DAILY.   montelukast 10 MG tablet Commonly known as:  SINGULAIR TAKE 1 TABLET AT BEDTIME   olmesartan-hydrochlorothiazide 40-25 MG tablet Commonly known as:  [name] HCT Take 1 tablet by mouth daily.   ranitidine 300 MG tablet Commonly known as:  ZANTAC TAKE 1  TABLET (300 MG TOTAL) BY MOUTH TWICE DAILY

## 2017-11-22 NOTE — Patient Instructions (Signed)
Asmanex two puffs daily, and rinse your mouth after each use Singulair 10 mg pill nightly Albuterol every 4 to 6 hours as needed for cough, wheeze, or chest congestion Will arrange for overnight oxygen test with you using CPAP Will schedule high resolution CT chest and pulmonary function test  Follow up in 4 to 6 weeks with Dr. Halford Chessman

## 2017-11-23 ENCOUNTER — Telehealth: Payer: Self-pay | Admitting: Pulmonary Disease

## 2017-11-23 MED ORDER — ALBUTEROL SULFATE (2.5 MG/3ML) 0.083% IN NEBU: 2.5000 mg | INHALATION_SOLUTION | Freq: Four times a day (QID) | RESPIRATORY_TRACT | 3 refills | Status: DC | PRN

## 2017-11-23 MED ORDER — ALBUTEROL SULFATE HFA 108 (90 BASE) MCG/ACT IN AERS: 2.0000 | INHALATION_SPRAY | Freq: Four times a day (QID) | RESPIRATORY_TRACT | 3 refills | Status: DC | PRN

## 2017-11-23 NOTE — Telephone Encounter (Signed)
Pt requesting 90 day supplies of albuterol neb and inhaler to her mail order pharmacy.  This has been sent in.  Pt also states that her pharmacy is requesting an alternative to her her asmanex.    Called Pharmacy, states that asmanex is not covered by insurance, prefers flovent.  VS please advise if you'd like to switch to flovent or attempt a PA.  Thanks.

## 2017-11-24 NOTE — Telephone Encounter (Signed)
Can send 90 day supply of albuterol nebulizer medication and inhaler.  D/c asmanex.  Send script for flovent 44 mcg bid.

## 2017-11-25 MED ORDER — FLUTICASONE PROPIONATE HFA 44 MCG/ACT IN AERO: 2.0000 | INHALATION_SPRAY | Freq: Two times a day (BID) | RESPIRATORY_TRACT | 3 refills | Status: DC

## 2017-11-25 MED ORDER — ALBUTEROL SULFATE HFA 108 (90 BASE) MCG/ACT IN AERS: 2.0000 | INHALATION_SPRAY | Freq: Four times a day (QID) | RESPIRATORY_TRACT | 3 refills | Status: DC | PRN

## 2017-11-25 MED ORDER — ALBUTEROL SULFATE (2.5 MG/3ML) 0.083% IN NEBU: 2.5000 mg | INHALATION_SOLUTION | Freq: Four times a day (QID) | RESPIRATORY_TRACT | 3 refills | Status: DC | PRN

## 2017-11-25 NOTE — Telephone Encounter (Signed)
Spoke with pt. She is aware that we will send in these prescriptions for her. Rxs have been sent in. Nothing further was needed.

## 2017-12-08 ENCOUNTER — Encounter: Payer: Self-pay | Admitting: Pulmonary Disease

## 2017-12-08 DIAGNOSIS — J449 Chronic obstructive pulmonary disease, unspecified: Secondary | ICD-10-CM | POA: Diagnosis not present

## 2017-12-08 DIAGNOSIS — R0902 Hypoxemia: Secondary | ICD-10-CM | POA: Diagnosis not present

## 2017-12-13 ENCOUNTER — Telehealth: Payer: Self-pay | Admitting: Pulmonary Disease

## 2017-12-13 DIAGNOSIS — G4733 Obstructive sleep apnea (adult) (pediatric): Secondary | ICD-10-CM

## 2017-12-13 DIAGNOSIS — Z9989 Dependence on other enabling machines and devices: Principal | ICD-10-CM

## 2017-12-13 NOTE — Telephone Encounter (Signed)
ONO with CPAP 12/08/17 >> test time 2 hrs 26 min.  Average SpO2 94%, low SpO2 81%.  Spent 16.1 min with SpO2 < 88%.   Please let her know that her ONO with CPAP showed low oxygen level.  She needs to get set up for in lab CPAP titration study to determine if she needs to have supplemental oxygen added at night with CPAP.  If she is agreeable then please place order for CPAP titration.

## 2017-12-14 NOTE — Telephone Encounter (Signed)
Spoke with pt and notified of results per Dr. Halford Chessman. Pt verbalized understanding and denied any questions. Order sent to [facility] for CPAP titration

## 2017-12-20 DIAGNOSIS — D869 Sarcoidosis, unspecified: Secondary | ICD-10-CM | POA: Diagnosis not present

## 2017-12-20 DIAGNOSIS — R918 Other nonspecific abnormal finding of lung field: Secondary | ICD-10-CM | POA: Diagnosis not present

## 2017-12-20 DIAGNOSIS — J479 Bronchiectasis, uncomplicated: Secondary | ICD-10-CM | POA: Diagnosis not present

## 2017-12-20 DIAGNOSIS — J8410 Pulmonary fibrosis, unspecified: Secondary | ICD-10-CM | POA: Diagnosis not present

## 2017-12-27 ENCOUNTER — Encounter (HOSPITAL_COMMUNITY): Payer: Self-pay | Admitting: Emergency Medicine

## 2017-12-27 ENCOUNTER — Ambulatory Visit (INDEPENDENT_AMBULATORY_CARE_PROVIDER_SITE_OTHER): Payer: Medicare Other | Admitting: Pulmonary Disease

## 2017-12-27 ENCOUNTER — Emergency Department (HOSPITAL_COMMUNITY)
Admission: EM | Admit: 2017-12-27 | Discharge: 2017-12-27 | Disposition: A | Discharge status: Home / Self Care | Payer: Medicare Other | Attending: Emergency Medicine | Admitting: Emergency Medicine

## 2017-12-27 ENCOUNTER — Emergency Department (HOSPITAL_COMMUNITY): Payer: Medicare Other

## 2017-12-27 DIAGNOSIS — J45909 Unspecified asthma, uncomplicated: Secondary | ICD-10-CM | POA: Diagnosis not present

## 2017-12-27 DIAGNOSIS — E119 Type 2 diabetes mellitus without complications: Secondary | ICD-10-CM | POA: Insufficient documentation

## 2017-12-27 DIAGNOSIS — R079 Chest pain, unspecified: Secondary | ICD-10-CM | POA: Diagnosis not present

## 2017-12-27 DIAGNOSIS — Z91040 Latex allergy status: Secondary | ICD-10-CM | POA: Insufficient documentation

## 2017-12-27 DIAGNOSIS — Z79899 Other long term (current) drug therapy: Secondary | ICD-10-CM | POA: Diagnosis not present

## 2017-12-27 DIAGNOSIS — I10 Essential (primary) hypertension: Secondary | ICD-10-CM | POA: Diagnosis not present

## 2017-12-27 DIAGNOSIS — D869 Sarcoidosis, unspecified: Secondary | ICD-10-CM | POA: Diagnosis not present

## 2017-12-27 DIAGNOSIS — R0789 Other chest pain: Secondary | ICD-10-CM

## 2017-12-27 DIAGNOSIS — J45998 Other asthma: Secondary | ICD-10-CM

## 2017-12-27 DIAGNOSIS — J189 Pneumonia, unspecified organism: Secondary | ICD-10-CM | POA: Diagnosis not present

## 2017-12-27 DIAGNOSIS — Z7984 Long term (current) use of oral hypoglycemic drugs: Secondary | ICD-10-CM | POA: Diagnosis not present

## 2017-12-27 LAB — BASIC METABOLIC PANEL
ANION GAP: 14 (ref 5–15)
BUN: 16 mg/dL (ref 6–20)
CALCIUM: 9.4 mg/dL (ref 8.9–10.3)
CO2: 25 mmol/L (ref 22–32)
Chloride: 100 mmol/L — ABNORMAL LOW (ref 101–111)
Creatinine, Ser: 0.95 mg/dL (ref 0.44–1.00)
GLUCOSE: 66 mg/dL (ref 65–99)
Potassium: 3.7 mmol/L (ref 3.5–5.1)
Sodium: 139 mmol/L (ref 135–145)

## 2017-12-27 LAB — PULMONARY FUNCTION TEST
DL/VA % pred: 84 %
DL/VA: 4.04 ml/min/mmHg/L
DLCO UNC % PRED: 61 %
DLCO cor % pred: 60 %
DLCO cor: 14.66 ml/min/mmHg
DLCO unc: 14.92 ml/min/mmHg
FEF 25-75 PRE: 0.99 L/s
FEF2575-%Pred-Pre: 54 %
FEV1-%PRED-PRE: 59 %
FEV1-PRE: 1.14 L
FEV1FVC-%PRED-PRE: 99 %
FEV6-%Pred-Pre: 61 %
FEV6-PRE: 1.46 L
FEV6FVC-%Pred-Pre: 104 %
FVC-%Pred-Pre: 59 %
FVC-Pre: 1.46 L
Pre FEV1/FVC ratio: 78 %
Pre FEV6/FVC Ratio: 100 %
RV % PRED: 78 %
RV: 1.67 L
TLC % pred: 74 %
TLC: 3.75 L

## 2017-12-27 LAB — CBC
HCT: 39.4 % (ref 36.0–46.0)
HEMOGLOBIN: 12.9 g/dL (ref 12.0–15.0)
MCH: 29.5 pg (ref 26.0–34.0)
MCHC: 32.7 g/dL (ref 30.0–36.0)
MCV: 90.2 fL (ref 78.0–100.0)
Platelets: 232 10*3/uL (ref 150–400)
RBC: 4.37 MIL/uL (ref 3.87–5.11)
RDW: 13.3 % (ref 11.5–15.5)
WBC: 4.8 10*3/uL (ref 4.0–10.5)

## 2017-12-27 LAB — TROPONIN I

## 2017-12-27 MED ORDER — PROBIOTIC & ACIDOPHILUS EX ST PO CAPS: 1.0000 | ORAL_CAPSULE | Freq: Two times a day (BID) | ORAL | 0 refills | Status: DC

## 2017-12-27 MED ORDER — AZITHROMYCIN 250 MG PO TABS
500.0000 mg | ORAL_TABLET | Freq: Once | ORAL | Status: AC
  Administered 2017-12-27: 500 mg via ORAL
  Filled 2017-12-27: qty 2

## 2017-12-27 MED ORDER — FLUCONAZOLE 150 MG PO TABS: 150.0000 mg | ORAL_TABLET | Freq: Once | ORAL | 0 refills | Status: AC

## 2017-12-27 MED ORDER — AZITHROMYCIN 250 MG PO TABS: ORAL_TABLET | ORAL | 0 refills | Status: DC

## 2017-12-27 MED ORDER — OXYCODONE-ACETAMINOPHEN 5-325 MG PO TABS
2.0000 | ORAL_TABLET | Freq: Once | ORAL | Status: AC
  Administered 2017-12-27: 2 via ORAL
  Filled 2017-12-27: qty 2

## 2017-12-27 MED ORDER — OXYCODONE-ACETAMINOPHEN 5-325 MG PO TABS: 2.0000 | ORAL_TABLET | ORAL | 0 refills | Status: DC | PRN

## 2017-12-27 NOTE — ED Triage Notes (Signed)
Per EMS pt was getting a breathing test and developed some central CP 8/10 that radiated to her left arm.  She received 1 nitro in route NAD noted at this time. AOx4

## 2017-12-27 NOTE — ED Notes (Signed)
Patient given discharge instructions and verbalized understanding.  Patient stable to discharge at this time.  Patient is alert and oriented to baseline.  No distressed noted at this time.  All belongings taken with the patient at discharge.   

## 2017-12-27 NOTE — Progress Notes (Signed)
Began test, patient complained of dizziness after pleth. Patient then complained of chest pain, left arm pain so we stopped. Vitals obtained and were in normal range. Spoke with TP and patient has been advised to go to ED. EMS called.

## 2017-12-27 NOTE — ED Provider Notes (Signed)
[facility] EMERGENCY DEPARTMENT Provider Note   CSN: [id number] Arrival date & time: 12/27/17  1241     History   Chief Complaint Chief Complaint  Patient presents with  . Chest Pain    HPI Cynthia Kirk is a 67 y.o. female.  Pt presents to the ED today with CP.  The pt said sx started during there pulmonary function tests.  The pt was given 1 nitro by EMS which relieved her sx.  Pt has never had anything like this in the past.      Past Medical History:  Diagnosis Date  . Abnormal Pap smear 2008  . Adenomatous colon polyp   . Allergic rhinitis, cause unspecified   . Anxiety   . ASCUS with positive high risk HPV   . Blood transfusion 1976   Due to ectopic pregnancy  . Chronic back pain   . Ejection fraction    EF 45-50%, echo, October 27, 2011  . Endometrial mass 2011  . Esophageal reflux   . Fibroid   . H/O Clostridium difficile infection 03/2010  . H/O menorrhagia 2011  . Herpes simplex type 2 infection complicating pregnancy   . Hiatal hernia   . Iron deficiency anemia   . Morbid obesity ([place]) 2010  . Obstructive sleep apnea (adult) (pediatric)    cpap  . Panic attacks   . Pneumonia 2005; 2007; 2009; 2011  . Sarcoidosis 1996   @ [employer]  . Simple endometrial hyperplasia 2011  . Spinal stenosis, lumbar    s/p decompression laminectomy 09/2013  . Syncope    ?? Syncope ??  . Type 2 diabetes, diet controlled ([place])   . Unspecified asthma(493.90)   . Urinary incontinence, mixed 2011  . VIN II (vulvar intraepithelial neoplasia II)     Patient Active Problem List   Diagnosis Date Noted  . Right shoulder pain 08/16/2017  . Acute sinusitis 04/19/2017  . Anxiety 02/01/2017  . Acute pain of left shoulder 02/01/2017  . Renal cyst 07/23/2016  . Situational depression 06/24/2016  . Cervical disc disorder with radiculopathy of cervical region 03/13/2016  . B12 deficiency   . Cataract 02/25/2014  . Glaucoma suspect 02/25/2014  . Lumbar  spinal stenosis 03/07/2013  . Ejection fraction   . Syncope   . Severe obesity (BMI >= 40) ([place]) 09/14/2010  . Essential hypertension 09/14/2010  . ANEMIA-IRON DEFICIENCY 03/20/2010  . GERD 08/22/2009  . Pulmonary sarcoidosis ([place]) 06/30/2009  . Diabetes mellitus ([place]) 06/30/2009  . OSA on CPAP 06/30/2009  . Upper airway cough syndrome 06/30/2009  . Mild persistent asthma 06/30/2009    Past Surgical History:  Procedure Laterality Date  . COLPOSCOPY VULVA W/ BIOPSY     "I've had maybe 3" (09/05/2013)  . DILATION AND CURETTAGE OF UTERUS  2007  . [place]; ~ 1978  . HYSTEROSCOPY DIAGNOSTIC  2011  . LUMBAR LAMINECTOMY/DECOMPRESSION MICRODISCECTOMY N/A 09/05/2013   Procedure: Thoracic eleven-twelve Thoracic Laminectomy;  Surgeon: Eustace Moore, MD;  Location: [facility];  Service: Neurosurgery;  Laterality: N/A;  Thoracic eleven-twelve Thoracic Laminectomy  . MAXIMUM ACCESS (MAS)POSTERIOR LUMBAR INTERBODY FUSION (PLIF) 1 LEVEL N/A 09/05/2013   Procedure: Lumbar four-five Maximum Access Surgery  Posterior lumbar interbody fusion;  Surgeon: Eustace Moore, MD;  Location: [facility];  Service: Neurosurgery;  Laterality: N/A;  Lumbar four-five Maximum Access Surgery  Posterior lumbar interbody fusion  . POSTERIOR LAMINECTOMY / DECOMPRESSION LUMBAR SPINE  09/05/2013  . POSTERIOR LUMBAR FUSION  09/05/2013  . THORACIC LAMINECTOMY  09/05/2013  . URETHRAL SLING  2011  . VULVECTOMY  ~ 2007   "for cancerous cells" (09/05/2013)    OB History    Gravida Para Term Preterm AB Living   8         2   SAB TAB Ectopic Multiple Live Births                   Home Medications    Prior to Admission medications   Medication Sig Start Date End Date Taking? Authorizing Provider  albuterol (PROVENTIL HFA;VENTOLIN HFA) 108 (90 Base) MCG/ACT inhaler Inhale 2 puffs into the lungs every 6 (six) hours as needed for wheezing or shortness of breath. 11/25/17  Yes Chesley Mires, MD    albuterol (PROVENTIL) (2.5 MG/3ML) 0.083% nebulizer solution Take 3 mLs (2.5 mg total) by nebulization every 6 (six) hours as needed for shortness of breath. DX: C78.938 11/25/17  Yes Chesley Mires, MD  ALPRAZolam Duanne Moron) 1 MG tablet TAKE 1 TABLET TWICE A DAY  AS NEEDED 11/01/17  Yes Burns, Claudina Lick, MD  BYSTOLIC 5 MG tablet TAKE 1 TABLET DAILY 10/24/17  Yes Burns, Claudina Lick, MD  Coenzyme Q10 (COQ10) 100 MG CAPS Take 1 capsule by mouth daily.   Yes [provider]  cyclobenzaprine (FLEXERIL) 10 MG tablet TAKE 0.5-1 TABLETS (5-10 MG TOTAL) BY MOUTH 3 (THREE) TIMES DAILY AS NEEDED FOR MUSCLE SPASMS. 12/24/16  Yes Burns, Claudina Lick, MD  dexlansoprazole (DEXILANT) 60 MG capsule Take 1 capsule (60 mg total) by mouth daily. 04/06/17  Yes Ladene Artist, MD  fluticasone (FLOVENT HFA) 44 MCG/ACT inhaler Inhale 2 puffs into the lungs 2 (two) times daily. 11/25/17  Yes Chesley Mires, MD  gabapentin (NEURONTIN) 100 MG capsule TAKE 1 CAPSULE AT BEDTIME 10/11/16  Yes Hyatt, Max T, DPM  hydroquinone 4 % cream Apply topically 2 (two) times daily. 08/16/17  Yes Burns, Claudina Lick, MD  ipratropium (ATROVENT) 0.06 % nasal spray Place 2 sprays into the nose daily. 07/26/17  Yes [provider]  Liraglutide (VICTOZA) 18 MG/3ML SOPN INJECT 1.8 MG SQ DAILY 08/10/16  Yes Burns, Claudina Lick, MD  montelukast (SINGULAIR) 10 MG tablet TAKE 1 TABLET AT BEDTIME 10/07/17  Yes Burns, Claudina Lick, MD  Multiple Vitamins-Minerals (WOMENS 50+ MULTI VITAMIN/MIN) TABS Take 1 tablet by mouth daily.   Yes [provider]  olmesartan-hydrochlorothiazide ([name] HCT) 40-25 MG tablet Take 1 tablet by mouth daily. 11/09/17  Yes Burns, Claudina Lick, MD  ranitidine (ZANTAC) 300 MG tablet TAKE 1 TABLET (300 MG TOTAL) BY MOUTH TWICE DAILY 04/06/17  Yes Ladene Artist, MD  azithromycin (ZITHROMAX) 250 MG tablet Take 1 every day until finished. 12/28/17   Isla Pence, MD  fluconazole (DIFLUCAN) 150 MG tablet Take 1 tablet (150 mg total) by mouth  once for 1 dose. 12/27/17 12/27/17  Isla Pence, MD  HYDROcodone-acetaminophen (NORCO) 10-325 MG tablet Take 1 tablet by mouth every 6 (six) hours as needed. Patient not taking: Reported on 12/27/2017 08/16/17   [name], MD  mometasone (ASMANEX 60 METERED DOSES) 220 MCG/INH inhaler INHALE 2 PUFFS INTO THE LUNGS DAILY. Patient not taking: Reported on 12/27/2017 11/22/17   Chesley Mires, MD  oxyCODONE-acetaminophen (PERCOCET/ROXICET) 5-325 MG tablet Take 2 tablets by mouth every 4 (four) hours as needed for severe pain. 12/27/17   Isla Pence, MD  Probiotic Product (PROBIOTIC & ACIDOPHILUS EX ST) CAPS Take 1 capsule by mouth 2 (two) times daily. 12/27/17  Isla Pence, MD    Family History Family History  Problem Relation Age of Onset  . Heart attack Father   . Hypertension Father   . Diabetes Father   . Seizures Father   . Migraines Father   . Kidney disease Sister   . Osteoporosis Sister   . Heart attack Sister   . Cancer Paternal Uncle   . Asthma Daughter   . Asthma Son   . Osteoporosis Sister   . Lung cancer Maternal Uncle   . Kidney disease Other   . Diabetes Other   . High Cholesterol Brother        x2    Social History Social History   Tobacco Use  . Smoking status: Never Smoker  . Smokeless tobacco: Never Used  Substance Use Topics  . Alcohol use: Yes    Comment: 2018 - very occasionally  . Drug use: No     Allergies   Aspirin; Ibuprofen; and Latex   Review of Systems Review of Systems  Cardiovascular: Positive for chest pain.  All other systems reviewed and are negative.    Physical Exam Updated Vital Signs BP 115/73 (BP Location: Right Arm)   Pulse 78   Temp 98.1 F (36.7 C) (Oral)   Resp 13   Ht 5\' 5"  (1.651 m)   Wt 102.5 kg (226 lb)   LMP 02/14/2014   SpO2 100%   BMI 37.61 kg/m   Physical Exam  Constitutional: She is oriented to person, place, and time. She appears well-developed and well-nourished.  HENT:  Head:  Normocephalic and atraumatic.  Eyes: EOM are normal. Pupils are equal, round, and reactive to light.  Neck: Normal range of motion. Neck supple.  Cardiovascular: Normal rate, regular rhythm, intact distal pulses and normal pulses.  Pulmonary/Chest: Effort normal and breath sounds normal.  Abdominal: Soft. Bowel sounds are normal.  Musculoskeletal: Normal range of motion.       Right lower leg: Normal.       Left lower leg: Normal.  Neurological: She is alert and oriented to person, place, and time.  Skin: Skin is warm and dry. Capillary refill takes less than 2 seconds.  Psychiatric: She has a normal mood and affect. Her behavior is normal.  Nursing note and vitals reviewed.    ED Treatments / Results  Labs (all labs ordered are listed, but only abnormal results are displayed) Labs Reviewed  BASIC METABOLIC PANEL - Abnormal; Notable for the following components:      Result Value   Chloride 100 (*)    All other components within normal limits  CBC  TROPONIN I    EKG  EKG Interpretation  Date/Time:  Tuesday December 27 2017 12:51:14 EST Ventricular Rate:  77 PR Interval:    QRS Duration: 84 QT Interval:  399 QTC Calculation: 452 R Axis:   81 Text Interpretation:  Sinus rhythm Borderline right axis deviation Confirmed by Isla Pence [phone]) on 12/27/2017 1:11:58 PM       Radiology Dg Chest 2 View  Result Date: 12/27/2017 CLINICAL DATA:  Chest pain EXAM: CHEST  2 VIEW COMPARISON:  March 01, 2016 FINDINGS: There remains reticular interstitial disease in the upper lobes. There is ill-defined opacity in the upper lobe superimposed, concerning for potential upper lobe pneumonia superimposed on interstitial disease. Lungs elsewhere appear clear. Heart size and pulmonary vascularity are normal. No adenopathy. No evident bone lesions. There is aortic atherosclerosis. IMPRESSION: Ill-defined airspace opacity in both upper lobe superimposed on reticular  interstitial disease.  Concern for upper lobe pneumonia bilaterally. Lungs elsewhere clear. Heart size normal. No adenopathy evident. There is aortic atherosclerosis. Aortic Atherosclerosis (ICD10-I70.0). Followup PA and lateral chest radiographs recommended in 3-4 weeks following trial of antibiotic therapy to ensure resolution and exclude underlying malignancy. Electronically Signed   By: Lowella Grip III M.D.   On: 12/27/2017 13:53    Procedures Procedures (including critical care time)  Medications Ordered in ED Medications  azithromycin (ZITHROMAX) tablet 500 mg (not administered)  oxyCODONE-acetaminophen (PERCOCET/ROXICET) 5-325 MG per tablet 2 tablet (not administered)     Initial Impression / Assessment and Plan / ED Course  I have reviewed the triage vital signs and the nursing notes.  Pertinent labs & imaging results that were available during my care of the patient were reviewed by me and considered in my medical decision making (see chart for details).     Pt is feeling much better.  CP atypical and troponin ok.  Pt will be started on zithromax due to CXR findings.  She is encouraged to f/u with pcp to follow CXR.  Return if worse.  Final Clinical Impressions(s) / ED Diagnoses   Final diagnoses:  Atypical chest pain  Community acquired pneumonia, unspecified laterality    ED Discharge Orders        Ordered    azithromycin (ZITHROMAX) 250 MG tablet     12/27/17 1552    oxyCODONE-acetaminophen (PERCOCET/ROXICET) 5-325 MG tablet  Every 4 hours PRN     12/27/17 1554    Probiotic Product (PROBIOTIC & ACIDOPHILUS EX ST) CAPS  2 times daily     12/27/17 1554    fluconazole (DIFLUCAN) 150 MG tablet   Once     12/27/17 1554       Isla Pence, MD 12/27/17 1556

## 2018-01-02 ENCOUNTER — Ambulatory Visit (HOSPITAL_BASED_OUTPATIENT_CLINIC_OR_DEPARTMENT_OTHER): Payer: Medicare Other

## 2018-01-03 ENCOUNTER — Ambulatory Visit: Payer: Medicare Other | Admitting: Pulmonary Disease

## 2018-01-09 ENCOUNTER — Other Ambulatory Visit: Payer: Self-pay

## 2018-01-09 DIAGNOSIS — Z9989 Dependence on other enabling machines and devices: Principal | ICD-10-CM

## 2018-01-09 DIAGNOSIS — G4733 Obstructive sleep apnea (adult) (pediatric): Secondary | ICD-10-CM

## 2018-01-17 ENCOUNTER — Ambulatory Visit: Payer: Medicare Other | Admitting: Pulmonary Disease

## 2018-01-24 ENCOUNTER — Encounter (HOSPITAL_BASED_OUTPATIENT_CLINIC_OR_DEPARTMENT_OTHER): Payer: Medicare Other

## 2018-02-01 ENCOUNTER — Ambulatory Visit: Payer: Medicare Other | Admitting: Pulmonary Disease

## 2018-02-02 ENCOUNTER — Telehealth: Payer: Self-pay | Admitting: Gastroenterology

## 2018-02-03 NOTE — Telephone Encounter (Signed)
Per procedure report from 2012 patient should have a 10 year recall.  Recall placed and patient notified she is due in 2022

## 2018-02-06 ENCOUNTER — Other Ambulatory Visit: Payer: Self-pay | Admitting: Internal Medicine

## 2018-02-07 ENCOUNTER — Encounter (HOSPITAL_BASED_OUTPATIENT_CLINIC_OR_DEPARTMENT_OTHER): Payer: Medicare Other

## 2018-02-07 NOTE — Telephone Encounter (Signed)
[place] Controlled Substance Database checked. Last filled on 11/01/17

## 2018-02-15 ENCOUNTER — Ambulatory Visit: Payer: Medicare Other | Admitting: Internal Medicine

## 2018-02-15 ENCOUNTER — Ambulatory Visit: Payer: Medicare Other | Admitting: Pulmonary Disease

## 2018-02-21 ENCOUNTER — Ambulatory Visit (HOSPITAL_BASED_OUTPATIENT_CLINIC_OR_DEPARTMENT_OTHER): Payer: Medicare Other | Attending: Pulmonary Disease | Admitting: Pulmonary Disease

## 2018-02-21 VITALS — Ht 64.25 in | Wt 229.0 lb

## 2018-02-21 DIAGNOSIS — G4733 Obstructive sleep apnea (adult) (pediatric): Secondary | ICD-10-CM | POA: Diagnosis not present

## 2018-02-21 DIAGNOSIS — D860 Sarcoidosis of lung: Secondary | ICD-10-CM | POA: Diagnosis not present

## 2018-02-21 DIAGNOSIS — Z79899 Other long term (current) drug therapy: Secondary | ICD-10-CM | POA: Insufficient documentation

## 2018-02-21 DIAGNOSIS — I493 Ventricular premature depolarization: Secondary | ICD-10-CM | POA: Diagnosis not present

## 2018-02-21 DIAGNOSIS — Z9989 Dependence on other enabling machines and devices: Secondary | ICD-10-CM

## 2018-02-23 ENCOUNTER — Ambulatory Visit (INDEPENDENT_AMBULATORY_CARE_PROVIDER_SITE_OTHER): Payer: Medicare Other | Admitting: Podiatry

## 2018-02-23 ENCOUNTER — Telehealth: Payer: Self-pay | Admitting: Pulmonary Disease

## 2018-02-23 DIAGNOSIS — M898X9 Other specified disorders of bone, unspecified site: Secondary | ICD-10-CM

## 2018-02-23 DIAGNOSIS — B9689 Other specified bacterial agents as the cause of diseases classified elsewhere: Secondary | ICD-10-CM | POA: Insufficient documentation

## 2018-02-23 DIAGNOSIS — Z9989 Dependence on other enabling machines and devices: Secondary | ICD-10-CM

## 2018-02-23 DIAGNOSIS — N760 Acute vaginitis: Secondary | ICD-10-CM

## 2018-02-23 DIAGNOSIS — N952 Postmenopausal atrophic vaginitis: Secondary | ICD-10-CM | POA: Insufficient documentation

## 2018-02-23 DIAGNOSIS — R52 Pain, unspecified: Secondary | ICD-10-CM | POA: Insufficient documentation

## 2018-02-23 DIAGNOSIS — M722 Plantar fascial fibromatosis: Secondary | ICD-10-CM | POA: Diagnosis not present

## 2018-02-23 DIAGNOSIS — N950 Postmenopausal bleeding: Secondary | ICD-10-CM | POA: Insufficient documentation

## 2018-02-23 DIAGNOSIS — Z780 Asymptomatic menopausal state: Secondary | ICD-10-CM | POA: Insufficient documentation

## 2018-02-23 DIAGNOSIS — R350 Frequency of micturition: Secondary | ICD-10-CM | POA: Insufficient documentation

## 2018-02-23 DIAGNOSIS — N762 Acute vulvitis: Secondary | ICD-10-CM | POA: Insufficient documentation

## 2018-02-23 DIAGNOSIS — N390 Urinary tract infection, site not specified: Secondary | ICD-10-CM | POA: Insufficient documentation

## 2018-02-23 DIAGNOSIS — B351 Tinea unguium: Secondary | ICD-10-CM | POA: Diagnosis not present

## 2018-02-23 DIAGNOSIS — M79676 Pain in unspecified toe(s): Secondary | ICD-10-CM | POA: Diagnosis not present

## 2018-02-23 DIAGNOSIS — M8580 Other specified disorders of bone density and structure, unspecified site: Secondary | ICD-10-CM | POA: Insufficient documentation

## 2018-02-23 DIAGNOSIS — R32 Unspecified urinary incontinence: Secondary | ICD-10-CM | POA: Insufficient documentation

## 2018-02-23 DIAGNOSIS — L600 Ingrowing nail: Secondary | ICD-10-CM | POA: Diagnosis not present

## 2018-02-23 DIAGNOSIS — G4733 Obstructive sleep apnea (adult) (pediatric): Secondary | ICD-10-CM

## 2018-02-23 DIAGNOSIS — B372 Candidiasis of skin and nail: Secondary | ICD-10-CM | POA: Insufficient documentation

## 2018-02-23 NOTE — Progress Notes (Signed)
She presents today after having not seen her for a couple of years.  She would like to consider having surgery she was most of had in the first place.  Objective: Vital signs are stable she is alert and oriented x3.  Pulses are palpable.  She still has thick sharp incurvated nail margin along the tibiofibular border of the hallux nails.  He still has pain on palpation to the hallux nails.  I reviewed old radiographs which do demonstrate subungual exostosis.  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  Plan: We discussed etiology pathology conservative versus surgical therapies at this point we consented her for subungual exostectomy and surgical matrixectomy to the tibiofibular border hallux bilateral.  She was provided with both oral and written home-going instruction for care and preop of her foot.  Also she received information regarding the surgery center and anesthesia.  I will follow-up with her in the near future for surgical intervention.

## 2018-02-23 NOTE — Patient Instructions (Signed)
Pre-Operative Instructions  Congratulations, you have decided to take an important step towards improving your quality of life.  You can be assured that the doctors and staff at [facility] will be with you every step of the way.  Here are some important things you should know:  1. Plan to be at the surgery center/hospital at least 1 (one) hour prior to your scheduled time, unless otherwise directed by the surgical center/hospital staff.  You must have a responsible adult accompany you, remain during the surgery and drive you home.  Make sure you have directions to the surgical center/hospital to ensure you arrive on time. 2. If you are having surgery at [facility] or [facility] hospitals, you will need a copy of your medical history and physical form from your family physician within one month prior to the date of surgery. We will give you a form for your primary physician to complete.  3. We make every effort to accommodate the date you request for surgery.  However, there are times where surgery dates or times have to be moved.  We will contact you as soon as possible if a change in schedule is required.   4. No aspirin/ibuprofen for one week before surgery.  If you are on aspirin, any non-steroidal anti-inflammatory medications (Mobic, Aleve, Ibuprofen) should not be taken seven (7) days prior to your surgery.  You make take Tylenol for pain prior to surgery.  5. Medications - If you are taking daily heart and blood pressure medications, seizure, reflux, allergy, asthma, anxiety, pain or diabetes medications, make sure you notify the surgery center/hospital before the day of surgery so they can tell you which medications you should take or avoid the day of surgery. 6. No food or drink after midnight the night before surgery unless directed otherwise by surgical center/hospital staff. 7. No alcoholic beverages 24-hours prior to surgery.  No smoking 24-hours prior or 24-hours after  surgery. 8. Wear loose pants or shorts. They should be loose enough to fit over bandages, boots, and casts. 9. Don't wear slip-on shoes. Sneakers are preferred. 10. Bring your boot with you to the surgery center/hospital.  Also bring crutches or a walker if your physician has prescribed it for you.  If you do not have this equipment, it will be provided for you after surgery. 11. If you have not been contacted by the surgery center/hospital by the day before your surgery, call to confirm the date and time of your surgery. 12. Leave-time from work may vary depending on the type of surgery you have.  Appropriate arrangements should be made prior to surgery with your employer. 13. Prescriptions will be provided immediately following surgery by your doctor.  Fill these as soon as possible after surgery and take the medication as directed. Pain medications will not be refilled on weekends and must be approved by the doctor. 14. Remove nail polish on the operative foot and avoid getting pedicures prior to surgery. 15. Wash the night before surgery.  The night before surgery wash the foot and leg well with water and the antibacterial soap provided. Be sure to pay special attention to beneath the toenails and in between the toes.  Wash for at least three (3) minutes. Rinse thoroughly with water and dry well with a towel.  Perform this wash unless told not to do so by your physician.  Enclosed: 1 Ice pack (please put in freezer the night before surgery)   1 Hibiclens skin cleaner     Pre-op instructions  If you have any questions regarding the instructions, please do not hesitate to call our office.  [place]: [address], [place], [place] [address] -- [phone]  [place]: [address] [place], [place] [address] -- [phone]  [place]: [address]  [place], [place] [address] -- [phone]  [place]: [address], [address], [place], [place] [address] -- [phone]  Website:  [email or web address] 

## 2018-02-23 NOTE — Telephone Encounter (Signed)
Attempted to call patient, no answer, left message for patient to call back.  

## 2018-02-23 NOTE — Telephone Encounter (Signed)
CPAP titration 02/21/18 >> CPAP 10 cm H2O; didn't need supplemental oxygen.   Please let her know that sleep study showed good control of sleep apnea and oxygen level with CPAP 10 cm H2O.  Will discuss in more detail at [facility] in April.  She should continue using CPAP at night, and doesn't need supplemental oxygen.

## 2018-02-23 NOTE — Procedures (Signed)
    Patient Name: Cynthia Kirk, Hartmann Date: 02/21/2018   Gender: Female  D.O.B: Apr 18, 1951  Age (years): 31  Referring Provider: Chesley Mires MD, ABSM  Height (inches): 64.25  Interpreting Physician: Chesley Mires MD, ABSM  Weight (lbs): 229  RPSGT: Carolin Coy  BMI: 40  MRN: [id number]  Neck Size: 13.50   CLINICAL INFORMATION  67 yr old female with history of pulmonary sarcoidosis and obstructive sleep apnea. She had recent overnight oximetry that showed low oxygen level while using CPAP. She presents for a CPAP titration study. Date of NPSG, Split Night or HST: 05/28/14, AHI 5.8. SLEEP STUDY TECHNIQUE  As per the AASM Manual for the Scoring of Sleep and Associated Events v2.3 (April 2016) with a hypopnea requiring 4% desaturations. The channels recorded and monitored were frontal, central and occipital EEG, electrooculogram (EOG), submentalis EMG (chin), nasal and oral airflow, thoracic and abdominal wall motion, anterior tibialis EMG, snore microphone, electrocardiogram, and pulse oximetry. Continuous positive airway pressure (CPAP) was initiated at the beginning of the study and titrated to treat sleep-disordered breathing. MEDICATIONS  Medications self-administered by patient taken the night of the study : XANAX, BYSTOLIC, NEURONTIN, SINGULAIR, ZANTAC TECHNICIAN COMMENTS  Comments added by technician: PATIENT WAS ORDERED AS A CPAP TITRATION Comments added by scorer: N/A  RESPIRATORY PARAMETERS  Optimal PAP Pressure (cm): 10 AHI at Optimal Pressure (/hr): 0.0  Overall Minimal O2 (%): 91.0 Supine % at Optimal Pressure (%): 100  Minimal O2 at Optimal Pressure (%): 95.0      SLEEP ARCHITECTURE  The study was initiated at 10:37:40 PM and ended at 4:54:55 AM. Sleep onset time was 65.8 minutes and the sleep efficiency was 70.6%%. The total sleep time was 266.5 minutes. The patient spent 12.9%% of the night in stage N1 sleep, 80.3%% in stage N2 sleep, 0.0%% in stage N3 and 6.75%  in REM.Stage REM latency was 100.5 minutes Wake after sleep onset was 45.0. Alpha intrusion was absent. Supine sleep was 83.86%. CARDIAC DATA  The 2 lead EKG demonstrated sinus rhythm. The mean heart rate was 87.0 beats per minute. Other EKG findings include: PVCs.  LEG MOVEMENT DATA  The total Periodic Limb Movements of Sleep (PLMS) were 0. The PLMS index was 0.0. A PLMS index of <15 is considered normal in adults. IMPRESSIONS  - She did well with CPAP 10 cm H2O. At this pressure setting she did not require supplemental oxygen. DIAGNOSIS  - Obstructive Sleep Apnea (327.23 [G47.33 ICD-10]) RECOMMENDATIONS  - Continue CPAP therapy on 10 cm H2O with a Medium size Resmed Nasal CPAP Mask AirFit N30i mask and heated humidification.  [Electronically signed] 02/23/2018 03:17 PM Chesley Mires MD, [place], American Board of Sleep Medicine  NPI: [id number]

## 2018-02-24 NOTE — Telephone Encounter (Signed)
Patient is aware and verbalized understanding nothing further needed.

## 2018-02-25 ENCOUNTER — Other Ambulatory Visit: Payer: Self-pay | Admitting: Internal Medicine

## 2018-02-28 ENCOUNTER — Ambulatory Visit: Payer: Medicare Other | Admitting: Internal Medicine

## 2018-03-07 ENCOUNTER — Ambulatory Visit: Payer: Medicare Other | Admitting: Internal Medicine

## 2018-03-07 ENCOUNTER — Telehealth: Payer: Self-pay | Admitting: Pulmonary Disease

## 2018-03-07 ENCOUNTER — Other Ambulatory Visit: Payer: Self-pay | Admitting: Gastroenterology

## 2018-03-07 NOTE — Telephone Encounter (Signed)
Called and spoke with patient. She states that she has been having a problem getting her supplies from them. Patient states she has been receiving emails from them in regards to surveys for her supplies. Patient is really confused and upset about this.    Called and spoke with [place] from [facility]. He states that there is no documentation that patient has tried reaching out to them. He states that he will have the CPAP team reach out to patient in regards to getting supplies.  Advised patient of this. She verbalized understanding.

## 2018-03-08 ENCOUNTER — Telehealth: Payer: Self-pay | Admitting: *Deleted

## 2018-03-08 MED ORDER — GABAPENTIN 100 MG PO CAPS: 100.0000 mg | ORAL_CAPSULE | Freq: Every day | ORAL | 7 refills | Status: DC

## 2018-03-08 NOTE — Telephone Encounter (Signed)
Left message informing pt I had not been able to get gabapentin script faxed to [facility], to call me with alternate fax or contact number for [facility].

## 2018-03-08 NOTE — Telephone Encounter (Signed)
Pt requested refill of the Gabapentin 100mg  and request to be faxed to [facility]. Dr. Milinda Pointer states refill as previously.

## 2018-03-08 NOTE — Telephone Encounter (Addendum)
Faxed copy of Gabapentin 100mg  #30 one tablet at HS +7refills to [facility]. Unable to fax to [place] x2.

## 2018-03-09 MED ORDER — GABAPENTIN 100 MG PO CAPS: 100.0000 mg | ORAL_CAPSULE | Freq: Every day | ORAL | 3 refills | Status: DC

## 2018-03-09 NOTE — Telephone Encounter (Signed)
Pt states she called [facility] and they gave her 2 other fax numbers, [phone], and [phone].

## 2018-03-09 NOTE — Telephone Encounter (Addendum)
Faxed Gabapentin rx to [facility] did not go through called rx in.

## 2018-03-09 NOTE — Telephone Encounter (Signed)
Fax did not go through on the number pt gave, I called the [phone] and gave the Gabapentin rx to [facility] pharmacist - [place] for 90 days +3refills.

## 2018-03-14 NOTE — Patient Instructions (Signed)
  Test(s) ordered today. Your results will be released to MyChart (or called to you) after review, usually within 72hours after test completion. If any changes need to be made, you will be notified at that same time.  All other Health Maintenance issues reviewed.   All recommended immunizations and age-appropriate screenings are up-to-date or discussed.  No immunizations administered today.   Medications reviewed and updated.  Changes include  /  No changes recommended at this time.  Your prescription(s) have been submitted to your pharmacy. Please take as directed and contact our office if you believe you are having problem(s) with the medication(s).  A referral was ordered for   Please followup in    

## 2018-03-14 NOTE — Progress Notes (Signed)
Subjective:    Patient ID: Cynthia Kirk, female    DOB: June 14, 1951, 67 y.o.   MRN: [id number]  HPI The patient is here for follow up.   Patient Active Problem List   Diagnosis Date Noted  . Atrophic vaginitis 02/23/2018  . Bacterial vaginosis 02/23/2018  . Candidiasis of skin 02/23/2018  . Increased frequency of urination 02/23/2018  . Menopause present 02/23/2018  . Postmenopausal bleeding 02/23/2018  . Osteopenia 02/23/2018  . Tenderness 02/23/2018  . Urinary incontinence 02/23/2018  . Urinary tract infectious disease 02/23/2018  . Vulvitis 02/23/2018  . Right shoulder pain 08/16/2017  . Vasomotor rhinitis 07/26/2017  . Acute sinusitis 04/19/2017  . Anxiety 02/01/2017  . Acute pain of left shoulder 02/01/2017  . Renal cyst 07/23/2016  . Situational depression 06/24/2016  . Cervical disc disorder with radiculopathy of cervical region 03/13/2016  . B12 deficiency   . Cataract 02/25/2014  . Glaucoma suspect 02/25/2014  . Lumbar spinal stenosis 03/07/2013  . Ejection fraction   . Syncope   . Severe obesity (BMI >= 40) ([place]) 09/14/2010  . Essential hypertension 09/14/2010  . ANEMIA-IRON DEFICIENCY 03/20/2010  . GERD 08/22/2009  . Pulmonary sarcoidosis ([place]) 06/30/2009  . Diabetes mellitus ([place]) 06/30/2009  . OSA on CPAP 06/30/2009  . Upper airway cough syndrome 06/30/2009  . Mild persistent asthma 06/30/2009    Current Outpatient Medications on File Prior to Visit  Medication Sig Dispense Refill  . albuterol (PROVENTIL HFA;VENTOLIN HFA) 108 (90 Base) MCG/ACT inhaler Inhale 2 puffs into the lungs every 6 (six) hours as needed for wheezing or shortness of breath. 3 Inhaler 3  . albuterol (PROVENTIL) (2.5 MG/3ML) 0.083% nebulizer solution Take 3 mLs (2.5 mg total) by nebulization every 6 (six) hours as needed for shortness of breath. DX: J45.998 1080 mL 3  . ALPRAZolam (XANAX) 1 MG tablet TAKE 1 TABLET TWICE A DAY  AS NEEDED 180 tablet 0  . azithromycin (ZITHROMAX)  250 MG tablet Take 1 every day until finished. 4 tablet 0  . BYSTOLIC 5 MG tablet TAKE 1 TABLET DAILY 90 tablet 3  . Coenzyme Q10 (COQ10) 100 MG CAPS Take 1 capsule by mouth daily.    . cyclobenzaprine (FLEXERIL) 10 MG tablet TAKE 0.5-1 TABLETS (5-10 MG TOTAL) BY MOUTH 3 (THREE) TIMES DAILY AS NEEDED FOR MUSCLE SPASMS. 90 tablet 0  . DEXILANT 60 MG capsule TAKE 1 CAPSULE DAILY 90 capsule 0  . fluticasone (FLOVENT HFA) 44 MCG/ACT inhaler Inhale 2 puffs into the lungs 2 (two) times daily. 3 Inhaler 3  . gabapentin (NEURONTIN) 100 MG capsule TAKE 1 CAPSULE AT BEDTIME 30 capsule 7  . gabapentin (NEURONTIN) 100 MG capsule Take 1 capsule (100 mg total) by mouth at bedtime. 90 capsule 3  . HYDROcodone-acetaminophen (NORCO) 10-325 MG tablet Take 1 tablet by mouth every 6 (six) hours as needed. (Patient not taking: Reported on 12/27/2017) 20 tablet 0  . hydroquinone 4 % cream Apply topically 2 (two) times daily. 28.35 g 2  . ipratropium (ATROVENT) 0.06 % nasal spray Place 2 sprays into the nose daily.    . Lactobacillus (CVS ACIDOPHILUS PROBIOTIC) TABS Take 1 tablet by mouth 2 (two) times daily.  0  . Liraglutide (VICTOZA) 18 MG/3ML SOPN INJECT 1.8 MG SQ DAILY 9 pen 2  . mometasone (ASMANEX 60 METERED DOSES) 220 MCG/INH inhaler INHALE 2 PUFFS INTO THE LUNGS DAILY. (Patient not taking: Reported on 12/27/2017) 1 Inhaler 6  . montelukast (SINGULAIR) 10 MG tablet TAKE 1 TABLET  AT BEDTIME 90 tablet 1  . Multiple Vitamins-Minerals (WOMENS 50+ MULTI VITAMIN/MIN) TABS Take 1 tablet by mouth daily.    [name] olmesartan-hydrochlorothiazide ([name] HCT) 40-25 MG tablet Take 1 tablet by mouth daily. 30 tablet 0  . olmesartan-hydrochlorothiazide ([name] HCT) 40-25 MG tablet TAKE 1 TABLET DAILY 90 tablet 1  . oxyCODONE-acetaminophen (PERCOCET/ROXICET) 5-325 MG tablet Take 2 tablets by mouth every 4 (four) hours as needed for severe pain. 15 tablet 0  . Probiotic Product (PROBIOTIC & ACIDOPHILUS EX ST) CAPS Take 1 capsule by  mouth 2 (two) times daily. 14 capsule 0  . ranitidine (ZANTAC) 300 MG tablet TAKE 1 TABLET (300 MG TOTAL) BY MOUTH TWICE DAILY 180 tablet 3   No current facility-administered medications on file prior to visit.     Past Medical History:  Diagnosis Date  . Abnormal Pap smear 2008  . Adenomatous colon polyp   . Allergic rhinitis, cause unspecified   . Anxiety   . ASCUS with positive high risk HPV   . Blood transfusion 1976   Due to ectopic pregnancy  . Chronic back pain   . Ejection fraction    EF 45-50%, echo, October 27, 2011  . Endometrial mass 2011  . Esophageal reflux   . Fibroid   . H/O Clostridium difficile infection 03/2010  . H/O menorrhagia 2011  . Herpes simplex type 2 infection complicating pregnancy   . Hiatal hernia   . Iron deficiency anemia   . Morbid obesity ([place]) 2010  . Obstructive sleep apnea (adult) (pediatric)    cpap  . Panic attacks   . Pneumonia 2005; 2007; 2009; 2011  . Sarcoidosis 1996   @ [employer]  . Simple endometrial hyperplasia 2011  . Spinal stenosis, lumbar    s/p decompression laminectomy 09/2013  . Syncope    ?? Syncope ??  . Type 2 diabetes, diet controlled ([place])   . Unspecified asthma(493.90)   . Urinary incontinence, mixed 2011  . VIN II (vulvar intraepithelial neoplasia II)     Past Surgical History:  Procedure Laterality Date  . COLPOSCOPY VULVA W/ BIOPSY     "I've had maybe 3" (09/05/2013)  . DILATION AND CURETTAGE OF UTERUS  2007  . [place]; ~ 1978  . HYSTEROSCOPY DIAGNOSTIC  2011  . LUMBAR LAMINECTOMY/DECOMPRESSION MICRODISCECTOMY N/A 09/05/2013   Procedure: Thoracic eleven-twelve Thoracic Laminectomy;  Surgeon: Eustace Moore, MD;  Location: [facility];  Service: Neurosurgery;  Laterality: N/A;  Thoracic eleven-twelve Thoracic Laminectomy  . MAXIMUM ACCESS (MAS)POSTERIOR LUMBAR INTERBODY FUSION (PLIF) 1 LEVEL N/A 09/05/2013   Procedure: Lumbar four-five Maximum Access Surgery  Posterior lumbar interbody  fusion;  Surgeon: Eustace Moore, MD;  Location: [facility];  Service: Neurosurgery;  Laterality: N/A;  Lumbar four-five Maximum Access Surgery  Posterior lumbar interbody fusion  . POSTERIOR LAMINECTOMY / DECOMPRESSION LUMBAR SPINE  09/05/2013  . POSTERIOR LUMBAR FUSION  09/05/2013  . THORACIC LAMINECTOMY  09/05/2013  . URETHRAL SLING  2011  . VULVECTOMY  ~ 2007   "for cancerous cells" (09/05/2013)    Social History   Socioeconomic History  . Marital status: Married    Spouse name: Not on file  . Number of children: 2  . Years of education: PHD  . Highest education level: Not on file  Occupational History  . Occupation: retired    Fish farm manager: UNEMPLOYED  Social Needs  . Financial resource strain: Not on file  . Food insecurity:    Worry: Not on  file    Inability: Not on file  . Transportation needs:    Medical: Not on file    Non-medical: Not on file  Tobacco Use  . Smoking status: Never Smoker  . Smokeless tobacco: Never Used  Substance and Sexual Activity  . Alcohol use: Yes    Comment: 2018 - very occasionally  . Drug use: No  . Sexual activity: Yes    Birth control/protection: Post-menopausal  Lifestyle  . Physical activity:    Days per week: Not on file    Minutes per session: Not on file  . Stress: Not on file  Relationships  . Social connections:    Talks on phone: Not on file    Gets together: Not on file    Attends religious service: Not on file    Active member of club or organization: Not on file    Attends meetings of clubs or organizations: Not on file    Relationship status: Not on file  Other Topics Concern  . Not on file  Social History Narrative   Pt lives at home with her spouse.   She does not use caffeine.    Family History  Problem Relation Age of Onset  . Heart attack Father   . Hypertension Father   . Diabetes Father   . Seizures Father   . Migraines Father   . Kidney disease Sister   . Osteoporosis Sister   . Heart attack Sister     . Cancer Paternal Uncle   . Asthma Daughter   . Asthma Son   . Osteoporosis Sister   . Lung cancer Maternal Uncle   . Kidney disease Other   . Diabetes Other   . High Cholesterol Brother        x2    Review of Systems     Objective:  There were no vitals filed for this visit. BP Readings from Last 3 Encounters:  12/27/17 136/90  11/22/17 124/76  08/16/17 (!) 144/86   Wt Readings from Last 3 Encounters:  02/21/18 229 lb (103.9 kg)  12/27/17 226 lb (102.5 kg)  11/22/17 233 lb 9.6 oz (106 kg)   There is no height or weight on file to calculate BMI.   Physical Exam    Constitutional: Appears well-developed and well-nourished. No distress.  HENT:  Head: Normocephalic and atraumatic.  Neck: Neck supple. No tracheal deviation present. No thyromegaly present.  No cervical lymphadenopathy Cardiovascular: Normal rate, regular rhythm and normal heart sounds.   No murmur heard. No carotid bruit .  No edema Pulmonary/Chest: Effort normal and breath sounds normal. No respiratory distress. No has no wheezes. No rales.  Skin: Skin is warm and dry. Not diaphoretic.  Psychiatric: Normal mood and affect. Behavior is normal.      Assessment & Plan:    See Problem List for Assessment and Plan of chronic medical problems.   This encounter was created in error - please disregard.

## 2018-03-15 ENCOUNTER — Ambulatory Visit: Payer: Medicare Other | Admitting: Gastroenterology

## 2018-03-15 ENCOUNTER — Encounter: Payer: Medicare Other | Admitting: Internal Medicine

## 2018-03-15 DIAGNOSIS — Z0289 Encounter for other administrative examinations: Secondary | ICD-10-CM

## 2018-03-15 NOTE — Patient Instructions (Addendum)

## 2018-03-15 NOTE — Progress Notes (Signed)
Subjective:    Patient ID: Cynthia Kirk, female    DOB: 09/09/1951, 67 y.o.   MRN: [id number]  HPI The patient is here for follow up.  Hypertension: She is taking her medication daily. She is compliant with a low sodium diet.  She denies chest pain, palpitations, edema, shortness of breath and regular headaches. She is exercising regularly.     Diabetes: She is taking victoza daily as prescribed. She is compliant with a diabetic diet. She is exercising regularly.  She wants to continue the victoza.   Anxiety:  Her anxiety flares especially related to family and friends dying.  She takes the alprazolam at night.  She occasionally takes it during the day.   GERD:  She is taking her medication daily as prescribed.  She denies any GERD symptoms and feels her GERD is well controlled.   Dry cough, hoarseness: it started about one week ago and is intermittent.    Rash on left lower anterior leg:  She had this in the past on her other leg.  She used a cream then and it helped.  It itches on occasion.   She will be having surgery on her toes next month - surgery for her bones spurs.    Medications and allergies reviewed with patient and updated if appropriate.  Patient Active Problem List   Diagnosis Date Noted  . Atrophic vaginitis 02/23/2018  . Bacterial vaginosis 02/23/2018  . Candidiasis of skin 02/23/2018  . Increased frequency of urination 02/23/2018  . Menopause present 02/23/2018  . Postmenopausal bleeding 02/23/2018  . Osteopenia 02/23/2018  . Tenderness 02/23/2018  . Urinary incontinence 02/23/2018  . Urinary tract infectious disease 02/23/2018  . Vulvitis 02/23/2018  . Right shoulder pain 08/16/2017  . Vasomotor rhinitis 07/26/2017  . Anxiety 02/01/2017  . Acute pain of left shoulder 02/01/2017  . Renal cyst 07/23/2016  . Situational depression 06/24/2016  . Cervical disc disorder with radiculopathy of cervical region 03/13/2016  . B12 deficiency   . Cataract  02/25/2014  . Glaucoma suspect 02/25/2014  . Lumbar spinal stenosis 03/07/2013  . Ejection fraction   . Syncope   . Severe obesity (BMI >= 40) ([place]) 09/14/2010  . Essential hypertension 09/14/2010  . ANEMIA-IRON DEFICIENCY 03/20/2010  . GERD 08/22/2009  . Pulmonary sarcoidosis ([place]) 06/30/2009  . Diabetes mellitus ([place]) 06/30/2009  . OSA on CPAP 06/30/2009  . Upper airway cough syndrome 06/30/2009  . Mild persistent asthma 06/30/2009    Current Outpatient Medications on File Prior to Visit  Medication Sig Dispense Refill  . albuterol (PROVENTIL HFA;VENTOLIN HFA) 108 (90 Base) MCG/ACT inhaler Inhale 2 puffs into the lungs every 6 (six) hours as needed for wheezing or shortness of breath. 3 Inhaler 3  . albuterol (PROVENTIL) (2.5 MG/3ML) 0.083% nebulizer solution Take 3 mLs (2.5 mg total) by nebulization every 6 (six) hours as needed for shortness of breath. DX: J45.998 1080 mL 3  . ALPRAZolam (XANAX) 1 MG tablet TAKE 1 TABLET TWICE A DAY  AS NEEDED 180 tablet 0  . BYSTOLIC 5 MG tablet TAKE 1 TABLET DAILY 90 tablet 3  . Coenzyme Q10 (COQ10) 100 MG CAPS Take 1 capsule by mouth daily.    . cyclobenzaprine (FLEXERIL) 10 MG tablet TAKE 0.5-1 TABLETS (5-10 MG TOTAL) BY MOUTH 3 (THREE) TIMES DAILY AS NEEDED FOR MUSCLE SPASMS. 90 tablet 0  . DEXILANT 60 MG capsule TAKE 1 CAPSULE DAILY 90 capsule 0  . fluticasone (FLOVENT HFA) 44 MCG/ACT inhaler Inhale 2  puffs into the lungs 2 (two) times daily. 3 Inhaler 3  . gabapentin (NEURONTIN) 100 MG capsule Take 1 capsule (100 mg total) by mouth at bedtime. 90 capsule 3  . hydroquinone 4 % cream Apply topically 2 (two) times daily. 28.35 g 2  . ipratropium (ATROVENT) 0.06 % nasal spray Place 2 sprays into the nose daily.    . Lactobacillus (CVS ACIDOPHILUS PROBIOTIC) TABS Take 1 tablet by mouth 2 (two) times daily.  0  . Liraglutide (VICTOZA) 18 MG/3ML SOPN INJECT 1.8 MG SQ DAILY 9 pen 2  . montelukast (SINGULAIR) 10 MG tablet TAKE 1 TABLET AT BEDTIME 90  tablet 1  . Multiple Vitamins-Minerals (WOMENS 50+ MULTI VITAMIN/MIN) TABS Take 1 tablet by mouth daily.    [name] olmesartan-hydrochlorothiazide ([name] HCT) 40-25 MG tablet TAKE 1 TABLET DAILY 90 tablet 1  . Probiotic Product (PROBIOTIC & ACIDOPHILUS EX ST) CAPS Take 1 capsule by mouth 2 (two) times daily. 14 capsule 0  . ranitidine (ZANTAC) 300 MG tablet TAKE 1 TABLET (300 MG TOTAL) BY MOUTH TWICE DAILY 180 tablet 3   No current facility-administered medications on file prior to visit.     Past Medical History:  Diagnosis Date  . Abnormal Pap smear 2008  . Adenomatous colon polyp   . Allergic rhinitis, cause unspecified   . Anxiety   . ASCUS with positive high risk HPV   . Blood transfusion 1976   Due to ectopic pregnancy  . Chronic back pain   . Ejection fraction    EF 45-50%, echo, October 27, 2011  . Endometrial mass 2011  . Esophageal reflux   . Fibroid   . H/O Clostridium difficile infection 03/2010  . H/O menorrhagia 2011  . Herpes simplex type 2 infection complicating pregnancy   . Hiatal hernia   . Iron deficiency anemia   . Morbid obesity ([place]) 2010  . Obstructive sleep apnea (adult) (pediatric)    cpap  . Panic attacks   . Pneumonia 2005; 2007; 2009; 2011  . Sarcoidosis 1996   @ [employer]  . Simple endometrial hyperplasia 2011  . Spinal stenosis, lumbar    s/p decompression laminectomy 09/2013  . Syncope    ?? Syncope ??  . Type 2 diabetes, diet controlled ([place])   . Unspecified asthma(493.90)   . Urinary incontinence, mixed 2011  . VIN II (vulvar intraepithelial neoplasia II)     Past Surgical History:  Procedure Laterality Date  . COLPOSCOPY VULVA W/ BIOPSY     "I've had maybe 3" (09/05/2013)  . DILATION AND CURETTAGE OF UTERUS  2007  . [place]; ~ 1978  . HYSTEROSCOPY DIAGNOSTIC  2011  . LUMBAR LAMINECTOMY/DECOMPRESSION MICRODISCECTOMY N/A 09/05/2013   Procedure: Thoracic eleven-twelve Thoracic Laminectomy;  Surgeon: Eustace Moore, MD;   Location: [facility];  Service: Neurosurgery;  Laterality: N/A;  Thoracic eleven-twelve Thoracic Laminectomy  . MAXIMUM ACCESS (MAS)POSTERIOR LUMBAR INTERBODY FUSION (PLIF) 1 LEVEL N/A 09/05/2013   Procedure: Lumbar four-five Maximum Access Surgery  Posterior lumbar interbody fusion;  Surgeon: Eustace Moore, MD;  Location: [place] [facility];  Service: Neurosurgery;  Laterality: N/A;  Lumbar four-five Maximum Access Surgery  Posterior lumbar interbody fusion  . POSTERIOR LAMINECTOMY / DECOMPRESSION LUMBAR SPINE  09/05/2013  . POSTERIOR LUMBAR FUSION  09/05/2013  . THORACIC LAMINECTOMY  09/05/2013  . URETHRAL SLING  2011  . VULVECTOMY  ~ 2007   "for cancerous cells" (09/05/2013)    Social History   Socioeconomic History  .  Marital status: Married    Spouse name: Not on file  . Number of children: 2  . Years of education: PHD  . Highest education level: Not on file  Occupational History  . Occupation: retired    Fish farm manager: UNEMPLOYED  Social Needs  . Financial resource strain: Not on file  . Food insecurity:    Worry: Not on file    Inability: Not on file  . Transportation needs:    Medical: Not on file    Non-medical: Not on file  Tobacco Use  . Smoking status: Never Smoker  . Smokeless tobacco: Never Used  Substance and Sexual Activity  . Alcohol use: Yes    Comment: 2018 - very occasionally  . Drug use: No  . Sexual activity: Yes    Birth control/protection: Post-menopausal  Lifestyle  . Physical activity:    Days per week: Not on file    Minutes per session: Not on file  . Stress: Not on file  Relationships  . Social connections:    Talks on phone: Not on file    Gets together: Not on file    Attends religious service: Not on file    Active member of club or organization: Not on file    Attends meetings of clubs or organizations: Not on file    Relationship status: Not on file  Other Topics Concern  . Not on file  Social History Narrative   Pt lives at home with her  spouse.   She does not use caffeine.    Family History  Problem Relation Age of Onset  . Heart attack Father   . Hypertension Father   . Diabetes Father   . Seizures Father   . Migraines Father   . Kidney disease Sister   . Osteoporosis Sister   . Heart attack Sister   . Cancer Paternal Uncle   . Asthma Daughter   . Asthma Son   . Osteoporosis Sister   . Lung cancer Maternal Uncle   . Kidney disease Other   . Diabetes Other   . High Cholesterol Brother        x2    Review of Systems  Constitutional: Negative for chills and fever.  HENT: Positive for congestion, postnasal drip (sometimes) and voice change. Negative for ear pain, sinus pain and sore throat.   Respiratory: Positive for cough (dry) and wheezing (used inhaler). Negative for shortness of breath.   Cardiovascular: Negative for chest pain, palpitations and leg swelling.  Gastrointestinal: Negative for abdominal pain and nausea.  Skin: Positive for rash (left anterior lower let).  Neurological: Positive for headaches (this morning). Negative for dizziness and light-headedness.       Objective:   Vitals:   03/16/18 0959  BP: 124/74  Pulse: 66  Resp: 16  Temp: 97.6 F (36.4 C)  SpO2: 97%   BP Readings from Last 3 Encounters:  03/16/18 124/74  12/27/17 136/90  11/22/17 124/76   Wt Readings from Last 3 Encounters:  03/16/18 226 lb (102.5 kg)  02/21/18 229 lb (103.9 kg)  12/27/17 226 lb (102.5 kg)   Body mass index is 38.49 kg/m.   Physical Exam    Constitutional: Appears well-developed and well-nourished. No distress.  Head: Normocephalic and atraumatic.  HENT: B/l ear canals and TM normal.  Oropharynx w/o erythema. Neck supple. No tracheal deviation present. No thyromegaly present.  No cervical lymphadenopathy Cardiovascular: Normal rate, regular rhythm and normal heart sounds.   No murmur heard. No  carotid bruit .  No edema Pulmonary/Chest: Effort normal and breath sounds normal. No respiratory  distress. No has no wheezes. No rales.  Skin: Skin is warm and dry. Not diaphoretic. Darker papules on left anterior shin, no open wounds or surrounding erythema. Psychiatric: Normal mood and affect. Behavior is normal.      Assessment & Plan:    See Problem List for Assessment and Plan of chronic medical problems.

## 2018-03-16 ENCOUNTER — Ambulatory Visit (INDEPENDENT_AMBULATORY_CARE_PROVIDER_SITE_OTHER): Payer: Medicare Other | Admitting: Internal Medicine

## 2018-03-16 ENCOUNTER — Encounter: Payer: Self-pay | Admitting: Internal Medicine

## 2018-03-16 VITALS — BP 124/74 | HR 66 | Temp 97.6°F | Resp 16 | Ht 64.25 in | Wt 226.0 lb

## 2018-03-16 DIAGNOSIS — J069 Acute upper respiratory infection, unspecified: Secondary | ICD-10-CM

## 2018-03-16 DIAGNOSIS — F419 Anxiety disorder, unspecified: Secondary | ICD-10-CM

## 2018-03-16 DIAGNOSIS — E119 Type 2 diabetes mellitus without complications: Secondary | ICD-10-CM | POA: Diagnosis not present

## 2018-03-16 DIAGNOSIS — I10 Essential (primary) hypertension: Secondary | ICD-10-CM

## 2018-03-16 DIAGNOSIS — M48061 Spinal stenosis, lumbar region without neurogenic claudication: Secondary | ICD-10-CM

## 2018-03-16 DIAGNOSIS — M5010 Cervical disc disorder with radiculopathy, unspecified cervical region: Secondary | ICD-10-CM | POA: Diagnosis not present

## 2018-03-16 DIAGNOSIS — K219 Gastro-esophageal reflux disease without esophagitis: Secondary | ICD-10-CM

## 2018-03-16 MED ORDER — TRIAMCINOLONE ACETONIDE 0.5 % EX OINT: 1.0000 "application " | TOPICAL_OINTMENT | Freq: Two times a day (BID) | CUTANEOUS | 0 refills | Status: DC

## 2018-03-16 MED ORDER — CYCLOBENZAPRINE HCL 10 MG PO TABS: 5.0000 mg | ORAL_TABLET | Freq: Three times a day (TID) | ORAL | 0 refills | Status: DC | PRN

## 2018-03-16 NOTE — Assessment & Plan Note (Signed)
Likely viral Inhalers Q 8 hrs prn otc cold meds Rest, fluids

## 2018-03-16 NOTE — Assessment & Plan Note (Signed)
Taking flexeril as needed and gabapentin Will continue

## 2018-03-16 NOTE — Assessment & Plan Note (Addendum)
Taking alprazolam at night and sometimes 1/2 during the day for anxiety Discussed possible addiction and risk for memory loss Encouraged decreasing use of medication

## 2018-03-16 NOTE — Assessment & Plan Note (Signed)
Check a1c Taking victoza and she wants to continue it -- helping her  Maintain good sugars and her weight Will schedule eye exam

## 2018-03-16 NOTE — Assessment & Plan Note (Addendum)
GERD controlled following with GI Continue daily medication

## 2018-03-16 NOTE — Assessment & Plan Note (Signed)
BP well controlled Current regimen effective and well tolerated Continue current medications at current doses cmp  

## 2018-03-18 DIAGNOSIS — M13862 Other specified arthritis, left knee: Secondary | ICD-10-CM | POA: Diagnosis not present

## 2018-03-18 DIAGNOSIS — M13861 Other specified arthritis, right knee: Secondary | ICD-10-CM | POA: Diagnosis not present

## 2018-03-29 ENCOUNTER — Ambulatory Visit (INDEPENDENT_AMBULATORY_CARE_PROVIDER_SITE_OTHER): Payer: Medicare Other | Admitting: Pulmonary Disease

## 2018-03-29 ENCOUNTER — Other Ambulatory Visit (INDEPENDENT_AMBULATORY_CARE_PROVIDER_SITE_OTHER): Payer: Medicare Other

## 2018-03-29 ENCOUNTER — Ambulatory Visit (INDEPENDENT_AMBULATORY_CARE_PROVIDER_SITE_OTHER)
Admission: RE | Admit: 2018-03-29 | Discharge: 2018-03-29 | Disposition: A | Discharge status: Home / Self Care | Payer: Medicare Other | Source: Ambulatory Visit | Attending: Pulmonary Disease | Admitting: Pulmonary Disease

## 2018-03-29 ENCOUNTER — Encounter: Payer: Self-pay | Admitting: Pulmonary Disease

## 2018-03-29 VITALS — BP 110/80 | HR 89 | Ht 65.0 in | Wt 230.4 lb

## 2018-03-29 DIAGNOSIS — E119 Type 2 diabetes mellitus without complications: Secondary | ICD-10-CM | POA: Diagnosis not present

## 2018-03-29 DIAGNOSIS — D860 Sarcoidosis of lung: Secondary | ICD-10-CM | POA: Diagnosis not present

## 2018-03-29 DIAGNOSIS — I10 Essential (primary) hypertension: Secondary | ICD-10-CM

## 2018-03-29 DIAGNOSIS — Z9989 Dependence on other enabling machines and devices: Secondary | ICD-10-CM | POA: Diagnosis not present

## 2018-03-29 DIAGNOSIS — J45998 Other asthma: Secondary | ICD-10-CM

## 2018-03-29 DIAGNOSIS — G4733 Obstructive sleep apnea (adult) (pediatric): Secondary | ICD-10-CM

## 2018-03-29 DIAGNOSIS — R079 Chest pain, unspecified: Secondary | ICD-10-CM | POA: Diagnosis not present

## 2018-03-29 LAB — COMPREHENSIVE METABOLIC PANEL
ALBUMIN: 3.8 g/dL (ref 3.5–5.2)
ALK PHOS: 114 U/L (ref 39–117)
ALT: 11 U/L (ref 0–35)
AST: 20 U/L (ref 0–37)
BILIRUBIN TOTAL: 0.5 mg/dL (ref 0.2–1.2)
BUN: 20 mg/dL (ref 6–23)
CALCIUM: 9.4 mg/dL (ref 8.4–10.5)
CO2: 32 mEq/L (ref 19–32)
CREATININE: 0.9 mg/dL (ref 0.40–1.20)
Chloride: 102 mEq/L (ref 96–112)
GFR: 80.29 mL/min (ref >60.00)
Glucose, Bld: 90 mg/dL (ref 70–99)
Potassium: 3.9 mEq/L (ref 3.5–5.1)
Sodium: 138 mEq/L (ref 135–145)
Total Protein: 7 g/dL (ref 6.0–8.3)

## 2018-03-29 LAB — CBC WITH DIFFERENTIAL/PLATELET
BASOS ABS: 0 10*3/uL (ref 0.0–0.1)
BASOS PCT: 0.6 % (ref 0.0–3.0)
EOS ABS: 0.1 10*3/uL (ref 0.0–0.7)
Eosinophils Relative: 1.6 % (ref 0.0–5.0)
HEMATOCRIT: 38 % (ref 36.0–46.0)
HEMOGLOBIN: 12.6 g/dL (ref 12.0–15.0)
LYMPHS PCT: 19.9 % (ref 12.0–46.0)
Lymphs Abs: 1.4 10*3/uL (ref 0.7–4.0)
MCHC: 33.1 g/dL (ref 30.0–36.0)
MCV: 88.9 fl (ref 78.0–100.0)
MONOS PCT: 7.4 % (ref 3.0–12.0)
Monocytes Absolute: 0.5 10*3/uL (ref 0.1–1.0)
Neutro Abs: 5.1 10*3/uL (ref 1.4–7.7)
Neutrophils Relative %: 70.5 % (ref 43.0–77.0)
Platelets: 263 10*3/uL (ref 150.0–400.0)
RBC: 4.27 Mil/uL (ref 3.87–5.11)
RDW: 14.4 % (ref 11.5–15.5)
WBC: 7.2 10*3/uL (ref 4.0–10.5)

## 2018-03-29 LAB — LIPID PANEL
CHOL/HDL RATIO: 3
CHOLESTEROL: 168 mg/dL (ref 0–200)
HDL: 63.7 mg/dL (ref >39.00)
LDL CALC: 92 mg/dL (ref 0–99)
NonHDL: 104.06
TRIGLYCERIDES: 59 mg/dL (ref 0.0–149.0)
VLDL: 11.8 mg/dL (ref 0.0–40.0)

## 2018-03-29 LAB — HEMOGLOBIN A1C: HEMOGLOBIN A1C: 5.3 % (ref 4.6–6.5)

## 2018-03-29 NOTE — Patient Instructions (Signed)
Chest xray today  Follow up in 6 months 

## 2018-03-29 NOTE — Progress Notes (Signed)
[facility]  Chief Complaint  Patient presents with  . Follow-up    Pt having dizziness in last week, nausea, no vomiting, very tired more than normal. Pt is having allergies concerns, asthma stable, and pt states having some issues with cpap machine with the air and timing on the machine.    Vital signs: BP 110/80 (BP Location: Left Arm, Cuff Size: Normal)   Pulse 89   Ht 5\' 5"  (1.651 m)   Wt 230 lb 6.4 oz (104.5 kg)   LMP 02/14/2014   SpO2 98%   BMI 38.34 kg/m   History of Present Illness: Cynthia Kirk is a 67 y.o. female pulmonary sarcoidosis, asthma, and obstructive sleep apnea.  She was treated for pneumonia in January.  Doing better, but still feels fatigued.  Not having cough, wheeze, sputum, fever, skin rash, or hemoptysis.  Uses CPAP nightly.  She doesn't think her machine is working >> not recording data correctly.   Physical Exam:  General - pleasant Eyes - pupils reactive, wears glasses ENT - no sinus tenderness, no oral exudate, no LAN Cardiac - regular, no murmur Chest - no wheeze, rales Abd - soft, non tender Ext - no edema Skin - no rashes Neuro - normal strength Psych - normal mood   Assessment/Plan:  Pulmonary sarcoidosis. - stable - monitor clinically  Persistent asthma. - continue flovent, singulair and prn albuterol - will send to specialty pharmacy to get albuterol nebulizer through part B  Obstructive sleep apnea. - she is compliant with CPAP and reports benefit - continue CPAP 10 cm H2O - will have [facility] check why her CPAP isn't recording data  Recent pneumonia. - repeat chest xray today  Obesity. - discussed importance of weight loss   Patient Instructions  Chest xray today  Follow up in 6 months    Chesley Mires, MD [place] 03/29/2018, 3:35 PM Pager:  [phone]  Flow Sheet  Pulmonary tests: Spirometry 06/30/09 >> FEV1 1.69(87%), FVC 2.17(83%), FEV1%  78 CT chest 07/17/14 >> upper lobe predominant BTX, GGO, nodularity no change since 2011 PFT 11/15/14 >> FEV1 1.76 (87%), FEV1% 77, TLC 3.19 (63%), DLCO 74%, no BD PFT 12/27/17 >> FEV1 1.14 (59%), FEV1% 78, TLC 3.75 (74%), DLCO 61 HRCT chest 12/20/17 >> upper lobe fibrosis  Sleep tests: PSG 03/06/14 >> AHI 1.9, REM AHI 26.7, SaO2 low 80%. PSG 05/28/14 >> AHI 5.8, SaO2 low 85%. REM AHI 29.3 CPAP 10/19/17 to 11/17/17 >> used on 26 of 30 nights with average 6 hrs 28 min.  Average AHI 0.6 with CPAP 10 cm H2O ONO with CPAP 12/08/17 >> test time 2 hrs 26 min.  Average SpO2 94%, low SpO2 81%.  Spent 16.1 min with SpO2 < 88%. CPAP titration 02/21/18 >> CPAP 10 cm H2O; didn't need supplemental oxygen. CPAP 02/26/18 to 03/27/18 >> used on 22 of 30 nights with average 5 hrs 2 min.  Average AHI 2.4 with CPAP 10 cm H2O  Cardiac tests: Echo 03/09/17 >> EF 55 to 60%  Past Medical History: She  has a past medical history of Abnormal Pap smear (2008), Adenomatous colon polyp, Allergic rhinitis, cause unspecified, Anxiety, ASCUS with positive high risk HPV, Blood transfusion (1976), Chronic back pain, Ejection fraction, Endometrial mass (2011), Esophageal reflux, Fibroid, H/O Clostridium difficile infection (03/2010), H/O menorrhagia (2011), Herpes simplex type 2 infection complicating pregnancy, Hiatal hernia, Iron deficiency anemia, Morbid obesity ([place]) (2010), Obstructive sleep apnea (adult) (pediatric), Panic attacks, Pneumonia (2005; 2007;  2009; 2011), Sarcoidosis (1996), Simple endometrial hyperplasia (2011), Spinal stenosis, lumbar, Syncope, Type 2 diabetes, diet controlled ([place]), Unspecified asthma(493.90), Urinary incontinence, mixed (2011), and VIN II (vulvar intraepithelial neoplasia II).  Past Surgical History: She  has a past surgical history that includes Vulvectomy (~ 2007); Ectopic pregnancy surgery (1976; ~ 1978); Urethral sling (2011); Dilation and curettage of uterus (2007); Posterior laminectomy /  decompression lumbar spine (09/05/2013); Thoracic laminectomy (09/05/2013); Posterior lumbar fusion (09/05/2013); Hysteroscopy diagnostic (2011); Colposcopy vulva w/ biopsy; Maximum access (mas)posterior lumbar interbody fusion (plif) 1 level (N/A, 09/05/2013); and Lumbar laminectomy/decompression microdiscectomy (N/A, 09/05/2013).  Family History: Her family history includes Asthma in her daughter and son; Cancer in her paternal uncle; Diabetes in her father and other; Heart attack in her father and sister; High Cholesterol in her brother; Hypertension in her father; Kidney disease in her other and sister; Lung cancer in her maternal uncle; Migraines in her father; Osteoporosis in her sister and sister; Seizures in her father.  Social History: She  reports that she has never smoked. She has never used smokeless tobacco. She reports that she drinks alcohol. She reports that she does not use drugs.  Medications: Allergies as of 03/29/2018      Reactions   Aspirin    REACTION: hives   Ibuprofen    Upsets stomach   Latex    REACTION: rash      Medication List        Accurate as of 03/29/18  3:35 PM. Always use your most recent med list.          albuterol 108 (90 Base) MCG/ACT inhaler Commonly known as:  PROVENTIL HFA;VENTOLIN HFA Inhale 2 puffs into the lungs every 6 (six) hours as needed for wheezing or shortness of breath.   albuterol (2.5 MG/3ML) 0.083% nebulizer solution Commonly known as:  PROVENTIL Take 3 mLs (2.5 mg total) by nebulization every 6 (six) hours as needed for shortness of breath. DX: J45.998   ALPRAZolam 1 MG tablet Commonly known as:  XANAX TAKE 1 TABLET TWICE A DAY  AS NEEDED   BYSTOLIC 5 MG tablet Generic drug:  nebivolol TAKE 1 TABLET DAILY   CoQ10 100 MG Caps Take 1 capsule by mouth daily.   [employer] ACIDOPHILUS PROBIOTIC Tabs Take 1 tablet by mouth 2 (two) times daily.   cyclobenzaprine 10 MG tablet Commonly known as:  FLEXERIL Take 0.5-1 tablets (5-10  mg total) by mouth 3 (three) times daily as needed for muscle spasms.   DEXILANT 60 MG capsule Generic drug:  dexlansoprazole TAKE 1 CAPSULE DAILY   fluticasone 44 MCG/ACT inhaler Commonly known as:  FLOVENT HFA Inhale 2 puffs into the lungs 2 (two) times daily.   gabapentin 100 MG capsule Commonly known as:  NEURONTIN Take 1 capsule (100 mg total) by mouth at bedtime.   hydroquinone 4 % cream Apply topically 2 (two) times daily.   ipratropium 0.06 % nasal spray Commonly known as:  ATROVENT Place 2 sprays into the nose daily.   liraglutide 18 MG/3ML Sopn Commonly known as:  VICTOZA INJECT 1.8 MG SQ DAILY   montelukast 10 MG tablet Commonly known as:  SINGULAIR TAKE 1 TABLET AT BEDTIME   olmesartan-hydrochlorothiazide 40-25 MG tablet Commonly known as:  [name] HCT TAKE 1 TABLET DAILY   PROBIOTIC & ACIDOPHILUS EX ST Caps Take 1 capsule by mouth 2 (two) times daily.   ranitidine 300 MG tablet Commonly known as:  ZANTAC TAKE 1 TABLET (300 MG TOTAL) BY MOUTH TWICE DAILY  triamcinolone ointment 0.5 % Commonly known as:  KENALOG Apply 1 application topically 2 (two) times daily.   WOMENS 50+ MULTI VITAMIN/MIN Tabs Take 1 tablet by mouth daily.

## 2018-03-30 ENCOUNTER — Other Ambulatory Visit: Payer: Self-pay

## 2018-03-30 MED ORDER — ALBUTEROL SULFATE (2.5 MG/3ML) 0.083% IN NEBU: 2.5000 mg | INHALATION_SOLUTION | Freq: Four times a day (QID) | RESPIRATORY_TRACT | 3 refills | Status: DC | PRN

## 2018-03-30 NOTE — Telephone Encounter (Signed)
Sending RX of Albuterol nebulizer solution to [facility] in [place]; they will mail RX to pt Placed order today Nothing further needed at this time

## 2018-03-31 ENCOUNTER — Telehealth: Payer: Self-pay | Admitting: Pulmonary Disease

## 2018-03-31 ENCOUNTER — Encounter: Payer: Self-pay | Admitting: Internal Medicine

## 2018-03-31 NOTE — Telephone Encounter (Signed)
Dg Chest 2 View  Result Date: 03/30/2018 CLINICAL DATA:  Chest pain for 10 days.  History of sarcoidosis EXAM: CHEST - 2 VIEW COMPARISON:  December 27, 2017 FINDINGS: There is scarring in both upper lobe regions. There is no frank edema or consolidation evident. Heart size is normal. There is mild distortion of pulmonary vascularity due to the upper lobe cicatrization. No adenopathy evident. There is degenerative change in the thoracic spine. IMPRESSION: Scarring both upper lobes without frank edema or consolidation evident. No appreciable adenopathy. Stable cardiac silhouette. Electronically Signed   By: Lowella Grip III M.D.   On: 03/30/2018 08:51    Please let her know that her CXR shows expected changes from sarcoidosis.  Previous changes from pneumonia have resolved.

## 2018-04-03 NOTE — Telephone Encounter (Signed)
LMTCB x1 on preferred phone number listed for patient.  

## 2018-04-04 NOTE — Telephone Encounter (Signed)
Called and spoke with patient she is aware of results and verbalized understanding. Nothing further needed.  

## 2018-04-04 NOTE — Telephone Encounter (Signed)
Pt is returning call. Cb is [phone].

## 2018-04-11 ENCOUNTER — Telehealth: Payer: Self-pay | Admitting: Pulmonary Disease

## 2018-04-11 ENCOUNTER — Telehealth: Payer: Self-pay | Admitting: Gastroenterology

## 2018-04-11 ENCOUNTER — Telehealth: Payer: Self-pay | Admitting: Internal Medicine

## 2018-04-11 MED ORDER — MONTELUKAST SODIUM 10 MG PO TABS: 10.0000 mg | ORAL_TABLET | Freq: Every day | ORAL | 1 refills | Status: DC

## 2018-04-11 MED ORDER — DEXLANSOPRAZOLE 60 MG PO CPDR: 1.0000 | DELAYED_RELEASE_CAPSULE | Freq: Every day | ORAL | 0 refills | Status: DC

## 2018-04-11 MED ORDER — LIRAGLUTIDE 18 MG/3ML ~~LOC~~ SOPN
PEN_INJECTOR | SUBCUTANEOUS | 1 refills | Status: DC
Start: 2018-04-11 — End: 2018-07-21

## 2018-04-11 MED ORDER — RANITIDINE HCL 300 MG PO TABS: ORAL_TABLET | ORAL | 0 refills | Status: DC

## 2018-04-11 NOTE — Telephone Encounter (Signed)
Called and spoke with patient, advised her that [facility] was sending her the neb solution. Nothing further needed.

## 2018-04-11 NOTE — Telephone Encounter (Signed)
Prescriptions sent to the pharmacy.  

## 2018-04-11 NOTE — Telephone Encounter (Signed)
Called and spoke with Lauren from [facility]. He stated that since the [place] wanted to make sure that the prescription of albuterol neb needed to be filled. Advised that it did. Nothing further needed.

## 2018-04-11 NOTE — Telephone Encounter (Signed)
Alprazolam filled 2 months ago - it was written for 3 months so she should not be due until June

## 2018-04-11 NOTE — Telephone Encounter (Signed)
Copied from [place] [phone]. Topic: Quick Communication - Rx Refill/Question >> Apr 11, 2018 12:53 PM Lennox Solders wrote: Medication: victoza and pen needles, montelukast10 mg ,alprazolam 90 day supply with refills   Has the patient contacted their pharmacy no. Pt new pharm [facility]  phone [phone] and fax [phone]

## 2018-04-11 NOTE — Telephone Encounter (Signed)
Sent maintenance med pls advise on alprazolam Check [place] registry last filled 02/07/2018.Marland KitchenJohny Chess

## 2018-04-12 ENCOUNTER — Ambulatory Visit: Payer: Medicare Other

## 2018-04-13 NOTE — Telephone Encounter (Signed)
Can you close this. Thank you.

## 2018-04-15 DIAGNOSIS — M75101 Unspecified rotator cuff tear or rupture of right shoulder, not specified as traumatic: Secondary | ICD-10-CM | POA: Diagnosis not present

## 2018-04-17 ENCOUNTER — Telehealth: Payer: Self-pay | Admitting: *Deleted

## 2018-04-17 NOTE — Telephone Encounter (Signed)
I got a message from the surgical center that you wanted to cancel your surgery.  "I want to reschedule it to the fall."  Do you have a date in mind?  He does surgeries on Fridays.  "Let's do it on October 4."  I'll get it rescheduled.  You need to see Dr. Milinda Pointer prior to your surgery date for another consultation because your consent forms will have expired.  "Okay, that is fine."  Would you like to schedule that appointment now?  "Yes, that is fine.  Can I do it the Tuesday before my surgery date or should I do it the week before?"  The Tuesday before the surgery date is fine.  He can do it on October 1.  "Okay, that date is fine.  Can I get an early afternoon appointment?"  Yes, he can do it at 1:15 pm.  "I'll take it."  I called and left Caren Griffins a message to reschedule Bracy from 04/28/2018 to 09/08/2018.

## 2018-04-19 ENCOUNTER — Ambulatory Visit: Payer: Medicare Other | Admitting: Gastroenterology

## 2018-04-25 ENCOUNTER — Telehealth: Payer: Self-pay | Admitting: Gastroenterology

## 2018-04-25 ENCOUNTER — Ambulatory Visit: Payer: Medicare Other | Admitting: Gastroenterology

## 2018-04-25 NOTE — Telephone Encounter (Signed)
No charge. 

## 2018-04-26 DIAGNOSIS — M5030 Other cervical disc degeneration, unspecified cervical region: Secondary | ICD-10-CM | POA: Diagnosis not present

## 2018-04-26 DIAGNOSIS — M75101 Unspecified rotator cuff tear or rupture of right shoulder, not specified as traumatic: Secondary | ICD-10-CM | POA: Diagnosis not present

## 2018-05-05 ENCOUNTER — Other Ambulatory Visit: Payer: Self-pay | Admitting: Internal Medicine

## 2018-05-05 DIAGNOSIS — M48061 Spinal stenosis, lumbar region without neurogenic claudication: Secondary | ICD-10-CM

## 2018-05-08 ENCOUNTER — Telehealth: Payer: Self-pay | Admitting: Podiatry

## 2018-05-08 DIAGNOSIS — M898X9 Other specified disorders of bone, unspecified site: Secondary | ICD-10-CM | POA: Diagnosis not present

## 2018-05-08 NOTE — Telephone Encounter (Signed)
I'm a pt of Dr. Stephenie Acres and I have plantar fasciitis in my right foot. I need another foot brace. So if someone could call me please at [phone] to let me know when I can come pick that brace up, I would appreciate it. Thank you very much.

## 2018-05-08 NOTE — Telephone Encounter (Signed)
Left message informing pt, she could purchase another plantar fascial brace, we would file with her insurance, and what was not covered would be billed.

## 2018-05-12 DIAGNOSIS — M75101 Unspecified rotator cuff tear or rupture of right shoulder, not specified as traumatic: Secondary | ICD-10-CM | POA: Diagnosis not present

## 2018-05-12 DIAGNOSIS — M542 Cervicalgia: Secondary | ICD-10-CM | POA: Diagnosis not present

## 2018-05-16 ENCOUNTER — Encounter: Payer: Self-pay | Admitting: Pulmonary Disease

## 2018-05-16 NOTE — Telephone Encounter (Signed)
Error

## 2018-05-17 ENCOUNTER — Telehealth: Payer: Self-pay | Admitting: Pulmonary Disease

## 2018-05-17 NOTE — Telephone Encounter (Signed)
Called and spoke with patient, advised her that we have not received anything from [facility] on her. I looked in VS cubby as well as folder. Stated that we would keep a lookout for this and await for fax. Patient states that she will be calling [facility] to see if they can fax this again. Will keep encounter open until we hear back from patient or [facility].

## 2018-05-18 NOTE — Telephone Encounter (Signed)
We do not have any fax from [place] regarding Prior authorization for albuterol sulfate nebulizer solution. There is nothing for this pt in the PA folder; nothing in VS cubby or front office folder. Called [facility] [phone], was on hold over 65mins; will call back. Attempted to call patient today regarding the PA docs for neb solution. I did not receive an answer at time of call. I have left a voicemail message for pt to return call. X1

## 2018-05-18 NOTE — Telephone Encounter (Signed)
I have checked Dr. Juanetta Gosling look at and up front. I could not locate this fax.  Kelli - do you happen to have this?

## 2018-05-19 NOTE — Telephone Encounter (Signed)
Spoke with pt, she states she has received her nebulizer medications from [facility]. I am not sure what they needed but since she received her medication, I will close this encounter. Nothing further is needed.

## 2018-05-21 ENCOUNTER — Other Ambulatory Visit: Payer: Self-pay | Admitting: Gastroenterology

## 2018-06-01 ENCOUNTER — Other Ambulatory Visit: Payer: Self-pay | Admitting: Internal Medicine

## 2018-06-01 ENCOUNTER — Other Ambulatory Visit: Payer: Self-pay | Admitting: Gastroenterology

## 2018-06-02 ENCOUNTER — Other Ambulatory Visit: Payer: Self-pay | Admitting: Gastroenterology

## 2018-06-06 ENCOUNTER — Ambulatory Visit: Payer: Medicare Other | Admitting: Sports Medicine

## 2018-06-15 ENCOUNTER — Telehealth: Payer: Self-pay | Admitting: Gastroenterology

## 2018-06-15 ENCOUNTER — Ambulatory Visit: Payer: Medicare Other | Admitting: Gastroenterology

## 2018-06-15 NOTE — Telephone Encounter (Signed)
Do you want to charge? 

## 2018-06-15 NOTE — Telephone Encounter (Signed)
Yes charge 

## 2018-06-20 ENCOUNTER — Ambulatory Visit: Payer: Medicare Other | Admitting: Sports Medicine

## 2018-06-23 ENCOUNTER — Other Ambulatory Visit: Payer: Self-pay | Admitting: Gastroenterology

## 2018-06-26 ENCOUNTER — Ambulatory Visit (INDEPENDENT_AMBULATORY_CARE_PROVIDER_SITE_OTHER): Payer: Medicare Other | Admitting: Sports Medicine

## 2018-06-26 ENCOUNTER — Encounter: Payer: Self-pay | Admitting: Sports Medicine

## 2018-06-26 DIAGNOSIS — M722 Plantar fascial fibromatosis: Secondary | ICD-10-CM

## 2018-06-26 MED ORDER — TRIAMCINOLONE ACETONIDE 10 MG/ML IJ SUSP: 10.0000 mg | Freq: Once | INTRAMUSCULAR | Status: DC

## 2018-06-26 NOTE — Progress Notes (Signed)
Subjective: Cynthia Kirk is a 67 y.o. female patient presents to office with complaint of moderate heel pain on the right x1 year. Patient reports that she has treated this problem with brace, injection that only took the edge off with no relief.Pain 10/10 some days and others 8/10. Denies any other pedal complaints.   Patient Active Problem List   Diagnosis Date Noted  . Atrophic vaginitis 02/23/2018  . Candidiasis of skin 02/23/2018  . Increased frequency of urination 02/23/2018  . Postmenopausal bleeding 02/23/2018  . Osteopenia 02/23/2018  . Urinary incontinence 02/23/2018  . Vulvitis 02/23/2018  . Right shoulder pain 08/16/2017  . Vasomotor rhinitis 07/26/2017  . Anxiety 02/01/2017  . Viral upper respiratory tract infection 02/01/2017  . Renal cyst 07/23/2016  . Situational depression 06/24/2016  . Cervical disc disorder with radiculopathy of cervical region 03/13/2016  . B12 deficiency   . Cataract 02/25/2014  . Glaucoma suspect 02/25/2014  . Lumbar spinal stenosis 03/07/2013  . Ejection fraction   . Syncope   . Severe obesity (BMI >= 40) ([place]) 09/14/2010  . Essential hypertension 09/14/2010  . ANEMIA-IRON DEFICIENCY 03/20/2010  . GERD 08/22/2009  . Pulmonary sarcoidosis ([place]) 06/30/2009  . Diabetes mellitus ([place]) 06/30/2009  . OSA on CPAP 06/30/2009  . Upper airway cough syndrome 06/30/2009  . Mild persistent asthma 06/30/2009    Current Outpatient Medications on File Prior to Visit  Medication Sig Dispense Refill  . albuterol (PROVENTIL HFA;VENTOLIN HFA) 108 (90 Base) MCG/ACT inhaler Inhale 2 puffs into the lungs every 6 (six) hours as needed for wheezing or shortness of breath. 3 Inhaler 3  . albuterol (PROVENTIL) (2.5 MG/3ML) 0.083% nebulizer solution Take 3 mLs (2.5 mg total) by nebulization every 6 (six) hours as needed for shortness of breath. DX: J45.998 1080 mL 3  . ALPRAZolam (XANAX) 1 MG tablet TAKE 1 TABLET TWICE A DAY  AS NEEDED 180 tablet 0  . BYSTOLIC 5  MG tablet TAKE 1 TABLET DAILY 90 tablet 3  . Coenzyme Q10 (COQ10) 100 MG CAPS Take 1 capsule by mouth daily.    . cyclobenzaprine (FLEXERIL) 10 MG tablet TAKE 1/2 TO 1 TABLET 3 TIMES DAILY AS NEEDED FOR MUSCLE SPASMS. 90 tablet 0  . DEXILANT 60 MG capsule TAKE 1 CAPSULE BY MOUTH DAILY 90 capsule 0  . DEXILANT 60 MG capsule TAKE 1 CAPSULE DAILY. NEEDSAPPOINTMENT FOR FURTHER    REFILLS 30 capsule 0  . fluticasone (FLOVENT HFA) 44 MCG/ACT inhaler Inhale 2 puffs into the lungs 2 (two) times daily. 3 Inhaler 3  . gabapentin (NEURONTIN) 100 MG capsule Take 1 capsule (100 mg total) by mouth at bedtime. 90 capsule 3  . hydroquinone 4 % cream Apply topically 2 (two) times daily. 28.35 g 2  . ipratropium (ATROVENT) 0.06 % nasal spray Place 2 sprays into the nose daily.    . Lactobacillus ([employer]S ACIDOPHILUS PROBIOTIC) TABS Take 1 tablet by mouth 2 (two) times daily.  0  . liraglutide (VICTOZA) 18 MG/3ML SOPN INJECT 1.8 MG SQ DAILY 9 pen 1  . montelukast (SINGULAIR) 10 MG tablet Take 1 tablet (10 mg total) by mouth at bedtime. 90 tablet 1  . Multiple Vitamins-Minerals (WOMENS 50+ MULTI VITAMIN/MIN) TABS Take 1 tablet by mouth daily.    [name] olmesartan-hydrochlorothiazide ([name] HCT) 40-25 MG tablet Take 1 tablet by mouth daily. Keep scheduled appt in Oct for future refills 90 tablet 0  . Probiotic Product (PROBIOTIC & ACIDOPHILUS EX ST) CAPS Take 1 capsule by mouth 2 (two)  times daily. 14 capsule 0  . ranitidine (ZANTAC) 300 MG tablet TAKE 1 TABLET TWICE A DAY 60 tablet 0  . ranitidine (ZANTAC) 300 MG tablet TAKE 1 TABLET BY MOUTH TWICE DAILY 180 tablet 0  . triamcinolone ointment (KENALOG) 0.5 % Apply 1 application topically 2 (two) times daily. 30 g 0   No current facility-administered medications on file prior to visit.     Allergies  Allergen Reactions  . Aspirin     REACTION: hives  . Ibuprofen     Upsets stomach  . Latex     REACTION: rash    Objective: Physical Exam General: The patient is  alert and oriented x3 in no acute distress.  Dermatology: Skin is warm, dry and supple bilateral lower extremities. Nails 1-10 are normal. There is no erythema, edema, no eccymosis, no open lesions present. Integument is otherwise unremarkable.  Vascular: Dorsalis Pedis pulse and Posterior Tibial pulse are 2/4 bilateral. Capillary fill time is immediate to all digits.  Neurological: Grossly intact to light touch with an achilles reflex of +2/5 and a  negative Tinel's sign bilateral.  Musculoskeletal: Tenderness to palpation at the medial calcaneal tubercale and through the insertion of the plantar fascia and arch on the right foot. No pain with compression of calcaneus bilateral. No pain with tuning fork to calcaneus bilateral. No pain with calf compression bilateral. There is decreased Ankle joint range of motion bilateral. All other joints range of motion within normal limits bilateral. Strength 5/5 in all groups bilateral.   Assessment and Plan: Problem List Items Addressed This Visit    None    Visit Diagnoses    Plantar fasciitis, right    -  Primary   Relevant Medications   triamcinolone acetonide (KENALOG) 10 MG/ML injection 10 mg     -Complete examination performed.  -Previous Xrays reviewed -Discussed with patient in detail the condition of continued plantar fasciitis, how this occurs and general treatment options. Explained both conservative and surgical treatments.  -After oral consent and aseptic prep, injected a mixture containing 1 ml of 2%  plain lidocaine, 1 ml 0.5% plain marcaine, 0.5 ml of kenalog 10 and 0.5 ml of dexamethasone phosphate into right heel. Post-injection care discussed with patient.  -Patient declined oral medications  -Recommended good supportive shoes and advised use of OTC insert. - Explained in detail the use of the night splint which was dispensed at today's visit. -Explained and dispensed to patient daily stretching exercises. -Recommend patient to  ice affected area 1-2x daily. -Patient to return to office in 4 weeks for follow up or sooner if problems or questions arise.  Landis Martins, DPM

## 2018-07-04 DIAGNOSIS — M542 Cervicalgia: Secondary | ICD-10-CM | POA: Diagnosis not present

## 2018-07-05 DIAGNOSIS — E119 Type 2 diabetes mellitus without complications: Secondary | ICD-10-CM | POA: Diagnosis not present

## 2018-07-05 DIAGNOSIS — H25013 Cortical age-related cataract, bilateral: Secondary | ICD-10-CM | POA: Diagnosis not present

## 2018-07-05 DIAGNOSIS — H2513 Age-related nuclear cataract, bilateral: Secondary | ICD-10-CM | POA: Diagnosis not present

## 2018-07-05 DIAGNOSIS — H5203 Hypermetropia, bilateral: Secondary | ICD-10-CM | POA: Diagnosis not present

## 2018-07-05 LAB — HM DIABETES EYE EXAM

## 2018-07-07 ENCOUNTER — Other Ambulatory Visit: Payer: Self-pay

## 2018-07-18 ENCOUNTER — Encounter: Payer: Medicare Other | Admitting: Internal Medicine

## 2018-07-21 ENCOUNTER — Other Ambulatory Visit: Payer: Self-pay | Admitting: Pulmonary Disease

## 2018-07-21 ENCOUNTER — Other Ambulatory Visit: Payer: Self-pay | Admitting: Internal Medicine

## 2018-08-01 ENCOUNTER — Ambulatory Visit: Payer: Medicare Other | Admitting: Sports Medicine

## 2018-08-02 DIAGNOSIS — D860 Sarcoidosis of lung: Secondary | ICD-10-CM | POA: Diagnosis not present

## 2018-08-02 DIAGNOSIS — R509 Fever, unspecified: Secondary | ICD-10-CM | POA: Diagnosis not present

## 2018-08-02 DIAGNOSIS — Z79890 Hormone replacement therapy (postmenopausal): Secondary | ICD-10-CM | POA: Diagnosis not present

## 2018-08-02 DIAGNOSIS — J168 Pneumonia due to other specified infectious organisms: Secondary | ICD-10-CM | POA: Diagnosis not present

## 2018-08-02 DIAGNOSIS — R918 Other nonspecific abnormal finding of lung field: Secondary | ICD-10-CM | POA: Diagnosis not present

## 2018-08-02 DIAGNOSIS — M542 Cervicalgia: Secondary | ICD-10-CM | POA: Diagnosis not present

## 2018-08-02 DIAGNOSIS — E119 Type 2 diabetes mellitus without complications: Secondary | ICD-10-CM | POA: Diagnosis not present

## 2018-08-02 DIAGNOSIS — I10 Essential (primary) hypertension: Secondary | ICD-10-CM | POA: Diagnosis not present

## 2018-08-02 DIAGNOSIS — Z794 Long term (current) use of insulin: Secondary | ICD-10-CM | POA: Diagnosis not present

## 2018-08-02 DIAGNOSIS — J189 Pneumonia, unspecified organism: Secondary | ICD-10-CM | POA: Diagnosis not present

## 2018-08-02 DIAGNOSIS — R51 Headache: Secondary | ICD-10-CM | POA: Diagnosis not present

## 2018-08-02 DIAGNOSIS — Z79899 Other long term (current) drug therapy: Secondary | ICD-10-CM | POA: Diagnosis not present

## 2018-08-02 DIAGNOSIS — Z91040 Latex allergy status: Secondary | ICD-10-CM | POA: Diagnosis not present

## 2018-08-02 DIAGNOSIS — Z886 Allergy status to analgesic agent status: Secondary | ICD-10-CM | POA: Diagnosis not present

## 2018-08-02 DIAGNOSIS — R197 Diarrhea, unspecified: Secondary | ICD-10-CM | POA: Diagnosis not present

## 2018-08-02 DIAGNOSIS — J45909 Unspecified asthma, uncomplicated: Secondary | ICD-10-CM | POA: Diagnosis not present

## 2018-08-02 NOTE — Progress Notes (Signed)
Subjective:    Patient ID: Cynthia Kirk, female    DOB: 1951/03/16, 68 y.o.   MRN: [id number]  HPI The patient is here for follow up from the emergency room-pneumonia.  Her symptoms started just over one week ago with dairrhea.  Last weekend she was achy all over.   Her neck was hurting and she started to have nausea.  She had headaches.  Yesterday morning she was still feeling bad.  She had a fever up to 102.4.  She went to [facility] ED - she thought she had a virus, possibly flu.  she went to the ED mostly because of her neck pain.. She had a fever at night only.  Her neck pain was the biggest concern for her.  She had left sided neck pain that raidated down her left arm. She also had left leg.     Reviewd ED note.  She went to [facility] emergency room yesterday, 08/01/2018 for headache, neck pain and fever of 102.4.  She was also experiencing nausea and diarrhea.  She also complained of left leg pain for 3 days without injury.    Chest x-ray in the emergency room revealed bilateral interstitial nodule changes similar to CT from January 2019 that could be complained by sarcoidosis.  Mild superimposed interstitial infiltrate was not excluded.  CT of the head showed no acute findings.  Blood work was unremarkable except for minimally low potassium at 3.4, minimal anemia, hemoglobin 11.5.  She received Tylenol, ibuprofen and Zofran.  She did feel better and wanted to leave so was discharged.  She was prescribed a zpak, which she has not picked up.  She did not feel she had pneumonia.    All of her symptoms besides the neck have improved.  She denies fever, cough, nausea, diarrhea, cold symptoms and headaches.  She still has the neck pain.   Neck pain;  She still has neck pain.  It is not as bad as it was.  The pain is on the left side and she has increased pain with movement.  No current radiation down left arm - it did before.  No N/T.  When it first started it felt stiff like a muscle but then it was  more intense and very painful.  She has seen ortho for left sided and later right sided neck pain.  She has had injections in the left side which helped, but they did not help on the right when she had them.  She is going to have an MRI of her neck and then will follow up with ortho in a week or so.  She has take flexeril that she has at home.    Medications and allergies reviewed with patient and updated if appropriate.  Patient Active Problem List   Diagnosis Date Noted  . Atrophic vaginitis 02/23/2018  . Candidiasis of skin 02/23/2018  . Increased frequency of urination 02/23/2018  . Postmenopausal bleeding 02/23/2018  . Osteopenia 02/23/2018  . Urinary incontinence 02/23/2018  . Vulvitis 02/23/2018  . Right shoulder pain 08/16/2017  . Vasomotor rhinitis 07/26/2017  . Anxiety 02/01/2017  . Renal cyst 07/23/2016  . Situational depression 06/24/2016  . Cervical disc disorder with radiculopathy of cervical region 03/13/2016  . B12 deficiency   . Cataract 02/25/2014  . Glaucoma suspect 02/25/2014  . Lumbar spinal stenosis 03/07/2013  . Ejection fraction   . Syncope   . Severe obesity (BMI >= 40) ([place]) 09/14/2010  . Essential hypertension 09/14/2010  .  ANEMIA-IRON DEFICIENCY 03/20/2010  . GERD 08/22/2009  . Pulmonary sarcoidosis ([place]) 06/30/2009  . Diabetes mellitus ([place]) 06/30/2009  . OSA on CPAP 06/30/2009  . Upper airway cough syndrome 06/30/2009  . Mild persistent asthma 06/30/2009    Current Outpatient Medications on File Prior to Visit  Medication Sig Dispense Refill  . albuterol (PROVENTIL) (2.5 MG/3ML) 0.083% nebulizer solution Take 3 mLs (2.5 mg total) by nebulization every 6 (six) hours as needed for shortness of breath. DX: J45.998 1080 mL 3  . ALPRAZolam (XANAX) 1 MG tablet TAKE 1 TABLET TWICE A DAY  AS NEEDED 180 tablet 0  . BYSTOLIC 5 MG tablet TAKE 1 TABLET DAILY 90 tablet 3  . Coenzyme Q10 (COQ10) 100 MG CAPS Take 1 capsule by mouth daily.    . cyclobenzaprine  (FLEXERIL) 10 MG tablet TAKE 1/2 TO 1 TABLET 3 TIMES DAILY AS NEEDED FOR MUSCLE SPASMS. 90 tablet 0  . DEXILANT 60 MG capsule TAKE 1 CAPSULE BY MOUTH DAILY 90 capsule 0  . fluticasone (FLOVENT HFA) 44 MCG/ACT inhaler Inhale 2 puffs into the lungs 2 (two) times daily. 3 Inhaler 3  . gabapentin (NEURONTIN) 100 MG capsule Take 1 capsule (100 mg total) by mouth at bedtime. 90 capsule 3  . hydroquinone 4 % cream Apply topically 2 (two) times daily. 28.35 g 2  . ipratropium (ATROVENT) 0.06 % nasal spray Place 2 sprays into the nose daily.    . Lactobacillus (CVS ACIDOPHILUS PROBIOTIC) TABS Take 1 tablet by mouth 2 (two) times daily.  0  . liraglutide (VICTOZA) 18 MG/3ML SOPN INJECT 1.8 MG SQ DAILY 9 pen 0  . montelukast (SINGULAIR) 10 MG tablet Take 1 tablet (10 mg total) by mouth at bedtime. 90 tablet 1  . Multiple Vitamins-Minerals (WOMENS 50+ MULTI VITAMIN/MIN) TABS Take 1 tablet by mouth daily.    [name] olmesartan-hydrochlorothiazide ([name] HCT) 40-25 MG tablet Take 1 tablet by mouth daily. Keep scheduled appt in Oct for future refills 90 tablet 0  . Probiotic Product (PROBIOTIC & ACIDOPHILUS EX ST) CAPS Take 1 capsule by mouth 2 (two) times daily. 14 capsule 0  . ranitidine (ZANTAC) 300 MG tablet TAKE 1 TABLET TWICE A DAY 60 tablet 0  . ranitidine (ZANTAC) 300 MG tablet TAKE 1 TABLET BY MOUTH TWICE DAILY 180 tablet 0  . triamcinolone ointment (KENALOG) 0.5 % Apply 1 application topically 2 (two) times daily. 30 g 0  . VENTOLIN HFA 108 (90 Base) MCG/ACT inhaler INHALE 2 PUFF  BY MOUTH  EVERY 6 HOUR as NEEDED FOR WHEEZING ,SHORTNESS OF BREATH 54 each 2   Current Facility-Administered Medications on File Prior to Visit  Medication Dose Route Frequency Provider Last Rate Last Dose  . triamcinolone acetonide (KENALOG) 10 MG/ML injection 10 mg  10 mg Other Once Landis Martins, DPM        Past Medical History:  Diagnosis Date  . Abnormal Pap smear 2008  . Adenomatous colon polyp   . Allergic  rhinitis, cause unspecified   . Anxiety   . ASCUS with positive high risk HPV   . Blood transfusion 1976   Due to ectopic pregnancy  . Chronic back pain   . Ejection fraction    EF 45-50%, echo, October 27, 2011  . Endometrial mass 2011  . Esophageal reflux   . Fibroid   . H/O Clostridium difficile infection 03/2010  . H/O menorrhagia 2011  . Herpes simplex type 2 infection complicating pregnancy   . Hiatal hernia   .  Iron deficiency anemia   . Morbid obesity ([place]) 2010  . Obstructive sleep apnea (adult) (pediatric)    cpap  . Panic attacks   . Pneumonia 2005; 2007; 2009; 2011  . Sarcoidosis 1996   @ [employer]  . Simple endometrial hyperplasia 2011  . Spinal stenosis, lumbar    s/p decompression laminectomy 09/2013  . Syncope    ?? Syncope ??  . Type 2 diabetes, diet controlled ([place])   . Unspecified asthma(493.90)   . Urinary incontinence, mixed 2011  . VIN II (vulvar intraepithelial neoplasia II)     Past Surgical History:  Procedure Laterality Date  . COLPOSCOPY VULVA W/ BIOPSY     "I've had maybe 3" (09/05/2013)  . DILATION AND CURETTAGE OF UTERUS  2007  . [place]; ~ 1978  . HYSTEROSCOPY DIAGNOSTIC  2011  . LUMBAR LAMINECTOMY/DECOMPRESSION MICRODISCECTOMY N/A 09/05/2013   Procedure: Thoracic eleven-twelve Thoracic Laminectomy;  Surgeon: Eustace Moore, MD;  Location: [place] NEURO ORS;  Service: Neurosurgery;  Laterality: N/A;  Thoracic eleven-twelve Thoracic Laminectomy  . MAXIMUM ACCESS (MAS)POSTERIOR LUMBAR INTERBODY FUSION (PLIF) 1 LEVEL N/A 09/05/2013   Procedure: Lumbar four-five Maximum Access Surgery  Posterior lumbar interbody fusion;  Surgeon: Eustace Moore, MD;  Location: [place] NEURO ORS;  Service: Neurosurgery;  Laterality: N/A;  Lumbar four-five Maximum Access Surgery  Posterior lumbar interbody fusion  . POSTERIOR LAMINECTOMY / DECOMPRESSION LUMBAR SPINE  09/05/2013  . POSTERIOR LUMBAR FUSION  09/05/2013  . THORACIC LAMINECTOMY  09/05/2013  .  URETHRAL SLING  2011  . VULVECTOMY  ~ 2007   "for cancerous cells" (09/05/2013)    Social History   Socioeconomic History  . Marital status: Married    Spouse name: Not on file  . Number of children: 2  . Years of education: PHD  . Highest education level: Not on file  Occupational History  . Occupation: retired    Fish farm manager: UNEMPLOYED  Social Needs  . Financial resource strain: Not on file  . Food insecurity:    Worry: Not on file    Inability: Not on file  . Transportation needs:    Medical: Not on file    Non-medical: Not on file  Tobacco Use  . Smoking status: Never Smoker  . Smokeless tobacco: Never Used  Substance and Sexual Activity  . Alcohol use: Yes    Comment: 2018 - very occasionally  . Drug use: No  . Sexual activity: Yes    Birth control/protection: Post-menopausal  Lifestyle  . Physical activity:    Days per week: Not on file    Minutes per session: Not on file  . Stress: Not on file  Relationships  . Social connections:    Talks on phone: Not on file    Gets together: Not on file    Attends religious service: Not on file    Active member of club or organization: Not on file    Attends meetings of clubs or organizations: Not on file    Relationship status: Not on file  Other Topics Concern  . Not on file  Social History Narrative   Pt lives at home with her spouse.   She does not use caffeine.    Family History  Problem Relation Age of Onset  . Heart attack Father   . Hypertension Father   . Diabetes Father   . Seizures Father   . Migraines Father   . Kidney disease Sister   . Osteoporosis Sister   .  Heart attack Sister   . Cancer Paternal Uncle   . Asthma Daughter   . Asthma Son   . Osteoporosis Sister   . Lung cancer Maternal Uncle   . Kidney disease Other   . Diabetes Other   . High Cholesterol Brother        x2    Review of Systems  Constitutional: Negative for chills and fever (none in past 24 hrs).  HENT: Positive for  sore throat (resolved). Negative for congestion, ear pain and sinus pain.   Respiratory: Negative for cough, shortness of breath and wheezing.   Musculoskeletal: Positive for neck pain.  Neurological: Negative for headaches (resolved).       Objective:   Vitals:   08/03/18 0959  BP: 128/84  Pulse: 86  Resp: 16  Temp: 98 F (36.7 C)  SpO2: 98%   BP Readings from Last 3 Encounters:  08/03/18 128/84  03/29/18 110/80  03/16/18 124/74   Wt Readings from Last 3 Encounters:  08/03/18 245 lb (111.1 kg)  03/29/18 230 lb 6.4 oz (104.5 kg)  03/16/18 226 lb (102.5 kg)   Body mass index is 40.77 kg/m.   Physical Exam    Constitutional: Appears well-developed and well-nourished. No distress.  HENT:  Head: Normocephalic and atraumatic.  Neck: Neck supple. No tracheal deviation present. No thyromegaly present.  No cervical lymphadenopathy Cardiovascular: Normal rate, regular rhythm and normal heart sounds.   No murmur heard. No carotid bruit .  No edema Pulmonary/Chest: Effort normal and breath sounds normal. No respiratory distress. No has no wheezes. No rales.  Skin: Skin is warm and dry. Not diaphoretic.  Msk:  Left trapezius muscle spasms and tenderness with palpation, increased pain with movement.  , no right sided neck pain or c-spine tenderness.  No left  Arm pain.  Normal strength and sensation in b/l UE     Assessment & Plan:    See Problem List for Assessment and Plan of chronic medical problems.

## 2018-08-03 ENCOUNTER — Ambulatory Visit: Payer: Medicare Other | Admitting: Internal Medicine

## 2018-08-03 ENCOUNTER — Ambulatory Visit (INDEPENDENT_AMBULATORY_CARE_PROVIDER_SITE_OTHER): Payer: Medicare Other | Admitting: Internal Medicine

## 2018-08-03 ENCOUNTER — Encounter: Payer: Self-pay | Admitting: Internal Medicine

## 2018-08-03 VITALS — BP 128/84 | HR 86 | Temp 98.0°F | Resp 16 | Ht 65.0 in | Wt 245.0 lb

## 2018-08-03 DIAGNOSIS — M542 Cervicalgia: Secondary | ICD-10-CM | POA: Diagnosis not present

## 2018-08-03 DIAGNOSIS — B349 Viral infection, unspecified: Secondary | ICD-10-CM

## 2018-08-03 MED ORDER — METHYLPREDNISOLONE ACETATE 80 MG/ML IJ SUSP
80.0000 mg | Freq: Once | INTRAMUSCULAR | Status: AC
  Administered 2018-08-03: 80 mg via INTRAMUSCULAR

## 2018-08-03 NOTE — Assessment & Plan Note (Signed)
Trapezius muscle spasm Improved but still very painful  Continue flexeril, biofreeze Apply heat Depo-medrol 80 mg IM x 1 F/u with ortho

## 2018-08-03 NOTE — Patient Instructions (Addendum)
You received a steroid injection for your neck.  Keep taking the flexeril, biofreeze and apply heat.     You likely are getting over a viral illness.  You do not need to take the zpak.  Continue with symptomatic treatment.

## 2018-08-03 NOTE — Assessment & Plan Note (Signed)
Symptoms over the past week were likely viral No evidence of PNA and now feeling much better No need for the zpak Ct of chest only with chronic changes - no f/u needed Symptomatic treatment if needed

## 2018-08-10 ENCOUNTER — Other Ambulatory Visit: Payer: Self-pay | Admitting: Internal Medicine

## 2018-08-10 DIAGNOSIS — M13862 Other specified arthritis, left knee: Secondary | ICD-10-CM | POA: Diagnosis not present

## 2018-08-10 DIAGNOSIS — M13861 Other specified arthritis, right knee: Secondary | ICD-10-CM | POA: Diagnosis not present

## 2018-08-10 DIAGNOSIS — M542 Cervicalgia: Secondary | ICD-10-CM | POA: Diagnosis not present

## 2018-08-10 DIAGNOSIS — M48061 Spinal stenosis, lumbar region without neurogenic claudication: Secondary | ICD-10-CM

## 2018-08-21 ENCOUNTER — Other Ambulatory Visit: Payer: Self-pay | Admitting: Internal Medicine

## 2018-08-22 ENCOUNTER — Ambulatory Visit (INDEPENDENT_AMBULATORY_CARE_PROVIDER_SITE_OTHER): Payer: Medicare Other | Admitting: Sports Medicine

## 2018-08-22 ENCOUNTER — Encounter: Payer: Self-pay | Admitting: Sports Medicine

## 2018-08-22 DIAGNOSIS — M722 Plantar fascial fibromatosis: Secondary | ICD-10-CM

## 2018-08-22 DIAGNOSIS — L84 Corns and callosities: Secondary | ICD-10-CM

## 2018-08-22 DIAGNOSIS — M79671 Pain in right foot: Secondary | ICD-10-CM

## 2018-08-22 MED ORDER — TRIAMCINOLONE ACETONIDE 10 MG/ML IJ SUSP: 10.0000 mg | Freq: Once | INTRAMUSCULAR | Status: DC

## 2018-08-22 NOTE — Progress Notes (Signed)
Subjective: Cynthia Kirk is a 67 y.o. female returns to office for follow up evaluation after Right heel injection for plantar fasciitis, injection #1 administered 7 weeks ago. Patient states that the injection seems to help her pain initially for a little while but then the pain recurred states that the pain is about the same some days 10 out of 10 other days 8 out of 10 states on Sunday she had a very bad flareup when the pain came back especially after wearing shoes and doing a lot of walking and standing with to church services.  Patient states that she has been compliant using her night splint plantar fascial brace icing and gentle stretching and feels like sometimes the plantar fascial brace makes the pain worse.  Patient states that she would of taken her anti-inflammatory called Duexis however she ran out of this medication.  Patient reports that she also has had some irritation over her left foot callus after using a pumice stone.  Patient reports that her blood sugar this morning was 96.  Patient denies any new problems or issues/changes to health since last encounter.   Patient Active Problem List   Diagnosis Date Noted  . Viral illness 08/03/2018  . Neck pain on left side 08/03/2018  . Atrophic vaginitis 02/23/2018  . Candidiasis of skin 02/23/2018  . Increased frequency of urination 02/23/2018  . Postmenopausal bleeding 02/23/2018  . Osteopenia 02/23/2018  . Urinary incontinence 02/23/2018  . Vulvitis 02/23/2018  . Right shoulder pain 08/16/2017  . Vasomotor rhinitis 07/26/2017  . Anxiety 02/01/2017  . Renal cyst 07/23/2016  . Situational depression 06/24/2016  . Cervical disc disorder with radiculopathy of cervical region 03/13/2016  . B12 deficiency   . Cataract 02/25/2014  . Glaucoma suspect 02/25/2014  . Lumbar spinal stenosis 03/07/2013  . Ejection fraction   . Syncope   . Severe obesity (BMI >= 40) ([place]) 09/14/2010  . Essential hypertension 09/14/2010  . ANEMIA-IRON  DEFICIENCY 03/20/2010  . GERD 08/22/2009  . Pulmonary sarcoidosis ([place]) 06/30/2009  . Diabetes mellitus ([place]) 06/30/2009  . OSA on CPAP 06/30/2009  . Upper airway cough syndrome 06/30/2009  . Mild persistent asthma 06/30/2009    Current Outpatient Medications on File Prior to Visit  Medication Sig Dispense Refill  . albuterol (PROVENTIL) (2.5 MG/3ML) 0.083% nebulizer solution Take 3 mLs (2.5 mg total) by nebulization every 6 (six) hours as needed for shortness of breath. DX: J45.998 1080 mL 3  . ALPRAZolam (XANAX) 1 MG tablet TAKE 1 TABLET TWICE A DAY  AS NEEDED 180 tablet 0  . BYSTOLIC 5 MG tablet TAKE 1 TABLET DAILY 90 tablet 3  . Coenzyme Q10 (COQ10) 100 MG CAPS Take 1 capsule by mouth daily.    . cyclobenzaprine (FLEXERIL) 10 MG tablet TAKE 1/2 TO 1 TABLET 3 TIMES DAILY AS NEEDED FOR MUSCLE SPASMS. 90 tablet 0  . DEXILANT 60 MG capsule TAKE 1 CAPSULE BY MOUTH DAILY 90 capsule 0  . fluticasone (FLOVENT HFA) 44 MCG/ACT inhaler Inhale 2 puffs into the lungs 2 (two) times daily. 3 Inhaler 3  . gabapentin (NEURONTIN) 100 MG capsule Take 1 capsule (100 mg total) by mouth at bedtime. 90 capsule 3  . hydroquinone 4 % cream APPLY TO AFFECTED AREA TWICE A DAY 28.35 g 3  . ipratropium (ATROVENT) 0.06 % nasal spray Place 2 sprays into the nose daily.    . Lactobacillus (CVS ACIDOPHILUS PROBIOTIC) TABS Take 1 tablet by mouth 2 (two) times daily.  0  .  liraglutide (VICTOZA) 18 MG/3ML SOPN INJECT 1.8 MG SQ DAILY 9 pen 0  . montelukast (SINGULAIR) 10 MG tablet Take 1 tablet (10 mg total) by mouth at bedtime. 90 tablet 1  . Multiple Vitamins-Minerals (WOMENS 50+ MULTI VITAMIN/MIN) TABS Take 1 tablet by mouth daily.    [name] olmesartan-hydrochlorothiazide ([name] HCT) 40-25 MG tablet Take 1 tablet by mouth daily. Keep scheduled appt in Oct for future refills 90 tablet 0  . Probiotic Product (PROBIOTIC & ACIDOPHILUS EX ST) CAPS Take 1 capsule by mouth 2 (two) times daily. 14 capsule 0  . ranitidine (ZANTAC)  300 MG tablet TAKE 1 TABLET TWICE A DAY 60 tablet 0  . ranitidine (ZANTAC) 300 MG tablet TAKE 1 TABLET BY MOUTH TWICE DAILY 180 tablet 0  . triamcinolone ointment (KENALOG) 0.5 % Apply 1 application topically 2 (two) times daily. 30 g 0  . VENTOLIN HFA 108 (90 Base) MCG/ACT inhaler INHALE 2 PUFF  BY MOUTH  EVERY 6 HOUR as NEEDED FOR WHEEZING ,SHORTNESS OF BREATH 54 each 2   Current Facility-Administered Medications on File Prior to Visit  Medication Dose Route Frequency Provider Last Rate Last Dose  . triamcinolone acetonide (KENALOG) 10 MG/ML injection 10 mg  10 mg Other Once Landis Martins, DPM        Allergies  Allergen Reactions  . Aspirin     REACTION: hives  . Ibuprofen     Upsets stomach  . Latex     REACTION: rash    Objective:   General:  Alert and oriented x 3, in no acute distress  Dermatology: Skin is warm, dry, and supple bilateral. Nails are within normal limits.  Minimal keratosis plantar aspect of fifth metatarsal head on left foot.  There is no lower extremity erythema, no eccymosis, no open lesions present bilateral.   Vascular: Dorsalis Pedis and Posterior Tibial pedal pulses are 2/4 bilateral. + hair growth noted bilateral. Capillary Fill Time is 3 seconds in all digits. No varicosities, No edema bilateral lower extremities.   Neurological: Sensation grossly intact to light touch bilateral.  Musculoskeletal: There is tenderness to palpation at the medial calcaneal tubercale and through the insertion of the plantar fascia on the right foot. No pain with compression to calcaneus or application of tuning fork. There is decreased Ankle joint range of motion bilateral. All other jointsrange of motion  within normal limits bilateral.  Pes planus foot type bilateral.  Strength 5/5 bilateral.   Assessment and Plan: Problem List Items Addressed This Visit    None    Visit Diagnoses    Plantar fasciitis, right    -  Primary   Relevant Medications   triamcinolone  acetonide (KENALOG) 10 MG/ML injection 10 mg   Pain of right heel       Foot callus       Left      -Complete examination performed.  -Previous x-rays reviewed. -Discussed with patient in detail the condition of plantar fasciitis, how this  occurs related to the foot type of the patient and general treatment options. - Patient opted for another injection today; After oral consent and aseptic prep, injected a mixture containing 1 ml of 1%plain lidocaine, 1 ml 0.5% plain marcaine, 0.5 ml of kenalog 10 and 0.5 ml of dexmethasone phosphate to right heel at the glabrous junction medial aspect at area of most pain/trigger point injection.  This is injection #2 to the site. -Dispensed a replacement fascial brace since her current brace is very worn -Advised  patient we have to be careful with any oral anti-inflammatories since her history of stomach irritation from previous use of ibuprofen -Continue with stretching, icing, good supportive shoes, inserts daily.  -Discussed long term care and reocurrence; will closely monitor; if fails to improve will consider other treatment modalities.  -Patient to return to office in 1 month for follow up or sooner if problems or questions arise.  Advised patient to refrain from excessively smoothing the callus on the left foot and at this time recommend to apply padding or Band-Aid to help protect the area.  Landis Martins, DPM

## 2018-08-29 ENCOUNTER — Ambulatory Visit: Payer: Medicare Other

## 2018-08-29 ENCOUNTER — Encounter: Payer: Self-pay | Admitting: Gastroenterology

## 2018-08-29 ENCOUNTER — Ambulatory Visit (INDEPENDENT_AMBULATORY_CARE_PROVIDER_SITE_OTHER): Payer: Medicare Other | Admitting: Gastroenterology

## 2018-08-29 VITALS — BP 100/70 | HR 84 | Ht 63.25 in | Wt 241.1 lb

## 2018-08-29 DIAGNOSIS — K6289 Other specified diseases of anus and rectum: Secondary | ICD-10-CM | POA: Diagnosis not present

## 2018-08-29 DIAGNOSIS — K219 Gastro-esophageal reflux disease without esophagitis: Secondary | ICD-10-CM | POA: Diagnosis not present

## 2018-08-29 MED ORDER — RANITIDINE HCL 300 MG PO TABS: ORAL_TABLET | ORAL | 3 refills | Status: DC

## 2018-08-29 MED ORDER — DEXLANSOPRAZOLE 60 MG PO CPDR: 1.0000 | DELAYED_RELEASE_CAPSULE | Freq: Every day | ORAL | 3 refills | Status: DC

## 2018-08-29 NOTE — Progress Notes (Signed)
    History of Present Illness: This is a 67 year old female with GERD and intermittent rectal itching, rectal pain.  She relates that her reflux symptoms are under good control on her current regimen.  She states for the past several months she experiences rectal itching and rectal discomfort intermittently.  Although she no longer has her menstrual cycle she feels the symptoms occur around the time of her prior menstrual cycles.  She has used over-the-counter hemorrhoidal creams with improvement in symptoms however the itching has been somewhat refractory.  No change in bowel habits.  Colonoscopy in October 2012 showed mild left colon diverticulosis otherwise normal. Denies weight loss, abdominal pain, constipation, diarrhea, change in stool caliber, melena, hematochezia, nausea, vomiting, dysphagia, chest pain.  Current Medications, Allergies, Past Medical History, Past Surgical History, Family History and Social History were reviewed in [employer] record.  Physical Exam: General: Well developed, well nourished, obese, no acute distress Head: Normocephalic and atraumatic Eyes:  sclerae anicteric, EOMI Ears: Normal auditory acuity Mouth: No deformity or lesions Lungs: Clear throughout to auscultation Heart: Regular rate and rhythm; no murmurs, rubs or bruits Abdomen: Soft, non tender and non distended. No masses, hepatosplenomegaly or hernias noted. Normal Bowel sounds Rectal: no lesions, no tenderness, heme neg stool Musculoskeletal: Symmetrical with no gross deformities  Pulses:  Normal pulses noted Extremities: No clubbing, cyanosis, edema or deformities noted Neurological: Alert oriented x 4, grossly nonfocal Psychological:  Alert and cooperative. Normal mood and affect   Assessment and Recommendations:  1.  GERD.  Continue Dexilant 60 mg every morning and ranitidine 300 mg p.o. twice daily.  Closely follow all antireflux measures. REV in 1 year.   2. Rectal  itching, rectal pain.  Unremarkable DRE.  Suspected hemorrhoidal symptoms.  Begin standard rectal care instructions as outlined in patient instructions along with Preparation H cream twice daily as needed.  She is advised to call in 1 month if symptoms have not responded.

## 2018-08-29 NOTE — Patient Instructions (Addendum)
We have sent the following prescriptions to your mail in pharmacy: Dexilant and ranitidine.    If you have not heard from your mail in pharmacy within 1 week or if you have not received your medication in the mail, please contact us at [phone] so we may find out why.   RECTAL CARE INSTRUCTIONS:  1. Sitz Baths twice a day for 10 minutes each. 2. Thoroughly clean and dry the rectum. 3. Put Tucks pad against the rectum at night. 4. Clean the rectum with Balenol lotion after each bowel movement.  You can also use over the counter witch hazel for rectal issues.  Thank you for choosing me and [facility].  Pricilla Riffle. Dagoberto Ligas., MD., [facility] Reg[facility]

## 2018-08-30 ENCOUNTER — Telehealth: Payer: Self-pay | Admitting: *Deleted

## 2018-08-30 NOTE — Telephone Encounter (Signed)
"  I'm scheduled for surgery with Dr. Wynonia Musty on October 4.  If you could kindly please give me a call back.  I have a question I need for you to answer."  I'm returning your call.  How can I help you?  "Someone has already called me from the surgical center."

## 2018-09-01 ENCOUNTER — Other Ambulatory Visit: Payer: Self-pay | Admitting: Internal Medicine

## 2018-09-05 ENCOUNTER — Telehealth: Payer: Self-pay | Admitting: *Deleted

## 2018-09-05 ENCOUNTER — Ambulatory Visit (INDEPENDENT_AMBULATORY_CARE_PROVIDER_SITE_OTHER): Payer: Medicare Other | Admitting: Sports Medicine

## 2018-09-05 ENCOUNTER — Encounter: Payer: Self-pay | Admitting: Podiatry

## 2018-09-05 DIAGNOSIS — M898X9 Other specified disorders of bone, unspecified site: Secondary | ICD-10-CM | POA: Diagnosis not present

## 2018-09-05 DIAGNOSIS — L600 Ingrowing nail: Secondary | ICD-10-CM | POA: Diagnosis not present

## 2018-09-05 DIAGNOSIS — M722 Plantar fascial fibromatosis: Secondary | ICD-10-CM | POA: Diagnosis not present

## 2018-09-05 NOTE — Telephone Encounter (Signed)
"  Dr. Tyson Dense is my doctor.  I'm calling because I need to reschedule my surgery that's scheduled for next 03-14-2023.  We have a death in our family.  We'll probably be going up [place] in a few days.  If you could kindly please give me a call back, I'd appreciate it very much, so I can reschedule.  Thank you very much."

## 2018-09-05 NOTE — Telephone Encounter (Signed)
Cynthia Kirk came in for a consultation with Dr. Milinda Pointer.  She requested her surgery be rescheduled to [facility].  Dr. Milinda Pointer referred her to Dr. Cannon Kettle because he does not perform surgeries at [facility].  The surgery will be scheduled for September 25, 2018.

## 2018-09-05 NOTE — Progress Notes (Signed)
Subjective: Cynthia Kirk is a 67 y.o. female returns to office for follow up evaluation after Right heel injection for plantar fasciitis, injection #2 administered 3 weeks ago. Patient states that she is still having heel pain however has been scheduled for surgery with Dr. Milinda Pointer for her ingrown toenails however does report that due to her difficulty with anesthesia and difficulties with finding her veins needs to have the case done at the hospital thus Dr. Milinda Pointer discussed with me this patient and referred her back to me for continued care at the hospital.  Patient reports that she is here to discuss surgery plan and consent states that she is interested in having procedure done at hospital.  Patient Active Problem List   Diagnosis Date Noted  . Viral illness 08/03/2018  . Neck pain on left side 08/03/2018  . Atrophic vaginitis 02/23/2018  . Candidiasis of skin 02/23/2018  . Increased frequency of urination 02/23/2018  . Postmenopausal bleeding 02/23/2018  . Osteopenia 02/23/2018  . Urinary incontinence 02/23/2018  . Vulvitis 02/23/2018  . Right shoulder pain 08/16/2017  . Vasomotor rhinitis 07/26/2017  . Anxiety 02/01/2017  . Renal cyst 07/23/2016  . Situational depression 06/24/2016  . Cervical disc disorder with radiculopathy of cervical region 03/13/2016  . B12 deficiency   . Cataract 02/25/2014  . Glaucoma suspect 02/25/2014  . Lumbar spinal stenosis 03/07/2013  . Ejection fraction   . Syncope   . Severe obesity (BMI >= 40) ([place]) 09/14/2010  . Essential hypertension 09/14/2010  . ANEMIA-IRON DEFICIENCY 03/20/2010  . GERD 08/22/2009  . Pulmonary sarcoidosis ([place]) 06/30/2009  . Diabetes mellitus ([place]) 06/30/2009  . OSA on CPAP 06/30/2009  . Upper airway cough syndrome 06/30/2009  . Mild persistent asthma 06/30/2009    Current Outpatient Medications on File Prior to Visit  Medication Sig Dispense Refill  . albuterol (PROVENTIL) (2.5 MG/3ML) 0.083% nebulizer solution Take 3  mLs (2.5 mg total) by nebulization every 6 (six) hours as needed for shortness of breath. DX: J45.998 1080 mL 3  . ALPRAZolam (XANAX) 1 MG tablet TAKE 1 TABLET TWICE A DAY  AS NEEDED 180 tablet 0  . BD PEN NEEDLE NANO U/F 32G X 4 MM MISC USE 3 TIMES DAILY AS DIRECTED 100 each 5  . BYSTOLIC 5 MG tablet TAKE 1 TABLET DAILY 90 tablet 3  . Coenzyme Q10 (COQ10) 100 MG CAPS Take 1 capsule by mouth daily.    . cyclobenzaprine (FLEXERIL) 10 MG tablet TAKE 1/2 TO 1 TABLET 3 TIMES DAILY AS NEEDED FOR MUSCLE SPASMS. 90 tablet 0  . dexlansoprazole (DEXILANT) 60 MG capsule Take 1 capsule (60 mg total) by mouth daily. 90 capsule 3  . fluticasone (FLOVENT HFA) 44 MCG/ACT inhaler Inhale 2 puffs into the lungs 2 (two) times daily. 3 Inhaler 3  . gabapentin (NEURONTIN) 100 MG capsule Take 1 capsule (100 mg total) by mouth at bedtime. 90 capsule 3  . hydroquinone 4 % cream APPLY TO AFFECTED AREA TWICE A DAY 28.35 g 3  . ipratropium (ATROVENT) 0.06 % nasal spray Place 2 sprays into the nose daily.    [name] liraglutide (VICTOZA) 18 MG/3ML SOPN INJECT 1.8 MG SQ DAILY 9 pen 0  . montelukast (SINGULAIR) 10 MG tablet Take 1 tablet (10 mg total) by mouth at bedtime. 90 tablet 1  . Multiple Vitamins-Minerals (WOMENS 50+ MULTI VITAMIN/MIN) TABS Take 1 tablet by mouth daily.    [name] olmesartan-hydrochlorothiazide ([name] HCT) 40-25 MG tablet Take 1 tablet by mouth daily. Keep scheduled appt  in Oct for future refills 90 tablet 0  . Probiotic Product (PROBIOTIC & ACIDOPHILUS EX ST) CAPS Take 1 capsule by mouth 2 (two) times daily. 14 capsule 0  . ranitidine (ZANTAC) 300 MG tablet TAKE 1 TABLET BY MOUTH TWICE DAILY 180 tablet 3  . triamcinolone ointment (KENALOG) 0.5 % Apply 1 application topically 2 (two) times daily. 30 g 0  . VENTOLIN HFA 108 (90 Base) MCG/ACT inhaler INHALE 2 PUFF  BY MOUTH  EVERY 6 HOUR as NEEDED FOR WHEEZING ,SHORTNESS OF BREATH 54 each 2   Current Facility-Administered Medications on File Prior to Visit   Medication Dose Route Frequency Provider Last Rate Last Dose  . triamcinolone acetonide (KENALOG) 10 MG/ML injection 10 mg  10 mg Other Once Landis Martins, DPM      . triamcinolone acetonide (KENALOG) 10 MG/ML injection 10 mg  10 mg Other Once Landis Martins, DPM        Allergies  Allergen Reactions  . Aspirin     REACTION: hives  . Ibuprofen     Upsets stomach  . Latex     REACTION: rash    Objective:   General:  Alert and oriented x 3, in no acute distress  Dermatology: Skin is warm, dry, and supple bilateral. Nails are within normal limits except there is incurvation noted at bilateral hallux consistent with chronic ingrown toenail and exostosis underneath the hallux nails bilateral as noted on previous x-ray with recommendations from previous exam by Dr. Milinda Pointer.  Minimal keratosis plantar aspect of fifth metatarsal head on left foot.  There is no lower extremity erythema, no eccymosis, no open lesions present bilateral.   Vascular: Dorsalis Pedis and Posterior Tibial pedal pulses are 2/4 bilateral. + hair growth noted bilateral. Capillary Fill Time is 3 seconds in all digits. No varicosities, No edema bilateral lower extremities.   Neurological: Sensation grossly intact to light touch bilateral.  Musculoskeletal: There is decreased tenderness to palpation at the medial calcaneal tubercale and through the insertion of the plantar fascia on the right foot. No pain with compression to calcaneus or application of tuning fork. There is decreased Ankle joint range of motion bilateral. All other jointsrange of motion  within normal limits bilateral.  Pes planus foot type bilateral.  Strength 5/5 bilateral.   Assessment and Plan: Problem List Items Addressed This Visit    None    Visit Diagnoses    Plantar fasciitis, right    -  Primary   Bony exostosis, hallux, bilateral        Ingrown toenail, hallux, bilateral           -Complete examination performed.  -Previous x-rays  reviewed. -Discussed with patient in detail the condition of plantar fasciitis and ingrown toenail with exostosis -Patient opt for surgical management. Consent obtained for right foot endoscopic plantar fasciotomy and bilateral hallux removal of ingrown toenails with use of phenol and removal of exostosis bilateral.  Pre and Post op course explained. Risks, benefits, alternatives explained. No guarantees given or implied. Surgical booking slip submitted and provided patient with Surgical packet and info for [facility]. -To dispense postop shoes to use at hospital -Patient to return to office after surgery  Landis Martins, DPM

## 2018-09-05 NOTE — Patient Instructions (Signed)
Pre-Operative Instructions  Congratulations, you have decided to take an important step towards improving your quality of life.  You can be assured that the doctors and staff at [facility] will be with you every step of the way.  Here are some important things you should know:  1. Plan to be at the surgery center/hospital at least 1 (one) hour prior to your scheduled time, unless otherwise directed by the surgical center/hospital staff.  You must have a responsible adult accompany you, remain during the surgery and drive you home.  Make sure you have directions to the surgical center/hospital to ensure you arrive on time. 2. If you are having surgery at [facility] or [facility] hospitals, you will need a copy of your medical history and physical form from your family physician within one month prior to the date of surgery. We will give you a form for your primary physician to complete.  3. We make every effort to accommodate the date you request for surgery.  However, there are times where surgery dates or times have to be moved.  We will contact you as soon as possible if a change in schedule is required.   4. No aspirin/ibuprofen for one week before surgery.  If you are on aspirin, any non-steroidal anti-inflammatory medications (Mobic, Aleve, Ibuprofen) should not be taken seven (7) days prior to your surgery.  You make take Tylenol for pain prior to surgery.  5. Medications - If you are taking daily heart and blood pressure medications, seizure, reflux, allergy, asthma, anxiety, pain or diabetes medications, make sure you notify the surgery center/hospital before the day of surgery so they can tell you which medications you should take or avoid the day of surgery. 6. No food or drink after midnight the night before surgery unless directed otherwise by surgical center/hospital staff. 7. No alcoholic beverages 24-hours prior to surgery.  No smoking 24-hours prior or 24-hours after  surgery. 8. Wear loose pants or shorts. They should be loose enough to fit over bandages, boots, and casts. 9. Don't wear slip-on shoes. Sneakers are preferred. 10. Bring your boot with you to the surgery center/hospital.  Also bring crutches or a walker if your physician has prescribed it for you.  If you do not have this equipment, it will be provided for you after surgery. 11. If you have not been contacted by the surgery center/hospital by the day before your surgery, call to confirm the date and time of your surgery. 12. Leave-time from work may vary depending on the type of surgery you have.  Appropriate arrangements should be made prior to surgery with your employer. 13. Prescriptions will be provided immediately following surgery by your doctor.  Fill these as soon as possible after surgery and take the medication as directed. Pain medications will not be refilled on weekends and must be approved by the doctor. 14. Remove nail polish on the operative foot and avoid getting pedicures prior to surgery. 15. Wash the night before surgery.  The night before surgery wash the foot and leg well with water and the antibacterial soap provided. Be sure to pay special attention to beneath the toenails and in between the toes.  Wash for at least three (3) minutes. Rinse thoroughly with water and dry well with a towel.  Perform this wash unless told not to do so by your physician.  Enclosed: 1 Ice pack (please put in freezer the night before surgery)   1 Hibiclens skin cleaner     Pre-op instructions  If you have any questions regarding the instructions, please do not hesitate to call our office.  [place]: [address], [place], [place] [address] -- [phone]  [place]: [address] [place], [place] [address] -- [phone]  [place]: [address]  [place], [place] [address] -- [phone]  [place]: [address], [address], [place], [place] [address] -- [phone]  Website:  [email or web address] 

## 2018-09-06 NOTE — Telephone Encounter (Signed)
I will cancel her current postop appointments and get them rescheduled to reflect she will be having surgery by Dr. Cannon Kettle.

## 2018-09-11 ENCOUNTER — Other Ambulatory Visit: Payer: Self-pay | Admitting: Gastroenterology

## 2018-09-17 NOTE — Progress Notes (Signed)
Subjective:    Patient ID: Cynthia Kirk, female    DOB: Jan 12, 1951, 67 y.o.   MRN: [id number]  HPI The patient is here for follow up.  Cold symptoms;  Her symptoms started one week ago.  She is coughing a lot.  The cough is dry.  She is shortness of breath sometimes.  She has had fevers at home and her maximum temperature was 101.4.  The fevers are associated with chills.  She has very mild nasal congestion, runny nose and sore throat.  She has diffuse body aches, headaches and lightheadedness.She is taking mucinex and robitussin.  She also tried coricidan.    Hypertension: She is taking her medication daily. She is compliant with a low sodium diet.  She denies chest pain, palpitations, shortness of breath and regular headaches. She is exercising regularly - going to the gym.    Diabetes: She is taking her medication daily as prescribed. She is compliant with a diabetic diet.  She has had several steroid injections since she was here in her knees and foot and she is concerned that has elevated her sugars.  She is exercising regularly.  She checks her feet daily and denies foot lesions. She is up-to-date with an ophthalmology examination.   GERD:  She is taking her medication daily as prescribed.  She denies any GERD symptoms and feels her GERD is well controlled.   Anxiety: She is taking her xanax at night on occasion. She denies any side effects from the medication. She feels her anxiety is well controlled and she is happy with her current dose of medication.   Right foot plantar fasciitis:  She has had three steroid injections in the foot.  She will be having surgery next week for ingrown toe nails and bony growths.  She is also going to have plantar fascia surgery - they will cut the tendon.   Medications and allergies reviewed with patient and updated if appropriate.  Patient Active Problem List   Diagnosis Date Noted  . Neck pain on left side 08/03/2018  . Atrophic vaginitis  02/23/2018  . Increased frequency of urination 02/23/2018  . Postmenopausal bleeding 02/23/2018  . Osteopenia 02/23/2018  . Urinary incontinence 02/23/2018  . Right shoulder pain 08/16/2017  . Vasomotor rhinitis 07/26/2017  . Anxiety 02/01/2017  . Renal cyst 07/23/2016  . Situational depression 06/24/2016  . Cervical disc disorder with radiculopathy of cervical region 03/13/2016  . B12 deficiency   . Cataract 02/25/2014  . Glaucoma suspect 02/25/2014  . Lumbar spinal stenosis 03/07/2013  . Ejection fraction   . Syncope   . Severe obesity (BMI >= 40) ([place]) 09/14/2010  . Essential hypertension 09/14/2010  . ANEMIA-IRON DEFICIENCY 03/20/2010  . GERD 08/22/2009  . Pulmonary sarcoidosis ([place]) 06/30/2009  . Diabetes mellitus ([place]) 06/30/2009  . OSA on CPAP 06/30/2009  . Upper airway cough syndrome 06/30/2009  . Mild persistent asthma 06/30/2009    Current Outpatient Medications on File Prior to Visit  Medication Sig Dispense Refill  . albuterol (PROVENTIL) (2.5 MG/3ML) 0.083% nebulizer solution Take 3 mLs (2.5 mg total) by nebulization every 6 (six) hours as needed for shortness of breath. DX: J45.998 (Patient taking differently: Take 2.5-5 mg by nebulization every 6 (six) hours as needed for shortness of breath. DX: J45.998) 1080 mL 3  . BD PEN NEEDLE NANO U/F 32G X 4 MM MISC USE 3 TIMES DAILY AS DIRECTED 100 each 5  . BYSTOLIC 5 MG tablet TAKE 1 TABLET DAILY (Patient  taking differently: Take 5 mg by mouth at bedtime. ) 90 tablet 3  . Coenzyme Q10 (COQ10) 100 MG CAPS Take 100 mg by mouth daily.     . cyclobenzaprine (FLEXERIL) 10 MG tablet TAKE 1/2 TO 1 TABLET 3 TIMES DAILY AS NEEDED FOR MUSCLE SPASMS. (Patient taking differently: Take 5-10 mg by mouth 3 (three) times daily as needed for muscle spasms. ) 90 tablet 0  . dexlansoprazole (DEXILANT) 60 MG capsule Take 1 capsule (60 mg total) by mouth daily. 90 capsule 3  . fluticasone (FLOVENT HFA) 44 MCG/ACT inhaler Inhale 2 puffs into  the lungs 2 (two) times daily. (Patient taking differently: Inhale 2 puffs into the lungs 2 (two) times daily as needed (for shortness of breath or wheezing). ) 3 Inhaler 3  . gabapentin (NEURONTIN) 100 MG capsule Take 1 capsule (100 mg total) by mouth at bedtime. 90 capsule 3  . hydroquinone 4 % cream APPLY TO AFFECTED AREA TWICE A DAY (Patient taking differently: Apply 1 application topically 2 (two) times daily. ) 28.35 g 3  . ibuprofen (ADVIL,MOTRIN) 800 MG tablet Take 800 mg by mouth 3 (three) times daily as needed for moderate pain or headache.  3  . ipratropium (ATROVENT) 0.06 % nasal spray Place 2 sprays into both nostrils 2 (two) times daily.     [name] liraglutide (VICTOZA) 18 MG/3ML SOPN INJECT 1.8 MG SQ DAILY (Patient taking differently: Inject 1.8 mg into the skin daily. ) 9 pen 0  . montelukast (SINGULAIR) 10 MG tablet Take 1 tablet (10 mg total) by mouth at bedtime. 90 tablet 1  . Multiple Vitamins-Minerals (WOMENS 50+ MULTI VITAMIN/MIN) TABS Take 1 tablet by mouth daily.    [name] olmesartan-hydrochlorothiazide ([name] HCT) 40-25 MG tablet Take 1 tablet by mouth daily. Keep scheduled appt in Oct for future refills 90 tablet 0  . Probiotic Product (PROBIOTIC & ACIDOPHILUS EX ST) CAPS Take 1 capsule by mouth 2 (two) times daily. (Patient taking differently: Take 1 capsule by mouth daily. ) 14 capsule 0  . ranitidine (ZANTAC) 300 MG tablet TAKE 1 TABLET BY MOUTH TWICE DAILY (Patient taking differently: Take 300 mg by mouth 2 (two) times daily. ) 180 tablet 3  . triamcinolone ointment (KENALOG) 0.5 % Apply 1 application topically 2 (two) times daily. (Patient taking differently: Apply 1 application topically 2 (two) times daily as needed (for eczema). ) 30 g 0  . VENTOLIN HFA 108 (90 Base) MCG/ACT inhaler INHALE 2 PUFF  BY MOUTH  EVERY 6 HOUR as NEEDED FOR WHEEZING ,SHORTNESS OF BREATH (Patient taking differently: Inhale 2 puffs into the lungs every 6 (six) hours as needed for wheezing or shortness of  breath. ) 54 each 2   Current Facility-Administered Medications on File Prior to Visit  Medication Dose Route Frequency Provider Last Rate Last Dose  . triamcinolone acetonide (KENALOG) 10 MG/ML injection 10 mg  10 mg Other Once Stuckey, [place], DPM      . triamcinolone acetonide (KENALOG) 10 MG/ML injection 10 mg  10 mg Other Once Landis Martins, DPM        Past Medical History:  Diagnosis Date  . Abnormal Pap smear 2008  . Adenomatous colon polyp   . Allergic rhinitis, cause unspecified   . Anxiety   . ASCUS with positive high risk HPV   . Blood transfusion 1976   Due to ectopic pregnancy  . Chronic back pain   . Ejection fraction    EF 45-50%, echo, October 27, 2011  .  Endometrial mass 2011  . Esophageal reflux   . Fibroid   . H/O Clostridium difficile infection 03/2010  . H/O menorrhagia 2011  . Herpes simplex type 2 infection complicating pregnancy   . Hiatal hernia   . Iron deficiency anemia   . Morbid obesity ([place]) 2010  . Obstructive sleep apnea (adult) (pediatric)    cpap  . Panic attacks   . Plantar fasciitis   . Pneumonia 2005; 2007; 2009; 2011  . Sarcoidosis 1996   @ [employer]  . Simple endometrial hyperplasia 2011  . Spinal stenosis, lumbar    s/p decompression laminectomy 09/2013  . Syncope    ?? Syncope ??  . Type 2 diabetes, diet controlled ([place])   . Unspecified asthma(493.90)   . Urinary incontinence, mixed 2011  . VIN II (vulvar intraepithelial neoplasia II)     Past Surgical History:  Procedure Laterality Date  . COLPOSCOPY VULVA W/ BIOPSY     "I've had maybe 3" (09/05/2013)  . DILATION AND CURETTAGE OF UTERUS  2007  . [place]; ~ 1978  . HYSTEROSCOPY DIAGNOSTIC  2011  . LUMBAR LAMINECTOMY/DECOMPRESSION MICRODISCECTOMY N/A 09/05/2013   Procedure: Thoracic eleven-twelve Thoracic Laminectomy;  Surgeon: Eustace Moore, MD;  Location: [facility];  Service: Neurosurgery;  Laterality: N/A;  Thoracic eleven-twelve Thoracic  Laminectomy  . MAXIMUM ACCESS (MAS)POSTERIOR LUMBAR INTERBODY FUSION (PLIF) 1 LEVEL N/A 09/05/2013   Procedure: Lumbar four-five Maximum Access Surgery  Posterior lumbar interbody fusion;  Surgeon: Eustace Moore, MD;  Location: [facility];  Service: Neurosurgery;  Laterality: N/A;  Lumbar four-five Maximum Access Surgery  Posterior lumbar interbody fusion  . POSTERIOR LAMINECTOMY / DECOMPRESSION LUMBAR SPINE  09/05/2013  . POSTERIOR LUMBAR FUSION  09/05/2013  . THORACIC LAMINECTOMY  09/05/2013  . URETHRAL SLING  2011  . VULVECTOMY  ~ 2007   "for cancerous cells" (09/05/2013)    Social History   Socioeconomic History  . Marital status: Married    Spouse name: Not on file  . Number of children: 2  . Years of education: PHD  . Highest education level: Not on file  Occupational History  . Occupation: retired    Fish farm manager: UNEMPLOYED  Social Needs  . Financial resource strain: Not hard at all  . Food insecurity:    Worry: Never true    Inability: Never true  . Transportation needs:    Medical: No    Non-medical: No  Tobacco Use  . Smoking status: Never Smoker  . Smokeless tobacco: Never Used  Substance and Sexual Activity  . Alcohol use: Yes    Comment: 2018 - very occasionally  . Drug use: No  . Sexual activity: Yes    Birth control/protection: Post-menopausal  Lifestyle  . Physical activity:    Days per week: 0 days    Minutes per session: 0 min  . Stress: Not on file  Relationships  . Social connections:    Talks on phone: Not on file    Gets together: Not on file    Attends religious service: Not on file    Active member of club or organization: Not on file    Attends meetings of clubs or organizations: Not on file    Relationship status: Not on file  Other Topics Concern  . Not on file  Social History Narrative   Pt lives at home with her spouse.   She does not use caffeine.    Family History  Problem Relation Age of  Onset  . Heart attack Father   .  Hypertension Father   . Diabetes Father   . Seizures Father   . Migraines Father   . Kidney disease Sister   . Osteoporosis Sister   . Heart attack Sister   . Cancer Paternal Uncle   . Asthma Daughter   . Asthma Son   . Osteoporosis Sister   . Lung cancer Maternal Uncle   . Kidney disease Other   . Diabetes Other   . High Cholesterol Brother        x2    Review of Systems  Constitutional: Positive for chills and fever (101.4 last night).  HENT: Positive for congestion (occasional), rhinorrhea and sore throat. Negative for ear pain, sinus pressure and sinus pain.   Respiratory: Positive for cough (dry) and shortness of breath (sometimes). Negative for wheezing.   Cardiovascular: Positive for leg swelling. Negative for chest pain and palpitations.  Gastrointestinal: Positive for constipation and diarrhea (bowels alternate). Negative for abdominal pain and nausea.  Musculoskeletal: Positive for myalgias.  Neurological: Positive for light-headedness and headaches.       Objective:   Vitals:   09/19/18 1106  BP: 118/68  Pulse: 90  Resp: 16  Temp: 98.3 F (36.8 C)  SpO2: 97%   BP Readings from Last 3 Encounters:  09/19/18 118/68  08/29/18 100/70  08/03/18 128/84   Wt Readings from Last 3 Encounters:  09/19/18 240 lb (108.9 kg)  08/29/18 241 lb 2 oz (109.4 kg)  08/03/18 245 lb (111.1 kg)   Body mass index is 42.18 kg/m.   Physical Exam    Constitutional: Mildly ill-appearing. No distress.  HENT:  Head: Normocephalic and atraumatic.  Neck: Normal bilateral ear canals and tympanic membranes.  No oropharynx erythema.  Neck supple. No tracheal deviation present. No thyromegaly present.  No cervical lymphadenopathy Cardiovascular: Normal rate, regular rhythm and normal heart sounds.   No murmur heard. No carotid bruit .  No edema Pulmonary/Chest: Effort normal and breath sounds normal. No respiratory distress. No has no wheezes. No rales.  Skin: Skin is warm and dry.  Not diaphoretic.  Psychiatric: Normal mood and affect. Behavior is normal.      Assessment & Plan:    See Problem List for Assessment and Plan of chronic medical problems.

## 2018-09-17 NOTE — Patient Instructions (Addendum)
Have blood work and a chest x-ray today.   Tests ordered today. Your results will be released to [facility] (or called to you) after review, usually within 72hours after test completion. If any changes need to be made, you will be notified at that same time.   Medications reviewed and updated.  Changes include :   Cough syrup as needed  Your prescription(s) have been submitted to your pharmacy. Please take as directed and contact our office if you believe you are having problem(s) with the medication(s).   Please followup in 6 months

## 2018-09-18 NOTE — Progress Notes (Addendum)
Subjective:   Cynthia Kirk is a 67 y.o. female who presents for Medicare Annual (Subsequent) preventive examination.  Review of Systems:  No ROS.  Medicare Wellness Visit. Additional risk factors are reflected in the social history.  Cardiac Risk Factors include: advanced age (>74men, >52 women);diabetes mellitus;dyslipidemia;hypertension;obesity (BMI >30kg/m2) Sleep patterns: feels rested on waking, gets up 1 times nightly to void and sleeps 8-9 hours nightly.    Home Safety/Smoke Alarms: Feels safe in home. Smoke alarms in place.  Living environment; residence and Firearm Safety: 1-story house/ trailer, no firearms. Lives with husband, no needs for DME, good support system Seat Belt Safety/Bike Helmet: Wears seat belt.    Objective:     Vitals: BP 118/68   Pulse 90   Resp 16   Ht 5\' 3"  (1.6 m)   Wt 240 lb (108.9 kg)   LMP 02/14/2014   SpO2 97%   BMI 42.51 kg/m   Body mass index is 42.51 kg/m.  Advanced Directives 09/19/2018 02/21/2018 08/05/2017 04/06/2017 03/30/2016 03/01/2016 08/11/2015  Does Patient Have a Medical Advance Directive? Yes Yes No Yes No No No  Type of Paramedic of [place];Living will [place];Living will - [place];Living will - - -  Does patient want to make changes to medical advance directive? - No - Patient declined - - - - -  Copy of [place] in Chart? No - copy requested No - copy requested - No - copy requested - - -  Would patient like information on creating a medical advance directive? - - - - No - patient declined information No - patient declined information No - patient declined information  Pre-existing out of facility DNR order (yellow form or pink MOST form) - - - - - - -    Tobacco Social History   Tobacco Use  Smoking Status Never Smoker  Smokeless Tobacco Never Used     Counseling given: Not Answered  Past Medical History:  Diagnosis Date  . Abnormal  Pap smear 2008  . Adenomatous colon polyp   . Allergic rhinitis, cause unspecified   . Anxiety   . ASCUS with positive high risk HPV   . Blood transfusion 1976   Due to ectopic pregnancy  . Chronic back pain   . Ejection fraction    EF 45-50%, echo, October 27, 2011  . Endometrial mass 2011  . Esophageal reflux   . Fibroid   . H/O Clostridium difficile infection 03/2010  . H/O menorrhagia 2011  . Herpes simplex type 2 infection complicating pregnancy   . Hiatal hernia   . Iron deficiency anemia   . Morbid obesity ([place]) 2010  . Obstructive sleep apnea (adult) (pediatric)    cpap  . Panic attacks   . Plantar fasciitis   . Pneumonia 2005; 2007; 2009; 2011  . Sarcoidosis 1996   @ [employer]  . Simple endometrial hyperplasia 2011  . Spinal stenosis, lumbar    s/p decompression laminectomy 09/2013  . Syncope    ?? Syncope ??  . Type 2 diabetes, diet controlled ([place])   . Unspecified asthma(493.90)   . Urinary incontinence, mixed 2011  . VIN II (vulvar intraepithelial neoplasia II)    Past Surgical History:  Procedure Laterality Date  . COLPOSCOPY VULVA W/ BIOPSY     "I've had maybe 3" (09/05/2013)  . DILATION AND CURETTAGE OF UTERUS  2007  . [place]; ~ 1978  . HYSTEROSCOPY  DIAGNOSTIC  2011  . LUMBAR LAMINECTOMY/DECOMPRESSION MICRODISCECTOMY N/A 09/05/2013   Procedure: Thoracic eleven-twelve Thoracic Laminectomy;  Surgeon: Eustace Moore, MD;  Location: [facility];  Service: Neurosurgery;  Laterality: N/A;  Thoracic eleven-twelve Thoracic Laminectomy  . MAXIMUM ACCESS (MAS)POSTERIOR LUMBAR INTERBODY FUSION (PLIF) 1 LEVEL N/A 09/05/2013   Procedure: Lumbar four-five Maximum Access Surgery  Posterior lumbar interbody fusion;  Surgeon: Eustace Moore, MD;  Location: [facility];  Service: Neurosurgery;  Laterality: N/A;  Lumbar four-five Maximum Access Surgery  Posterior lumbar interbody fusion  . POSTERIOR LAMINECTOMY / DECOMPRESSION LUMBAR SPINE  09/05/2013  .  POSTERIOR LUMBAR FUSION  09/05/2013  . THORACIC LAMINECTOMY  09/05/2013  . URETHRAL SLING  2011  . VULVECTOMY  ~ 2007   "for cancerous cells" (09/05/2013)   Family History  Problem Relation Age of Onset  . Heart attack Father   . Hypertension Father   . Diabetes Father   . Seizures Father   . Migraines Father   . Kidney disease Sister   . Osteoporosis Sister   . Heart attack Sister   . Cancer Paternal Uncle   . Asthma Daughter   . Asthma Son   . Osteoporosis Sister   . Lung cancer Maternal Uncle   . Kidney disease Other   . Diabetes Other   . High Cholesterol Brother        x2   Social History   Socioeconomic History  . Marital status: Married    Spouse name: Not on file  . Number of children: 2  . Years of education: PHD  . Highest education level: Not on file  Occupational History  . Occupation: retired    Fish farm manager: UNEMPLOYED  Social Needs  . Financial resource strain: Not hard at all  . Food insecurity:    Worry: Never true    Inability: Never true  . Transportation needs:    Medical: No    Non-medical: No  Tobacco Use  . Smoking status: Never Smoker  . Smokeless tobacco: Never Used  Substance and Sexual Activity  . Alcohol use: Yes    Comment: 2018 - very occasionally  . Drug use: No  . Sexual activity: Yes    Birth control/protection: Post-menopausal  Lifestyle  . Physical activity:    Days per week: 4 days    Minutes per session: 50 min  . Stress: Not at all  Relationships  . Social connections:    Talks on phone: More than three times a week    Gets together: More than three times a week    Attends religious service: More than 4 times per year    Active member of club or organization: Yes    Attends meetings of clubs or organizations: More than 4 times per year    Relationship status: Married  Other Topics Concern  . Not on file  Social History Narrative   Pt lives at home with her spouse.   She does not use caffeine.    Outpatient  Encounter Medications as of 09/19/2018  Medication Sig  . albuterol (PROVENTIL) (2.5 MG/3ML) 0.083% nebulizer solution Take 3 mLs (2.5 mg total) by nebulization every 6 (six) hours as needed for shortness of breath. DX: J45.998 (Patient taking differently: Take 2.5-5 mg by nebulization every 6 (six) hours as needed for shortness of breath. DX: J45.998)  . BD PEN NEEDLE NANO U/F 32G X 4 MM MISC USE 3 TIMES DAILY AS DIRECTED  . BYSTOLIC 5 MG tablet TAKE  1 TABLET DAILY (Patient taking differently: Take 5 mg by mouth at bedtime. )  . Coenzyme Q10 (COQ10) 100 MG CAPS Take 100 mg by mouth daily.   . cyclobenzaprine (FLEXERIL) 10 MG tablet TAKE 1/2 TO 1 TABLET 3 TIMES DAILY AS NEEDED FOR MUSCLE SPASMS. (Patient taking differently: Take 5-10 mg by mouth 3 (three) times daily as needed for muscle spasms. )  . dexlansoprazole (DEXILANT) 60 MG capsule Take 1 capsule (60 mg total) by mouth daily.  . fluticasone (FLOVENT HFA) 44 MCG/ACT inhaler Inhale 2 puffs into the lungs 2 (two) times daily. (Patient taking differently: Inhale 2 puffs into the lungs 2 (two) times daily as needed (for shortness of breath or wheezing). )  . gabapentin (NEURONTIN) 100 MG capsule Take 1 capsule (100 mg total) by mouth at bedtime.  . hydroquinone 4 % cream APPLY TO AFFECTED AREA TWICE A DAY (Patient taking differently: Apply 1 application topically 2 (two) times daily. )  . ibuprofen (ADVIL,MOTRIN) 800 MG tablet Take 800 mg by mouth 3 (three) times daily as needed for moderate pain or headache.  . ipratropium (ATROVENT) 0.06 % nasal spray Place 2 sprays into both nostrils 2 (two) times daily.   [name] liraglutide (VICTOZA) 18 MG/3ML SOPN INJECT 1.8 MG SQ DAILY (Patient taking differently: Inject 1.8 mg into the skin daily. )  . montelukast (SINGULAIR) 10 MG tablet Take 1 tablet (10 mg total) by mouth at bedtime.  . Multiple Vitamins-Minerals (WOMENS 50+ MULTI VITAMIN/MIN) TABS Take 1 tablet by mouth daily.  [name]  olmesartan-hydrochlorothiazide ([name] HCT) 40-25 MG tablet Take 1 tablet by mouth daily. Keep scheduled appt in Oct for future refills  . Probiotic Product (PROBIOTIC & ACIDOPHILUS EX ST) CAPS Take 1 capsule by mouth 2 (two) times daily. (Patient taking differently: Take 1 capsule by mouth daily. )  . ranitidine (ZANTAC) 300 MG tablet TAKE 1 TABLET BY MOUTH TWICE DAILY (Patient taking differently: Take 300 mg by mouth 2 (two) times daily. )  . triamcinolone ointment (KENALOG) 0.5 % Apply 1 application topically 2 (two) times daily. (Patient taking differently: Apply 1 application topically 2 (two) times daily as needed (for eczema). )  . VENTOLIN HFA 108 (90 Base) MCG/ACT inhaler INHALE 2 PUFF  BY MOUTH  EVERY 6 HOUR as NEEDED FOR WHEEZING ,SHORTNESS OF BREATH (Patient taking differently: Inhale 2 puffs into the lungs every 6 (six) hours as needed for wheezing or shortness of breath. )  . [DISCONTINUED] ALPRAZolam (XANAX) 1 MG tablet TAKE 1 TABLET TWICE A DAY  AS NEEDED (Patient taking differently: Take 1 mg by mouth 2 (two) times daily as needed for anxiety. )   Facility-Administered Encounter Medications as of 09/19/2018  Medication  . triamcinolone acetonide (KENALOG) 10 MG/ML injection 10 mg  . triamcinolone acetonide (KENALOG) 10 MG/ML injection 10 mg    Activities of Daily Living In your present state of health, do you have any difficulty performing the following activities: 09/19/2018  Hearing? N  Vision? N  Difficulty concentrating or making decisions? N  Walking or climbing stairs? N  Dressing or bathing? N  Doing errands, shopping? N  Preparing Food and eating ? N  Using the Toilet? N  In the past six months, have you accidently leaked urine? N  Do you have problems with loss of bowel control? N  Managing your Medications? N  Managing your Finances? N  Housekeeping or managing your Housekeeping? N  Some recent data might be hidden    Patient Care  Team: [name], MD  as PCP - General (Internal Medicine) Suella Broad, MD as Consulting Physician (Physical Medicine and Rehabilitation) Chesley Mires, MD as Consulting Physician (Pulmonary Disease) Ladene Artist, MD as Consulting Physician (Gastroenterology) Melida Quitter, MD as Consulting Physician (Otolaryngology) Everett Graff, MD as Consulting Physician (Obstetrics and Gynecology) Eustace Moore, MD (Neurosurgery) Fay Records, MD as Consulting Physician (Cardiology)    Assessment:   This is a routine wellness examination for [place]. Physical assessment deferred to PCP.   Exercise Activities and Dietary recommendations Current Exercise Habits: Structured exercise class, Type of exercise: walking;strength training/weights, Time (Minutes): 50, Frequency (Times/Week): 4, Weekly Exercise (Minutes/Week): 200, Intensity: Mild, Exercise limited by: orthopedic condition(s)  Diet (meal preparation, eat out, water intake, caffeinated beverages, dairy products, fruits and vegetables): in general, a "healthy" diet  , on average, 1-2 meals per day   Reviewed heart healthy and diabetic diet. Discussed not skipping meals and supplementing with Glucerna, samples and coupons provided.  Goals    . Increase how much I am walking per week     Start to walk at the gym 3 times a week.    . Patient Stated     I want to travel abroad as much as possible.     . Weight < 180 lb (81.647 kg)     On diabetic is helping Belong to the weight watchers; online        Fall Risk Fall Risk  09/19/2018 07/07/2018 04/06/2017 03/30/2016 03/04/2014  Falls in the past year? Yes Yes No No No  Comment - Emmi Telephone Survey: data to providers prior to load - - -  Number falls in past yr: 1 1 - - -  Comment - Emmi Telephone Survey Actual Response = 1 - - -  Injury with Fall? No No - - -  Follow up Falls prevention discussed - - - -    Depression Screen PHQ 2/9 Scores 09/19/2018 04/06/2017 03/30/2016 03/04/2014  PHQ - 2 Score 0 0  0 0     Cognitive Function       Ad8 score reviewed for issues:  Issues making decisions: no  Less interest in hobbies / activities: no  Repeats questions, stories (family complaining): no  Trouble using ordinary gadgets (microwave, computer, phone):no  Forgets the month or year: no  Mismanaging finances: no  Remembering appts: no  Daily problems with thinking and/or memory: no Ad8 score is= 0  Immunization History  Administered Date(s) Administered  . Influenza Split 10/06/2011  . Influenza Whole 10/14/2009, 09/14/2010  . Influenza, High Dose Seasonal PF 08/19/2016, 08/16/2017  . Influenza, Seasonal, Injecte, Preservative Fre 01/29/2013  . Influenza,inj,Quad PF,6+ Mos 08/16/2013, 09/09/2014, 01/02/2016  . Pneumococcal Conjugate-13 11/15/2014  . Pneumococcal Polysaccharide-23 03/06/2008, 08/19/2016  . Td 12/06/2004  . Tdap 07/09/2015    Screening Tests Health Maintenance  Topic Date Due  . MAMMOGRAM  04/01/2016  . INFLUENZA VACCINE  04/05/2019 (Originally 07/06/2018)  . HEMOGLOBIN A1C  09/28/2018  . FOOT EXAM  02/24/2019  . OPHTHALMOLOGY EXAM  07/06/2019  . DEXA SCAN  08/31/2021  . COLONOSCOPY  09/29/2021  . TETANUS/TDAP  07/08/2025  . Hepatitis C Screening  Completed  . PNA vac Low Risk Adult  Completed      Plan:     Patient will call and schedule screening mammogram, education was provided.  Continue doing brain stimulating activities (puzzles, reading, adult coloring books, staying active) to keep memory sharp.   Continue to eat heart  healthy diet (full of fruits, vegetables, whole grains, lean protein, water--limit salt, fat, and sugar intake) and increase physical activity as tolerated.  I have personally reviewed and noted the following in the patient's chart:   . Medical and social history . Use of alcohol, tobacco or illicit drugs  . Current medications and supplements . Functional ability and status . Nutritional status . Physical  activity . Advanced directives . List of other physicians . Vitals . Screenings to include cognitive, depression, and falls . Referrals and appointments  In addition, I have reviewed and discussed with patient certain preventive protocols, quality metrics, and best practice recommendations. A written personalized care plan for preventive services as well as general preventive health recommendations were provided to patient.     Michiel Cowboy, RN  09/19/2018   Medical screening examination/treatment/procedure(s) were performed by non-physician practitioner and as supervising physician I was immediately available for consultation/collaboration. I agree with above. [name], MD

## 2018-09-19 ENCOUNTER — Encounter: Payer: Self-pay | Admitting: Internal Medicine

## 2018-09-19 ENCOUNTER — Telehealth: Payer: Self-pay | Admitting: *Deleted

## 2018-09-19 ENCOUNTER — Ambulatory Visit: Payer: Medicare Other

## 2018-09-19 ENCOUNTER — Ambulatory Visit (INDEPENDENT_AMBULATORY_CARE_PROVIDER_SITE_OTHER): Payer: Medicare Other | Admitting: *Deleted

## 2018-09-19 ENCOUNTER — Ambulatory Visit (INDEPENDENT_AMBULATORY_CARE_PROVIDER_SITE_OTHER): Payer: Medicare Other | Admitting: Internal Medicine

## 2018-09-19 ENCOUNTER — Ambulatory Visit: Payer: Medicare Other | Admitting: Sports Medicine

## 2018-09-19 ENCOUNTER — Ambulatory Visit (INDEPENDENT_AMBULATORY_CARE_PROVIDER_SITE_OTHER)
Admission: RE | Admit: 2018-09-19 | Discharge: 2018-09-19 | Disposition: A | Discharge status: Home / Self Care | Payer: Medicare Other | Source: Ambulatory Visit | Attending: Internal Medicine | Admitting: Internal Medicine

## 2018-09-19 ENCOUNTER — Other Ambulatory Visit (INDEPENDENT_AMBULATORY_CARE_PROVIDER_SITE_OTHER): Payer: Medicare Other

## 2018-09-19 VITALS — BP 118/68 | HR 90 | Resp 16 | Ht 63.0 in | Wt 240.0 lb

## 2018-09-19 VITALS — BP 118/68 | HR 90 | Temp 98.3°F | Resp 16 | Ht 63.25 in | Wt 240.0 lb

## 2018-09-19 DIAGNOSIS — E119 Type 2 diabetes mellitus without complications: Secondary | ICD-10-CM

## 2018-09-19 DIAGNOSIS — K219 Gastro-esophageal reflux disease without esophagitis: Secondary | ICD-10-CM | POA: Diagnosis not present

## 2018-09-19 DIAGNOSIS — R0602 Shortness of breath: Secondary | ICD-10-CM | POA: Diagnosis not present

## 2018-09-19 DIAGNOSIS — Z0000 Encounter for general adult medical examination without abnormal findings: Secondary | ICD-10-CM

## 2018-09-19 DIAGNOSIS — F419 Anxiety disorder, unspecified: Secondary | ICD-10-CM | POA: Diagnosis not present

## 2018-09-19 DIAGNOSIS — R059 Cough, unspecified: Secondary | ICD-10-CM

## 2018-09-19 DIAGNOSIS — I10 Essential (primary) hypertension: Secondary | ICD-10-CM

## 2018-09-19 DIAGNOSIS — R05 Cough: Secondary | ICD-10-CM

## 2018-09-19 LAB — LIPID PANEL
CHOLESTEROL: 171 mg/dL (ref 0–200)
HDL: 54.1 mg/dL (ref >39.00)
LDL CALC: 92 mg/dL (ref 0–99)
NonHDL: 116.6
TRIGLYCERIDES: 122 mg/dL (ref 0.0–149.0)
Total CHOL/HDL Ratio: 3
VLDL: 24.4 mg/dL (ref 0.0–40.0)

## 2018-09-19 LAB — CBC WITH DIFFERENTIAL/PLATELET
BASOS PCT: 0.4 % (ref 0.0–3.0)
Basophils Absolute: 0 10*3/uL (ref 0.0–0.1)
EOS ABS: 0.1 10*3/uL (ref 0.0–0.7)
Eosinophils Relative: 2.1 % (ref 0.0–5.0)
HCT: 39.7 % (ref 36.0–46.0)
Hemoglobin: 12.8 g/dL (ref 12.0–15.0)
LYMPHS PCT: 23 % (ref 12.0–46.0)
Lymphs Abs: 1.4 10*3/uL (ref 0.7–4.0)
MCHC: 32.2 g/dL (ref 30.0–36.0)
MCV: 90.2 fl (ref 78.0–100.0)
MONOS PCT: 7.6 % (ref 3.0–12.0)
Monocytes Absolute: 0.5 10*3/uL (ref 0.1–1.0)
NEUTROS ABS: 4.2 10*3/uL (ref 1.4–7.7)
Neutrophils Relative %: 66.9 % (ref 43.0–77.0)
PLATELETS: 240 10*3/uL (ref 150.0–400.0)
RBC: 4.41 Mil/uL (ref 3.87–5.11)
RDW: 14 % (ref 11.5–15.5)
WBC: 6.3 10*3/uL (ref 4.0–10.5)

## 2018-09-19 LAB — COMPREHENSIVE METABOLIC PANEL
ALT: 11 U/L (ref 0–35)
AST: 18 U/L (ref 0–37)
Albumin: 3.8 g/dL (ref 3.5–5.2)
Alkaline Phosphatase: 121 U/L — ABNORMAL HIGH (ref 39–117)
BUN: 23 mg/dL (ref 6–23)
CALCIUM: 9.5 mg/dL (ref 8.4–10.5)
CHLORIDE: 100 meq/L (ref 96–112)
CO2: 31 meq/L (ref 19–32)
Creatinine, Ser: 1.02 mg/dL (ref 0.40–1.20)
GFR: 69.39 mL/min (ref >60.00)
Glucose, Bld: 88 mg/dL (ref 70–99)
Potassium: 3.9 mEq/L (ref 3.5–5.1)
Sodium: 138 mEq/L (ref 135–145)
Total Bilirubin: 0.5 mg/dL (ref 0.2–1.2)
Total Protein: 7.2 g/dL (ref 6.0–8.3)

## 2018-09-19 LAB — HEMOGLOBIN A1C: Hgb A1c MFr Bld: 5.2 % (ref 4.6–6.5)

## 2018-09-19 MED ORDER — HYDROCOD POLST-CPM POLST ER 10-8 MG/5ML PO SUER: 5.0000 mL | Freq: Two times a day (BID) | ORAL | 0 refills | Status: DC | PRN

## 2018-09-19 MED ORDER — ALPRAZOLAM 0.5 MG PO TABS: 0.5000 mg | ORAL_TABLET | Freq: Every day | ORAL | 0 refills | Status: DC | PRN

## 2018-09-19 NOTE — Patient Instructions (Addendum)
Continue doing brain stimulating activities (puzzles, reading, adult coloring books, staying active) to keep memory sharp.   Continue to eat heart healthy diet (full of fruits, vegetables, whole grains, lean protein, water--limit salt, fat, and sugar intake) and increase physical activity as tolerated.   Cynthia Kirk , Thank you for taking time to come for your Medicare Wellness Visit. I appreciate your ongoing commitment to your health goals. Please review the following plan we discussed and let me know if I can assist you in the future.   These are the goals we discussed: Goals    . Increase how much I am walking per week     Start to walk at the gym 3 times a week.    . Patient Stated     I want to travel abroad as much as possible.     . Weight < 180 lb (81.647 kg)     On diabetic is helping Belong to the weight watchers; online        This is a list of the screening recommended for you and due dates:  Health Maintenance  Topic Date Due  . Mammogram  04/01/2016  . Flu Shot  04/05/2019*  . Hemoglobin A1C  09/28/2018  . Complete foot exam   02/24/2019  . Eye exam for diabetics  07/06/2019  . DEXA scan (bone density measurement)  08/31/2021  . Colon Cancer Screening  09/29/2021  . Tetanus Vaccine  07/08/2025  .  Hepatitis C: One time screening is recommended by Center for Disease Control  (CDC) for  adults born from [birth year] through 1965.   Completed  . Pneumonia vaccines  Completed  *Topic was postponed. The date shown is not the original due date.     [facility] [place] [facility] in [place], [place] in: [place] Address: [address], [place], [place] [address] Phone: [phone]  [facility] [place] [facility]ologist in [place], [place] Address: [address], [place], [place] [address] Phone: [phone]  Health Maintenance, Female Adopting a healthy lifestyle and getting preventive care can go a long  way to promote health and wellness. Talk with your health care provider about what schedule of regular examinations is right for you. This is a good chance for you to check in with your provider about disease prevention and staying healthy. In between checkups, there are plenty of things you can do on your own. Experts have done a lot of research about which lifestyle changes and preventive measures are most likely to keep you healthy. Ask your health care provider for more information. Weight and diet Eat a healthy diet  Be sure to include plenty of vegetables, fruits, low-fat dairy products, and lean protein.  Do not eat a lot of foods high in solid fats, added sugars, or salt.  Get regular exercise. This is one of the most important things you can do for your health. ? Most adults should exercise for at least 150 minutes each week. The exercise should increase your heart rate and make you sweat (moderate-intensity exercise). ? Most adults should also do strengthening exercises at least twice a week. This is in addition to the moderate-intensity exercise.  Maintain a healthy weight  Body mass index (BMI) is a measurement that can be used to identify possible weight problems. It estimates body fat based on height and weight. Your health care provider can help determine your BMI and help you achieve or maintain a healthy weight.  For females 25 years of age and older: ? A BMI below 18.5 is considered underweight. ? A BMI of 18.5 to 24.9 is normal. ? A BMI of 25 to 29.9 is considered overweight. ? A BMI of 30 and above is considered obese.  Watch levels of cholesterol and blood lipids  You should start having your blood tested for lipids and cholesterol at 67 years of age, then have this test every 5 years.  You may need to have your cholesterol levels checked more often if: ? Your lipid or cholesterol levels are high. ? You are older than 67 years of age. ? You are at high risk for  heart disease.  Cancer screening Lung Cancer  Lung cancer screening is recommended for adults 12-69 years old who are at high risk for lung cancer because of a history of smoking.  A yearly low-dose CT scan of the lungs is recommended for people who: ? Currently smoke. ? Have quit within the past 15 years. ? Have at least a 30-pack-year history of smoking. A pack year is smoking an average of one pack of cigarettes a day for 1 year.  Yearly screening should continue until it has been 15 years since you quit.  Yearly screening should stop if you develop a health problem that would prevent you from having lung cancer treatment.  Breast Cancer  Practice breast self-awareness. This means understanding how your breasts normally appear and feel.  It also means doing regular breast self-exams. Let your health care provider know about any changes, no matter how small.  If you are in your 20s or 30s, you should have a clinical breast exam (CBE) by a health care provider every 1-3 years as part of a regular health exam.  If you are 7 or older, have a CBE every year. Also consider having a breast X-ray (mammogram) every year.  If you have a family history of breast cancer, talk to your health care provider about genetic screening.  If you are at high risk for breast cancer, talk to your health care provider about having an MRI and a mammogram every year.  Breast cancer gene (BRCA) assessment is recommended for women who have family members with BRCA-related cancers. BRCA-related cancers include: ? Breast. ? Ovarian. ? Tubal. ? Peritoneal cancers.  Results of the assessment will determine the need for genetic counseling and BRCA1 and BRCA2 testing.  Cervical Cancer Your health care provider may recommend that you be screened regularly for cancer of the pelvic organs (ovaries, uterus, and vagina). This screening involves a pelvic examination, including checking for microscopic changes to  the surface of your cervix (Pap test). You may be encouraged to have this screening done every 3 years, beginning at age 59.  For women ages 22-65, health care providers may recommend pelvic exams and Pap testing every 3 years, or they may recommend the Pap and pelvic exam, combined with testing for human papilloma virus (HPV), every 5 years. Some types of HPV increase your risk of cervical cancer. Testing for HPV may also be done on women of any age with unclear Pap test results.  Other health care providers may not recommend any screening for nonpregnant women who are considered low risk for pelvic cancer and who do not have symptoms. Ask your health care provider if a screening pelvic exam is right for you.  If you have had past treatment for cervical cancer or a condition that could lead to cancer, you need Pap  tests and screening for cancer for at least 20 years after your treatment. If Pap tests have been discontinued, your risk factors (such as having a new sexual partner) need to be reassessed to determine if screening should resume. Some women have medical problems that increase the chance of getting cervical cancer. In these cases, your health care provider may recommend more frequent screening and Pap tests.  Colorectal Cancer  This type of cancer can be detected and often prevented.  Routine colorectal cancer screening usually begins at 67 years of age and continues through 67 years of age.  Your health care provider may recommend screening at an earlier age if you have risk factors for colon cancer.  Your health care provider may also recommend using home test kits to check for hidden blood in the stool.  A small camera at the end of a tube can be used to examine your colon directly (sigmoidoscopy or colonoscopy). This is done to check for the earliest forms of colorectal cancer.  Routine screening usually begins at age 73.  Direct examination of the colon should be repeated every  5-10 years through 67 years of age. However, you may need to be screened more often if early forms of precancerous polyps or small growths are found.  Skin Cancer  Check your skin from head to toe regularly.  Tell your health care provider about any new moles or changes in moles, especially if there is a change in a mole's shape or color.  Also tell your health care provider if you have a mole that is larger than the size of a pencil eraser.  Always use sunscreen. Apply sunscreen liberally and repeatedly throughout the day.  Protect yourself by wearing long sleeves, pants, a wide-brimmed hat, and sunglasses whenever you are outside.  Heart disease, diabetes, and high blood pressure  High blood pressure causes heart disease and increases the risk of stroke. High blood pressure is more likely to develop in: ? People who have blood pressure in the high end of the normal range (130-139/85-89 mm Hg). ? People who are overweight or obese. ? People who are African American.  If you are 68-37 years of age, have your blood pressure checked every 3-5 years. If you are 23 years of age or older, have your blood pressure checked every year. You should have your blood pressure measured twice-once when you are at a hospital or clinic, and once when you are not at a hospital or clinic. Record the average of the two measurements. To check your blood pressure when you are not at a hospital or clinic, you can use: ? An automated blood pressure machine at a pharmacy. ? A home blood pressure monitor.  If you are between 54 years and 62 years old, ask your health care provider if you should take aspirin to prevent strokes.  Have regular diabetes screenings. This involves taking a blood sample to check your fasting blood sugar level. ? If you are at a normal weight and have a low risk for diabetes, have this test once every three years after 67 years of age. ? If you are overweight and have a high risk for  diabetes, consider being tested at a younger age or more often. Preventing infection Hepatitis B  If you have a higher risk for hepatitis B, you should be screened for this virus. You are considered at high risk for hepatitis B if: ? You were born in a country where hepatitis B is  common. Ask your health care provider which countries are considered high risk. ? Your parents were born in a high-risk country, and you have not been immunized against hepatitis B (hepatitis B vaccine). ? You have HIV or AIDS. ? You use needles to inject street drugs. ? You live with someone who has hepatitis B. ? You have had sex with someone who has hepatitis B. ? You get hemodialysis treatment. ? You take certain medicines for conditions, including cancer, organ transplantation, and autoimmune conditions.  Hepatitis C  Blood testing is recommended for: ? Everyone born from [birth year] through 1965. ? Anyone with known risk factors for hepatitis C.  Sexually transmitted infections (STIs)  You should be screened for sexually transmitted infections (STIs) including gonorrhea and chlamydia if: ? You are sexually active and are younger than 67 years of age. ? You are older than 67 years of age and your health care provider tells you that you are at risk for this type of infection. ? Your sexual activity has changed since you were last screened and you are at an increased risk for chlamydia or gonorrhea. Ask your health care provider if you are at risk.  If you do not have HIV, but are at risk, it may be recommended that you take a prescription medicine daily to prevent HIV infection. This is called pre-exposure prophylaxis (PrEP). You are considered at risk if: ? You are sexually active and do not regularly use condoms or know the HIV status of your partner(s). ? You take drugs by injection. ? You are sexually active with a partner who has HIV.  Talk with your health care provider about whether you are at high risk  of being infected with HIV. If you choose to begin PrEP, you should first be tested for HIV. You should then be tested every 3 months for as long as you are taking PrEP. Pregnancy  If you are premenopausal and you may become pregnant, ask your health care provider about preconception counseling.  If you may become pregnant, take 400 to 800 micrograms (mcg) of folic acid every day.  If you want to prevent pregnancy, talk to your health care provider about birth control (contraception). Osteoporosis and menopause  Osteoporosis is a disease in which the bones lose minerals and strength with aging. This can result in serious bone fractures. Your risk for osteoporosis can be identified using a bone density scan.  If you are 49 years of age or older, or if you are at risk for osteoporosis and fractures, ask your health care provider if you should be screened.  Ask your health care provider whether you should take a calcium or vitamin D supplement to lower your risk for osteoporosis.  Menopause may have certain physical symptoms and risks.  Hormone replacement therapy may reduce some of these symptoms and risks. Talk to your health care provider about whether hormone replacement therapy is right for you. Follow these instructions at home:  Schedule regular health, dental, and eye exams.  Stay current with your immunizations.  Do not use any tobacco products including cigarettes, chewing tobacco, or electronic cigarettes.  If you are pregnant, do not drink alcohol.  If you are breastfeeding, limit how much and how often you drink alcohol.  Limit alcohol intake to no more than 1 drink per day for nonpregnant women. One drink equals 12 ounces of beer, 5 ounces of Cristofher Livecchi, or 1 ounces of hard liquor.  Do not use street drugs.  Do not  share needles.  Ask your health care provider for help if you need support or information about quitting drugs.  Tell your health care provider if you often  feel depressed.  Tell your health care provider if you have ever been abused or do not feel safe at home. This information is not intended to replace advice given to you by your health care provider. Make sure you discuss any questions you have with your health care provider. Document Released: 06/07/2011 Document Revised: 04/29/2016 Document Reviewed: 08/26/2015 Elsevier Interactive Patient Education  [employer].

## 2018-09-19 NOTE — Assessment & Plan Note (Addendum)
Sugars have been well controlled - in normal range On victoza - helps control weight  Will continue Check A1c

## 2018-09-19 NOTE — Pre-Procedure Instructions (Signed)
Cynthia Kirk  09/19/2018      [id number] [place] Kirk, [place] [address] Phone: [phone] Fax: [phone]  Cynthia Kirk, Cynthia Kirk - Cyn[place] Kirk [address] [place] Cynthia Kirk [address] Phone: [phone] Fax: [phone]    Your procedure is scheduled on October 21st.  Report to [facility] at 1230 P.M.  Call this number if you have problems the morning of surgery:  [phone]   Remember:  Do not eat or drink after midnight.    Take these medicines the morning of surgery with A SIP OF WATER   Xanax (if needed)  Flexeril (if needed)  Dexilant  Zantac  Ventolin Inhaler  7 days prior to surgery STOP taking any Aspirin(unless otherwise instructed by your surgeon), Aleve, Naproxen, Ibuprofen, Motrin, Advil, Goody's, BC's, all herbal medications, fish oil, and all vitamins     Do not wear jewelry, make-up or nail polish.  Do not wear lotions, powders, or perfumes, or deodorant.  Do not shave 48 hours prior to surgery.  Men may shave face and neck.  Do not bring valuables to the hospital.  [facility] is not responsible for any belongings or valuables.  Contacts, dentures or bridgework may not be worn into surgery.  Leave your suitcase in the car.  After surgery it may be brought to your room.  For patients admitted to the hospital, discharge time will be determined by your treatment team.  Patients discharged the day of surgery will not be allowed to drive home.    [place]- Preparing For Surgery  Before surgery, you can play an important role. Because skin is not sterile, your skin needs to be as free of germs as possible. You can reduce the number of germs on your skin by washing with CHG (chlorahexidine gluconate) Soap before surgery.  CHG is an antiseptic cleaner which kills germs and bonds with the skin to continue killing germs even after washing.    Oral Hygiene is also  important to reduce your risk of infection.  Remember - BRUSH YOUR TEETH THE MORNING OF SURGERY WITH YOUR REGULAR TOOTHPASTE  Please do not use if you have an allergy to CHG or antibacterial soaps. If your skin becomes reddened/irritated stop using the CHG.  Do not shave (including legs and underarms) for at least 48 hours prior to first CHG shower. It is OK to shave your face.  Please follow these instructions carefully.   1. Shower the NIGHT BEFORE SURGERY and the MORNING OF SURGERY with CHG.   2. If you chose to wash your hair, wash your hair first as usual with your normal shampoo.  3. After you shampoo, rinse your hair and body thoroughly to remove the shampoo.  4. Use CHG as you would any other liquid soap. You can apply CHG directly to the skin and wash gently with a scrungie or a clean washcloth.   5. Apply the CHG Soap to your body ONLY FROM THE NECK DOWN.  Do not use on open wounds or open sores. Avoid contact with your eyes, ears, mouth and genitals (private parts). Wash Face and genitals (private parts)  with your normal soap.  6. Wash thoroughly, paying special attention to the area where your surgery will be performed.  7. Thoroughly rinse your body with warm water from the neck down.  8. DO NOT shower/wash with your normal soap after  using and rinsing off the CHG Soap.  9. Pat yourself dry with a CLEAN TOWEL.  10. Wear CLEAN PAJAMAS to bed the night before surgery, wear comfortable clothes the morning of surgery  11. Place CLEAN SHEETS on your bed the night of your first shower and DO NOT SLEEP WITH PETS.    Day of Surgery:  Do not apply any deodorants/lotions.  Please wear clean clothes to the hospital/surgery center.   Remember to brush your teeth WITH YOUR REGULAR TOOTHPASTE.    Please read over the following fact sheets that you were given.

## 2018-09-19 NOTE — Assessment & Plan Note (Signed)
GERD controlled Continue daily medication  

## 2018-09-19 NOTE — Assessment & Plan Note (Addendum)
Taking Xanax 0.5 mg once daily as needed for anxiety-typically takes it at night Stressed not taking this on a regular basis due to risk of addiction and memory issues Okay to continue as needed only-refill  today

## 2018-09-19 NOTE — Assessment & Plan Note (Signed)
Dry cough with some other mild cold symptoms, has had a fever We will get chest x-ray to rule out pneumonia Treatment depending on chest x-ray Tussionex cough syrup

## 2018-09-19 NOTE — Telephone Encounter (Signed)
Patients postop appointments have been canceled. °

## 2018-09-19 NOTE — Telephone Encounter (Signed)
"  I've got some kind of bug going on.  I have had it since last week.  They do not know what it is.  They said it could possibly be my Sarcodosis or Bronchitis acting up.  The are running test.  She told me that she would not recommend me to have surgery next week.  She also said that she has not received anything from you all."  I will cancel the surgery and I will let Dr. Cannon Kettle know.  "Can you let pre-admit know too?"  They will be informed once I cancel the surgery.  Hope you feel better soon.

## 2018-09-19 NOTE — Assessment & Plan Note (Signed)
BP well controlled Current regimen effective and well tolerated Continue current medications at current doses cmp  

## 2018-09-20 ENCOUNTER — Encounter: Payer: Self-pay | Admitting: Internal Medicine

## 2018-09-20 ENCOUNTER — Inpatient Hospital Stay (HOSPITAL_COMMUNITY)
Admission: RE | Admit: 2018-09-20 | Discharge: 2018-09-20 | Disposition: A | Discharge status: Home / Self Care | Payer: Medicare Other | Source: Ambulatory Visit

## 2018-09-20 NOTE — Telephone Encounter (Signed)
error 

## 2018-09-21 ENCOUNTER — Other Ambulatory Visit: Payer: Self-pay | Admitting: Internal Medicine

## 2018-09-21 MED ORDER — HYDROCOD POLST-CPM POLST ER 10-8 MG/5ML PO SUER: 5.0000 mL | Freq: Two times a day (BID) | ORAL | 0 refills | Status: DC | PRN

## 2018-09-21 NOTE — Telephone Encounter (Signed)
Copied from [place] [id number]. Topic: Quick Communication - Rx Refill/Question >> Sep 21, 2018  8:53 AM Oliver Pila B wrote: Medication: chlorpheniramine-HYDROcodone Amanda Cockayne [facility]) 10-8 MG/5ML Latanya Presser [[id number]]   Pt called b/c the medication above [facility]; pt is needing to have the medication transferred to the [facility] @ [address] in [place]; contact pt or pharmacy if needed

## 2018-09-21 NOTE — Telephone Encounter (Signed)
Pt did call and check the price of [facility] and said it was much cheaper. Medication pended with the correct pharmacy listed.

## 2018-09-25 ENCOUNTER — Ambulatory Visit (HOSPITAL_COMMUNITY): Admission: RE | Admit: 2018-09-25 | Payer: Medicare Other | Source: Ambulatory Visit | Admitting: Sports Medicine

## 2018-09-25 ENCOUNTER — Encounter (HOSPITAL_COMMUNITY): Admission: RE | Payer: Self-pay | Source: Ambulatory Visit

## 2018-09-25 SURGERY — FASCIOTOMY, PLANTAR, ENDOSCOPIC
Anesthesia: General | Laterality: Right

## 2018-10-02 ENCOUNTER — Other Ambulatory Visit: Payer: Self-pay | Admitting: Internal Medicine

## 2018-10-03 ENCOUNTER — Encounter: Payer: Medicare Other | Admitting: Sports Medicine

## 2018-10-03 NOTE — Telephone Encounter (Signed)
Last refill was 02/07/18 Last OV was 09/19/18 Next OV is 03/22/19

## 2018-10-10 ENCOUNTER — Encounter: Payer: Medicare Other | Admitting: Sports Medicine

## 2018-10-25 ENCOUNTER — Ambulatory Visit: Payer: Medicare Other

## 2018-10-27 ENCOUNTER — Ambulatory Visit: Payer: Medicare Other

## 2018-11-16 ENCOUNTER — Telehealth: Payer: Self-pay | Admitting: Gastroenterology

## 2018-11-16 ENCOUNTER — Other Ambulatory Visit: Payer: Self-pay | Admitting: Internal Medicine

## 2018-11-16 MED ORDER — FAMOTIDINE 40 MG PO TABS: 40.0000 mg | ORAL_TABLET | Freq: Two times a day (BID) | ORAL | 11 refills | Status: DC

## 2018-11-16 NOTE — Telephone Encounter (Signed)
Patient concern about medication recall (zantac)

## 2018-11-16 NOTE — Telephone Encounter (Signed)
Informed patient that we are switching our patients to pepcid 40 mg to replace zantac 300 mg. Prescription sent to pharmacy and patient notified.

## 2018-11-30 ENCOUNTER — Other Ambulatory Visit: Payer: Self-pay | Admitting: Internal Medicine

## 2018-11-30 MED ORDER — MONTELUKAST SODIUM 10 MG PO TABS: 10.0000 mg | ORAL_TABLET | Freq: Every day | ORAL | 2 refills | Status: DC

## 2018-11-30 NOTE — Telephone Encounter (Signed)
Copied from [place] [id number]. Topic: Quick Communication - Rx Refill/Question >> Nov 30, 2018 11:09 AM Ahmed Prima L wrote: Medication: montelukast (SINGULAIR) 10 MG tablet  Has the patient contacted their pharmacy? [facility] (Agent: If no, request that the patient contact the pharmacy for the refill.) (Agent: If yes, when and what did the pharmacy advise?)  Preferred Pharmacy (with phone number or street name): [facility], if needed to call back @ [phone] reference number [id number]  Agent: Please be advised that RX refills may take up to 3 business days. We ask that you follow-up with your pharmacy.

## 2018-12-04 ENCOUNTER — Other Ambulatory Visit: Payer: Self-pay | Admitting: Internal Medicine

## 2018-12-04 DIAGNOSIS — M48061 Spinal stenosis, lumbar region without neurogenic claudication: Secondary | ICD-10-CM

## 2018-12-15 ENCOUNTER — Other Ambulatory Visit: Payer: Self-pay | Admitting: Internal Medicine

## 2018-12-15 MED ORDER — ALPRAZOLAM 0.5 MG PO TABS: 0.5000 mg | ORAL_TABLET | Freq: Every day | ORAL | 0 refills | Status: DC | PRN

## 2018-12-15 NOTE — Telephone Encounter (Signed)
Last OV 09/19/18  [place] Controlled Substance Database checked. Last filled on 02/27/18 #90  Last refill per chart 10/03/18 #90

## 2018-12-15 NOTE — Telephone Encounter (Signed)
Requested medication (s) are due for refill today: will be due 01/03/19  Requested medication (s) are on the active medication list: yes  Last refill:  10/03/18  Future visit scheduled: yes  Notes to clinic:  Med not delegated for NT to fill   Requested Prescriptions  Pending Prescriptions Disp Refills   ALPRAZolam (XANAX) 0.5 MG tablet 90 tablet 0    Sig: Take 1 tablet (0.5 mg total) by mouth daily as needed. for anxiety     Not Delegated - Psychiatry:  Anxiolytics/Hypnotics Failed - 12/15/2018  1:16 PM      Failed - This refill cannot be delegated      Failed - Urine Drug Screen completed in last 360 days.      Passed - Valid encounter within last 6 months    Recent Outpatient Visits          2 months ago Essential hypertension   [place], Claudina Lick, MD   4 months ago Viral illness   [place], Claudina Lick, MD   5 months ago    [place], MD   9 months ago Essential hypertension   [place], MD   9 months ago Essential hypertension   [place], MD      Future Appointments            In 3 months [place], Claudina Lick, MD [place], [facility]

## 2018-12-15 NOTE — Telephone Encounter (Signed)
Copied from [place] [id number]. Topic: Quick Communication - Rx Refill/Question >> Dec 15, 2018 12:34 PM Margot Ables wrote: Medication: ALPRAZolam Duanne Moron) 0.5 MG tablet - pt has 1 tablet left - pt takes 2/day - pt requests 90 day supply Pt states pharmacy has been requesting since Wednesday 12/13/2018. Pt has had 2 recent deaths in her family.  Has the patient contacted their pharmacy? yes Preferred Pharmacy (with phone number or street name[id number] [place], [place] [phone] (Phone) [phone] (Fax)

## 2018-12-22 ENCOUNTER — Other Ambulatory Visit: Payer: Self-pay

## 2018-12-22 MED ORDER — FAMOTIDINE 20 MG PO TABS: 40.0000 mg | ORAL_TABLET | Freq: Two times a day (BID) | ORAL | 1 refills | Status: DC

## 2018-12-23 IMAGING — MR MR SHOULDER*R* W/O CM
4 of 5 series · 19 of 40 positions shown · non-contrast
Comparison: None.

CLINICAL DATA: Right neck pain radiating to the right shoulder and
arm for 1 month.

EXAM:
MRI OF THE RIGHT SHOULDER WITHOUT CONTRAST
TECHNIQUE: Multiplanar, multisequence MR imaging of the shoulder was performed.
No intravenous contrast was administered.

[Series 4: T2 fat-sat · axial · 3.0mm · 0.23mm/px · z∈[+27,+89]mm · 5 of 22 slices shown (1 of 3)]
[im 1/22]
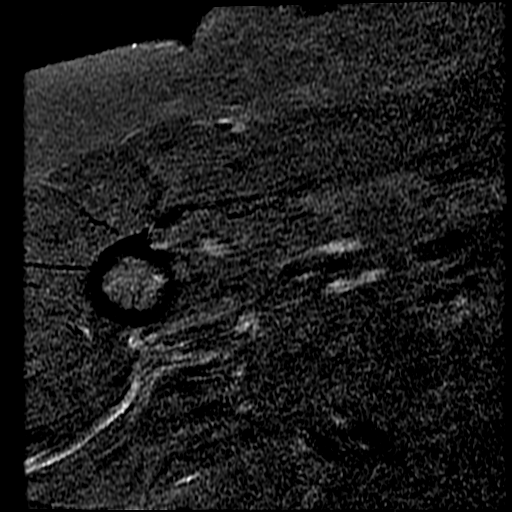
[im 4/22]
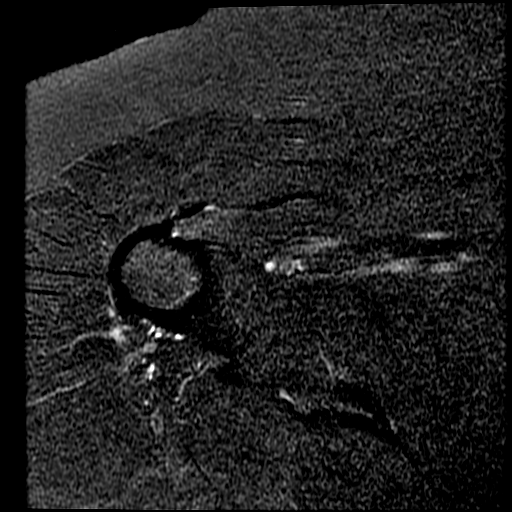
[im 7/22]
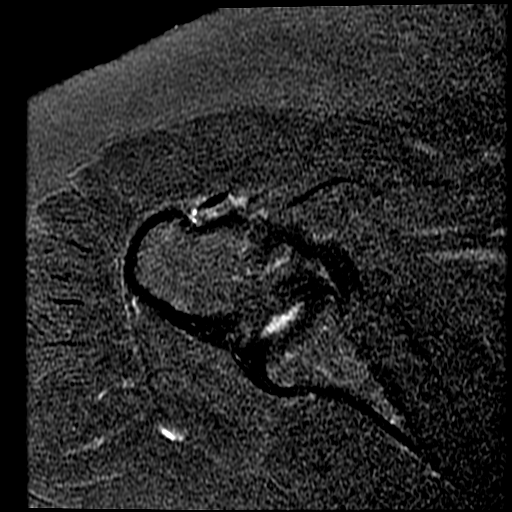
[im 13/22]
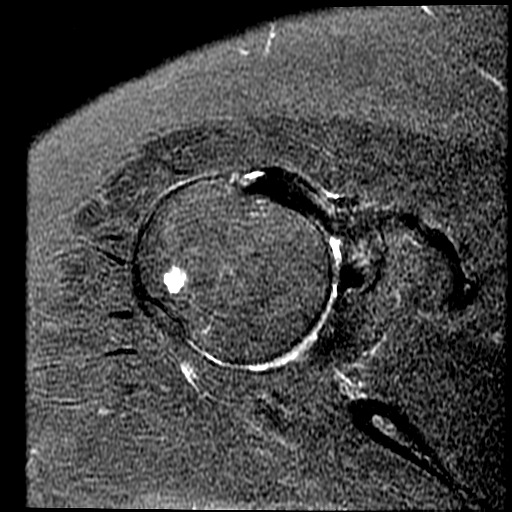
[im 19/22]
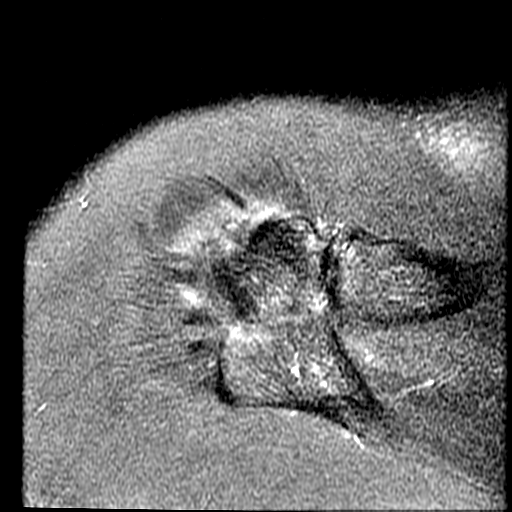

[Series 5: T2 fat-sat · oblique · 3.0mm · 0.27mm/px · 3 of 21 slices shown (2 of 3)]
[im 3/21]
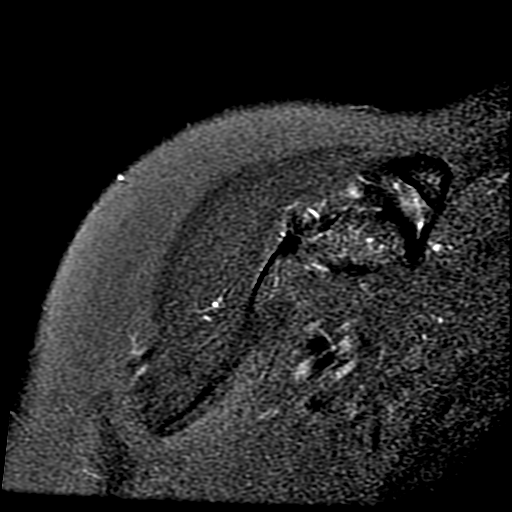
[im 12/21]
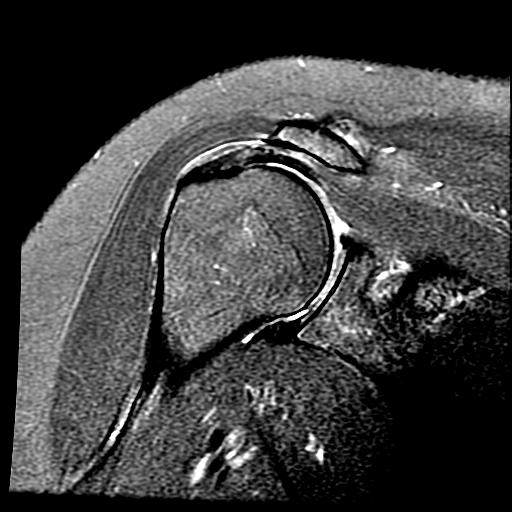
[im 18/21]
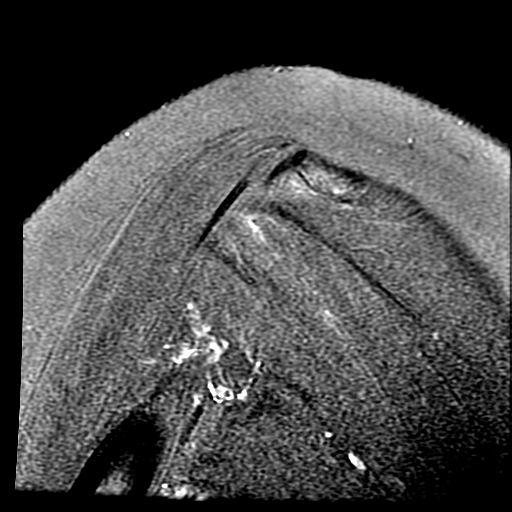

[Series 6: PD · oblique · 3.0mm · 0.27mm/px · 8 of 21 slices shown]
[im 1/21]
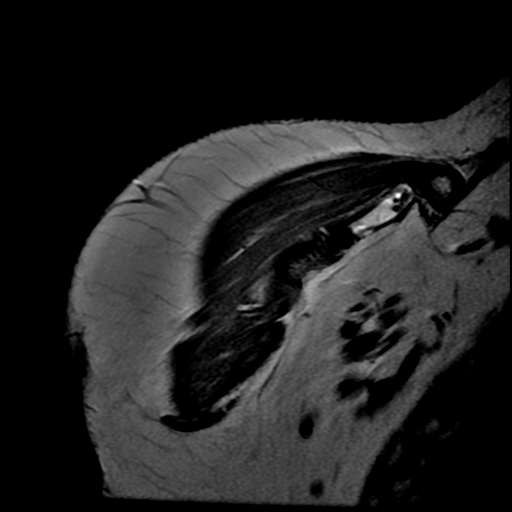
[im 3/21]
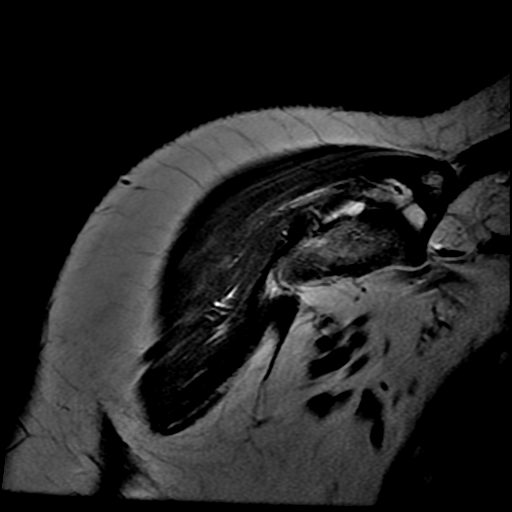
[im 6/21]
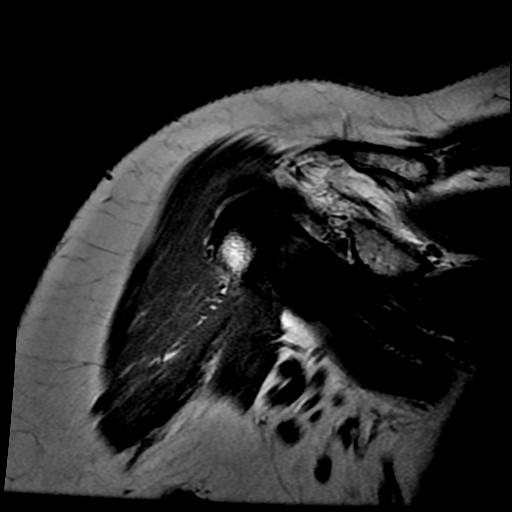
[im 9/21]
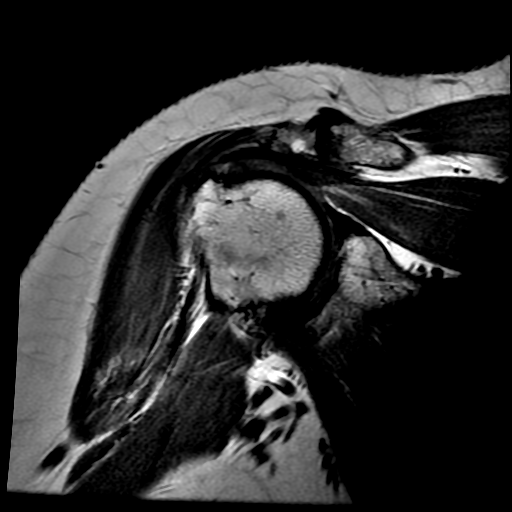
[im 12/21]
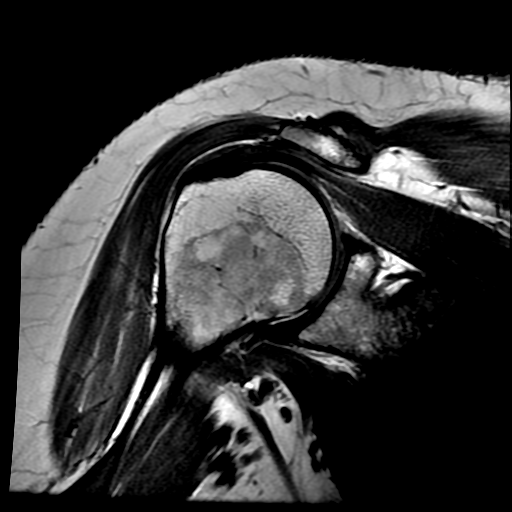
[im 15/21]
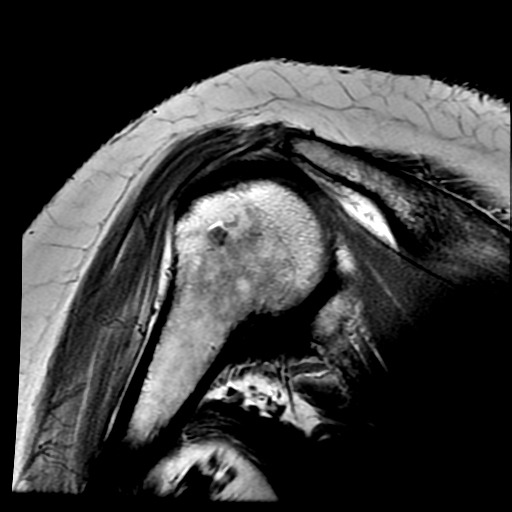
[im 18/21]
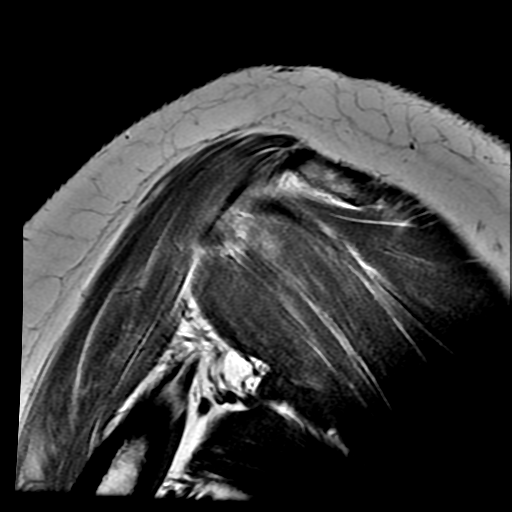
[im 21/21]
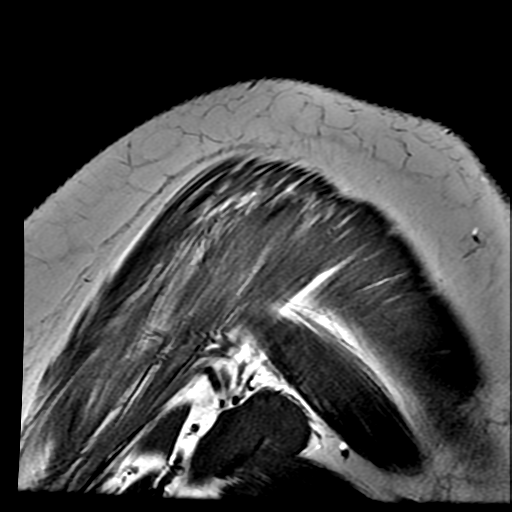

[Series 8: T2 fat-sat · oblique · 3.0mm · 0.27mm/px · 3 of 23 slices shown (3 of 3)]
[im 4/23]
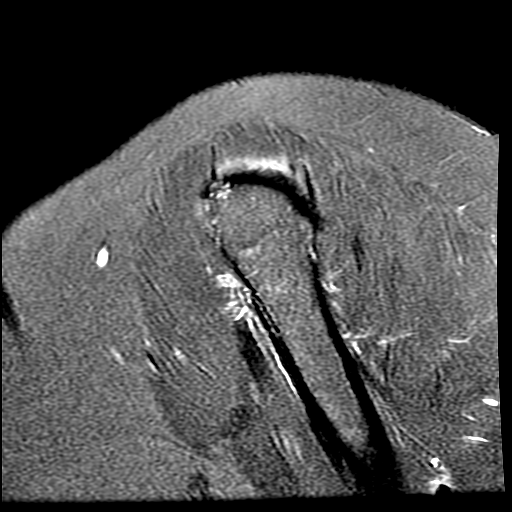
[im 13/23]
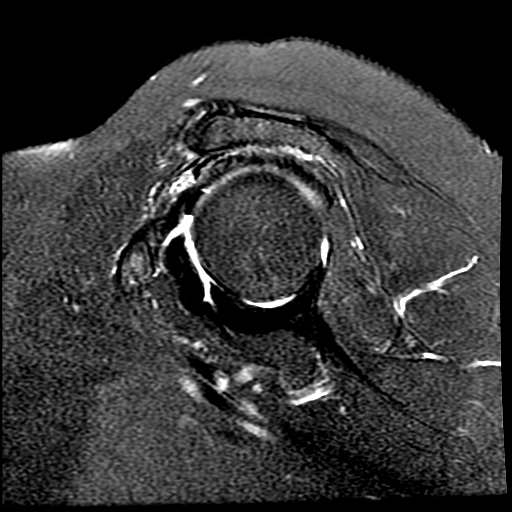
[im 19/23]
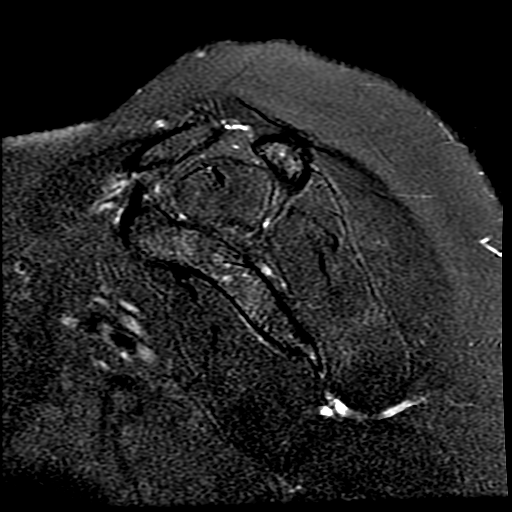

[19 of 40 positions shown; findings below may reference images not displayed]

FINDINGS: Rotator cuff: Small insertional intrasubstance tear of the distal
definite extension to the bursal or articular surfaces. Mild
supraspinatus tendinopathy.

Muscles:  Unremarkable

Biceps long head:  Unremarkable

Acromioclavicular Joint: Mild moderate spurring with low grade
subcortical marrow edema and minimal adjacent soft tissue edema.
Type II acromion. Trace subacromial subdeltoid bursitis.

Glenohumeral Joint: Mild degenerative chondral thinning.

Labrum:  Grossly unremarkable

Bones: Small probable degenerative subcortical cysts posteriorly
along the humeral head, versus less likely enchondroma.

Other: No supplemental non-categorized findings.
IMPRESSION: 1. Mild supraspinatus tendinopathy within insertional intrasubstance
tear of the distal supraspinatus tendon anteriorly, without definite
extension to the bursal or articular surface.
2. Trace subacromial subdeltoid bursitis.
3. Mild to moderate degenerative AC joint arthropathy. Mild
degenerative chondral thinning in the glenohumeral joint.

## 2019-01-17 ENCOUNTER — Other Ambulatory Visit: Payer: Self-pay | Admitting: Podiatry

## 2019-02-27 ENCOUNTER — Other Ambulatory Visit: Payer: Self-pay | Admitting: Internal Medicine

## 2019-02-27 DIAGNOSIS — M48061 Spinal stenosis, lumbar region without neurogenic claudication: Secondary | ICD-10-CM

## 2019-03-10 ENCOUNTER — Other Ambulatory Visit: Payer: Self-pay | Admitting: Internal Medicine

## 2019-03-21 NOTE — Progress Notes (Signed)
Virtual Visit via Video Note  I connected with Cynthia Kirk on 03/21/19 at 11:00 AM EDT by a video enabled telemedicine application and verified that I am speaking with the correct person using two identifiers.   I discussed the limitations of evaluation and management by telemedicine and the availability of in person appointments. The patient expressed understanding and agreed to proceed.  The patient is currently at home and I am in the office.    No referring provider.    History of Present Illness: She is here for follow up of her chronic medical conditions.   She is not currently exercising regularly.    Hypertension: She is taking her medication daily. She is compliant with a low sodium diet.  She denies chest pain, palpitations, edema, shortness of breath and regular headaches. She does monitor her blood pressure at home-it is typically 110/70.    Diabetes: She is taking her Victoza daily as prescribed. She is compliant with a diabetic diet.  She monitors her sugars and they have been running 89-95.  GERD:  She is taking her medication daily as prescribed.  She denies any GERD symptoms and feels her GERD is well controlled.   Anxiety: She is taking xanax on occasion at night.  She denies any side effects from the medication. She feels her anxiety is well controlled and she is happy with her current dose of medication.   Viral illness: Sometime in January she started experiencing cold symptoms.  She does wonder if she had COVID or possibly the flu.  Her husband was sick at the same time-his illness started the end of December.  In January she did have a temperature of 103 twice.  Last week she says she did have a fever of 100.  She did experience some night sweats, sore throat, dry cough, wheezing and shortness of breath along with headaches.  She also had diarrhea with a viral infection.  All of her symptoms have improved, but she is still experiencing a dry cough.  Asthma: Overall  her asthma has been well controlled.  Even with her recent viral infection she did not have a flareup of her asthma.  She has not needed to use her inhalers he was experiencing some wheezing and shortness of breath with her viral infection, but has not had any this week.  Social History   Socioeconomic History  . Marital status: Married    Spouse name: Not on file  . Number of children: 2  . Years of education: PHD  . Highest education level: Not on file  Occupational History  . Occupation: retired    Fish farm manager: UNEMPLOYED  Social Needs  . Financial resource strain: Not hard at all  . Food insecurity:    Worry: Never true    Inability: Never true  . Transportation needs:    Medical: No    Non-medical: No  Tobacco Use  . Smoking status: Never Smoker  . Smokeless tobacco: Never Used  Substance and Sexual Activity  . Alcohol use: Yes    Comment: 2018 - very occasionally  . Drug use: No  . Sexual activity: Yes    Birth control/protection: Post-menopausal  Lifestyle  . Physical activity:    Days per week: 4 days    Minutes per session: 50 min  . Stress: Not at all  Relationships  . Social connections:    Talks on phone: More than three times a week    Gets together: More than three times a  week    Attends religious service: More than 4 times per year    Active member of club or organization: Yes    Attends meetings of clubs or organizations: More than 4 times per year    Relationship status: Married  Other Topics Concern  . Not on file  Social History Narrative   Pt lives at home with her spouse.   She does not use caffeine.     Observations/Objective: Appears well in NAD  Blood pressure 110/70  Sugars 89-95  Lab Results  Component Value Date   WBC 6.3 09/19/2018   HGB 12.8 09/19/2018   HCT 39.7 09/19/2018   PLT 240.0 09/19/2018   GLUCOSE 88 09/19/2018   CHOL 171 09/19/2018   TRIG 122.0 09/19/2018   HDL 54.10 09/19/2018   LDLCALC 92 09/19/2018   ALT 11  09/19/2018   AST 18 09/19/2018   NA 138 09/19/2018   K 3.9 09/19/2018   CL 100 09/19/2018   CREATININE 1.02 09/19/2018   BUN 23 09/19/2018   CO2 31 09/19/2018   TSH 1.77 08/16/2017   HGBA1C 5.2 09/19/2018   MICROALBUR <0.7 07/23/2016     Assessment and Plan:  See Problem List for Assessment and Plan of chronic medical problems.   Follow Up Instructions:    I discussed the assessment and treatment plan with the patient. The patient was provided an opportunity to ask questions and all were answered. The patient agreed with the plan and demonstrated an understanding of the instructions.   The patient was advised to call back or seek an in-person evaluation if the symptoms worsen or if the condition fails to improve as anticipated.    [name], MD

## 2019-03-22 ENCOUNTER — Ambulatory Visit (INDEPENDENT_AMBULATORY_CARE_PROVIDER_SITE_OTHER): Payer: Medicare Other | Admitting: Internal Medicine

## 2019-03-22 ENCOUNTER — Encounter: Payer: Self-pay | Admitting: Internal Medicine

## 2019-03-22 DIAGNOSIS — I10 Essential (primary) hypertension: Secondary | ICD-10-CM

## 2019-03-22 DIAGNOSIS — J4520 Mild intermittent asthma, uncomplicated: Secondary | ICD-10-CM | POA: Diagnosis not present

## 2019-03-22 DIAGNOSIS — K219 Gastro-esophageal reflux disease without esophagitis: Secondary | ICD-10-CM | POA: Diagnosis not present

## 2019-03-22 DIAGNOSIS — E119 Type 2 diabetes mellitus without complications: Secondary | ICD-10-CM

## 2019-03-22 DIAGNOSIS — F419 Anxiety disorder, unspecified: Secondary | ICD-10-CM

## 2019-03-22 MED ORDER — HYDROQUINONE 4 % EX CREA: 1.0000 "application " | TOPICAL_CREAM | Freq: Two times a day (BID) | CUTANEOUS | 5 refills | Status: DC

## 2019-03-22 NOTE — Assessment & Plan Note (Signed)
No current wheezing or shortness of breath Residual cough from recent viral infection Has not needed to use either inhaler in a while

## 2019-03-22 NOTE — Assessment & Plan Note (Signed)
Blood pressure well controlled at home Continue current medication

## 2019-03-22 NOTE — Assessment & Plan Note (Signed)
Takes Xanax on occasion for anxiety Okay to continue

## 2019-03-22 NOTE — Assessment & Plan Note (Signed)
GERD controlled Continue daily medication  

## 2019-03-22 NOTE — Assessment & Plan Note (Signed)
Lab Results  Component Value Date   HGBA1C 5.2 09/19/2018   Sugars well controlled and in normal range Still using Victoza-may be able to discontinue, but will continue for now until we are able to check her A1c next Follow-up in 6 months

## 2019-04-02 ENCOUNTER — Other Ambulatory Visit: Payer: Self-pay | Admitting: Pulmonary Disease

## 2019-04-16 ENCOUNTER — Telehealth: Payer: Self-pay

## 2019-04-16 MED ORDER — FLUCONAZOLE 150 MG PO TABS: 150.0000 mg | ORAL_TABLET | Freq: Once | ORAL | 0 refills | Status: AC

## 2019-04-16 NOTE — Telephone Encounter (Signed)
Copied from [place] [id number]. Topic: General - Other >> Apr 16, 2019  1:32 PM Wynetta Emery, [place] C wrote: Reason for CRM: pt called in to request a call back from [place].   CB: [phone]

## 2019-04-16 NOTE — Telephone Encounter (Signed)
Called pt and she stated that she has a yeast infection. She said she takes 800 mg of ibuprofen daily which she says causes that for her. She wondering if she can get an rx for diflucan? States she has been fighting this for a month and usually gets this rx from another provider but they said to contact her PCP. Do you want this to be virtual?

## 2019-04-16 NOTE — Telephone Encounter (Signed)
Diflucan sent to  pof

## 2019-04-16 NOTE — Telephone Encounter (Signed)
Pt aware.

## 2019-05-08 ENCOUNTER — Other Ambulatory Visit: Payer: Self-pay | Admitting: Internal Medicine

## 2019-07-17 ENCOUNTER — Other Ambulatory Visit: Payer: Self-pay | Admitting: Internal Medicine

## 2019-07-23 ENCOUNTER — Other Ambulatory Visit: Payer: Self-pay | Admitting: Gastroenterology

## 2019-08-15 ENCOUNTER — Telehealth: Payer: Self-pay | Admitting: Pulmonary Disease

## 2019-08-15 ENCOUNTER — Ambulatory Visit (INDEPENDENT_AMBULATORY_CARE_PROVIDER_SITE_OTHER): Payer: Medicare Other | Admitting: Pulmonary Disease

## 2019-08-15 ENCOUNTER — Encounter: Payer: Self-pay | Admitting: Pulmonary Disease

## 2019-08-15 DIAGNOSIS — G4733 Obstructive sleep apnea (adult) (pediatric): Secondary | ICD-10-CM

## 2019-08-15 DIAGNOSIS — J0100 Acute maxillary sinusitis, unspecified: Secondary | ICD-10-CM

## 2019-08-15 DIAGNOSIS — J45998 Other asthma: Secondary | ICD-10-CM

## 2019-08-15 DIAGNOSIS — D860 Sarcoidosis of lung: Secondary | ICD-10-CM | POA: Diagnosis not present

## 2019-08-15 DIAGNOSIS — Z9989 Dependence on other enabling machines and devices: Secondary | ICD-10-CM

## 2019-08-15 MED ORDER — AMOXICILLIN-POT CLAVULANATE 875-125 MG PO TABS: 1.0000 | ORAL_TABLET | Freq: Two times a day (BID) | ORAL | 1 refills | Status: DC

## 2019-08-15 MED ORDER — FLUCONAZOLE 100 MG PO TABS: 100.0000 mg | ORAL_TABLET | ORAL | 0 refills | Status: DC

## 2019-08-15 NOTE — Progress Notes (Signed)
Cynthia Kirk, Cynthia Kirk, Cynthia Kirk  Chief Complaint  Patient presents with  . Follow-up    having congestion, face pain, heachache, sinus pressure, DME [employer], denies cpap problems    Constitutional:  LMP 02/14/2014   Deferred.  Past Medical History:  DM, Spinal stenosis, PNA, Anemia, HH, HSV, C diff, GERD, Anxiety, Colon polyp  Brief Summary:  Cynthia Kirk is a 68 y.o. female with Kirk sarcoidosis, asthma, Cynthia obstructive sleep apnea.  Virtual Visit via Telephone Note  I connected with Cynthia Kirk on 08/15/19 at 11:30 AM EDT by telephone Cynthia verified that I am speaking with the correct person using two identifiers.  Location: Patient: home Provider: medical office   I discussed the limitations, risks, security Cynthia privacy concerns of performing an evaluation Cynthia management service by telephone Cynthia the availability of in person appointments. I also discussed with the patient that there may be a patient responsible charge related to this service. The patient expressed understanding Cynthia agreed to proceed.   For the past several days she has been feeling congested in her sinuses.  This is mostly on the right side.  She feels her face is puffy on the right side.  She has been feeling warm, but not sure about whether she has a fever.  She has noticed things tasty funny, but she can still taste.  Having yellow drainage from her sinuses.  Not having chest pain, wheeze, or hemoptysis.  Has been trying to keep up with CPAP, but harder over last couple days with sinus issues.  Denies travel or sick exposures.  No GI symptoms.  Physical Exam:  Deferred.  Assessment/Plan:   Acute sinusitis. - with have her take augmentin for one week, Cynthia refill for second week if needed - nasal irrigation, flonase as needed - diflucan x 2 doses to prevent development of candidal vaginitis  Kirk sarcoidosis. - don't think this is active at present - monitor clinically   Persistent asthma. - doesn't seem like she needs prednisone at present since her current symptoms are upper airway related - continue flovent, singulair, Cynthia prn albuterol  Obstructive sleep apnea. - she is compliant with CPAP Cynthia reports benefit - continue CPAP 10 cm H2O  Patient Instructions  Augmentin 1 pill in the morning Cynthia one pill at night for 7 days; can refill for another week if needed. Fluconazole 100 mg every other day for 2 doses. Use saline nasal spray daily. Can use flonase one spray in each nostril until sinus congestion better, then as needed.  Follow up in 1 year   I discussed the assessment Cynthia treatment plan with the patient. The patient was provided an opportunity to ask questions Cynthia all were answered. The patient agreed with the plan Cynthia demonstrated an understanding of the instructions.   The patient was advised to call back or seek an in-person evaluation if the symptoms worsen or if the condition fails to improve as anticipated.  I provided 24 minutes of non-face-to-face time during this encounter.   Chesley Mires, MD  Chesley Mires, MD [place] Pager: [phone] 08/15/2019, 11:59 AM  Flow Sheet     Kirk tests:  Spirometry 06/30/09 >> FEV1 1.69(87%), FVC 2.17(83%), FEV1% 78 PFT 11/15/14 >> FEV1 1.76 (87%), FEV1% 77, TLC 3.19 (63%), DLCO 74%, no BD PFT 12/27/17 >> FEV1 1.14 (59%), FEV1% 78, TLC 3.75 (74%), DLCO 61  Chest imaging:  CT chest 07/17/14 >> upper lobe predominant BTX, GGO, nodularity no change since 2011 HRCT  chest 12/20/17 >> upper lobe fibrosis  Sleep tests:  PSG 05/28/14 >> AHI 5.8, SaO2 low 85%. REM AHI 29.3 ONO with CPAP 12/08/17 >>test time 2 hrs 26 min. Average SpO2 94%, low SpO2 81%. Spent 16.1 min with SpO2 <88%. CPAP titration 02/21/18 >> CPAP 10 cm H2O; didn't need supplemental oxygen. CPAP 07/15/19 to 08/13/19 >> used on 30 of 30 nights with average 8 hrs 47 min.  Average AHI 1.1 with CPAP 10 cm H2O   Cardiac tests:  Echo 03/09/17 >> EF 55 to 60%  Medications:   Allergies as of 08/15/2019      Reactions   Aspirin Hives   Ibuprofen Other (See Comments)   Upsets stomach   Latex Rash      Medication List       Accurate as of August 15, 2019 11:59 AM. If you have any questions, ask your nurse or doctor.        ALPRAZolam 0.5 MG tablet Commonly known as: XANAX Take 1 tablet (0.5 mg total) by mouth daily as needed. for anxiety   BD Pen Needle Nano U/F 32G X 4 MM Misc Generic drug: Insulin Pen Needle USE 3 TIMES DAILY AS DIRECTED   Bystolic 5 MG tablet Generic drug: nebivolol TAKE 1 TABLET BY MOUTH DAILY   CoQ10 100 MG Caps Take 100 mg by mouth daily.   cyclobenzaprine 10 MG tablet Commonly known as: FLEXERIL TAKE 1/2 TO 1 TABLET 3 TIMES DAILY AS NEEDED FOR MUSCLE SPASMS.   Dexilant 60 MG capsule Generic drug: dexlansoprazole TAKE 1 CAPSULE BY MOUTH DAILY   famotidine 20 MG tablet Commonly known as: Pepcid Take 2 tablets (40 mg total) by mouth 2 (two) times daily.   fluticasone 44 MCG/ACT inhaler Commonly known as: Flovent HFA Inhale 2 puffs into the lungs 2 (two) times daily. What changed:   when to take this  reasons to take this   gabapentin 100 MG capsule Commonly known as: NEURONTIN TAKE 1 CAPSULE BY MOUTH AT BEDTIME   hydroquinone 4 % cream Apply 1 application topically 2 (two) times daily.   ipratropium 0.06 % nasal spray Commonly known as: ATROVENT Place 2 sprays into both nostrils 2 (two) times daily.   montelukast 10 MG tablet Commonly known as: SINGULAIR TAKE 1 TABLET BY MOUTH DAILY AT BEDTIME   olmesartan-hydrochlorothiazide 40-25 MG tablet Commonly known as: [name] HCT TAKE 1 TABLET BY MOUTH DAILY   Probiotic & Acidophilus Ex St Caps Take 1 capsule by mouth 2 (two) times daily. What changed: when to take this   triamcinolone ointment 0.5 % Commonly known as: KENALOG APPLY TO AFFECTED AREA TWICE A DAY   Ventolin HFA 108  (90 Base) MCG/ACT inhaler Generic drug: albuterol INHALE 2 PUFF  BY MOUTH  EVERY 6 HOUR as NEEDED FOR WHEEZING ,SHORTNESS OF BREATH What changed: See the new instructions.   albuterol (2.5 MG/3ML) 0.083% nebulizer solution Commonly known as: PROVENTIL TAKE 3ML(2.5MG ) BY NEBULIZATION EVERY 6 HOURS AS NEEDED FOR SHORTNESS OF BREATH What changed: Another medication with the same name was changed. Make sure you understand how Cynthia when to take each.   Victoza 18 MG/3ML Sopn Generic drug: liraglutide INJECT 0.3 ML (1.8MG ) BY SUBCUTENEOUS ROUTE DAILY   Womens 50+ Multi Vitamin/Min Tabs Take 1 tablet by mouth daily.       Past Surgical History:  She  has a past surgical history that includes Vulvectomy (~ 2007); Ectopic pregnancy surgery (1976; ~ 1978); Urethral sling (2011); Dilation Cynthia curettage  of uterus (2007); Posterior laminectomy / decompression lumbar spine (09/05/2013); Thoracic laminectomy (09/05/2013); Posterior lumbar fusion (09/05/2013); Hysteroscopy diagnostic (2011); Colposcopy vulva w/ biopsy; Maximum access (mas)posterior lumbar interbody fusion (plif) 1 level (N/A, 09/05/2013); Cynthia Lumbar laminectomy/decompression microdiscectomy (N/A, 09/05/2013).  Family History:  Her family history includes Asthma in her daughter Cynthia son; Cancer in her paternal uncle; Diabetes in her father Cynthia another family member; Heart attack in her father Cynthia sister; High Cholesterol in her brother; Hypertension in her father; Kidney disease in her sister Cynthia another family member; Lung cancer in her maternal uncle; Migraines in her father; Osteoporosis in her sister Cynthia sister; Seizures in her father.  Social History:  She  reports that she has never smoked. She has never used smokeless tobacco. She reports current alcohol use. She reports that she does not use drugs.

## 2019-08-15 NOTE — Telephone Encounter (Signed)
Returned call to patient.  States she has had sinus congestion for about 3 days and now headaches.  Ears feel full.  Has CPAP follow up today and due to feeling sick will change to mychart virtual visit.  Patient states she has used Pharmacist, community before.  Updated Albin Fischer who is working with Dr. Halford Chessman today and visit changed to [facility] video.    Patient denies fever, cough or SOB or any other suspicion for covid related illness.  Nothing further needed.

## 2019-08-15 NOTE — Patient Instructions (Signed)
Augmentin 1 pill in the morning and one pill at night for 7 days; can refill for another week if needed. Fluconazole 100 mg every other day for 2 doses. Use saline nasal spray daily. Can use flonase one spray in each nostril until sinus congestion better, then as needed.  Follow up in 1 year

## 2019-08-16 ENCOUNTER — Other Ambulatory Visit: Payer: Self-pay

## 2019-08-16 MED ORDER — ALPRAZOLAM 0.5 MG PO TABS: 0.5000 mg | ORAL_TABLET | Freq: Every day | ORAL | 0 refills | Status: DC | PRN

## 2019-08-16 NOTE — Telephone Encounter (Signed)
Last OV 03/22/19 Next OV 08/22/19 Last RF 12/15/18

## 2019-08-22 ENCOUNTER — Ambulatory Visit: Payer: Medicare Other

## 2019-08-22 ENCOUNTER — Ambulatory Visit: Payer: Medicare Other | Admitting: Internal Medicine

## 2019-08-22 DIAGNOSIS — J019 Acute sinusitis, unspecified: Secondary | ICD-10-CM | POA: Diagnosis not present

## 2019-08-22 DIAGNOSIS — J300 Vasomotor rhinitis: Secondary | ICD-10-CM | POA: Diagnosis not present

## 2019-08-30 ENCOUNTER — Ambulatory Visit: Payer: Medicare Other | Admitting: Internal Medicine

## 2019-09-05 ENCOUNTER — Other Ambulatory Visit: Payer: Self-pay

## 2019-09-05 MED ORDER — NEBIVOLOL HCL 5 MG PO TABS: 5.0000 mg | ORAL_TABLET | Freq: Every day | ORAL | 0 refills | Status: DC

## 2019-09-11 ENCOUNTER — Ambulatory Visit: Payer: Medicare Other | Admitting: Internal Medicine

## 2019-09-12 ENCOUNTER — Other Ambulatory Visit: Payer: Self-pay | Admitting: Internal Medicine

## 2019-09-12 ENCOUNTER — Telehealth: Payer: Self-pay | Admitting: Internal Medicine

## 2019-09-12 ENCOUNTER — Other Ambulatory Visit: Payer: Self-pay | Admitting: Sports Medicine

## 2019-09-12 NOTE — Telephone Encounter (Signed)
Patient will give office a call back to schedule. SF

## 2019-09-13 ENCOUNTER — Ambulatory Visit: Payer: Medicare Other | Admitting: Gastroenterology

## 2019-09-18 DIAGNOSIS — M13862 Other specified arthritis, left knee: Secondary | ICD-10-CM | POA: Diagnosis not present

## 2019-09-18 DIAGNOSIS — M13861 Other specified arthritis, right knee: Secondary | ICD-10-CM | POA: Diagnosis not present

## 2019-09-19 ENCOUNTER — Encounter: Payer: Self-pay | Admitting: Internal Medicine

## 2019-09-19 NOTE — Patient Instructions (Addendum)
  Tests ordered today. Your results will be released to [facility] (or called to you) after review.  If any changes need to be made, you will be notified at that same time.  Flu immunization administered today.    Medications reviewed and updated.  Changes include :   none   Please followup in 6 months   [facility]  [address]  [place], [place] [address]  Main: [place] [address] [place], [place] [address] [phone]   [facility] [address] [place], [place] Across the street from [facility] [phone]

## 2019-09-19 NOTE — Progress Notes (Signed)
Subjective:    Patient ID: Cynthia Kirk, female    DOB: 12-31-1950, 68 y.o.   MRN: [id number]  HPI The patient is here for follow up.  She is not exercising regularly.  She is trying to be active at home.    Hypertension: She is taking her medication daily. She is compliant with a low sodium diet.  She denies chest pain, palpitations, edema, shortness of breath and regular headaches. She does monitor her blood pressure at home.    Diabetes: She is taking her medication daily as prescribed. She is compliant with a diabetic diet.  She denies numbness/tingling in her feet and foot lesions.   GERD:  She is taking her medication daily as prescribed.  She denies any GERD symptoms and feels her GERD is well controlled.   Anxiety: She is taking the xanax occasionally at night. She denies any side effects from the medication. She feels her anxiety is well controlled and she is happy with her current dose of medication.   Lumbar spinal stenosis:  She is taking flexeril as needed.  She takes gabapentin at night.    B/l knee OA:  She saw ortho and had injections. They are feeling better.     Medications and allergies reviewed with patient and updated if appropriate.  Patient Active Problem List   Diagnosis Date Noted  . Neck pain on left side 08/03/2018  . Atrophic vaginitis 02/23/2018  . Postmenopausal bleeding 02/23/2018  . Osteopenia 02/23/2018  . Urinary incontinence 02/23/2018  . Right shoulder pain 08/16/2017  . Vasomotor rhinitis 07/26/2017  . Anxiety 02/01/2017  . Renal cyst 07/23/2016  . Cervical disc disorder with radiculopathy of cervical region 03/13/2016  . B12 deficiency   . Cataract 02/25/2014  . Glaucoma suspect 02/25/2014  . Cough 10/13/2013  . Lumbar spinal stenosis 03/07/2013  . Ejection fraction   . Syncope   . Severe obesity (BMI >= 40) ([place]) 09/14/2010  . Essential hypertension 09/14/2010  . GERD 08/22/2009  . Pulmonary sarcoidosis ([place]) 06/30/2009  .  Diabetes mellitus ([place]) 06/30/2009  . OSA on CPAP 06/30/2009  . Upper airway cough syndrome 06/30/2009  . Mild intermittent asthma 06/30/2009    Current Outpatient Medications on File Prior to Visit  Medication Sig Dispense Refill  . albuterol (PROVENTIL) (2.5 MG/3ML) 0.083% nebulizer solution TAKE 3ML(2.5MG ) BY NEBULIZATION EVERY 6 HOURS AS NEEDED FOR SHORTNESS OF BREATH 75 mL 11  . ALPRAZolam (XANAX) 0.5 MG tablet Take 1 tablet (0.5 mg total) by mouth daily as needed. for anxiety 90 tablet 0  . amoxicillin-clavulanate (AUGMENTIN) 875-125 MG tablet Take 1 tablet by mouth 2 (two) times daily. 14 tablet 1  . BD PEN NEEDLE NANO U/F 32G X 4 MM MISC USE 3 TIMES DAILY AS DIRECTED 100 each 5  . Coenzyme Q10 (COQ10) 100 MG CAPS Take 100 mg by mouth daily.     . cyclobenzaprine (FLEXERIL) 10 MG tablet TAKE 1/2 TO 1 TABLET 3 TIMES DAILY AS NEEDED FOR MUSCLE SPASMS. 90 tablet 0  . DEXILANT 60 MG capsule TAKE 1 CAPSULE BY MOUTH DAILY 90 capsule 0  . famotidine (PEPCID) 20 MG tablet Take 2 tablets (40 mg total) by mouth 2 (two) times daily. 120 tablet 1  . fluticasone (FLOVENT HFA) 44 MCG/ACT inhaler Inhale 2 puffs into the lungs 2 (two) times daily. 3 Inhaler 3  . gabapentin (NEURONTIN) 100 MG capsule TAKE 1 CAPSULE BY MOUTH AT BEDTIME 90 capsule 11  . hydroquinone 4 % cream  Apply 1 application topically 2 (two) times daily. 28.35 g 5  . Ibuprofen-Famotidine (DUEXIS) 800-26.6 MG TABS Duexis 800 mg-26.6 mg tablet    . ipratropium (ATROVENT) 0.06 % nasal spray Place 2 sprays into both nostrils 2 (two) times daily.     . montelukast (SINGULAIR) 10 MG tablet TAKE 1 TABLET BY MOUTH DAILY AT BEDTIME 90 tablet 0  . Multiple Vitamins-Minerals (WOMENS 50+ MULTI VITAMIN/MIN) TABS Take 1 tablet by mouth daily.    . nebivolol (BYSTOLIC) 5 MG tablet Take 1 tablet (5 mg total) by mouth daily. 90 tablet 0  . olmesartan-hydrochlorothiazide ([name] HCT) 40-25 MG tablet TAKE 1 TABLET BY MOUTH DAILY 90 tablet 0  .  Probiotic Product (PROBIOTIC & ACIDOPHILUS EX ST) CAPS Take 1 capsule by mouth 2 (two) times daily. (Patient taking differently: Take 1 capsule by mouth daily. ) 14 capsule 0  . triamcinolone ointment (KENALOG) 0.5 % APPLY TO AFFECTED AREA TWICE A DAY 30 g 0  . VENTOLIN HFA 108 (90 Base) MCG/ACT inhaler INHALE 2 PUFF  BY MOUTH  EVERY 6 HOUR as NEEDED FOR WHEEZING ,SHORTNESS OF BREATH (Patient taking differently: Inhale 2 puffs into the lungs every 6 (six) hours as needed for wheezing or shortness of breath. ) 54 each 2  . VICTOZA 18 MG/3ML SOPN INJECT 0.3 ML (1.8MG ) BY SUBCUTENEOUS ROUTE DAILY 9 mL 2   Current Facility-Administered Medications on File Prior to Visit  Medication Dose Route Frequency Provider Last Rate Last Dose  . triamcinolone acetonide (KENALOG) 10 MG/ML injection 10 mg  10 mg Other Once Landis Martins, DPM        Past Medical History:  Diagnosis Date  . Abnormal Pap smear 2008  . Adenomatous colon polyp   . Allergic rhinitis, cause unspecified   . Anxiety   . ASCUS with positive high risk HPV   . Blood transfusion 1976   Due to ectopic pregnancy  . Chronic back pain   . Ejection fraction    EF 45-50%, echo, October 27, 2011  . Endometrial mass 2011  . Esophageal reflux   . Fibroid   . H/O Clostridium difficile infection 03/2010  . H/O menorrhagia 2011  . Herpes simplex type 2 infection complicating pregnancy   . Hiatal hernia   . Iron deficiency anemia   . Morbid obesity ([place]) 2010  . Obstructive sleep apnea (adult) (pediatric)    cpap  . Panic attacks   . Plantar fasciitis   . Pneumonia 2005; 2007; 2009; 2011  . Sarcoidosis 1996   @ [employer]  . Simple endometrial hyperplasia 2011  . Spinal stenosis, lumbar    s/p decompression laminectomy 09/2013  . Syncope    ?? Syncope ??  . Type 2 diabetes, diet controlled ([place])   . Unspecified asthma(493.90)   . Urinary incontinence, mixed 2011  . VIN II (vulvar intraepithelial neoplasia II)     Past Surgical  History:  Procedure Laterality Date  . COLPOSCOPY VULVA W/ BIOPSY     "I've had maybe 3" (09/05/2013)  . DILATION AND CURETTAGE OF UTERUS  2007  . [place]; ~ 1978  . HYSTEROSCOPY DIAGNOSTIC  2011  . LUMBAR LAMINECTOMY/DECOMPRESSION MICRODISCECTOMY N/A 09/05/2013   Procedure: Thoracic eleven-twelve Thoracic Laminectomy;  Surgeon: Eustace Moore, MD;  Location: [facility];  Service: Neurosurgery;  Laterality: N/A;  Thoracic eleven-twelve Thoracic Laminectomy  . MAXIMUM ACCESS (MAS)POSTERIOR LUMBAR INTERBODY FUSION (PLIF) 1 LEVEL N/A 09/05/2013   Procedure: Lumbar four-five Maximum Access Surgery  Posterior lumbar interbody fusion;  Surgeon: Eustace Moore, MD;  Location: [facility];  Service: Neurosurgery;  Laterality: N/A;  Lumbar four-five Maximum Access Surgery  Posterior lumbar interbody fusion  . POSTERIOR LAMINECTOMY / DECOMPRESSION LUMBAR SPINE  09/05/2013  . POSTERIOR LUMBAR FUSION  09/05/2013  . THORACIC LAMINECTOMY  09/05/2013  . URETHRAL SLING  2011  . VULVECTOMY  ~ 2007   "for cancerous cells" (09/05/2013)    Social History   Socioeconomic History  . Marital status: Married    Spouse name: Not on file  . Number of children: 2  . Years of education: PHD  . Highest education level: Not on file  Occupational History  . Occupation: retired    Fish farm manager: UNEMPLOYED  Social Needs  . Financial resource strain: Not hard at all  . Food insecurity    Worry: Never true    Inability: Never true  . Transportation needs    Medical: No    Non-medical: No  Tobacco Use  . Smoking status: Never Smoker  . Smokeless tobacco: Never Used  Substance and Sexual Activity  . Alcohol use: Yes    Comment: 2018 - very occasionally  . Drug use: No  . Sexual activity: Yes    Birth control/protection: Post-menopausal  Lifestyle  . Physical activity    Days per week: 4 days    Minutes per session: 50 min  . Stress: Not at all  Relationships  . Social connections     Talks on phone: More than three times a week    Gets together: More than three times a week    Attends religious service: More than 4 times per year    Active member of club or organization: Yes    Attends meetings of clubs or organizations: More than 4 times per year    Relationship status: Married  Other Topics Concern  . Not on file  Social History Narrative   Pt lives at home with her spouse.   She does not use caffeine.    Family History  Problem Relation Age of Onset  . Heart attack Father   . Hypertension Father   . Diabetes Father   . Seizures Father   . Migraines Father   . Kidney disease Sister   . Osteoporosis Sister   . Heart attack Sister   . Cancer Paternal Uncle   . Asthma Daughter   . Asthma Son   . Osteoporosis Sister   . Lung cancer Maternal Uncle   . Kidney disease Other   . Diabetes Other   . High Cholesterol Brother        x2    Review of Systems  Constitutional: Negative for chills and fever.  Respiratory: Negative for cough, shortness of breath and wheezing.   Cardiovascular: Positive for leg swelling (occ). Negative for chest pain and palpitations.  Gastrointestinal: Negative for abdominal pain.  Neurological: Positive for light-headedness and headaches.       Objective:   Vitals:   09/20/19 0853  BP: 118/76  Pulse: 86  Temp: 98.3 F (36.8 C)  SpO2: 96%   BP Readings from Last 3 Encounters:  09/20/19 118/76  09/19/18 118/68  09/19/18 118/68   Wt Readings from Last 3 Encounters:  09/20/19 252 lb 1.9 oz (114.4 kg)  09/19/18 240 lb (108.9 kg)  09/19/18 240 lb (108.9 kg)   Body mass index is 44.66 kg/m.   Physical Exam    Constitutional: Appears well-developed and well-nourished. No distress.  HENT:  Head: Normocephalic and atraumatic.  Neck: Neck supple. No tracheal deviation present. No thyromegaly present.  No cervical lymphadenopathy Cardiovascular: Normal rate, regular rhythm and normal heart sounds.   No murmur heard.  No carotid bruit .  No edema Pulmonary/Chest: Effort normal and breath sounds normal. No respiratory distress. No has no wheezes. No rales.  Skin: Skin is warm and dry. Not diaphoretic.  Psychiatric: Normal mood and affect. Behavior is normal.      Assessment & Plan:    See Problem List for Assessment and Plan of chronic medical problems.

## 2019-09-20 ENCOUNTER — Other Ambulatory Visit (INDEPENDENT_AMBULATORY_CARE_PROVIDER_SITE_OTHER): Payer: Medicare Other

## 2019-09-20 ENCOUNTER — Encounter: Payer: Self-pay | Admitting: Internal Medicine

## 2019-09-20 ENCOUNTER — Other Ambulatory Visit: Payer: Self-pay

## 2019-09-20 ENCOUNTER — Ambulatory Visit (INDEPENDENT_AMBULATORY_CARE_PROVIDER_SITE_OTHER): Payer: Medicare Other | Admitting: Internal Medicine

## 2019-09-20 VITALS — BP 118/76 | HR 86 | Temp 98.3°F | Ht 63.0 in | Wt 218.1 lb

## 2019-09-20 DIAGNOSIS — J4520 Mild intermittent asthma, uncomplicated: Secondary | ICD-10-CM

## 2019-09-20 DIAGNOSIS — K219 Gastro-esophageal reflux disease without esophagitis: Secondary | ICD-10-CM | POA: Diagnosis not present

## 2019-09-20 DIAGNOSIS — Z23 Encounter for immunization: Secondary | ICD-10-CM | POA: Diagnosis not present

## 2019-09-20 DIAGNOSIS — M48061 Spinal stenosis, lumbar region without neurogenic claudication: Secondary | ICD-10-CM

## 2019-09-20 DIAGNOSIS — I10 Essential (primary) hypertension: Secondary | ICD-10-CM

## 2019-09-20 DIAGNOSIS — F419 Anxiety disorder, unspecified: Secondary | ICD-10-CM | POA: Diagnosis not present

## 2019-09-20 DIAGNOSIS — E119 Type 2 diabetes mellitus without complications: Secondary | ICD-10-CM

## 2019-09-20 LAB — COMPREHENSIVE METABOLIC PANEL
ALT: 8 U/L (ref 0–35)
AST: 18 U/L (ref 0–37)
Albumin: 4.1 g/dL (ref 3.5–5.2)
Alkaline Phosphatase: 118 U/L — ABNORMAL HIGH (ref 39–117)
BUN: 22 mg/dL (ref 6–23)
CO2: 28 mEq/L (ref 19–32)
Calcium: 9.5 mg/dL (ref 8.4–10.5)
Chloride: 100 mEq/L (ref 96–112)
Creatinine, Ser: 0.89 mg/dL (ref 0.40–1.20)
GFR: 62.96 mL/min (ref >60.00)
Glucose, Bld: 86 mg/dL (ref 70–99)
Potassium: 3.5 mEq/L (ref 3.5–5.1)
Sodium: 137 mEq/L (ref 135–145)
Total Bilirubin: 0.5 mg/dL (ref 0.2–1.2)
Total Protein: 7.3 g/dL (ref 6.0–8.3)

## 2019-09-20 LAB — LIPID PANEL
Cholesterol: 182 mg/dL (ref 0–200)
HDL: 65.4 mg/dL (ref >39.00)
LDL Cholesterol: 105 mg/dL — ABNORMAL HIGH (ref 0–99)
NonHDL: 116.46
Total CHOL/HDL Ratio: 3
Triglycerides: 57 mg/dL (ref 0.0–149.0)
VLDL: 11.4 mg/dL (ref 0.0–40.0)

## 2019-09-20 LAB — CBC WITH DIFFERENTIAL/PLATELET
Basophils Absolute: 0 10*3/uL (ref 0.0–0.1)
Basophils Relative: 0.7 % (ref 0.0–3.0)
Eosinophils Absolute: 0.1 10*3/uL (ref 0.0–0.7)
Eosinophils Relative: 1.5 % (ref 0.0–5.0)
HCT: 37.6 % (ref 36.0–46.0)
Hemoglobin: 12.2 g/dL (ref 12.0–15.0)
Lymphocytes Relative: 24.8 % (ref 12.0–46.0)
Lymphs Abs: 1.5 10*3/uL (ref 0.7–4.0)
MCHC: 32.4 g/dL (ref 30.0–36.0)
MCV: 87.7 fl (ref 78.0–100.0)
Monocytes Absolute: 0.5 10*3/uL (ref 0.1–1.0)
Monocytes Relative: 7.7 % (ref 3.0–12.0)
Neutro Abs: 4 10*3/uL (ref 1.4–7.7)
Neutrophils Relative %: 65.3 % (ref 43.0–77.0)
Platelets: 270 10*3/uL (ref 150.0–400.0)
RBC: 4.29 Mil/uL (ref 3.87–5.11)
RDW: 14.4 % (ref 11.5–15.5)
WBC: 6.1 10*3/uL (ref 4.0–10.5)

## 2019-09-20 LAB — HEMOGLOBIN A1C: Hgb A1c MFr Bld: 5.5 % (ref 4.6–6.5)

## 2019-09-20 NOTE — Assessment & Plan Note (Signed)
Controlled, stable Continue current dose of medication -  Xanax prn 

## 2019-09-20 NOTE — Assessment & Plan Note (Signed)
Taking gabapentin nightly Taking flexeril prn

## 2019-09-20 NOTE — Assessment & Plan Note (Signed)
Agrees to see a nutritionist - referred Decrease portions - eating fairly healthy Increase activity

## 2019-09-20 NOTE — Assessment & Plan Note (Addendum)
Lab Results  Component Value Date   HGBA1C 5.2 09/19/2018   Very well controlled a1c today Discussed victoza - she wants to continue it - it is helping her maintain normal sugars and helps with weight

## 2019-09-20 NOTE — Assessment & Plan Note (Signed)
GERD controlled Continue daily medication  

## 2019-09-20 NOTE — Assessment & Plan Note (Signed)
No recent issues Uses rescue inhaler prn

## 2019-09-20 NOTE — Assessment & Plan Note (Signed)
BP well controlled Current regimen effective and well tolerated Continue current medications at current doses cmp  

## 2019-10-18 ENCOUNTER — Ambulatory Visit: Payer: Medicare Other | Admitting: Gastroenterology

## 2019-10-18 ENCOUNTER — Other Ambulatory Visit: Payer: Self-pay | Admitting: Gastroenterology

## 2019-10-30 ENCOUNTER — Other Ambulatory Visit: Payer: Self-pay | Admitting: Internal Medicine

## 2019-10-30 DIAGNOSIS — M48061 Spinal stenosis, lumbar region without neurogenic claudication: Secondary | ICD-10-CM

## 2019-10-31 ENCOUNTER — Other Ambulatory Visit: Payer: Self-pay

## 2019-11-20 ENCOUNTER — Ambulatory Visit (INDEPENDENT_AMBULATORY_CARE_PROVIDER_SITE_OTHER): Payer: Medicare Other | Admitting: Gastroenterology

## 2019-11-20 ENCOUNTER — Encounter: Payer: Self-pay | Admitting: Gastroenterology

## 2019-11-20 VITALS — Ht 63.0 in | Wt 218.0 lb

## 2019-11-20 DIAGNOSIS — Z1212 Encounter for screening for malignant neoplasm of rectum: Secondary | ICD-10-CM

## 2019-11-20 DIAGNOSIS — K219 Gastro-esophageal reflux disease without esophagitis: Secondary | ICD-10-CM | POA: Diagnosis not present

## 2019-11-20 DIAGNOSIS — Z1211 Encounter for screening for malignant neoplasm of colon: Secondary | ICD-10-CM | POA: Diagnosis not present

## 2019-11-20 DIAGNOSIS — K5900 Constipation, unspecified: Secondary | ICD-10-CM | POA: Diagnosis not present

## 2019-11-20 MED ORDER — FAMOTIDINE 20 MG PO TABS: 40.0000 mg | ORAL_TABLET | Freq: Two times a day (BID) | ORAL | 11 refills | Status: DC

## 2019-11-20 MED ORDER — DEXILANT 60 MG PO CPDR: 60.0000 mg | DELAYED_RELEASE_CAPSULE | Freq: Every day | ORAL | 3 refills | Status: DC

## 2019-11-20 NOTE — Progress Notes (Signed)
    History of Present Illness: This is a 68 year old female with GERD and constipation who requested a virtual visit.  She relates frequent breakthrough heartburn symptoms occasionally when having a bowel movement as well as other times during the day.  She feels her current regimen with Dexilant and famotidine is working as well as any regimen she has previously used.  She notes mild constipation  that worsened after a course of antibiotics about 2 months ago.  She has been increasing her dietary fiber intake with some success.  She requests a MiraLAX prescription.   Current Medications, Allergies, Past Medical History, Past Surgical History, Family History and Social History were reviewed in [employer] record.   Physical Exam: Telemedicine not performed.    Assessment and Recommendations:  1. GERD. Follow antireflux measures. Continue Dexilant 60 mg daily and famotidine 20 mg twice daily.  Tums as needed. REV in 1 year.   2.  Constipation.  Increase dietary fiber and daily fluid intake.  GlycoLax once daily if above not sufficient.  Call if symptoms not adequately controlled.  3. CRC screening, average risk.  A 10-year interval screening colonoscopy is recommended in October 2022.   These services were provided via telemedicine, audio and video. The patient was at home and the provider was in the office, alone.  We discussed the limitations of evaluation and management by telemedicine and the availability of in person appointments.  Patient consented for this telemedicine visit and is aware of possible charges for this service.  Office CMA participated in this telemedicine service.  Time spent on call: 12 minutes

## 2019-11-20 NOTE — Patient Instructions (Signed)
We have sent the following medications to your pharmacy for you to pick up at your convenience: Dexilant and famotidine.   Increase your fiber and daily fluid intake.   Thank you for choosing me and [facility].  Pricilla Riffle. Dagoberto Ligas., MD., Marval Regal

## 2019-11-23 ENCOUNTER — Other Ambulatory Visit: Payer: Self-pay | Admitting: Internal Medicine

## 2019-11-23 ENCOUNTER — Telehealth: Payer: Self-pay | Admitting: Gastroenterology

## 2019-11-23 NOTE — Telephone Encounter (Signed)
Informed patient that Dr. Fuller Plan stated she can start over the counter Miralax once daily if her increased fiber intake and fluid intake does not work for her constipation. Patient verbalized understanding.

## 2019-11-28 ENCOUNTER — Other Ambulatory Visit: Payer: Self-pay

## 2019-11-28 MED ORDER — ALPRAZOLAM 0.5 MG PO TABS: 0.5000 mg | ORAL_TABLET | Freq: Every day | ORAL | 0 refills | Status: DC | PRN

## 2019-11-28 NOTE — Telephone Encounter (Signed)
Last OV was 09/20/19 Next OV 03/19/20 Last RF 12/15/18

## 2019-12-03 NOTE — Progress Notes (Signed)
This encounter was created in error - please disregard.

## 2019-12-05 ENCOUNTER — Other Ambulatory Visit: Payer: Self-pay | Admitting: Internal Medicine

## 2019-12-05 MED ORDER — NEBIVOLOL HCL 5 MG PO TABS: 5.0000 mg | ORAL_TABLET | Freq: Every day | ORAL | 0 refills | Status: DC

## 2019-12-05 MED ORDER — OLMESARTAN MEDOXOMIL-HCTZ 40-25 MG PO TABS: 1.0000 | ORAL_TABLET | Freq: Every day | ORAL | 0 refills | Status: DC

## 2019-12-05 MED ORDER — MONTELUKAST SODIUM 10 MG PO TABS: 10.0000 mg | ORAL_TABLET | Freq: Every day | ORAL | 0 refills | Status: DC

## 2019-12-05 NOTE — Telephone Encounter (Signed)
Future visit scheduled: yes  Notes to clinic:Script needs to be sent to update pharmacy    Requested Prescriptions  Pending Prescriptions Disp Refills   ALPRAZolam (XANAX) 0.5 MG tablet 90 tablet 0    Sig: Take 1 tablet (0.5 mg total) by mouth daily as needed. for anxiety      Not Delegated - Psychiatry:  Anxiolytics/Hypnotics Failed - 12/05/2019  3:16 PM      Failed - This refill cannot be delegated      Failed - Urine Drug Screen completed in last 360 days.      Passed - Valid encounter within last 6 months    Recent Outpatient Visits           2 months ago Essential hypertension   [place], Claudina Lick, MD   8 months ago Essential hypertension   [place], Claudina Lick, MD   1 year ago Essential hypertension   [facility], Claudina Lick, MD   1 year ago Viral illness   [place], Stacy J, MD   1 year ago Essential hypertension   [place], MD       Future Appointments             In 3 months [place], Claudina Lick, MD [place] at [place] Refills   montelukast (SINGULAIR) 10 MG tablet 90 tablet 0    Sig: Take 1 tablet (10 mg total) by mouth at bedtime.      Pulmonology:  Leukotriene Inhibitors Passed - 12/05/2019  3:16 PM      Passed - Valid encounter within last 12 months    Recent Outpatient Visits           2 months ago Essential hypertension   [place], Claudina Lick, MD   8 months ago Essential hypertension   [place], Claudina Lick, MD   1 year ago Essential hypertension   [facility], Claudina Lick, MD   1 year ago Viral illness   [place], Stacy J, MD   1 year ago Essential hypertension   [place], MD       Future Appointments             In 3 months Burns, Claudina Lick, MD [place] at [facility]              olmesartan-hydrochlorothiazide ([name] HCT) 40-25 MG tablet 90 tablet 0    Sig: Take 1 tablet by mouth daily.      Cardiovascular: ARB + Diuretic Combos Passed - 12/05/2019  3:16 PM      Passed - K in normal range and within 180 days    Potassium  Date Value Ref Range Status  09/20/2019 3.5 3.5 - 5.1 mEq/L Final          Passed - Na in normal range and within 180 days    Sodium  Date Value Ref Range Status  09/20/2019 137 135 - 145 mEq/L Final          Passed - Cr in normal range and within 180 days    Creatinine, Ser  Date Value Ref Range Status  09/20/2019 0.89 0.40 - 1.20 mg/dL Final   Creatinine,U  Date Value Ref Range Status  07/23/2016 88.0 mg/dL Final          Passed - Ca in normal range and within 180 days    Calcium  Date Value Ref Range Status  09/20/2019 9.5 8.4 - 10.5 mg/dL Final   Calcium, Ion  Date Value Ref Range Status  02/26/2010 1.14 1.12 - 1.32 mmol/L Final          Passed - Patient is not pregnant      Passed - Last BP in normal range    BP Readings from Last 1 Encounters:  09/20/19 118/76          Passed - Valid encounter within last 6 months    Recent Outpatient Visits           2 months ago Essential hypertension   [place], Claudina Lick, MD   8 months ago Essential hypertension   [place], Claudina Lick, MD   1 year ago Essential hypertension   [facility], Claudina Lick, MD   1 year ago Viral illness   [place], Claudina Lick, MD   1 year ago Essential hypertension   [place], MD       Future Appointments             In 3 months Burns, Claudina Lick, MD [place] at [facility]              nebivolol  (BYSTOLIC) 5 MG tablet 90 tablet 0    Sig: Take 1 tablet (5 mg total) by mouth daily.      Cardiovascular:  Beta Blockers Passed - 12/05/2019  3:16 PM      Passed - Last BP in normal range    BP Readings from Last 1 Encounters:  09/20/19 118/76          Passed - Last Heart Rate in normal range    Pulse Readings from Last 1 Encounters:  09/20/19 86          Passed - Valid encounter within last 6 months    Recent Outpatient Visits           2 months ago Essential hypertension   [place], Claudina Lick, MD   8 months ago Essential hypertension   [place], Claudina Lick, MD   1 year ago Essential hypertension   [facility] [place], Claudina Lick, MD   1 year ago Viral illness   [place], Claudina Lick, MD   1 year ago Essential hypertension   [place], Stacy J, MD       Future Appointments             In 3 months Burns, Claudina Lick, MD [place] at [facility]

## 2019-12-05 NOTE — Telephone Encounter (Signed)
Medication Refill - Medication: ALPRAZolam (XANAX) 0.5 MG tablet [id number]   olmesartan-hydrochlorothiazide ([name] HCT) 40-25 MG tablet [id number]   montelukast (SINGULAIR) 10 MG tablet [id number]  nebivolol (BYSTOLIC) 5 MG tablet [id number]    Preferred Pharmacy (with phone number or street name):  [place], [place] - [address]  [address] [place] [address]  Phone: [phone] Fax: [phone]    Agent: Please be advised that RX refills may take up to 3 business days. We ask that you follow-up with your pharmacy.

## 2020-01-06 ENCOUNTER — Encounter: Payer: Self-pay | Admitting: Internal Medicine

## 2020-01-11 ENCOUNTER — Telehealth: Payer: Self-pay | Admitting: Internal Medicine

## 2020-01-11 NOTE — Telephone Encounter (Signed)
If you are having cold symptoms she should reschedule

## 2020-01-11 NOTE — Telephone Encounter (Signed)
Patient states she is scheduled to have a COVID vac tomorrow.  She is currently experiencing sniffles (runny nose) and whole body is aching.  She was told to follow up with her PCP to see if she should still get the COVID vac.  Please advise.

## 2020-01-11 NOTE — Telephone Encounter (Signed)
Pt aware of response.  

## 2020-01-31 ENCOUNTER — Ambulatory Visit: Payer: Medicare Other

## 2020-02-04 ENCOUNTER — Other Ambulatory Visit: Payer: Self-pay | Admitting: General Surgery

## 2020-02-04 DIAGNOSIS — J45998 Other asthma: Secondary | ICD-10-CM

## 2020-02-04 DIAGNOSIS — J0100 Acute maxillary sinusitis, unspecified: Secondary | ICD-10-CM

## 2020-02-04 DIAGNOSIS — D860 Sarcoidosis of lung: Secondary | ICD-10-CM

## 2020-02-04 MED ORDER — ALBUTEROL SULFATE (2.5 MG/3ML) 0.083% IN NEBU: INHALATION_SOLUTION | RESPIRATORY_TRACT | 6 refills | Status: DC

## 2020-02-05 ENCOUNTER — Other Ambulatory Visit: Payer: Self-pay | Admitting: Pulmonary Disease

## 2020-02-05 DIAGNOSIS — J0100 Acute maxillary sinusitis, unspecified: Secondary | ICD-10-CM

## 2020-02-05 DIAGNOSIS — J45998 Other asthma: Secondary | ICD-10-CM

## 2020-02-05 DIAGNOSIS — D860 Sarcoidosis of lung: Secondary | ICD-10-CM

## 2020-02-05 MED ORDER — ALBUTEROL SULFATE (2.5 MG/3ML) 0.083% IN NEBU: INHALATION_SOLUTION | RESPIRATORY_TRACT | 6 refills | Status: DC

## 2020-02-07 ENCOUNTER — Ambulatory Visit: Payer: Medicare Other

## 2020-02-07 ENCOUNTER — Other Ambulatory Visit: Payer: Self-pay

## 2020-02-07 DIAGNOSIS — M48061 Spinal stenosis, lumbar region without neurogenic claudication: Secondary | ICD-10-CM

## 2020-02-07 MED ORDER — CYCLOBENZAPRINE HCL 10 MG PO TABS: ORAL_TABLET | ORAL | 0 refills | Status: DC

## 2020-02-16 ENCOUNTER — Ambulatory Visit: Payer: Medicare Other

## 2020-02-18 DIAGNOSIS — M25562 Pain in left knee: Secondary | ICD-10-CM | POA: Diagnosis not present

## 2020-03-07 ENCOUNTER — Ambulatory Visit: Payer: Medicare Other

## 2020-03-13 ENCOUNTER — Ambulatory Visit: Payer: Medicare Other | Attending: Internal Medicine

## 2020-03-13 DIAGNOSIS — Z23 Encounter for immunization: Secondary | ICD-10-CM

## 2020-03-13 NOTE — Progress Notes (Signed)
   Covid-19 Vaccination Clinic  Name:  Cynthia Kirk    MRN: [id number] DOB: 26-Apr-1951  03/13/2020  [name] was observed post Covid-19 immunization for 30 minutes based on pre-vaccination screening without incident. She was provided with Vaccine Information Sheet and instruction to access the V-Safe system.   Ms. Dust was instructed to call 911 with any severe reactions post vaccine: [name] Difficulty breathing  . Swelling of face and throat  . A fast heartbeat  . A bad rash all over body  . Dizziness and weakness   Immunizations Administered    Name Date Dose VIS Date Route   Pfizer COVID-19 Vaccine 03/13/2020 12:19 PM 0.3 mL 11/16/2019 Intramuscular   Manufacturer: [employer]   Lot: [id number]   [place]: [id number]

## 2020-03-17 ENCOUNTER — Ambulatory Visit: Payer: Medicare Other

## 2020-03-19 ENCOUNTER — Ambulatory Visit: Payer: Medicare Other | Admitting: Internal Medicine

## 2020-04-03 ENCOUNTER — Ambulatory Visit: Payer: Medicare Other | Attending: Internal Medicine

## 2020-04-03 ENCOUNTER — Other Ambulatory Visit: Payer: Self-pay | Admitting: Internal Medicine

## 2020-04-03 DIAGNOSIS — Z23 Encounter for immunization: Secondary | ICD-10-CM

## 2020-04-03 NOTE — Telephone Encounter (Signed)
[place] controlled substance database checked.  Ok to fill medication. Last refill was actually 11/2019

## 2020-04-03 NOTE — Progress Notes (Signed)
   Covid-19 Vaccination Clinic  Name:  Cynthia Kirk    MRN: [id number] DOB: [birth year]  04/03/2020  [name] was observed post Covid-19 immunization for 30 minutes based on pre-vaccination screening without incident. She was provided with Vaccine Information Sheet and instruction to access the V-Safe system.   [name] was instructed to call 911 with any severe reactions post vaccine: [name] Difficulty breathing  . Swelling of face and throat  . A fast heartbeat  . A bad rash all over body  . Dizziness and weakness   Immunizations Administered    Name Date Dose VIS Date Route   Pfizer COVID-19 Vaccine 04/03/2020 12:49 PM 0.3 mL 01/30/2019 Intramuscular   Manufacturer: [place]   Lot: [id number]   [place]: [id number]

## 2020-04-03 NOTE — Telephone Encounter (Signed)
Last OV 09/20/19 Next OV 04/09/20  Last RF 02/18/20

## 2020-04-08 ENCOUNTER — Ambulatory Visit: Payer: Medicare Other

## 2020-04-09 ENCOUNTER — Telehealth: Payer: Self-pay | Admitting: Internal Medicine

## 2020-04-09 ENCOUNTER — Ambulatory Visit: Payer: Medicare Other

## 2020-04-09 ENCOUNTER — Ambulatory Visit: Payer: Medicare Other | Admitting: Internal Medicine

## 2020-04-09 DIAGNOSIS — E119 Type 2 diabetes mellitus without complications: Secondary | ICD-10-CM

## 2020-04-09 NOTE — Progress Notes (Signed)
°  Chronic Care Management   Note  04/09/2020 Name: Cynthia Kirk MRN: [id number] DOB: November 21, 1951  Cynthia Kirk is a 69 y.o. year old female who is a primary care patient of Burns, Claudina Lick, MD. I reached out to [name] by phone today in response to a referral sent by Cynthia Kirk PCP, [name], MD.   Cynthia Kirk was given information about Chronic Care Management services today including:  1. CCM service includes personalized support from designated clinical staff supervised by her physician, including individualized plan of care and coordination with other care providers 2. 24/7 contact phone numbers for assistance for urgent and routine care needs. 3. Service will only be billed when office clinical staff spend 20 minutes or more in a month to coordinate care. 4. Only one practitioner may furnish and bill the service in a calendar month. 5. The patient may stop CCM services at any time (effective at the end of the month) by phone call to the office staff.   Patient agreed to services and verbal consent obtained.    This note is not being shared with the patient for the following reason: To respect privacy (The patient or proxy has requested that the information not be shared).  Follow up plan:   Cynthia Kirk [employer] Scheduler

## 2020-04-09 NOTE — Progress Notes (Signed)
Patient has a new home phone number [phone].   This note is not being shared with the patient for the following reason: To respect privacy (The patient or proxy has requested that the information not be shared).     Cynthia Kirk [employer] Scheduler

## 2020-04-15 ENCOUNTER — Ambulatory Visit: Payer: Medicare Other | Admitting: Obstetrics and Gynecology

## 2020-04-28 ENCOUNTER — Other Ambulatory Visit: Payer: Self-pay | Admitting: Internal Medicine

## 2020-04-29 NOTE — Progress Notes (Signed)
Subjective:    Patient ID: Cynthia Kirk, female    DOB: [birth year], 69 y.o.   MRN: [id number]  HPI The patient is here for follow up of their chronic medical problems, including htn, diabetes, GERD, anxiety, lumbar spinal stenosis, b/l knee OA., obesity, anxiety  She is taking all of her medications as prescribed.   She is exercising regularly.     She thinks she has some rosacea - a little redness and pimples.    She is having knee pain from arthritis - she does see ortho.    Medications and allergies reviewed with patient and updated if appropriate.  Patient Active Problem List   Diagnosis Date Noted  . Neck pain on left side 08/03/2018  . Atrophic vaginitis 02/23/2018  . Postmenopausal bleeding 02/23/2018  . Osteopenia 02/23/2018  . Urinary incontinence 02/23/2018  . Right shoulder pain 08/16/2017  . Vasomotor rhinitis 07/26/2017  . Anxiety 02/01/2017  . Renal cyst 07/23/2016  . Cervical disc disorder with radiculopathy of cervical region 03/13/2016  . B12 deficiency   . Cataract 02/25/2014  . Glaucoma suspect 02/25/2014  . Cough 10/13/2013  . Lumbar spinal stenosis 03/07/2013  . Ejection fraction   . Syncope   . Severe obesity (BMI >= 40) ([place]) 09/14/2010  . Essential hypertension 09/14/2010  . GERD 08/22/2009  . Pulmonary sarcoidosis ([place]) 06/30/2009  . Diabetes mellitus ([place]) 06/30/2009  . OSA on CPAP 06/30/2009  . Upper airway cough syndrome 06/30/2009  . Mild intermittent asthma 06/30/2009    Current Outpatient Medications on File Prior to Visit  Medication Sig Dispense Refill  . albuterol (PROVENTIL) (2.5 MG/3ML) 0.083% nebulizer solution TAKE 3ML(2.5MG ) BY NEBULIZATION EVERY 6 HOURS AS NEEDED FOR SHORTNESS OF BREATH 75 mL 6  . ALPRAZolam (XANAX) 0.5 MG tablet TAKE 1 TABLET (0.5 MG TOTAL) BY MOUTH DAILY AS NEEDED FOR ANXIETY 90 tablet 0  . BD PEN NEEDLE NANO U/F 32G X 4 MM MISC USE 3 TIMES DAILY AS DIRECTED 100 each 5  . cyclobenzaprine (FLEXERIL) 10  MG tablet TAKE 1/2 TO 1 TABLET 3 TIMES DAILY AS NEEDED FOR MUSCLE SPASMS. 90 tablet 0  . dexlansoprazole (DEXILANT) 60 MG capsule Take 1 capsule (60 mg total) by mouth daily before breakfast. 90 capsule 3  . famotidine (PEPCID) 20 MG tablet Take 2 tablets (40 mg total) by mouth 2 (two) times daily. 120 tablet 11  . fluticasone (FLOVENT HFA) 44 MCG/ACT inhaler Inhale 2 puffs into the lungs 2 (two) times daily. 3 Inhaler 3  . gabapentin (NEURONTIN) 100 MG capsule TAKE 1 CAPSULE BY MOUTH AT BEDTIME 90 capsule 11  . hydroquinone 4 % cream Apply 1 application topically 2 (two) times daily. 28.35 g 5  . Ibuprofen-Famotidine (DUEXIS) 800-26.6 MG TABS Duexis 800 mg-26.6 mg tablet    . ipratropium (ATROVENT) 0.06 % nasal spray Place 2 sprays into both nostrils 2 (two) times daily.     . montelukast (SINGULAIR) 10 MG tablet Take 1 tablet (10 mg total) by mouth at bedtime. 90 tablet 0  . nebivolol (BYSTOLIC) 5 MG tablet Take 1 tablet (5 mg total) by mouth daily. 90 tablet 0  . olmesartan-hydrochlorothiazide ([name] HCT) 40-25 MG tablet Take 1 tablet by mouth daily. 90 tablet 0  . Probiotic Product (PROBIOTIC & ACIDOPHILUS EX ST) CAPS Take 1 capsule by mouth 2 (two) times daily. (Patient taking differently: Take 1 capsule by mouth daily. ) 14 capsule 0  . triamcinolone ointment (KENALOG) 0.5 % APPLY TO AFFECTED AREA  TWICE A DAY 30 g 0  . VENTOLIN HFA 108 (90 Base) MCG/ACT inhaler INHALE 2 PUFF  BY MOUTH  EVERY 6 HOUR as NEEDED FOR WHEEZING ,SHORTNESS OF BREATH (Patient taking differently: Inhale 2 puffs into the lungs every 6 (six) hours as needed for wheezing or shortness of breath. ) 54 each 2  . VICTOZA 18 MG/3ML SOPN INJECT 0.3 ML SUB Q DAILY 9 mL 0   Current Facility-Administered Medications on File Prior to Visit  Medication Dose Route Frequency Provider Last Rate Last Admin  . triamcinolone acetonide (KENALOG) 10 MG/ML injection 10 mg  10 mg Other Once Landis Martins, DPM        Past Medical  History:  Diagnosis Date  . Abnormal Pap smear 2008  . Adenomatous colon polyp   . Allergic rhinitis, cause unspecified   . Anxiety   . ASCUS with positive high risk HPV   . Blood transfusion 1976   Due to ectopic pregnancy  . Chronic back pain   . Ejection fraction    EF 45-50%, echo, October 27, 2011  . Endometrial mass 2011  . Esophageal reflux   . Fibroid   . H/O Clostridium difficile infection 03/2010  . H/O menorrhagia 2011  . Herpes simplex type 2 infection complicating pregnancy   . Hiatal hernia   . Iron deficiency anemia   . Morbid obesity ([place]) 2010  . Obstructive sleep apnea (adult) (pediatric)    cpap  . Panic attacks   . Plantar fasciitis   . Pneumonia 2005; 2007; 2009; 2011  . Sarcoidosis 1996   @ [employer]  . Simple endometrial hyperplasia 2011  . Spinal stenosis, lumbar    s/p decompression laminectomy 09/2013  . Syncope    ?? Syncope ??  . Type 2 diabetes, diet controlled ([place])   . Unspecified asthma(493.90)   . Urinary incontinence, mixed 2011  . VIN II (vulvar intraepithelial neoplasia II)     Past Surgical History:  Procedure Laterality Date  . COLPOSCOPY VULVA W/ BIOPSY     "I've had maybe 3" (09/05/2013)  . DILATION AND CURETTAGE OF UTERUS  2007  . [place]; ~ 1978  . HYSTEROSCOPY DIAGNOSTIC  2011  . LUMBAR LAMINECTOMY/DECOMPRESSION MICRODISCECTOMY N/A 09/05/2013   Procedure: Thoracic eleven-twelve Thoracic Laminectomy;  Surgeon: Eustace Moore, MD;  Location: [facility];  Service: Neurosurgery;  Laterality: N/A;  Thoracic eleven-twelve Thoracic Laminectomy  . MAXIMUM ACCESS (MAS)POSTERIOR LUMBAR INTERBODY FUSION (PLIF) 1 LEVEL N/A 09/05/2013   Procedure: Lumbar four-five Maximum Access Surgery  Posterior lumbar interbody fusion;  Surgeon: Eustace Moore, MD;  Location: [facility];  Service: Neurosurgery;  Laterality: N/A;  Lumbar four-five Maximum Access Surgery  Posterior lumbar interbody fusion  . POSTERIOR LAMINECTOMY /  DECOMPRESSION LUMBAR SPINE  09/05/2013  . POSTERIOR LUMBAR FUSION  09/05/2013  . THORACIC LAMINECTOMY  09/05/2013  . URETHRAL SLING  2011  . VULVECTOMY  ~ 2007   "for cancerous cells" (09/05/2013)    Social History   Socioeconomic History  . Marital status: Married    Spouse name: Not on file  . Number of children: 2  . Years of education: PHD  . Highest education level: Not on file  Occupational History  . Occupation: retired    Fish farm manager: UNEMPLOYED  Tobacco Use  . Smoking status: Never Smoker  . Smokeless tobacco: Never Used  Substance and Sexual Activity  . Alcohol use: Yes    Comment: 2018 - very occasionally  .  Drug use: No  . Sexual activity: Yes    Birth control/protection: Post-menopausal  Other Topics Concern  . Not on file  Social History Narrative   Pt lives at home with her spouse.   She does not use caffeine.   Social Determinants of Health   Financial Resource Strain:   . Difficulty of Paying Living Expenses:   Food Insecurity:   . Worried About Charity fundraiser in the Last Year:   . Arboriculturist in the Last Year:   Transportation Needs:   . Film/video editor (Medical):   [name] Lack of Transportation (Non-Medical):   Physical Activity:   . Days of Exercise per Week:   . Minutes of Exercise per Session:   Stress:   . Feeling of Stress :   Social Connections:   . Frequency of Communication with Friends and Family:   . Frequency of Social Gatherings with Friends and Family:   . Attends Religious Services:   . Active Member of Clubs or Organizations:   . Attends Archivist Meetings:   [name] Marital Status:     Family History  Problem Relation Age of Onset  . Heart attack Father   . Hypertension Father   . Diabetes Father   . Seizures Father   . Migraines Father   . Kidney disease Sister   . Osteoporosis Sister   . Heart attack Sister   . Cancer Paternal Uncle   . Asthma Daughter   . Asthma Son   . Osteoporosis Sister   . Lung  cancer Maternal Uncle   . Kidney disease Other   . Diabetes Other   . High Cholesterol Brother        x2    Review of Systems  Constitutional: Negative for chills and fever.  Respiratory: Negative for cough and shortness of breath.   Cardiovascular: Negative for chest pain, palpitations and leg swelling.  Musculoskeletal: Positive for arthralgias.  Skin: Positive for color change (white spots in skin, redness/pimples in cheeks).  Neurological: Positive for numbness (sometimes tingling in fingers - when fingers lock up). Negative for light-headedness and headaches.       Objective:   Vitals:   04/30/20 1348  BP: 126/68  Pulse: (!) 56  Resp: 16  Temp: 98.3 F (36.8 C)  SpO2: 97%   BP Readings from Last 3 Encounters:  04/30/20 126/68  09/20/19 118/76  09/19/18 118/68   Wt Readings from Last 3 Encounters:  04/30/20 247 lb 12.8 oz (112.4 kg)  11/20/19 218 lb (98.9 kg)  09/20/19 218 lb 1.3 oz (98.9 kg)   Body mass index is 43.9 kg/m.   Physical Exam    Constitutional: Appears well-developed and well-nourished. No distress.  HENT:  Head: Normocephalic and atraumatic.  Neck: Neck supple. No tracheal deviation present. No thyromegaly present.  No cervical lymphadenopathy Cardiovascular: Normal rate, regular rhythm and normal heart sounds.   No murmur heard. No carotid bruit .  No edema Pulmonary/Chest: Effort normal and breath sounds normal. No respiratory distress. No has no wheezes. No rales.  Skin: Skin is warm and dry. Not diaphoretic.  Psychiatric: Normal mood and affect. Behavior is normal.      Assessment & Plan:    See Problem List for Assessment and Plan of chronic medical problems.    This visit occurred during the SARS-CoV-2 public health emergency.  Safety protocols were in place, including screening questions prior to the visit, additional usage of staff PPE,  and extensive cleaning of exam room while observing appropriate contact time as indicated for  disinfecting solutions.

## 2020-04-29 NOTE — Patient Instructions (Addendum)
  Blood work was ordered.     Medications reviewed and updated.  Changes include : metrogel    Your prescription(s) have been submitted to your pharmacy. Please take as directed and contact our office if you believe you are having problem(s) with the medication(s).  A referral was ordered for Dermatology and nutrition.      Someone will call you to schedule this.    Please followup in 6 months

## 2020-04-30 ENCOUNTER — Other Ambulatory Visit: Payer: Self-pay

## 2020-04-30 ENCOUNTER — Encounter: Payer: Self-pay | Admitting: Internal Medicine

## 2020-04-30 ENCOUNTER — Ambulatory Visit (INDEPENDENT_AMBULATORY_CARE_PROVIDER_SITE_OTHER): Payer: Medicare Other | Admitting: Internal Medicine

## 2020-04-30 VITALS — BP 126/68 | HR 56 | Temp 98.3°F | Resp 16 | Ht 63.0 in | Wt 247.8 lb

## 2020-04-30 DIAGNOSIS — M170 Bilateral primary osteoarthritis of knee: Secondary | ICD-10-CM | POA: Diagnosis not present

## 2020-04-30 DIAGNOSIS — I10 Essential (primary) hypertension: Secondary | ICD-10-CM

## 2020-04-30 DIAGNOSIS — L989 Disorder of the skin and subcutaneous tissue, unspecified: Secondary | ICD-10-CM

## 2020-04-30 DIAGNOSIS — K219 Gastro-esophageal reflux disease without esophagitis: Secondary | ICD-10-CM

## 2020-04-30 DIAGNOSIS — E119 Type 2 diabetes mellitus without complications: Secondary | ICD-10-CM | POA: Diagnosis not present

## 2020-04-30 DIAGNOSIS — M48061 Spinal stenosis, lumbar region without neurogenic claudication: Secondary | ICD-10-CM | POA: Diagnosis not present

## 2020-04-30 DIAGNOSIS — L719 Rosacea, unspecified: Secondary | ICD-10-CM | POA: Diagnosis not present

## 2020-04-30 DIAGNOSIS — F419 Anxiety disorder, unspecified: Secondary | ICD-10-CM | POA: Diagnosis not present

## 2020-04-30 LAB — COMPREHENSIVE METABOLIC PANEL
ALT: 19 U/L (ref 0–35)
AST: 22 U/L (ref 0–37)
Albumin: 3.8 g/dL (ref 3.5–5.2)
Alkaline Phosphatase: 135 U/L — ABNORMAL HIGH (ref 39–117)
BUN: 21 mg/dL (ref 6–23)
CO2: 31 mEq/L (ref 19–32)
Calcium: 9.1 mg/dL (ref 8.4–10.5)
Chloride: 101 mEq/L (ref 96–112)
Creatinine, Ser: 0.89 mg/dL (ref 0.40–1.20)
GFR: 62.85 mL/min (ref >60.00)
Glucose, Bld: 82 mg/dL (ref 70–99)
Potassium: 3.9 mEq/L (ref 3.5–5.1)
Sodium: 140 mEq/L (ref 135–145)
Total Bilirubin: 0.5 mg/dL (ref 0.2–1.2)
Total Protein: 6.8 g/dL (ref 6.0–8.3)

## 2020-04-30 LAB — LIPID PANEL
Cholesterol: 190 mg/dL (ref 0–200)
HDL: 60.9 mg/dL (ref >39.00)
LDL Cholesterol: 111 mg/dL — ABNORMAL HIGH (ref 0–99)
NonHDL: 129.31
Total CHOL/HDL Ratio: 3
Triglycerides: 90 mg/dL (ref 0.0–149.0)
VLDL: 18 mg/dL (ref 0.0–40.0)

## 2020-04-30 LAB — HEMOGLOBIN A1C: Hgb A1c MFr Bld: 5.2 % (ref 4.6–6.5)

## 2020-04-30 MED ORDER — NEBIVOLOL HCL 5 MG PO TABS: 5.0000 mg | ORAL_TABLET | Freq: Every day | ORAL | 1 refills | Status: DC

## 2020-04-30 MED ORDER — DEXILANT 60 MG PO CPDR: 60.0000 mg | DELAYED_RELEASE_CAPSULE | Freq: Every day | ORAL | 1 refills | Status: DC

## 2020-04-30 MED ORDER — METRONIDAZOLE 0.75 % EX LOTN: 1.0000 "application " | TOPICAL_LOTION | Freq: Every day | CUTANEOUS | 5 refills | Status: DC

## 2020-04-30 MED ORDER — OLMESARTAN MEDOXOMIL-HCTZ 40-25 MG PO TABS: 1.0000 | ORAL_TABLET | Freq: Every day | ORAL | 1 refills | Status: DC

## 2020-04-30 NOTE — Assessment & Plan Note (Signed)
Chronic On victoza Ha gained weight since she was here last Check a1c Low sugar / carb diet Stressed regular exercise Encouraged weight loss

## 2020-04-30 NOTE — Assessment & Plan Note (Addendum)
Chronic Has gained weight She is exercising - continue Decrease carbs/sugars and portions Encouraged weight loss

## 2020-04-30 NOTE — Assessment & Plan Note (Signed)
Chronic Controlled, stable Continue current dose of medication Xanax prn

## 2020-04-30 NOTE — Assessment & Plan Note (Signed)
Chronic Taking gabapentin nightly and flexeril prn continue

## 2020-04-30 NOTE — Assessment & Plan Note (Signed)
Following with ortho 

## 2020-04-30 NOTE — Assessment & Plan Note (Signed)
Acute Redness, pimples on cheeks c/w roacea Trial of metrogel  Referred to derm

## 2020-04-30 NOTE — Assessment & Plan Note (Addendum)
Chronic GERD controlled Continue daily medication dexilant daily prn pepcid twice 1-2 daily

## 2020-04-30 NOTE — Assessment & Plan Note (Signed)
Chronic BP well controlled Current regimen effective and well tolerated Continue current medications at current doses cmp  

## 2020-05-12 NOTE — Progress Notes (Signed)
Virtual Visit via Video Note  I connected with Cynthia Kirk on 05/13/20 at  8:00 AM EDT by a video enabled telemedicine application and verified that I am speaking with the correct person using two identifiers.   I discussed the limitations of evaluation and management by telemedicine and the availability of in person appointments. The patient expressed understanding and agreed to proceed.  Present for the visit:  Myself, Dr Billey Gosling, Cynthia Kirk.  The patient is currently at home and I am in the office.    No referring provider.    History of Present Illness: This is an acute visit for fever, bodyache  Her symptoms started just over one week ago.  She had chills and was cold, fever, which lasted a couple of days.  Her body ached, had a headache and low energy.  She was nauseous and had decreased appetite. He did have diarrhea and abdominal cramping.    Her husband was not sick.    She no longer has diarrhea.  At this point she only has the body aches.  She thinks this may be from her mattress.    Review of Systems  Constitutional: Positive for chills and fever.       Dec appetite  HENT: Positive for sore throat (mild). Negative for congestion, ear pain and sinus pain.   Respiratory: Negative for cough, shortness of breath and wheezing.   Gastrointestinal: Positive for abdominal pain (cramping with diarrhea), diarrhea and nausea.  Musculoskeletal: Positive for myalgias.  Neurological: Positive for headaches.      Social History   Socioeconomic History  . Marital status: Married    Spouse name: Not on file  . Number of children: 2  . Years of education: PHD  . Highest education level: Not on file  Occupational History  . Occupation: retired    Fish farm manager: UNEMPLOYED  Tobacco Use  . Smoking status: Never Smoker  . Smokeless tobacco: Never Used  Substance and Sexual Activity  . Alcohol use: Yes    Comment: 2018 - very occasionally  . Drug use: No  . Sexual activity:  Yes    Birth control/protection: Post-menopausal  Other Topics Concern  . Not on file  Social History Narrative   Pt lives at home with her spouse.   She does not use caffeine.   Social Determinants of Health   Financial Resource Strain:   . Difficulty of Paying Living Expenses:   Food Insecurity:   . Worried About Charity fundraiser in the Last Year:   . Arboriculturist in the Last Year:   Transportation Needs:   . Film/video editor (Medical):   [name] Lack of Transportation (Non-Medical):   Physical Activity:   . Days of Exercise per Week:   . Minutes of Exercise per Session:   Stress:   . Feeling of Stress :   Social Connections:   . Frequency of Communication with Friends and Family:   . Frequency of Social Gatherings with Friends and Family:   . Attends Religious Services:   . Active Member of Clubs or Organizations:   . Attends Archivist Meetings:   [name] Marital Status:      Observations/Objective: Appears well in NAD breathing normally Skin appears warm and dry  Assessment and Plan:  See Problem List for Assessment and Plan of chronic medical problems.   Follow Up Instructions:    I discussed the assessment and treatment plan with the patient. The patient was  provided an opportunity to ask questions and all were answered. The patient agreed with the plan and demonstrated an understanding of the instructions.   The patient was advised to call back or seek an in-person evaluation if the symptoms worsen or if the condition fails to improve as anticipated.    [name], MD

## 2020-05-13 ENCOUNTER — Telehealth (INDEPENDENT_AMBULATORY_CARE_PROVIDER_SITE_OTHER): Payer: Medicare Other | Admitting: Internal Medicine

## 2020-05-13 ENCOUNTER — Encounter: Payer: Self-pay | Admitting: Internal Medicine

## 2020-05-13 DIAGNOSIS — K529 Noninfective gastroenteritis and colitis, unspecified: Secondary | ICD-10-CM

## 2020-05-13 MED ORDER — ONDANSETRON 4 MG PO TBDP
4.0000 mg | ORAL_TABLET | Freq: Three times a day (TID) | ORAL | 0 refills | Status: DC | PRN
Start: 2020-05-13 — End: 2020-10-13

## 2020-05-13 NOTE — Assessment & Plan Note (Signed)
Acute Symptoms improved, still some decreased appetite, nausea Likely viral - possibly norovirus Will prescribe zofran for nausea Continue increased fluids Bland diet - advance as tolerated No further evaluation needed Call if symptoms do not continue to improve

## 2020-05-14 ENCOUNTER — Ambulatory Visit: Payer: Medicare Other

## 2020-05-22 ENCOUNTER — Ambulatory Visit (INDEPENDENT_AMBULATORY_CARE_PROVIDER_SITE_OTHER): Payer: Medicare Other

## 2020-05-22 DIAGNOSIS — Z0000 Encounter for general adult medical examination without abnormal findings: Secondary | ICD-10-CM

## 2020-05-22 NOTE — Progress Notes (Signed)
I connected with Cynthia Kirk today by telephone and verified that I am speaking with the correct person using two identifiers. Location patient: home Location provider: work Persons participating in the virtual visit: Alanie Syler and Lisette Abu, LPN  I discussed the limitations, risks, security and privacy concerns of performing an evaluation and management service by telephone and the availability of in person appointments. I also discussed with the patient that there may be a patient responsible charge related to this service. The patient expressed understanding and verbally consented to this telephonic visit.    Interactive audio and video telecommunications were attempted between this provider and patient, however failed, due to patient having technical difficulties OR patient did not have access to video capability.  We continued and completed visit with audio only.  Some vital signs may be absent or patient reported.   Time Spent with patient on telephone encounter: 25 minutes  Subjective:   Cynthia Kirk is a 69 y.o. female who presents for Medicare Annual (Subsequent) preventive examination.  Review of Systems:  No ROS. Medicare Wellness Virtual Visit Cardiac Risk Factors include: advanced age (>55men, >18 women);diabetes mellitus;dyslipidemia;family history of premature cardiovascular disease;hypertension;obesity (BMI >30kg/m2)     Objective:     Vitals: LMP 02/14/2014   There is no height or weight on file to calculate BMI.  Advanced Directives 05/22/2020 09/19/2018 02/21/2018 08/05/2017 04/06/2017 03/30/2016 03/01/2016  Does Patient Have a Medical Advance Directive? Yes Yes Yes No Yes No No  Type of Advance Directive Living will;Healthcare Power of [place];Living will [place];Living will - [place];Living will - -  Does patient want to make changes to medical advance directive? No - Patient declined  - No - Patient declined - - - -  Copy of [place] in Chart? No - copy requested No - copy requested No - copy requested - No - copy requested - -  Would patient like information on creating a medical advance directive? - - - - - No - patient declined information No - patient declined information  Pre-existing out of facility DNR order (yellow form or pink MOST form) - - - - - - -    Tobacco Social History   Tobacco Use  Smoking Status Never Smoker  Smokeless Tobacco Never Used     Counseling given: Not Answered   Clinical Intake:  Pre-visit preparation completed: Yes  Pain : No/denies pain     Nutritional Risks: Nausea/ vomitting/ diarrhea Diabetes: Yes CBG done?: No Did pt. bring in CBG monitor from home?: No  How often do you need to have someone help you when you read instructions, pamphlets, or other written materials from your doctor or pharmacy?: 1 - Never What is the last grade level you completed in school?: Post Graduate  Interpreter Needed?: No  Information entered by :: Oceanna Arruda N. Lowell Guitar, LPN  Past Medical History:  Diagnosis Date  . Abnormal Pap smear 2008  . Adenomatous colon polyp   . Allergic rhinitis, cause unspecified   . Anxiety   . ASCUS with positive high risk HPV   . Blood transfusion 1976   Due to ectopic pregnancy  . Chronic back pain   . Ejection fraction    EF 45-50%, echo, October 27, 2011  . Endometrial mass 2011  . Esophageal reflux   . Fibroid   . H/O Clostridium difficile infection 03/2010  . H/O menorrhagia 2011  . Herpes simplex type  2 infection complicating pregnancy   . Hiatal hernia   . Iron deficiency anemia   . Morbid obesity ([place]) 2010  . Obstructive sleep apnea (adult) (pediatric)    cpap  . Panic attacks   . Plantar fasciitis   . Pneumonia 2005; 2007; 2009; 2011  . Sarcoidosis 1996   @ [employer]  . Simple endometrial hyperplasia 2011  . Spinal stenosis, lumbar    s/p decompression laminectomy  09/2013  . Syncope    ?? Syncope ??  . Type 2 diabetes, diet controlled ([place])   . Unspecified asthma(493.90)   . Urinary incontinence, mixed 2011  . VIN II (vulvar intraepithelial neoplasia II)    Past Surgical History:  Procedure Laterality Date  . COLPOSCOPY VULVA W/ BIOPSY     "I've had maybe 3" (09/05/2013)  . DILATION AND CURETTAGE OF UTERUS  2007  . [place]; ~ 1978  . HYSTEROSCOPY DIAGNOSTIC  2011  . LUMBAR LAMINECTOMY/DECOMPRESSION MICRODISCECTOMY N/A 09/05/2013   Procedure: Thoracic eleven-twelve Thoracic Laminectomy;  Surgeon: Eustace Moore, MD;  Location: [facility];  Service: Neurosurgery;  Laterality: N/A;  Thoracic eleven-twelve Thoracic Laminectomy  . MAXIMUM ACCESS (MAS)POSTERIOR LUMBAR INTERBODY FUSION (PLIF) 1 LEVEL N/A 09/05/2013   Procedure: Lumbar four-five Maximum Access Surgery  Posterior lumbar interbody fusion;  Surgeon: Eustace Moore, MD;  Location: [facility];  Service: Neurosurgery;  Laterality: N/A;  Lumbar four-five Maximum Access Surgery  Posterior lumbar interbody fusion  . POSTERIOR LAMINECTOMY / DECOMPRESSION LUMBAR SPINE  09/05/2013  . POSTERIOR LUMBAR FUSION  09/05/2013  . THORACIC LAMINECTOMY  09/05/2013  . URETHRAL SLING  2011  . VULVECTOMY  ~ 2007   "for cancerous cells" (09/05/2013)   Family History  Problem Relation Age of Onset  . Heart attack Father   . Hypertension Father   . Diabetes Father   . Seizures Father   . Migraines Father   . Kidney disease Sister   . Osteoporosis Sister   . Heart attack Sister   . Cancer Paternal Uncle   . Asthma Daughter   . Asthma Son   . Osteoporosis Sister   . Lung cancer Maternal Uncle   . Kidney disease Other   . Diabetes Other   . High Cholesterol Brother        x2   Social History   Socioeconomic History  . Marital status: Married    Spouse name: Not on file  . Number of children: 2  . Years of education: PHD  . Highest education level: Not on file  Occupational  History  . Occupation: retired    Fish farm manager: UNEMPLOYED  Tobacco Use  . Smoking status: Never Smoker  . Smokeless tobacco: Never Used  Vaping Use  . Vaping Use: Never used  Substance and Sexual Activity  . Alcohol use: Yes    Comment: 2018 - very occasionally  . Drug use: No  . Sexual activity: Yes    Birth control/protection: Post-menopausal  Other Topics Concern  . Not on file  Social History Narrative   Pt lives at home with her spouse.   She does not use caffeine.   Social Determinants of Health   Financial Resource Strain:   . Difficulty of Paying Living Expenses:   Food Insecurity:   . Worried About Charity fundraiser in the Last Year:   . Arboriculturist in the Last Year:   Transportation Needs:   . Film/video editor (Medical):   [name]  Lack of Transportation (Non-Medical):   Physical Activity:   . Days of Exercise per Week:   . Minutes of Exercise per Session:   Stress:   . Feeling of Stress :   Social Connections:   . Frequency of Communication with Friends and Family:   . Frequency of Social Gatherings with Friends and Family:   . Attends Religious Services:   . Active Member of Clubs or Organizations:   . Attends Archivist Meetings:   [name] Marital Status:     Outpatient Encounter Medications as of 05/22/2020  Medication Sig  . albuterol (PROVENTIL) (2.5 MG/3ML) 0.083% nebulizer solution TAKE 3ML(2.5MG ) BY NEBULIZATION EVERY 6 HOURS AS NEEDED FOR SHORTNESS OF BREATH  . ALPRAZolam (XANAX) 0.5 MG tablet TAKE 1 TABLET (0.5 MG TOTAL) BY MOUTH DAILY AS NEEDED FOR ANXIETY  . BD PEN NEEDLE NANO U/F 32G X 4 MM MISC USE 3 TIMES DAILY AS DIRECTED  . cyclobenzaprine (FLEXERIL) 10 MG tablet TAKE 1/2 TO 1 TABLET 3 TIMES DAILY AS NEEDED FOR MUSCLE SPASMS.  [name] dexlansoprazole (DEXILANT) 60 MG capsule Take 1 capsule (60 mg total) by mouth daily before breakfast.  . famotidine (PEPCID) 20 MG tablet Take 2 tablets (40 mg total) by mouth 2 (two) times daily.  .  fluticasone (FLOVENT HFA) 44 MCG/ACT inhaler Inhale 2 puffs into the lungs 2 (two) times daily.  [name] gabapentin (NEURONTIN) 100 MG capsule TAKE 1 CAPSULE BY MOUTH AT BEDTIME  . hydroquinone 4 % cream Apply 1 application topically 2 (two) times daily.  . Ibuprofen-Famotidine (DUEXIS) 800-26.6 MG TABS Duexis 800 mg-26.6 mg tablet  . ipratropium (ATROVENT) 0.06 % nasal spray Place 2 sprays into both nostrils 2 (two) times daily.   [name] METRONIDAZOLE, TOPICAL, 0.75 % LOTN Apply 1 application topically daily.  . montelukast (SINGULAIR) 10 MG tablet Take 1 tablet (10 mg total) by mouth at bedtime.  . nebivolol (BYSTOLIC) 5 MG tablet Take 1 tablet (5 mg total) by mouth daily.  [name] olmesartan-hydrochlorothiazide ([name] HCT) 40-25 MG tablet Take 1 tablet by mouth daily.  . ondansetron (ZOFRAN ODT) 4 MG disintegrating tablet Take 1 tablet (4 mg total) by mouth every 8 (eight) hours as needed for nausea or vomiting.  . Probiotic Product (PROBIOTIC & ACIDOPHILUS EX ST) CAPS Take 1 capsule by mouth 2 (two) times daily. (Patient taking differently: Take 1 capsule by mouth daily. )  . triamcinolone ointment (KENALOG) 0.5 % APPLY TO AFFECTED AREA TWICE A DAY  . VENTOLIN HFA 108 (90 Base) MCG/ACT inhaler INHALE 2 PUFF  BY MOUTH  EVERY 6 HOUR as NEEDED FOR WHEEZING ,SHORTNESS OF BREATH (Patient taking differently: Inhale 2 puffs into the lungs every 6 (six) hours as needed for wheezing or shortness of breath. )  . VICTOZA 18 MG/3ML SOPN INJECT 0.3 ML SUB Q DAILY   Facility-Administered Encounter Medications as of 05/22/2020  Medication  . triamcinolone acetonide (KENALOG) 10 MG/ML injection 10 mg    Activities of Daily Living In your present state of health, do you have any difficulty performing the following activities: 05/22/2020  Hearing? N  Vision? N  Difficulty concentrating or making decisions? N  Walking or climbing stairs? N  Dressing or bathing? N  Doing errands, shopping? N  Preparing Food and eating ?  N  Using the Toilet? N  In the past six months, have you accidently leaked urine? N  Do you have problems with loss of bowel control? Y  Managing your Medications? N  Managing your  Finances? N  Housekeeping or managing your Housekeeping? N  Some recent data might be hidden    Patient Care Team: [name], MD as PCP - General (Internal Medicine) Suella Broad, MD as Consulting Physician (Physical Medicine and Rehabilitation) Chesley Mires, MD as Consulting Physician (Pulmonary Disease) Ladene Artist, MD as Consulting Physician (Gastroenterology) Melida Quitter, MD as Consulting Physician (Otolaryngology) Everett Graff, MD as Consulting Physician (Obstetrics and Gynecology) Eustace Moore, MD (Neurosurgery) Fay Records, MD as Consulting Physician (Cardiology) Charlton Haws, [facility] as Pharmacist (Pharmacist)    Assessment:   This is a routine wellness examination for Cynthia Kirk.  Exercise Activities and Dietary recommendations Current Exercise Habits: Home exercise routine, Type of exercise: walking;stretching;strength training/weights, Time (Minutes): 30, Frequency (Times/Week): 5, Weekly Exercise (Minutes/Week): 150, Intensity: Moderate, Exercise limited by: orthopedic condition(s);psychological condition(s);respiratory conditions(s)  Goals    .  Increase how much I am walking per week      Start to walk at the gym 3 times a week.    .  Patient Stated      I want to travel abroad as much as possible.     .  Patient Stated (pt-stated)      To lose weight and get cholesterol within normal range.    .  Weight < 180 lb (81.647 kg)      On diabetic is helping Belong to the weight watchers; online        Fall Risk Fall Risk  05/22/2020 04/30/2020 10/31/2019 09/19/2018 07/07/2018  Falls in the past year? 0 0 0 Yes Yes  Comment - - Emmi Telephone Survey: data to providers prior to load - Emmi Telephone Survey: data to providers prior to load  Number falls in past yr: 0  0 - 1 1  Comment - - - - Emmi Telephone Survey Actual Response = 1  Injury with Fall? 0 0 - No No  Risk for fall due to : No Fall Risks - - - -  Follow up Falls evaluation completed Falls evaluation completed - Falls prevention discussed -   Is the patient's home free of loose throw rugs in walkways, pet beds, electrical cords, etc?   yes      Grab bars in the bathroom? yes      Handrails on the stairs?   yes      Adequate lighting?   yes  Timed Get Up and Go performed: not indicated  Depression Screen PHQ 2/9 Scores 04/30/2020 09/19/2018 04/06/2017 03/30/2016  PHQ - 2 Score 0 0 0 0     Cognitive Function: not indicated; patient is cogitatively intact.        Immunization History  Administered Date(s) Administered  . Fluad Quad(high Dose 65+) 09/20/2019  . Influenza Split 10/06/2011  . Influenza Whole 10/14/2009, 09/14/2010  . Influenza, High Dose Seasonal PF 08/19/2016, 08/16/2017  . Influenza, Seasonal, Injecte, Preservative Fre 01/29/2013  . Influenza,inj,Quad PF,6+ Mos 08/16/2013, 09/09/2014, 01/02/2016  . Influenza,inj,quad, With Preservative 08/06/2017  . PFIZER SARS-COV-2 Vaccination 03/13/2020, 04/03/2020  . Pneumococcal Conjugate-13 11/15/2014  . Pneumococcal Polysaccharide-23 03/06/2008, 08/19/2016  . Td 12/06/2004  . Tdap 07/09/2015    Qualifies for Shingles Vaccine? Yes; patient declined  Screening Tests Health Maintenance  Topic Date Due  . MAMMOGRAM  04/01/2016  . FOOT EXAM  02/24/2019  . OPHTHALMOLOGY EXAM  07/06/2019  . INFLUENZA VACCINE  07/06/2020  . HEMOGLOBIN A1C  10/31/2020  . DEXA SCAN  08/31/2021  . COLONOSCOPY  09/29/2021  .  TETANUS/TDAP  07/08/2025  . COVID-19 Vaccine  Completed  . Hepatitis C Screening  Completed  . PNA vac Low Risk Adult  Completed    Cancer Screenings: Lung: Low Dose CT Chest recommended if Age 9-80 years, 30 pack-year currently smoking OR have quit w/in 15years. Patient does not qualify. Breast:  Up to date on  Mammogram? No   Up to date of Bone Density/Dexa? No Colorectal: Yes  Additional Screenings: Hepatitis C Screening: completed     Plan:     Reviewed health maintenance screenings with patient today and relevant education, vaccines, and/or referrals were provided.    Continue doing brain stimulating activities (puzzles, reading, adult coloring books, staying active) to keep memory sharp.    Continue to eat heart healthy diet (full of fruits, vegetables, whole grains, lean protein, water--limit salt, fat, and sugar intake) and increase physical activity as tolerated.   I have personally reviewed and noted the following in the patient's chart:   . Medical and social history . Use of alcohol, tobacco or illicit drugs  . Current medications and supplements . Functional ability and status . Nutritional status . Physical activity . Advanced directives . List of other physicians . Hospitalizations, surgeries, and ER visits in previous 12 months . Vitals . Screenings to include cognitive, depression, and falls . Referrals and appointments  In addition, I have reviewed and discussed with patient certain preventive protocols, quality metrics, and best practice recommendations. A written personalized care plan for preventive services as well as general preventive health recommendations were provided to patient.     Sheral Flow, LPN  [id number]  Nurse Health Advisor  Nurse Notes: There were no vitals filed for this visit. There is no height or weight on file to calculate BMI.

## 2020-05-22 NOTE — Patient Instructions (Signed)
Cynthia Kirk , Thank you for taking time to come for your Medicare Wellness Visit. I appreciate your ongoing commitment to your health goals. Please review the following plan we discussed and let me know if I can assist you in the future.   Screening recommendations/referrals: Colonoscopy: last done 09/30/2011; due 09/2021 Mammogram: last done 04/01/2014; due every year; has been scheduled for 2021 per patient Bone Density: last done 08/31/2016; due every 2-3 years; due 2021 Recommended yearly ophthalmology/optometry visit for glaucoma screening and checkup Recommended yearly dental visit for hygiene and checkup  Vaccinations: Influenza vaccine: 09/20/2019 Pneumococcal vaccine: completed Tdap vaccine: 07/09/2015 Shingles vaccine: declined   Covid-19: completed  Advanced directives: Please bring a copy of your health care power of attorney and living will to the office at your convenience.  Conditions/risks identified: Please continue to do your personal lifestyle choices by: daily care of teeth and gums, regular physical activity (goal should be 5 days a week for 30 minutes), eat a healthy diet, avoid tobacco and drug use, limiting any alcohol intake, taking a low-dose aspirin (if not allergic or have been advised by your provider otherwise) and taking vitamins and minerals as recommended by your provider.  Next appointment: Please schedule your next Medicare Wellness Visit with your Nurse Health Advisor in 1 year.   Preventive Care 79 Years and Older, Female Preventive care refers to lifestyle choices and visits with your health care provider that can promote health and wellness. What does preventive care include?  A yearly physical exam. This is also called an annual well check.  Dental exams once or twice a year.  Routine eye exams. Ask your health care provider how often you should have your eyes checked.  Personal lifestyle choices, including:  Daily care of your teeth and  gums.  Regular physical activity.  Eating a healthy diet.  Avoiding tobacco and drug use.  Limiting alcohol use.  Practicing safe sex.  Taking low-dose aspirin every day.  Taking vitamin and mineral supplements as recommended by your health care provider. What happens during an annual well check? The services and screenings done by your health care provider during your annual well check will depend on your age, overall health, lifestyle risk factors, and family history of disease. Counseling  Your health care provider may ask you questions about your:  Alcohol use.  Tobacco use.  Drug use.  Emotional well-being.  Home and relationship well-being.  Sexual activity.  Eating habits.  History of falls.  Memory and ability to understand (cognition).  Work and work Statistician.  Reproductive health. Screening  You may have the following tests or measurements:  Height, weight, and BMI.  Blood pressure.  Lipid and cholesterol levels. These may be checked every 5 years, or more frequently if you are over 29 years old.  Skin check.  Lung cancer screening. You may have this screening every year starting at age 94 if you have a 30-pack-year history of smoking and currently smoke or have quit within the past 15 years.  Fecal occult blood test (FOBT) of the stool. You may have this test every year starting at age 37.  Flexible sigmoidoscopy or colonoscopy. You may have a sigmoidoscopy every 5 years or a colonoscopy every 10 years starting at age 42.  Hepatitis C blood test.  Hepatitis B blood test.  Sexually transmitted disease (STD) testing.  Diabetes screening. This is done by checking your blood sugar (glucose) after you have not eaten for a while (fasting). You may  have this done every 1-3 years.  Bone density scan. This is done to screen for osteoporosis. You may have this done starting at age 73.  Mammogram. This may be done every 1-2 years. Talk to your  health care provider about how often you should have regular mammograms. Talk with your health care provider about your test results, treatment options, and if necessary, the need for more tests. Vaccines  Your health care provider may recommend certain vaccines, such as:  Influenza vaccine. This is recommended every year.  Tetanus, diphtheria, and acellular pertussis (Tdap, Td) vaccine. You may need a Td booster every 10 years.  Zoster vaccine. You may need this after age 36.  Pneumococcal 13-valent conjugate (PCV13) vaccine. One dose is recommended after age 58.  Pneumococcal polysaccharide (PPSV23) vaccine. One dose is recommended after age 66. Talk to your health care provider about which screenings and vaccines you need and how often you need them. This information is not intended to replace advice given to you by your health care provider. Make sure you discuss any questions you have with your health care provider. Document Released: 12/19/2015 Document Revised: 08/11/2016 Document Reviewed: 09/23/2015 Elsevier Interactive Patient Education  2017 [place] Prevention in the Home Falls can cause injuries. They can happen to people of all ages. There are many things you can do to make your home safe and to help prevent falls. What can I do on the outside of my home?  Regularly fix the edges of walkways and driveways and fix any cracks.  Remove anything that might make you trip as you walk through a door, such as a raised step or threshold.  Trim any bushes or trees on the path to your home.  Use bright outdoor lighting.  Clear any walking paths of anything that might make someone trip, such as rocks or tools.  Regularly check to see if handrails are loose or broken. Make sure that both sides of any steps have handrails.  Any raised decks and porches should have guardrails on the edges.  Have any leaves, snow, or ice cleared regularly.  Use sand or salt on walking  paths during winter.  Clean up any spills in your garage right away. This includes oil or grease spills. What can I do in the bathroom?  Use night lights.  Install grab bars by the toilet and in the tub and shower. Do not use towel bars as grab bars.  Use non-skid mats or decals in the tub or shower.  If you need to sit down in the shower, use a plastic, non-slip stool.  Keep the floor dry. Clean up any water that spills on the floor as soon as it happens.  Remove soap buildup in the tub or shower regularly.  Attach bath mats securely with double-sided non-slip rug tape.  Do not have throw rugs and other things on the floor that can make you trip. What can I do in the bedroom?  Use night lights.  Make sure that you have a light by your bed that is easy to reach.  Do not use any sheets or blankets that are too big for your bed. They should not hang down onto the floor.  Have a firm chair that has side arms. You can use this for support while you get dressed.  Do not have throw rugs and other things on the floor that can make you trip. What can I do in the kitchen?  Clean up  any spills right away.  Avoid walking on wet floors.  Keep items that you use a lot in easy-to-reach places.  If you need to reach something above you, use a strong step stool that has a grab bar.  Keep electrical cords out of the way.  Do not use floor polish or wax that makes floors slippery. If you must use wax, use non-skid floor wax.  Do not have throw rugs and other things on the floor that can make you trip. What can I do with my stairs?  Do not leave any items on the stairs.  Make sure that there are handrails on both sides of the stairs and use them. Fix handrails that are broken or loose. Make sure that handrails are as long as the stairways.  Check any carpeting to make sure that it is firmly attached to the stairs. Fix any carpet that is loose or worn.  Avoid having throw rugs at  the top or bottom of the stairs. If you do have throw rugs, attach them to the floor with carpet tape.  Make sure that you have a light switch at the top of the stairs and the bottom of the stairs. If you do not have them, ask someone to add them for you. What else can I do to help prevent falls?  Wear shoes that:  Do not have high heels.  Have rubber bottoms.  Are comfortable and fit you well.  Are closed at the toe. Do not wear sandals.  If you use a stepladder:  Make sure that it is fully opened. Do not climb a closed stepladder.  Make sure that both sides of the stepladder are locked into place.  Ask someone to hold it for you, if possible.  Clearly mark and make sure that you can see:  Any grab bars or handrails.  First and last steps.  Where the edge of each step is.  Use tools that help you move around (mobility aids) if they are needed. These include:  Canes.  Walkers.  Scooters.  Crutches.  Turn on the lights when you go into a dark area. Replace any light bulbs as soon as they burn out.  Set up your furniture so you have a clear path. Avoid moving your furniture around.  If any of your floors are uneven, fix them.  If there are any pets around you, be aware of where they are.  Review your medicines with your doctor. Some medicines can make you feel dizzy. This can increase your chance of falling. Ask your doctor what other things that you can do to help prevent falls. This information is not intended to replace advice given to you by your health care provider. Make sure you discuss any questions you have with your health care provider. Document Released: 09/18/2009 Document Revised: 04/29/2016 Document Reviewed: 12/27/2014 Elsevier Interactive Patient Education  2017 [employer].

## 2020-05-26 ENCOUNTER — Ambulatory Visit: Payer: Medicare Other | Admitting: Obstetrics and Gynecology

## 2020-05-28 NOTE — Chronic Care Management (AMB) (Deleted)
Chronic Care Management Pharmacy  Name: Cynthia Kirk  MRN: [id number] DOB: 19-Nov-1951   Chief Complaint/ HPI  Cynthia Kirk,  69 y.o. , female presents for their Initial CCM visit with the clinical pharmacist via telephone due to COVID-19 Pandemic.  PCP : [name], MD  Their chronic conditions include: Hypertension, Hyperlipidemia, Diabetes, GERD, Asthma, Anxiety and Osteoarthritis   Office Visits: 05/22/20: Patient presented to Lisette Abu, LPN for AWV.  05/08/00: Video visit with Dr. Quay Burow for gastroenteritis. Patient with sore throat, abdominal pain, diarrhea, and nausea. Prescribed ondansetron 4 mg PRN  04/30/20: Patient presented to Dr. Quay Burow for HTN follow-up. Patient referred to dermatology, diabetes education. Metronidazole 0.75% lotion started for rosacea. CoQ10, multivitamin discontinued (patient not taking). Discussed starting a statin.  Consult Visit: ***  Allergies  Allergen Reactions  . Aspirin Hives, Shortness Of Breath and Other (See Comments)    Passed out  . Ibuprofen Nausea And Vomiting and Other (See Comments)    Upsets stomach  . Latex Rash    Medications: Outpatient Encounter Medications as of 05/29/2020  Medication Sig  . albuterol (PROVENTIL) (2.5 MG/3ML) 0.083% nebulizer solution TAKE 3ML(2.'5MG'$ ) BY NEBULIZATION EVERY 6 HOURS AS NEEDED FOR SHORTNESS OF BREATH  . ALPRAZolam (XANAX) 0.5 MG tablet TAKE 1 TABLET (0.5 MG TOTAL) BY MOUTH DAILY AS NEEDED FOR ANXIETY  . BD PEN NEEDLE NANO U/F 32G X 4 MM MISC USE 3 TIMES DAILY AS DIRECTED  . cyclobenzaprine (FLEXERIL) 10 MG tablet TAKE 1/2 TO 1 TABLET 3 TIMES DAILY AS NEEDED FOR MUSCLE SPASMS.  [name] dexlansoprazole (DEXILANT) 60 MG capsule Take 1 capsule (60 mg total) by mouth daily before breakfast.  . famotidine (PEPCID) 20 MG tablet Take 2 tablets (40 mg total) by mouth 2 (two) times daily.  . fluticasone (FLOVENT HFA) 44 MCG/ACT inhaler Inhale 2 puffs into the lungs 2 (two) times daily.  [name] gabapentin  (NEURONTIN) 100 MG capsule TAKE 1 CAPSULE BY MOUTH AT BEDTIME  . hydroquinone 4 % cream Apply 1 application topically 2 (two) times daily.  . Ibuprofen-Famotidine (DUEXIS) 800-26.6 MG TABS Duexis 800 mg-26.6 mg tablet  . ipratropium (ATROVENT) 0.06 % nasal spray Place 2 sprays into both nostrils 2 (two) times daily.   [name] METRONIDAZOLE, TOPICAL, 0.75 % LOTN Apply 1 application topically daily.  . montelukast (SINGULAIR) 10 MG tablet Take 1 tablet (10 mg total) by mouth at bedtime.  . nebivolol (BYSTOLIC) 5 MG tablet Take 1 tablet (5 mg total) by mouth daily.  [name] olmesartan-hydrochlorothiazide ([name] HCT) 40-25 MG tablet Take 1 tablet by mouth daily.  . ondansetron (ZOFRAN ODT) 4 MG disintegrating tablet Take 1 tablet (4 mg total) by mouth every 8 (eight) hours as needed for nausea or vomiting.  . Probiotic Product (PROBIOTIC & ACIDOPHILUS EX ST) CAPS Take 1 capsule by mouth 2 (two) times daily. (Patient taking differently: Take 1 capsule by mouth daily. )  . triamcinolone ointment (KENALOG) 0.5 % APPLY TO AFFECTED AREA TWICE A DAY  . VENTOLIN HFA 108 (90 Base) MCG/ACT inhaler INHALE 2 PUFF  BY MOUTH  EVERY 6 HOUR as NEEDED FOR WHEEZING ,SHORTNESS OF BREATH (Patient taking differently: Inhale 2 puffs into the lungs every 6 (six) hours as needed for wheezing or shortness of breath. )  . VICTOZA 18 MG/3ML SOPN INJECT 0.3 ML SUB Q DAILY   Facility-Administered Encounter Medications as of 05/29/2020  Medication  . triamcinolone acetonide (KENALOG) 10 MG/ML injection 10 mg     Current Diagnosis/Assessment:  Goals Addressed   None     Asthma    Last spirometry score: 87% (06/30/09)  Eosinophil count:   Lab Results  Component Value Date/Time   EOSPCT 1.5 09/20/2019 10:30 AM  %                               Eos (Absolute):  Lab Results  Component Value Date/Time   EOSABS 0.1 09/20/2019 10:30 AM    Tobacco Status:  Social History   Tobacco Use  Smoking Status Never Smoker    Smokeless Tobacco Never Used   Patient has failed these meds in past: *** Patient is currently {CHL Controlled/Uncontrolled:[phone]} on the following medications:  Proventil 0.083% neb q6hr PRN  Flovent HFA 44 mcg/act 2 puff BID  Montelukast 10 mg QHS  Ventolin HFA 108 mcg/act 2 puff q6hr PRN   Using maintenance inhaler regularly? {yes/no:20286} Frequency of rescue inhaler use:  {CHL HP Upstream Pharm Inhaler ZDGU:[id number]}  We discussed:  {CHL HP Upstream Pharmacy [phone]}  Plan  Continue {CHL HP Upstream Pharmacy Plans:[phone]}  Diabetes   A1c goal {A1c goals:23924}  Recent Relevant Labs: Lab Results  Component Value Date/Time   HGBA1C 5.2 04/30/2020 02:59 PM   HGBA1C 5.5 09/20/2019 10:30 AM   GFR 62.85 04/30/2020 02:59 PM   GFR 62.96 09/20/2019 10:30 AM   MICROALBUR <0.7 07/23/2016 11:56 AM   MICROALBUR <0.7 07/09/2015 11:11 AM    Last diabetic Eye exam:  Lab Results  Component Value Date/Time   HMDIABEYEEXA No Retinopathy 07/05/2018 12:00 AM    Last diabetic Foot exam: No results found for: HMDIABFOOTEX   Checking BG: {CHL HP Blood Glucose Monitoring [phone]}  Recent FBG Readings: *** Recent pre-meal BG readings: *** Recent 2hr PP BG readings:  *** Recent HS BG readings: ***  Patient has failed these meds in past: n/a Patient is currently {CHL Controlled/Uncontrolled:[phone]} on the following medications: . Victoza 0.3 mg SQ daily   We discussed: {CHL HP Upstream Pharmacy [phone]}  Plan  Continue {CHL HP Upstream Pharmacy Plans:[phone]}  Hypertension   BP goal is:  {CHL HP UPSTREAM Pharmacist BP [phone]}  Office blood pressures are  BP Readings from Last 3 Encounters:  04/30/20 126/68  09/20/19 118/76  09/19/18 118/68   Patient checks BP at home {CHL HP BP Monitoring Frequency:[id number]} Patient home BP readings are ranging: ***  Patient has failed these meds in the past:  *** Patient is currently {CHL Controlled/Uncontrolled:[phone]} on the following medications:  . Nebivolol 5 mg daily  . Olmesartan-HCTZ 40-25 mg daily   We discussed {CHL HP Upstream Pharmacy [phone]}  Plan  Continue {CHL HP Upstream Pharmacy Plans:[phone]}   Hyperlipidemia   LDL goal < ***  Lipid Panel     Component Value Date/Time   CHOL 190 04/30/2020 1459   TRIG 90.0 04/30/2020 1459   HDL 60.90 04/30/2020 1459   LDLCALC 111 (H) 04/30/2020 1459    Hepatic Function Latest Ref Rng & Units 04/30/2020 09/20/2019 09/19/2018  Total Protein 6.0 - 8.3 g/dL 6.8 7.3 7.2  Albumin 3.5 - 5.2 g/dL 3.8 4.1 3.8  AST 0 - 37 U/L '22 18 18  '$ ALT 0 - 35 U/L '19 8 11  '$ Alk Phosphatase 39 - 117 U/L 135(H) 118(H) 121(H)  Total Bilirubin 0.2 - 1.2 mg/dL 0.5 0.5 0.5  Bilirubin, Direct 0.0 - 0.3 mg/dL - - -     The 10-year ASCVD risk score Mikey Bussing DC Jr.,  et al., 2013) is: 19.5%   Values used to calculate the score:     Age: 57 years     Sex: Female     Is Non-Hispanic African American: No     Diabetic: Yes     Tobacco smoker: No     Systolic Blood Pressure: 476 mmHg     Is BP treated: Yes     HDL Cholesterol: 60.9 mg/dL     Total Cholesterol: 190 mg/dL   Patient has failed these meds in past: n/a Patient is currently {CHL Controlled/Uncontrolled:[phone]} on the following medications:  . none  We discussed:  {CHL HP Upstream Pharmacy [phone]}  Plan  Continue {CHL HP Upstream Pharmacy Plans:[phone]}  Osteopenia   Last DEXA Scan: 08/31/16   T-Score femoral neck: -0.8  T-Score total hip: +0.3  T-Score lumbar spine: +2.1  T-Score forearm radius: n/a  10-year probability of major osteoporotic fracture: n/a  10-year probability of hip fracture: n/a  Vit D, 25-Hydroxy  Date Value Ref Range Status  06/30/2011 42 30 - 89 ng/mL Final    Comment:    This assay accurately quantifies Vitamin D, which is the sum of the 25-Hydroxy forms of Vitamin D2  and D3.  Studies have shown that the optimum concentration of 25-Hydroxy Vitamin D is 30 ng/mL or higher.  Concentrations of Vitamin D between 20 and 29 ng/mL are considered to be insufficient and concentrations less than 20 ng/mL are considered to be deficient for Vitamin D.     Patient {is;is not an osteoporosis candidate:23886}  Patient has failed these meds in past: *** Patient is currently {CHL Controlled/Uncontrolled:[phone]} on the following medications: none  We discussed:  {Osteoporosis Counseling:23892}  Plan  Continue {CHL HP Upstream Pharmacy Plans:[phone]}  Anxiety   PHQ9 Score:  PHQ9 SCORE ONLY 04/30/2020 09/19/2018 04/06/2017  PHQ-9 Total Score 0 0 0   GAD7 Score: No flowsheet data found.  Patient has failed these meds in past: *** Patient is currently {CHL Controlled/Uncontrolled:[phone]} on the following medications:  . Alprazolam 0.5 mg daily PRN   We discussed:  ***  Plan  Continue {CHL HP Upstream Pharmacy Plans:[phone]}  GERD   History of gastroenteritis  Patient has failed these meds in past: *** Patient is currently {CHL Controlled/Uncontrolled:[phone]} on the following medications:  . Dexlansoprazole 60 mg daily  . Famotidine 20 mg 2 tab BID   We discussed:  ***  Plan  Continue {CHL HP Upstream Pharmacy Plans:[phone]}  Chronic Pain    Spinal Stenosis Bilateral osteoarthritis of knee   Patient has failed these meds in past: *** Patient is currently {CHL Controlled/Uncontrolled:[phone]} on the following medications:  . Cyclobenzaprine 10 mg 1/2 - 1 tab TID PRN . Gabapentin 100 mg QHS  . Ibuprofen-Famotidine 800-26.6 mg  We discussed:  ***  Plan  Continue {CHL HP Upstream Pharmacy Plans:[phone]}   Misc / OTC   Patient has failed these meds in past: *** Patient is currently {CHL Controlled/Uncontrolled:[phone]} on the following medications:  . Hydroquinone 4% cream BID  . Atrovent nasal spray 2 spray  BID . Metronidazole 0.75% topical daily  . Ondansetron ODT 4 mg q8hr PRN . Probiotic daily  . Triamcinolone 0.5% ointment BID   We discussed:  ***  Plan  Continue {CHL HP Upstream Pharmacy LYYTK:[id number]}  Vaccines   Reviewed and discussed patient's vaccination history.    Immunization History  Administered Date(s) Administered  . Fluad Quad(high Dose 65+) 09/20/2019  . Influenza Split 10/06/2011  . Influenza Whole 10/14/2009,  09/14/2010  . Influenza, High Dose Seasonal PF 08/19/2016, 08/16/2017  . Influenza, Seasonal, Injecte, Preservative Fre 01/29/2013  . Influenza,inj,Quad PF,6+ Mos 08/16/2013, 09/09/2014, 01/02/2016  . Influenza,inj,quad, With Preservative 08/06/2017  . PFIZER SARS-COV-2 Vaccination 03/13/2020, 04/03/2020  . Pneumococcal Conjugate-13 11/15/2014  . Pneumococcal Polysaccharide-23 03/06/2008, 08/19/2016  . Td 12/06/2004  . Tdap 07/09/2015    Plan  Recommended patient receive *** vaccine in *** office/pharmacy.   Medication Management   Pt uses [facility] for all medications Uses pill box? {Yes or If no, why not?:20788} Pt endorses ***% compliance  We discussed: ***  Plan  {US Pharmacy SXQK:20813}    Follow up: *** month phone visit  ***

## 2020-05-29 ENCOUNTER — Other Ambulatory Visit: Payer: Self-pay

## 2020-05-29 ENCOUNTER — Ambulatory Visit: Payer: Medicare Other | Admitting: Pharmacist

## 2020-05-29 DIAGNOSIS — I10 Essential (primary) hypertension: Secondary | ICD-10-CM

## 2020-05-29 DIAGNOSIS — Z1231 Encounter for screening mammogram for malignant neoplasm of breast: Secondary | ICD-10-CM | POA: Diagnosis not present

## 2020-05-29 DIAGNOSIS — J4520 Mild intermittent asthma, uncomplicated: Secondary | ICD-10-CM

## 2020-05-29 DIAGNOSIS — E119 Type 2 diabetes mellitus without complications: Secondary | ICD-10-CM

## 2020-05-29 NOTE — Patient Instructions (Addendum)
Visit Information  Phone number for Pharmacist: [phone]  Thank you for meeting with me to discuss your medications! I look forward to working with you to achieve your health care goals. Below is a summary of what we talked about during the visit:  Goals Addressed            This Visit's Progress   . Pharmacy Care Plan       CARE PLAN ENTRY (see longitudinal plan of care for additional care plan information)  Current Barriers:  . Chronic Disease Management support, education, and care coordination needs related to Hypertension, Diabetes, and Asthma   Hypertension BP Readings from Last 3 Encounters:  04/30/20 126/68  09/20/19 118/76  09/19/18 118/68 .  Pharmacist Clinical Goal(s): o Over the next 90 days, patient will work with PharmD and providers to maintain BP goal <130/80 . Current regimen:  . Bystolic 5 mg daily  . Olmesartan-HCTZ 40-25 mg daily  . Interventions: o Discussed BP goals and benefits of medication for prevention of heart attack / stroke o May be able to reduce BP medications since currently only taking olmesartan-HCTZ 2-3 times per week . Patient self care activities - Over the next 90 days, patient will: o Check BP daily, document, and provide at future appointments o Ensure daily salt intake < 2300 mg/day  Diabetes Lab Results  Component Value Date/Time   HGBA1C 5.2 04/30/2020 02:59 PM   HGBA1C 5.5 09/20/2019 10:30 AM .  Pharmacist Clinical Goal(s): o Over the next 90 days, patient will work with PharmD and providers to maintain A1c goal <6.5% . Current regimen:  o Victoza 0.3 mg daily . Interventions: o Discussed A1c goal and benefits of Victoza for weight loss and cardiovascular risk reduction in addition to gluose lowering benefits . Patient self care activities - Over the next 90 days, patient will: o Check blood sugar in the morning before eating or drinking, document, and provide at future appointments o Contact provider with any episodes  of hypoglycemia  Asthma (mild) . Pharmacist Clinical Goal(s) o Over the next 90 days, patient will work with PharmD and providers to optimize therapy . Current regimen:  o Albuterol 0.083% neb q6hr PRN  o Flovent HFA 44 mcg/act 2 puff BID  o Montelukast 10 mg QHS  o Ventolin HFA 108 mcg/act 2 puff q6hr PRN  . Interventions: o Discussed Flovent as controller inhaler and Ventolin as rescue inhaler o Discussed marginal benefits of montelukast . Patient self care activities - Over the next 90 days, patient will: o Continue medications as prescribed o Follow up with pulmonology regarding allergic symptoms and CPAP  Medication management . Pharmacist Clinical Goal(s): o Over the next 90 days, patient will work with PharmD and providers to achieve optimal medication adherence . [facility] . Interventions o Comprehensive medication review performed. o Utilize [facility] for medication synchronization, packaging and delivery . Patient self care activities - Over the next 90 days, patient will: o Focus on medication adherence by fill date o Take medications as prescribed o Report any questions or concerns to PharmD and/or provider(s)  Initial goal documentation      Cynthia Kirk was given information about Chronic Care Management services today including:  1. CCM service includes personalized support from designated clinical staff supervised by her physician, including individualized plan of care and coordination with other care providers 2. 24/7 contact phone numbers for assistance for urgent and routine care needs. 3. Standard insurance, coinsurance, copays and deductibles apply  for chronic care management only during months in which we provide at least 20 minutes of these services. Most insurances cover these services at 100%, however patients may be responsible for any copay, coinsurance and/or deductible if applicable. This service may help you avoid the need for more  expensive face-to-face services. 4. Only one practitioner may furnish and bill the service in a calendar month. 5. The patient may stop CCM services at any time (effective at the end of the month) by phone call to the office staff.  Patient agreed to services and verbal consent obtained.   The patient verbalized understanding of instructions provided today and agreed to receive a mailed copy of patient instruction and/or educational materials. Telephone follow up appointment with pharmacy team member scheduled for: 3 months  Charlene Brooke, PharmD Clinical Pharmacist [facility] [phone]  Food Choices for Gastroesophageal Reflux Disease, Adult When you have gastroesophageal reflux disease (GERD), the foods you eat and your eating habits are very important. Choosing the right foods can help ease the discomfort of GERD. Consider working with a diet and nutrition specialist (dietitian) to help you make healthy food choices. What general guidelines should I follow?  Eating plan  Choose healthy foods low in fat, such as fruits, vegetables, whole grains, low-fat dairy products, and lean meat, fish, and poultry.  Eat frequent, small meals instead of three large meals each day. Eat your meals slowly, in a relaxed setting. Avoid bending over or lying down until 2-3 hours after eating.  Limit high-fat foods such as fatty meats or fried foods.  Limit your intake of oils, butter, and shortening to less than 8 teaspoons each day.  Avoid the following: ? Foods that cause symptoms. These may be different for different people. Keep a food diary to keep track of foods that cause symptoms. ? Alcohol. ? Drinking large amounts of liquid with meals. ? Eating meals during the 2-3 hours before bed.  Cook foods using methods other than frying. This may include baking, grilling, or broiling. Lifestyle  Maintain a healthy weight. Ask your health care provider what weight is  healthy for you. If you need to lose weight, work with your health care provider to do so safely.  Exercise for at least 30 minutes on 5 or more days each week, or as told by your health care provider.  Avoid wearing clothes that fit tightly around your waist and chest.  Do not use any products that contain nicotine or tobacco, such as cigarettes and e-cigarettes. If you need help quitting, ask your health care provider.  Sleep with the head of your bed raised. Use a wedge under the mattress or blocks under the bed frame to raise the head of the bed. What foods are not recommended? The items listed may not be a complete list. Talk with your dietitian about what dietary choices are best for you. Grains Pastries or quick breads with added fat. [place] toast. Vegetables Deep fried vegetables. [place] fries. Any vegetables prepared with added fat. Any vegetables that cause symptoms. For some people this may include tomatoes and tomato products, chili peppers, onions and garlic, and horseradish. Fruits Any fruits prepared with added fat. Any fruits that cause symptoms. For some people this may include citrus fruits, such as oranges, grapefruit, pineapple, and lemons. Meats and other protein foods High-fat meats, such as fatty beef or pork, hot dogs, ribs, ham, sausage, salami and bacon. Fried meat or protein, including fried fish and fried  chicken. Nuts and nut butters. Dairy Whole milk and chocolate milk. Sour cream. Cream. Ice cream. Cream cheese. Milk shakes. Beverages Coffee and tea, with or without caffeine. Carbonated beverages. Sodas. Energy drinks. Fruit juice made with acidic fruits (such as orange or grapefruit). Tomato juice. Alcoholic drinks. Fats and oils Butter. Margarine. Shortening. Ghee. Sweets and desserts Chocolate and cocoa. Donuts. Seasoning and other foods Pepper. Peppermint and spearmint. Any condiments, herbs, or seasonings that cause symptoms. For some people, this may  include curry, hot sauce, or vinegar-based salad dressings. Summary  When you have gastroesophageal reflux disease (GERD), food and lifestyle choices are very important to help ease the discomfort of GERD.  Eat frequent, small meals instead of three large meals each day. Eat your meals slowly, in a relaxed setting. Avoid bending over or lying down until 2-3 hours after eating.  Limit high-fat foods such as fatty meat or fried foods. This information is not intended to replace advice given to you by your health care provider. Make sure you discuss any questions you have with your health care provider. Document Revised: 03/15/2019 Document Reviewed: 11/23/2016 Elsevier Patient Education  [employer].

## 2020-05-29 NOTE — Addendum Note (Signed)
Addended by: Karle Barr on: 05/29/2020 05:11 PM   Modules accepted: Orders

## 2020-05-29 NOTE — Chronic Care Management (AMB) (Signed)
Chronic Care Management Pharmacy  Name: Cynthia Kirk  MRN: [id number] DOB: August 09, 1951   Chief Complaint/ HPI  Cynthia Kirk,  69 y.o. , female presents for their Initial CCM visit with the clinical pharmacist via telephone due to COVID-19 Pandemic.  PCP : [name], MD  Their chronic conditions include: Hypertension, Hyperlipidemia, Diabetes, GERD, Asthma, Anxiety and Osteoarthritis   From [place], came to visit 15 years ago and wanted to live here. 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  Office Visits: 05/22/20: Patient presented to Lisette Abu, LPN for AWV.  4/0/98: Video visit with Dr. Quay Burow for gastroenteritis. Patient with sore throat, abdominal pain, diarrhea, and nausea. Prescribed ondansetron 4 mg PRN  04/30/20: Patient presented to Dr. Quay Burow for HTN follow-up. Patient referred to dermatology, diabetes education. Metronidazole 0.75% lotion started for rosacea. CoQ10, multivitamin discontinued (patient not taking). Discussed starting a statin.   Consult Visit: 11/20/19 Dr Fuller Plan (GI): GERD and constipation. Continue Dexilant and famotidine. Increase dietary fiber and Miralax PRN. Colonoscopy due Oct 2022.  Allergies  Allergen Reactions  . Aspirin Hives, Shortness Of Breath and Other (See Comments)    Passed out  . Ibuprofen Nausea And Vomiting and Other (See Comments)    Upsets stomach  . Latex Rash   Medications: Outpatient Encounter Medications as of 05/29/2020  Medication Sig  . albuterol (PROVENTIL) (2.5 MG/3ML) 0.083% nebulizer solution TAKE 3ML(2.5MG ) BY NEBULIZATION EVERY 6 HOURS AS NEEDED FOR SHORTNESS OF BREATH  . ALPRAZolam (XANAX) 0.5 MG tablet TAKE 1 TABLET (0.5 MG TOTAL) BY MOUTH DAILY AS NEEDED FOR ANXIETY  . BD PEN NEEDLE NANO U/F 32G X 4 MM MISC USE 3 TIMES DAILY AS DIRECTED  . cyclobenzaprine (FLEXERIL) 10 MG tablet TAKE 1/2 TO 1 TABLET 3 TIMES DAILY AS NEEDED  FOR MUSCLE SPASMS.  [name] dexlansoprazole (DEXILANT) 60 MG capsule Take 1 capsule (60 mg total) by mouth daily before breakfast.  . famotidine (PEPCID) 20 MG tablet Take 2 tablets (40 mg total) by mouth 2 (two) times daily.  . fluticasone (FLOVENT HFA) 44 MCG/ACT inhaler Inhale 2 puffs into the lungs 2 (two) times daily.  [name] gabapentin (NEURONTIN) 100 MG capsule TAKE 1 CAPSULE BY MOUTH AT BEDTIME  . hydroquinone 4 % cream Apply 1 application topically 2 (two) times daily.  . Ibuprofen-Famotidine (DUEXIS) 800-26.6 MG TABS Duexis 800 mg-26.6 mg tablet  . ipratropium (ATROVENT) 0.06 % nasal spray Place 2 sprays into both nostrils 2 (two) times daily.   [name] METRONIDAZOLE, TOPICAL, 0.75 % LOTN Apply 1 application topically daily.  . montelukast (SINGULAIR) 10 MG tablet Take 1 tablet (10 mg total) by mouth at bedtime.  . nebivolol (BYSTOLIC) 5 MG tablet Take 1 tablet (5 mg total) by mouth daily.  [name] olmesartan-hydrochlorothiazide ([name] HCT) 40-25 MG tablet Take 1 tablet by mouth daily.  . ondansetron (ZOFRAN ODT) 4 MG disintegrating tablet Take 1 tablet (4 mg total) by mouth every 8 (eight) hours as needed for nausea or vomiting.  . Probiotic Product (PROBIOTIC & ACIDOPHILUS EX ST) CAPS Take 1 capsule by mouth 2 (two) times daily. (Patient taking differently: Take 1 capsule by mouth daily. )  . triamcinolone ointment (KENALOG) 0.5 % APPLY TO AFFECTED AREA TWICE A DAY  . VENTOLIN HFA 108 (90 Base) MCG/ACT inhaler INHALE 2 PUFF  BY MOUTH  EVERY 6 HOUR as NEEDED FOR WHEEZING ,SHORTNESS OF BREATH (Patient taking differently: Inhale 2  puffs into the lungs every 6 (six) hours as needed for wheezing or shortness of breath. )  . VICTOZA 18 MG/3ML SOPN INJECT 0.3 ML SUB Q DAILY   Facility-Administered Encounter Medications as of 05/29/2020  Medication  . triamcinolone acetonide (KENALOG) 10 MG/ML injection 10 mg     Current Diagnosis/Assessment:  SDOH Interventions     Most Recent Value  SDOH Interventions    Financial Strain Interventions Intervention Not Indicated      Goals Addressed            This Visit's Progress   . Pharmacy Care Plan       CARE PLAN ENTRY (see longitudinal plan of care for additional care plan information)  Current Barriers:  . Chronic Disease Management support, education, and care coordination needs related to Hypertension, Diabetes, and Asthma   Hypertension BP Readings from Last 3 Encounters:  04/30/20 126/68  09/20/19 118/76  09/19/18 118/68 .  Pharmacist Clinical Goal(s): o Over the next 90 days, patient will work with PharmD and providers to maintain BP goal <130/80 . Current regimen:  . Bystolic 5 mg daily  . Olmesartan-HCTZ 40-25 mg daily  . Interventions: o Discussed BP goals and benefits of medication for prevention of heart attack / stroke o May be able to reduce BP medications since currently only taking olmesartan-HCTZ 2-3 times per week . Patient self care activities - Over the next 90 days, patient will: o Check BP daily, document, and provide at future appointments o Ensure daily salt intake < 2300 mg/day  Diabetes Lab Results  Component Value Date/Time   HGBA1C 5.2 04/30/2020 02:59 PM   HGBA1C 5.5 09/20/2019 10:30 AM .  Pharmacist Clinical Goal(s): o Over the next 90 days, patient will work with PharmD and providers to maintain A1c goal <6.5% . Current regimen:  o Victoza 0.3 mg daily . Interventions: o Discussed A1c goal and benefits of Victoza for weight loss and cardiovascular risk reduction in addition to gluose lowering benefits . Patient self care activities - Over the next 90 days, patient will: o Check blood sugar in the morning before eating or drinking, document, and provide at future appointments o Contact provider with any episodes of hypoglycemia  Asthma (mild) . Pharmacist Clinical Goal(s) o Over the next 90 days, patient will work with PharmD and providers to optimize therapy . Current regimen:  o Albuterol  0.083% neb q6hr PRN  o Flovent HFA 44 mcg/act 2 puff BID  o Montelukast 10 mg QHS  o Ventolin HFA 108 mcg/act 2 puff q6hr PRN  . Interventions: o Discussed Flovent as controller inhaler and Ventolin as rescue inhaler o Discussed marginal benefits of montelukast . Patient self care activities - Over the next 90 days, patient will: o Continue medications as prescribed o Follow up with pulmonology regarding allergic symptoms and CPAP  Medication management . Pharmacist Clinical Goal(s): o Over the next 90 days, patient will work with PharmD and providers to achieve optimal medication adherence . [facility] . Interventions o Comprehensive medication review performed. o Utilize [facility] pharmacy for medication synchronization, packaging and delivery . Patient self care activities - Over the next 90 days, patient will: o Focus on medication adherence by fill date o Take medications as prescribed o Report any questions or concerns to PharmD and/or provider(s)  Initial goal documentation       Asthma / Pulmonary sarcoidosis   Last spirometry score: FEV1 87% predicted (06/30/09)  Eosinophil count:   Lab Results  Component Value Date/Time   EOSPCT 1.5 09/20/2019 10:30 AM  %                               Eos (Absolute):  Lab Results  Component Value Date/Time   EOSABS 0.1 09/20/2019 10:30 AM   Tobacco Status:  Social History   Tobacco Use  Smoking Status Never Smoker  Smokeless Tobacco Never Used   Patient has failed these meds in past: n/a Patient is currently controlled on the following medications:   Albuterol 0.083% neb q6hr PRN   Flovent HFA 44 mcg/act 2 puff BID   Montelukast 10 mg QHS   Ventolin HFA 108 mcg/act 2 puff q6hr PRN   Ipratropium (Atrovent) nasal spray 2 spray BID  Using maintenance inhaler regularly? No Frequency of rescue inhaler use:  1-2x per week  We discussed:  Uses Flovent to prevent issues when she knows she is going to partake  in an activity that will cause SOB. Pt does not feel any difference with montelukast. She uses Ventolin for shortness of breath and albuterol nebs for particularly severe symptoms  Plan  Continue current medications  Diabetes / Weight loss   A1c goal <6.5%  Recent Relevant Labs: Lab Results  Component Value Date/Time   HGBA1C 5.2 04/30/2020 02:59 PM   HGBA1C 5.5 09/20/2019 10:30 AM   GFR 62.85 04/30/2020 02:59 PM   GFR 62.96 09/20/2019 10:30 AM   MICROALBUR <0.7 07/23/2016 11:56 AM   MICROALBUR <0.7 07/09/2015 11:11 AM    Last diabetic Eye exam:  Lab Results  Component Value Date/Time   HMDIABEYEEXA No Retinopathy 07/05/2018 12:00 AM    Last diabetic Foot exam: No results found for: HMDIABFOOTEX   Checking BG: Rarely  Recent FBG Readings: 85-100  Patient has failed these meds in past: n/a Patient is currently controlled on the following medications: . Victoza 0.3 mg SQ daily   We discussed: diet and exercise extensively; benefits of Victoza for weight loss and CV benefit  Plan  Continue current medications and control with diet and exercise  Hypertension   BP goal is:  <130/80  Office blood pressures are  BP Readings from Last 3 Encounters:  04/30/20 126/68  09/20/19 118/76  09/19/18 118/68   Kidney Function Lab Results  Component Value Date/Time   CREATININE 0.89 04/30/2020 02:59 PM   CREATININE 0.89 09/20/2019 10:30 AM   GFR 62.85 04/30/2020 02:59 PM   GFRNONAA >60 12/27/2017 02:19 PM   GFRAA >60 12/27/2017 02:19 PM   K 3.9 04/30/2020 02:59 PM   K 3.5 09/20/2019 10:30 AM   Patient checks BP at home daily Patient home BP readings are ranging: 114-115/70-80  Patient has failed these meds in the past: n/a Patient is currently controlled on the following medications:  . Nebivolol 5 mg daily HS . Olmesartan-HCTZ 40-25 mg daily AM  We discussed; pt is taking Olmesartan-HCTZ a few times a week and reports BP is controlled no matter if she has taken the  med or not that day. May consider discontinuing Bystolic as BP is well controlled.  Plan  Consider discontinue Bystolic as BP is very well controlled   Hyperlipidemia   LDL goal < 100  Lipid Panel     Component Value Date/Time   CHOL 190 04/30/2020 1459   TRIG 90.0 04/30/2020 1459   HDL 60.90 04/30/2020 1459   LDLCALC 111 (H) 04/30/2020 1459    The 10-year ASCVD  risk score Mikey Bussing DC Jr., et al., 2013) is: 19.5%   Values used to calculate the score:     Age: 39 years     Sex: Female     Is Non-Hispanic African American: No     Diabetic: Yes     Tobacco smoker: No     Systolic Blood Pressure: 941 mmHg     Is BP treated: Yes     HDL Cholesterol: 60.9 mg/dL     Total Cholesterol: 190 mg/dL   Patient has failed these meds in past: n/a Patient is currently uncontrolled on the following medications:  . none  We discussed:  diet and exercise extensively  Plan  Continue control with diet and exercise  Osteopenia   Last DEXA Scan: 08/31/16   T-Score femoral neck: -0.8  T-Score total hip: +0.3  T-Score lumbar spine: +2.1  T-Score forearm radius: n/a  10-year probability of major osteoporotic fracture: n/a  10-year probability of hip fracture: n/a  Vit D, 25-Hydroxy  Date Value Ref Range Status  06/30/2011 42 30 - 89 ng/mL Final    Patient is not a candidate for pharmacologic treatment  Patient has failed these meds in past: n/a Patient is currently controlled on the following medications: none  We discussed:  No medication indicated  Plan  Continue control with diet and exercise  Anxiety   Depression screen [facility] 2/9 04/30/2020 09/19/2018 04/06/2017  Decreased Interest 0 0 0  Down, Depressed, Hopeless 0 0 0  PHQ - 2 Score 0 0 0  Some recent data might be hidden   Patient has failed these meds in past: n/a Patient is currently controlled on the following medications:  . Alprazolam 0.5 mg daily PRN   We discussed:  Pt reports she has panic attacks sometimes;  she does not endorse any specific triggers for these attacks, they just tend to happen.   Plan  Continue current medications  GERD   History of gastroenteritis  Patient has failed these meds in past: n/a Patient is currently controlled on the following medications:  . Dexlansoprazole 60 mg daily  . Famotidine 20 mg 2 tab BID   We discussed:  Pt reports some days she doesn't take Dexilant, some days she does not have reflux. She is trying to pinpoint which foods trigger it.  Plan  Continue current medications  Chronic Pain    Spinal Stenosis Bilateral osteoarthritis of knee   Patient has failed these meds in past: n/a Patient is currently controlled on the following medications:  . Cyclobenzaprine 10 mg 1/2 - 1 tab TID PRN . Gabapentin 100 mg QHS  . Ibuprofen-Famotidine 800-26.6 mg (Duexis)  We discussed:  Pt tries to limit Duexis due to side effects; she takes gabapentin for leg pain; also wearing compression stockings  Plan  Continue current medications   Misc / OTC   Patient has failed these meds in past: n/a Patient is currently controlled on the following medications:  . Hydroquinone 4% cream BID  . Metronidazole 0.75% topical daily  . Ondansetron ODT 4 mg q8hr PRN . Probiotic daily  . Triamcinolone 0.5% ointment BID  . CoQ10   We discussed:  Hydroquinone and triamcinolone for eczema flares. Pt has appt with dermatologist.   Plan  Continue current medications   Medication Management   [facility] Uses pill box? Yes Pt endorses 90% compliance  We discussed: Verbal consent obtained for [facility] enhanced pharmacy services (medication synchronization, adherence packaging, delivery  coordination). A medication sync plan was created to allow patient to get all medications delivered once every 30 to 90 days per patient preference. Patient understands they have freedom to choose pharmacy and clinical pharmacist will  coordinate care between all prescribers and [facility].   Plan  Utilize [facility] for medication synchronization, packaging and delivery    Follow up: 3 month phone visit  Charlene Brooke, PharmD Clinical Pharmacist [facility] at [facility] [phone]

## 2020-06-04 ENCOUNTER — Telehealth: Payer: Self-pay | Admitting: Pulmonary Disease

## 2020-06-04 DIAGNOSIS — J0100 Acute maxillary sinusitis, unspecified: Secondary | ICD-10-CM

## 2020-06-04 DIAGNOSIS — D860 Sarcoidosis of lung: Secondary | ICD-10-CM

## 2020-06-04 DIAGNOSIS — J45998 Other asthma: Secondary | ICD-10-CM

## 2020-06-04 MED ORDER — FLOVENT HFA 44 MCG/ACT IN AERO: 2.0000 | INHALATION_SPRAY | Freq: Two times a day (BID) | RESPIRATORY_TRACT | 3 refills | Status: DC

## 2020-06-04 MED ORDER — ALBUTEROL SULFATE (2.5 MG/3ML) 0.083% IN NEBU: INHALATION_SOLUTION | RESPIRATORY_TRACT | 11 refills | Status: DC

## 2020-06-04 MED ORDER — ALBUTEROL SULFATE HFA 108 (90 BASE) MCG/ACT IN AERS: INHALATION_SPRAY | RESPIRATORY_TRACT | 3 refills | Status: DC

## 2020-06-04 NOTE — Telephone Encounter (Signed)
Pt's meds have been sent to preferred pharmacy. Nothing further needed.

## 2020-06-06 ENCOUNTER — Other Ambulatory Visit: Payer: Self-pay

## 2020-06-06 MED ORDER — NEBIVOLOL HCL 5 MG PO TABS: 5.0000 mg | ORAL_TABLET | Freq: Every day | ORAL | 1 refills | Status: DC

## 2020-06-06 MED ORDER — VICTOZA 18 MG/3ML ~~LOC~~ SOPN: PEN_INJECTOR | SUBCUTANEOUS | 0 refills | Status: DC

## 2020-06-06 MED ORDER — OLMESARTAN MEDOXOMIL-HCTZ 40-25 MG PO TABS: 1.0000 | ORAL_TABLET | Freq: Every day | ORAL | 1 refills | Status: DC

## 2020-06-06 MED ORDER — MONTELUKAST SODIUM 10 MG PO TABS: 10.0000 mg | ORAL_TABLET | Freq: Every day | ORAL | 0 refills | Status: DC

## 2020-06-06 MED ORDER — DEXILANT 60 MG PO CPDR: 60.0000 mg | DELAYED_RELEASE_CAPSULE | Freq: Every day | ORAL | 1 refills | Status: DC

## 2020-06-10 ENCOUNTER — Telehealth: Payer: Self-pay | Admitting: Internal Medicine

## 2020-06-10 NOTE — Telephone Encounter (Signed)
[name] - I believe I signed the order today

## 2020-06-10 NOTE — Telephone Encounter (Signed)
    [facility] calling to request order for right breast limited ultrasound, mammogram if needed Phone [phone] Fax [phone]

## 2020-06-11 NOTE — Telephone Encounter (Signed)
[facility] calling again about request for order. If it has been faxed please re fax they have not received it.

## 2020-06-11 NOTE — Telephone Encounter (Signed)
Refaxed order today to both numbers

## 2020-06-17 ENCOUNTER — Ambulatory Visit: Payer: Medicare Other | Admitting: Registered"

## 2020-06-17 DIAGNOSIS — N6041 Mammary duct ectasia of right breast: Secondary | ICD-10-CM | POA: Diagnosis not present

## 2020-07-14 ENCOUNTER — Telehealth: Payer: Self-pay | Admitting: Pharmacist

## 2020-07-14 NOTE — Progress Notes (Signed)
Pt called requesting refills.  Ipratropium 0.06% nasal spray - to [id number]  Alprazolam 0.5 mg - to [facility] -last prescribed 04/03/20 #90 with 0 refills  Will coordinate with PCP for refills.

## 2020-07-14 NOTE — Progress Notes (Addendum)
Chronic Care Management Pharmacy Assistant   Name: Mishika Flippen  MRN: [id number] DOB: Dec 15, 1950  Reason for Encounter: Medication Review  Patient Questions:  1.  Have you seen any other providers since your last visit? No  2.  Any changes in your medicines or health? No   PCP : [name], MD  Allergies:   Allergies  Allergen Reactions  . Aspirin Hives, Shortness Of Breath and Other (See Comments)    Passed out  . Ibuprofen Nausea And Vomiting and Other (See Comments)    Upsets stomach  . Latex Rash    Medications: Outpatient Encounter Medications as of 07/14/2020  Medication Sig  . albuterol (PROVENTIL) (2.5 MG/3ML) 0.083% nebulizer solution TAKE 3ML(2.5MG ) BY NEBULIZATION EVERY 6 HOURS AS NEEDED FOR SHORTNESS OF BREATH  . albuterol (VENTOLIN HFA) 108 (90 Base) MCG/ACT inhaler INHALE 2 PUFF&nbsp;&nbsp;BY MOUTH&nbsp;&nbsp;EVERY 6 HOUR as NEEDED FOR WHEEZING ,SHORTNESS OF BREATH  . ALPRAZolam (XANAX) 0.5 MG tablet TAKE 1 TABLET (0.5 MG TOTAL) BY MOUTH DAILY AS NEEDED FOR ANXIETY  . BD PEN NEEDLE NANO U/F 32G X 4 MM MISC USE 3 TIMES DAILY AS DIRECTED  . cyclobenzaprine (FLEXERIL) 10 MG tablet TAKE 1/2 TO 1 TABLET 3 TIMES DAILY AS NEEDED FOR MUSCLE SPASMS.  [name] dexlansoprazole (DEXILANT) 60 MG capsule Take 1 capsule (60 mg total) by mouth daily before breakfast.  . famotidine (PEPCID) 20 MG tablet Take 2 tablets (40 mg total) by mouth 2 (two) times daily.  . fluticasone (FLOVENT HFA) 44 MCG/ACT inhaler Inhale 2 puffs into the lungs 2 (two) times daily.  [name] gabapentin (NEURONTIN) 100 MG capsule TAKE 1 CAPSULE BY MOUTH AT BEDTIME  . hydroquinone 4 % cream Apply 1 application topically 2 (two) times daily.  . Ibuprofen-Famotidine (DUEXIS) 800-26.6 MG TABS Duexis 800 mg-26.6 mg tablet  . ipratropium (ATROVENT) 0.06 % nasal spray Place 2 sprays into both nostrils 2 (two) times daily.   [name] liraglutide (VICTOZA) 18 MG/3ML SOPN INJECT 0.3 ML SUB Q DAILY  . METRONIDAZOLE, TOPICAL, 0.75  % LOTN Apply 1 application topically daily.  . montelukast (SINGULAIR) 10 MG tablet Take 1 tablet (10 mg total) by mouth at bedtime.  . nebivolol (BYSTOLIC) 5 MG tablet Take 1 tablet (5 mg total) by mouth daily.  [name] olmesartan-hydrochlorothiazide ([name] HCT) 40-25 MG tablet Take 1 tablet by mouth daily.  . ondansetron (ZOFRAN ODT) 4 MG disintegrating tablet Take 1 tablet (4 mg total) by mouth every 8 (eight) hours as needed for nausea or vomiting.  . Probiotic Product (PROBIOTIC & ACIDOPHILUS EX ST) CAPS Take 1 capsule by mouth 2 (two) times daily. (Patient taking differently: Take 1 capsule by mouth daily. )  . triamcinolone ointment (KENALOG) 0.5 % APPLY TO AFFECTED AREA TWICE A DAY   Facility-Administered Encounter Medications as of 07/14/2020  Medication  . triamcinolone acetonide (KENALOG) 10 MG/ML injection 10 mg    Current Diagnosis: Patient Active Problem List   Diagnosis Date Noted  . Gastroenteritis 05/13/2020  . Rosacea 04/30/2020  . Bilateral primary osteoarthritis of knee 04/30/2020  . Neck pain on left side 08/03/2018  . Atrophic vaginitis 02/23/2018  . Osteopenia 02/23/2018  . Urinary incontinence 02/23/2018  . Vasomotor rhinitis 07/26/2017  . Anxiety 02/01/2017  . Renal cyst 07/23/2016  . Cervical disc disorder with radiculopathy of cervical region 03/13/2016  . B12 deficiency   . Cataract 02/25/2014  . Glaucoma suspect 02/25/2014  . Lumbar spinal stenosis 03/07/2013  . Ejection fraction   . Syncope   .  Severe obesity (BMI >= 40) ([place]) 09/14/2010  . Essential hypertension 09/14/2010  . GERD 08/22/2009  . Pulmonary sarcoidosis ([place]) 06/30/2009  . Diabetes mellitus ([place]) 06/30/2009  . OSA on CPAP 06/30/2009  . Upper airway cough syndrome 06/30/2009  . Mild intermittent asthma 06/30/2009    Goals Addressed   None     Follow-Up:  Coordination of Enhanced Pharmacy Services  Received call from patient regarding medication management via [facility].  Patient requested an acute fill for Gabapentin 100mg , Dexilant 60mg , and Pepcid prescribed by Dr. Fuller Plan  to be delivered: Pharmacy needs refills? Yes- Called Dr. Silvio Pate office at [phone] spoke with Caryl Pina she sent a message to Dr. Lynne Leader CMA requesting a new RX order.  Confirmed delivery date of 07/25/2020, advised patient that pharmacy will contact them the morning of delivery.  Rosendo Gros

## 2020-07-15 ENCOUNTER — Telehealth: Payer: Self-pay | Admitting: Gastroenterology

## 2020-07-15 ENCOUNTER — Other Ambulatory Visit: Payer: Self-pay

## 2020-07-15 ENCOUNTER — Telehealth: Payer: Self-pay

## 2020-07-15 MED ORDER — FAMOTIDINE 20 MG PO TABS: 40.0000 mg | ORAL_TABLET | Freq: Two times a day (BID) | ORAL | 5 refills | Status: DC

## 2020-07-15 MED ORDER — IPRATROPIUM BROMIDE 0.06 % NA SOLN: 2.0000 | Freq: Two times a day (BID) | NASAL | 2 refills | Status: AC

## 2020-07-15 NOTE — Telephone Encounter (Signed)
-----   Message from Charlton Haws, [facility] sent at 07/14/2020  1:55 PM EDT ----- Regarding: Med refill Ms Boehning needs her ipratropium nasal spray (Atrovent) rx [id number] - can you refill this for her?  She also needs Alprazolam refilled to [facility] - do you know which MD is covering Dr Quay Burow this week?

## 2020-07-15 NOTE — Telephone Encounter (Signed)
Prescription sent to patient's pharmacy.

## 2020-07-16 ENCOUNTER — Telehealth: Payer: Self-pay

## 2020-07-17 ENCOUNTER — Other Ambulatory Visit: Payer: Self-pay | Admitting: Internal Medicine

## 2020-07-17 NOTE — Telephone Encounter (Signed)
MD is out of the office.... Check  registry last filled 04/03/2020 pls advise on refill

## 2020-07-21 ENCOUNTER — Ambulatory Visit: Payer: Medicare Other | Admitting: Obstetrics and Gynecology

## 2020-07-29 ENCOUNTER — Telehealth: Payer: Self-pay | Admitting: Pharmacist

## 2020-07-29 ENCOUNTER — Ambulatory Visit: Payer: Medicare Other | Admitting: Skilled Nursing Facility1

## 2020-07-29 ENCOUNTER — Other Ambulatory Visit: Payer: Self-pay

## 2020-07-29 MED ORDER — MONTELUKAST SODIUM 10 MG PO TABS: 10.0000 mg | ORAL_TABLET | Freq: Every day | ORAL | 0 refills | Status: DC

## 2020-07-29 NOTE — Progress Notes (Addendum)
Chronic Care Management Pharmacy Assistant   Name: Evangelyne Loja  MRN: [id number] DOB: 1951/01/28  Reason for Encounter: Medication Review  Patient Questions:  1.  Have you seen any other providers since your last visit? No  2.  Any changes in your medicines or health? No   PCP : [name], MD  Allergies:   Allergies  Allergen Reactions  . Aspirin Hives, Shortness Of Breath and Other (See Comments)    Passed out  . Ibuprofen Nausea And Vomiting and Other (See Comments)    Upsets stomach  . Latex Rash    Medications: Outpatient Encounter Medications as of 07/29/2020  Medication Sig  . albuterol (PROVENTIL) (2.5 MG/3ML) 0.083% nebulizer solution TAKE 3ML(2.5MG ) BY NEBULIZATION EVERY 6 HOURS AS NEEDED FOR SHORTNESS OF BREATH  . albuterol (VENTOLIN HFA) 108 (90 Base) MCG/ACT inhaler INHALE 2 PUFF&nbsp;&nbsp;BY MOUTH&nbsp;&nbsp;EVERY 6 HOUR as NEEDED FOR WHEEZING ,SHORTNESS OF BREATH  . ALPRAZolam (XANAX) 0.5 MG tablet TAKE 1 TABLET BY MOUTH EVERY DAY AS NEEDED FOR ANXIETY  . BD PEN NEEDLE NANO U/F 32G X 4 MM MISC USE 3 TIMES DAILY AS DIRECTED  . cyclobenzaprine (FLEXERIL) 10 MG tablet TAKE 1/2 TO 1 TABLET 3 TIMES DAILY AS NEEDED FOR MUSCLE SPASMS.  [name] dexlansoprazole (DEXILANT) 60 MG capsule Take 1 capsule (60 mg total) by mouth daily before breakfast.  . famotidine (PEPCID) 20 MG tablet Take 2 tablets (40 mg total) by mouth 2 (two) times daily.  . fluticasone (FLOVENT HFA) 44 MCG/ACT inhaler Inhale 2 puffs into the lungs 2 (two) times daily.  [name] gabapentin (NEURONTIN) 100 MG capsule TAKE 1 CAPSULE BY MOUTH AT BEDTIME  . hydroquinone 4 % cream Apply 1 application topically 2 (two) times daily.  . Ibuprofen-Famotidine (DUEXIS) 800-26.6 MG TABS Duexis 800 mg-26.6 mg tablet  . ipratropium (ATROVENT) 0.06 % nasal spray Place 2 sprays into both nostrils 2 (two) times daily.  [name] liraglutide (VICTOZA) 18 MG/3ML SOPN INJECT 0.3 ML SUB Q DAILY  . METRONIDAZOLE, TOPICAL, 0.75 % LOTN  Apply 1 application topically daily.  . montelukast (SINGULAIR) 10 MG tablet Take 1 tablet (10 mg total) by mouth at bedtime.  . nebivolol (BYSTOLIC) 5 MG tablet Take 1 tablet (5 mg total) by mouth daily.  [name] olmesartan-hydrochlorothiazide ([name] HCT) 40-25 MG tablet Take 1 tablet by mouth daily.  . ondansetron (ZOFRAN ODT) 4 MG disintegrating tablet Take 1 tablet (4 mg total) by mouth every 8 (eight) hours as needed for nausea or vomiting.  . Probiotic Product (PROBIOTIC & ACIDOPHILUS EX ST) CAPS Take 1 capsule by mouth 2 (two) times daily. (Patient taking differently: Take 1 capsule by mouth daily. )  . triamcinolone ointment (KENALOG) 0.5 % APPLY TO AFFECTED AREA TWICE A DAY   Facility-Administered Encounter Medications as of 07/29/2020  Medication  . triamcinolone acetonide (KENALOG) 10 MG/ML injection 10 mg    Current Diagnosis: Patient Active Problem List   Diagnosis Date Noted  . Gastroenteritis 05/13/2020  . Rosacea 04/30/2020  . Bilateral primary osteoarthritis of knee 04/30/2020  . Neck pain on left side 08/03/2018  . Atrophic vaginitis 02/23/2018  . Osteopenia 02/23/2018  . Urinary incontinence 02/23/2018  . Vasomotor rhinitis 07/26/2017  . Anxiety 02/01/2017  . Renal cyst 07/23/2016  . Cervical disc disorder with radiculopathy of cervical region 03/13/2016  . B12 deficiency   . Cataract 02/25/2014  . Glaucoma suspect 02/25/2014  . Lumbar spinal stenosis 03/07/2013  . Ejection fraction   . Syncope   .  Severe obesity (BMI >= 40) ([place]) 09/14/2010  . Essential hypertension 09/14/2010  . GERD 08/22/2009  . Pulmonary sarcoidosis ([place]) 06/30/2009  . Diabetes mellitus ([place]) 06/30/2009  . OSA on CPAP 06/30/2009  . Upper airway cough syndrome 06/30/2009  . Mild intermittent asthma 06/30/2009    Goals Addressed   None     Follow-Up:  Coordination of Enhanced Pharmacy Services   Reviewed chart for medication changes ahead of medication coordination call.  No OVs,  Consults, or hospital visits since last care coordination call/Pharmacist visit. No medication changes indicated.  BP Readings from Last 3 Encounters:  04/30/20 126/68  09/20/19 118/76  09/19/18 118/68    Lab Results  Component Value Date   HGBA1C 5.2 04/30/2020     Patient obtains medications through Vials  90 Days   Last adherence delivery included:   Gabapentin 100mg   Famotidine 20mg  Dexilant 60mg     Patient is due for next adherence delivery on: 07/31/2020 Called patient and reviewed medications and coordinated delivery.  This delivery to include:  Bystolic 5mg  daily Olmesartan 40mg -Hydrochlorothiazide 25mg  daily Victoza injection 18mg  /12mL injection 0.73mL Montelukast 10mg  daily Albuterol Sulfate 2.5mg  / 3MI 0.083% Solution  Metronidazole0.75% Lotion  Fluconazole 150mg  daily  Bd Nano 2nd Gen Pen needle 32 Gauge X 5/32  The patient needs a refill on Montelukast 10mg  prescribed by Dr. Billey Gosling the patients PCP. A message has been sent to CPP Charlene Brooke  to request new RX order.   Confirmed delivery date of 07/31/2020, advised patient that pharmacy will contact them the morning of delivery.  Rosendo Gros, [place] Pharmacist Assistant  [phone]

## 2020-08-06 ENCOUNTER — Other Ambulatory Visit: Payer: Self-pay | Admitting: Internal Medicine

## 2020-08-13 ENCOUNTER — Ambulatory Visit: Payer: Medicare Other | Admitting: Pulmonary Disease

## 2020-08-14 ENCOUNTER — Other Ambulatory Visit (HOSPITAL_COMMUNITY)
Admission: RE | Admit: 2020-08-14 | Discharge: 2020-08-14 | Disposition: A | Discharge status: Home / Self Care | Payer: Medicare Other | Source: Ambulatory Visit | Attending: Obstetrics and Gynecology | Admitting: Obstetrics and Gynecology

## 2020-08-14 ENCOUNTER — Ambulatory Visit (INDEPENDENT_AMBULATORY_CARE_PROVIDER_SITE_OTHER): Payer: Medicare Other | Admitting: Obstetrics and Gynecology

## 2020-08-14 ENCOUNTER — Encounter: Payer: Self-pay | Admitting: Obstetrics and Gynecology

## 2020-08-14 ENCOUNTER — Other Ambulatory Visit: Payer: Self-pay

## 2020-08-14 VITALS — BP 118/77 | HR 92 | Ht 64.0 in | Wt 249.7 lb

## 2020-08-14 DIAGNOSIS — Z01419 Encounter for gynecological examination (general) (routine) without abnormal findings: Secondary | ICD-10-CM

## 2020-08-14 DIAGNOSIS — Z124 Encounter for screening for malignant neoplasm of cervix: Secondary | ICD-10-CM | POA: Diagnosis not present

## 2020-08-14 DIAGNOSIS — Z6841 Body Mass Index (BMI) 40.0 and over, adult: Secondary | ICD-10-CM

## 2020-08-14 MED ORDER — ESTRADIOL 0.1 MG/GM VA CREA
2.0000 g | TOPICAL_CREAM | Freq: Every day | VAGINAL | 2 refills | Status: DC
Start: 2020-08-14 — End: 2020-08-18

## 2020-08-14 NOTE — Progress Notes (Signed)
Pt is here as new patient for initial visit. Pt reports complaints of weight gain within the last year. Last MMG 05/2020, normal. Colonoscopy is due for next year, pt reports they have always been normal.

## 2020-08-14 NOTE — Progress Notes (Signed)
Subjective:     Cynthia Kirk is a 69 y.o. female postmenopausal with BMI 42 who is here for a comprehensive physical exam. The patient reports some vaginal dryness. She is sexually active with her husband and reports occasional dyspareunia due to dryness. She also reports inability to lose weight over the past year. Patient is without any other complaints. She denies postmenopausal vaginal bleeding or urinary incontinence. Patient has a history of abnormal pap smear with her last normal pap being in 2017 (no recent pap smears since).  Past Medical History:  Diagnosis Date  . Abnormal Pap smear 2008  . Adenomatous colon polyp   . Allergic rhinitis, cause unspecified   . Anxiety   . ASCUS with positive high risk HPV   . Blood transfusion 1976   Due to ectopic pregnancy  . Chronic back pain   . Ejection fraction    EF 45-50%, echo, October 27, 2011  . Endometrial mass 2011  . Esophageal reflux   . Fibroid   . H/O Clostridium difficile infection 03/2010  . H/O menorrhagia 2011  . Herpes simplex type 2 infection complicating pregnancy   . Hiatal hernia   . Iron deficiency anemia   . Morbid obesity ([place]) 2010  . Obstructive sleep apnea (adult) (pediatric)    cpap  . Panic attacks   . Plantar fasciitis   . Pneumonia 2005; 2007; 2009; 2011  . Sarcoidosis 1996   @ [employer]  . Simple endometrial hyperplasia 2011  . Spinal stenosis, lumbar    s/p decompression laminectomy 09/2013  . Syncope    ?? Syncope ??  . Type 2 diabetes, diet controlled ([place])   . Unspecified asthma(493.90)   . Urinary incontinence, mixed 2011  . VIN II (vulvar intraepithelial neoplasia II)    Past Surgical History:  Procedure Laterality Date  . COLPOSCOPY VULVA W/ BIOPSY     "I've had maybe 3" (09/05/2013)  . DILATION AND CURETTAGE OF UTERUS  2007  . [place]; ~ 1978  . HYSTEROSCOPY DIAGNOSTIC  2011  . LUMBAR LAMINECTOMY/DECOMPRESSION MICRODISCECTOMY N/A 09/05/2013   Procedure: Thoracic  eleven-twelve Thoracic Laminectomy;  Surgeon: Eustace Moore, MD;  Location: [place] [facility];  Service: Neurosurgery;  Laterality: N/A;  Thoracic eleven-twelve Thoracic Laminectomy  . MAXIMUM ACCESS (MAS)POSTERIOR LUMBAR INTERBODY FUSION (PLIF) 1 LEVEL N/A 09/05/2013   Procedure: Lumbar four-five Maximum Access Surgery  Posterior lumbar interbody fusion;  Surgeon: Eustace Moore, MD;  Location: [place] [facility];  Service: Neurosurgery;  Laterality: N/A;  Lumbar four-five Maximum Access Surgery  Posterior lumbar interbody fusion  . POSTERIOR LAMINECTOMY / DECOMPRESSION LUMBAR SPINE  09/05/2013  . POSTERIOR LUMBAR FUSION  09/05/2013  . THORACIC LAMINECTOMY  09/05/2013  . URETHRAL SLING  2011  . VULVECTOMY  ~ 2007   "for cancerous cells" (09/05/2013)   Family History  Problem Relation Age of Onset  . Heart attack Father   . Hypertension Father   . Diabetes Father   . Seizures Father   . Migraines Father   . Kidney disease Sister   . Osteoporosis Sister   . Heart attack Sister   . Cancer Paternal Uncle   . Asthma Daughter   . Asthma Son   . Osteoporosis Sister   . Lung cancer Maternal Uncle   . Kidney disease Other   . Diabetes Other   . High Cholesterol Brother        x2    Social History   Socioeconomic History  .  Marital status: Married    Spouse name: Not on file  . Number of children: 2  . Years of education: PHD  . Highest education level: Not on file  Occupational History  . Occupation: retired    Fish farm manager: UNEMPLOYED  Tobacco Use  . Smoking status: Never Smoker  . Smokeless tobacco: Never Used  Vaping Use  . Vaping Use: Never used  Substance and Sexual Activity  . Alcohol use: Not Currently    Comment: 2018 - very occasionally  . Drug use: No  . Sexual activity: Yes    Birth control/protection: Post-menopausal  Other Topics Concern  . Not on file  Social History Narrative   Pt lives at home with her spouse.   She does not use caffeine.   Social Determinants of Health    Financial Resource Strain: Low Risk   . Difficulty of Paying Living Expenses: Not hard at all  Food Insecurity:   . Worried About Charity fundraiser in the Last Year: Not on file  . Ran Out of Food in the Last Year: Not on file  Transportation Needs:   . Lack of Transportation (Medical): Not on file  . Lack of Transportation (Non-Medical): Not on file  Physical Activity:   . Days of Exercise per Week: Not on file  . Minutes of Exercise per Session: Not on file  Stress:   . Feeling of Stress : Not on file  Social Connections:   . Frequency of Communication with Friends and Family: Not on file  . Frequency of Social Gatherings with Friends and Family: Not on file  . Attends Religious Services: Not on file  . Active Member of Clubs or Organizations: Not on file  . Attends Archivist Meetings: Not on file  . Marital Status: Not on file  Intimate Partner Violence:   . Fear of Current or Ex-Partner: Not on file  . Emotionally Abused: Not on file  . Physically Abused: Not on file  . Sexually Abused: Not on file   Health Maintenance  Topic Date Due  . MAMMOGRAM  04/01/2016  . FOOT EXAM  02/24/2019  . OPHTHALMOLOGY EXAM  07/06/2019  . INFLUENZA VACCINE  07/06/2020  . HEMOGLOBIN A1C  10/31/2020  . DEXA SCAN  08/31/2021  . COLONOSCOPY  09/29/2021  . TETANUS/TDAP  07/08/2025  . COVID-19 Vaccine  Completed  . Hepatitis C Screening  Completed  . PNA vac Low Risk Adult  Completed       Review of Systems Pertinent items noted in HPI and remainder of comprehensive ROS otherwise negative.   Objective:  Blood pressure 118/77, pulse 92, height 5\' 4"  (1.626 m), weight 249 lb 11.2 oz (113.3 kg), last menstrual period 02/14/2014.     GENERAL: Well-developed, well-nourished female in no acute distress.  HEENT: Normocephalic, atraumatic. Sclerae anicteric.  NECK: Supple. Normal thyroid.  LUNGS: Clear to auscultation bilaterally.  HEART: Regular rate and rhythm. BREASTS:  Symmetric in size. No palpable masses or lymphadenopathy, skin changes, or nipple drainage. ABDOMEN: Soft, nontender, nondistended. No organomegaly. PELVIC: Normal external female genitalia. Vagina is pale and atrophic.  Normal discharge. Normal appearing cervix. Uterus is normal in size. No adnexal mass or tenderness. EXTREMITIES: No cyanosis, clubbing, or edema, 2+ distal pulses.    Assessment:    Healthy female exam.      Plan:    Patient with normal mammogram 06/2020 Pap smear collected as patient has a history of abnormal pap smear in 2016 Patient will  be contacted with abnormal results Rx estrace cream provided for vaginal dryness Patient referred to nutritionist to assist with weight loss See After Visit Summary for Counseling Recommendations

## 2020-08-17 ENCOUNTER — Other Ambulatory Visit: Payer: Self-pay | Admitting: Gastroenterology

## 2020-08-18 ENCOUNTER — Other Ambulatory Visit: Payer: Self-pay

## 2020-08-18 LAB — CYTOLOGY - PAP: Diagnosis: NEGATIVE

## 2020-08-18 MED ORDER — ESTRADIOL 0.1 MG/GM VA CREA
2.0000 g | TOPICAL_CREAM | Freq: Every day | VAGINAL | 2 refills | Status: DC
Start: 2020-08-18 — End: 2022-02-09

## 2020-08-18 NOTE — Telephone Encounter (Signed)
Patient states her medication estradiol was never called into her pharmacy. I will attempt to resend to her pharmacy of choice.

## 2020-08-19 ENCOUNTER — Ambulatory Visit: Payer: Medicare Other | Admitting: Dermatology

## 2020-08-21 ENCOUNTER — Telehealth: Payer: Self-pay | Admitting: Pharmacist

## 2020-08-21 NOTE — Progress Notes (Signed)
Chronic Care Management Pharmacy Assistant   Name: Cynthia Kirk  MRN: [id number] DOB: May 02, 1951  Reason for Encounter: Medication Review  Patient Questions:  1.  Have you seen any other providers since your last visit? No  2.  Any changes in your medicines or health? No   PCP : [name], MD  Allergies:   Allergies  Allergen Reactions  . Aspirin Hives, Shortness Of Breath and Other (See Comments)    Passed out  . Ibuprofen Nausea And Vomiting and Other (See Comments)    Upsets stomach  . Latex Rash    Medications: Outpatient Encounter Medications as of 08/21/2020  Medication Sig  . albuterol (PROVENTIL) (2.5 MG/3ML) 0.083% nebulizer solution TAKE 3ML(2.5MG ) BY NEBULIZATION EVERY 6 HOURS AS NEEDED FOR SHORTNESS OF BREATH  . albuterol (VENTOLIN HFA) 108 (90 Base) MCG/ACT inhaler INHALE 2 PUFF&nbsp;&nbsp;BY MOUTH&nbsp;&nbsp;EVERY 6 HOUR as NEEDED FOR WHEEZING ,SHORTNESS OF BREATH  . ALPRAZolam (XANAX) 0.5 MG tablet TAKE 1 TABLET BY MOUTH EVERY DAY AS NEEDED FOR ANXIETY  . BD PEN NEEDLE NANO U/F 32G X 4 MM MISC USE 3 TIMES DAILY AS DIRECTED  . cyclobenzaprine (FLEXERIL) 10 MG tablet TAKE 1/2 TO 1 TABLET 3 TIMES DAILY AS NEEDED FOR MUSCLE SPASMS.  [name] dexlansoprazole (DEXILANT) 60 MG capsule Take 1 capsule (60 mg total) by mouth daily before breakfast.  . estradiol (ESTRACE VAGINAL) 0.1 MG/GM vaginal cream Place 2 g vaginally daily.  . famotidine (PEPCID) 20 MG tablet Take 2 tablets (40 mg total) by mouth 2 (two) times daily.  . famotidine (PEPCID) 40 MG tablet TAKE 1 TABLET BY MOUTH TWICE A DAY  . fluticasone (FLOVENT HFA) 44 MCG/ACT inhaler Inhale 2 puffs into the lungs 2 (two) times daily.  [name] gabapentin (NEURONTIN) 100 MG capsule TAKE 1 CAPSULE BY MOUTH AT BEDTIME  . hydroquinone 4 % cream Apply 1 application topically 2 (two) times daily.  . Ibuprofen-Famotidine (DUEXIS) 800-26.6 MG TABS Duexis 800 mg-26.6 mg tablet  . ipratropium (ATROVENT) 0.06 % nasal spray Place 2  sprays into both nostrils 2 (two) times daily.  [name] METRONIDAZOLE, TOPICAL, 0.75 % LOTN Apply 1 application topically daily.  . montelukast (SINGULAIR) 10 MG tablet Take 1 tablet (10 mg total) by mouth at bedtime.  . nebivolol (BYSTOLIC) 5 MG tablet Take 1 tablet (5 mg total) by mouth daily.  [name] olmesartan-hydrochlorothiazide ([name] HCT) 40-25 MG tablet Take 1 tablet by mouth daily.  . ondansetron (ZOFRAN ODT) 4 MG disintegrating tablet Take 1 tablet (4 mg total) by mouth every 8 (eight) hours as needed for nausea or vomiting.  . Probiotic Product (PROBIOTIC & ACIDOPHILUS EX ST) CAPS Take 1 capsule by mouth 2 (two) times daily. (Patient taking differently: Take 1 capsule by mouth daily. )  . triamcinolone ointment (KENALOG) 0.5 % APPLY TO AFFECTED AREA TWICE A DAY  . VICTOZA 18 MG/3ML SOPN INJECT 0.3 ML SUB Q DAILY   Facility-Administered Encounter Medications as of 08/21/2020  Medication  . triamcinolone acetonide (KENALOG) 10 MG/ML injection 10 mg    Current Diagnosis: Patient Active Problem List   Diagnosis Date Noted  . Gastroenteritis 05/13/2020  . Rosacea 04/30/2020  . Bilateral primary osteoarthritis of knee 04/30/2020  . Neck pain on left side 08/03/2018  . Atrophic vaginitis 02/23/2018  . Osteopenia 02/23/2018  . Urinary incontinence 02/23/2018  . Vasomotor rhinitis 07/26/2017  . Anxiety 02/01/2017  . Renal cyst 07/23/2016  . Cervical disc disorder with radiculopathy of cervical region 03/13/2016  . B12  deficiency   . Cataract 02/25/2014  . Glaucoma suspect 02/25/2014  . Lumbar spinal stenosis 03/07/2013  . Ejection fraction   . Syncope   . Severe obesity (BMI >= 40) ([place]) 09/14/2010  . Essential hypertension 09/14/2010  . GERD 08/22/2009  . Pulmonary sarcoidosis ([place]) 06/30/2009  . Diabetes mellitus ([place]) 06/30/2009  . OSA on CPAP 06/30/2009  . Upper airway cough syndrome 06/30/2009  . Mild intermittent asthma 06/30/2009    Goals Addressed   None      Follow-Up:  Pharmacist Review   Reviewed chart for medication changes ahead of medication coordination call.  No OVs, Consults, or hospital visits since last care coordination call/Pharmacist visit. (If appropriate, list visit date, provider name)  No medication changes indicated OR if recent visit, treatment plan here.  BP Readings from Last 3 Encounters:  08/14/20 118/77  04/30/20 126/68  09/20/19 118/76    Lab Results  Component Value Date   HGBA1C 5.2 04/30/2020     Patient obtains medications through Vials  90 Days   Last adherence delivery included: Bystolic 5mg , Olmesartan 40mg -Hydrochlorothiazide 25mg    Patient is due for next adherence delivery on: 08/28/2020 Called patient and reviewed medications and coordinated delivery.  Patient declined refills on medication due to [place] of [place] she [facility]  [phone] [address].   Rosendo Gros, [place] Pharmacist Assistant  [phone]

## 2020-09-03 ENCOUNTER — Telehealth: Payer: Medicare Other

## 2020-09-03 NOTE — Progress Notes (Signed)
Please void note, open in error.  

## 2020-09-11 ENCOUNTER — Ambulatory Visit: Payer: Medicare Other | Admitting: Skilled Nursing Facility1

## 2020-09-13 ENCOUNTER — Other Ambulatory Visit: Payer: Self-pay | Admitting: Sports Medicine

## 2020-09-15 NOTE — Telephone Encounter (Signed)
Please Advise

## 2020-09-18 ENCOUNTER — Other Ambulatory Visit: Payer: Self-pay | Admitting: Internal Medicine

## 2020-10-04 ENCOUNTER — Ambulatory Visit: Payer: Medicare Other

## 2020-10-07 ENCOUNTER — Other Ambulatory Visit: Payer: Self-pay

## 2020-10-07 ENCOUNTER — Encounter: Payer: Self-pay | Admitting: Pulmonary Disease

## 2020-10-07 ENCOUNTER — Ambulatory Visit (INDEPENDENT_AMBULATORY_CARE_PROVIDER_SITE_OTHER): Payer: Medicare Other | Admitting: Pulmonary Disease

## 2020-10-07 VITALS — BP 110/64 | HR 80 | Temp 97.5°F | Ht 64.5 in | Wt 253.6 lb

## 2020-10-07 DIAGNOSIS — E669 Obesity, unspecified: Secondary | ICD-10-CM

## 2020-10-07 DIAGNOSIS — G4733 Obstructive sleep apnea (adult) (pediatric): Secondary | ICD-10-CM

## 2020-10-07 DIAGNOSIS — G4730 Sleep apnea, unspecified: Secondary | ICD-10-CM | POA: Diagnosis not present

## 2020-10-07 DIAGNOSIS — J310 Chronic rhinitis: Secondary | ICD-10-CM

## 2020-10-07 DIAGNOSIS — Z9989 Dependence on other enabling machines and devices: Secondary | ICD-10-CM | POA: Diagnosis not present

## 2020-10-07 DIAGNOSIS — D860 Sarcoidosis of lung: Secondary | ICD-10-CM | POA: Diagnosis not present

## 2020-10-07 DIAGNOSIS — J45998 Other asthma: Secondary | ICD-10-CM | POA: Diagnosis not present

## 2020-10-07 MED ORDER — AZELASTINE HCL 0.1 % NA SOLN: 1.0000 | Freq: Two times a day (BID) | NASAL | 12 refills | Status: DC

## 2020-10-07 NOTE — Progress Notes (Signed)
[facility]  Chief Complaint  Patient presents with   Follow-up    yearly f/u - Some SOB on exertion - Stopped wearing cpapa few months ago - Cpap was causing sneezing attacks    Constitutional:  BP 110/64    Pulse 80    Temp (!) 97.5 F (36.4 C) (Oral)    Ht 5' 4.5" (1.638 m)    Wt 253 lb 9.6 oz (115 kg)    LMP 02/14/2014    SpO2 100%    BMI 42.86 kg/m   Past Medical History:  DM, Spinal stenosis, PNA, Anemia, HH, HSV, C diff, GERD, Anxiety, Colon polyp  Past Surgical History:  Her  has a past surgical history that includes Vulvectomy (~ 2007); Ectopic pregnancy surgery (1976; ~ 1978); Urethral sling (2011); Dilation and curettage of uterus (2007); Posterior laminectomy / decompression lumbar spine (09/05/2013); Thoracic laminectomy (09/05/2013); Posterior lumbar fusion (09/05/2013); Hysteroscopy diagnostic (2011); Colposcopy vulva w/ biopsy; Maximum access (mas)posterior lumbar interbody fusion (plif) 1 level (N/A, 09/05/2013); and Lumbar laminectomy/decompression microdiscectomy (N/A, 09/05/2013).  Brief Summary:  Cynthia Kirk is a 69 y.o. female with pulmonary sarcoidosis, asthma, and obstructive sleep apnea.      Subjective:   She hasn't been able to tolerate CPAP.  She has vasomotor rhinitis with constant nasal drainage.  She gets sneezing episodes in the morning after using CPAP.  She has a full face mask.  Feels pressure setting is okay.  Tried steroid nose sprays before, but these caused her to get a headache.    She gets occasional cough with clear sputum.  Feels inhalers for asthma are working well.  She has been feeling more fatigued during the day and napping more.    She is in the process of moving to [place], [place].  She plans to continue follow up in [place] for now.  Physical Exam:   Appearance - well kempt   ENMT - no sinus tenderness, no oral exudate, no LAN, Mallampati 3 airway, no stridor  Respiratory - equal  breath sounds bilaterally, no wheezing or rales  CV - s1s2 regular rate and rhythm, no murmurs  Ext - no clubbing, no edema  Skin - no rashes  Psych - normal mood and affect   Pulmonary testing:   Spirometry 06/30/09 >> FEV1 1.69(87%), FVC 2.17(83%), FEV1% 78  PFT 11/15/14 >> FEV1 1.76 (87%), FEV1% 77, TLC 3.19 (63%), DLCO 74%, no BD  PFT 12/27/17 >> FEV1 1.14 (59%), FEV1% 78, TLC 3.75 (74%), DLCO 61  Chest Imaging:   CT chest 07/17/14 >> upper lobe predominant BTX, GGO, nodularity no change since 2011  HRCT chest 12/20/17 >> upper lobe fibrosis  Sleep Tests:   PSG 05/28/14 >> AHI 5.8, SaO2 low 85%. REM AHI 29.3  ONO with CPAP 12/08/17 >>test time 2 hrs 26 min. Average SpO2 94%, low SpO2 81%. Spent 16.1 min with SpO2 <88%.  CPAP titration 02/21/18 >> CPAP 10 cm H2O; didn't need supplemental oxygen.  CPAP 07/15/19 to 08/13/19 >> used on 30 of 30 nights with average 8 hrs 47 min.  Average AHI 1.1 with CPAP 10 cm H2O  Cardiac Tests:   Echo 03/09/17 >> EF 55 to 60%  Social History:  She  reports that she has never smoked. She has never used smokeless tobacco. She reports previous alcohol use. She reports that she does not use drugs.  Family History:  Her family history includes Asthma in her daughter and son; Cancer in her  paternal uncle; Diabetes in her father and another family member; Heart attack in her father and sister; High Cholesterol in her brother; Hypertension in her father; Kidney disease in her sister and another family member; Lung cancer in her maternal uncle; Migraines in her father; Osteoporosis in her sister and sister; Seizures in her father.     Assessment/Plan:   Persistent asthma. - stable - continue flovent, singulair and prn albuterol - she will be moving to [place], [place] but plans to continue pulmonary/sleep medicine follow up with [facility] for now  Obstructive sleep apnea. - she has trouble with rhinitis from CPAP - will have her try using  azelastine nasal spray bid and resume CPAP therapy  - she uses [employer] for her DME - continue CPAP 10 cm H2O  Vasomotor rhinitis. - previously seen by Dr. Melida Quitter with ENT - continue atrovent nasal spray - she was intolerant of previous trials of nasal steroid sprays >> caused headache - explained how this is different than issues with CPAP rhinitis  Pulmonary sarcoidosis. - don't think this is active at present - monitor clinically  Time Spent Involved in Patient Care on Day of Examination:  42 minutes  Follow up:  Patient Instructions  Azelastine nasal spray 1 spray in each nostril twice per day  Try restarting CPAP after you start using azelastine  Follow up in 6 months   Medication List:   Allergies as of 10/07/2020      Reactions   Aspirin Hives, Shortness Of Breath, Other (See Comments)   Passed out   Ibuprofen Nausea And Vomiting, Other (See Comments)   Upsets stomach   Latex Rash      Medication List       Accurate as of October 07, 2020 12:00 PM. If you have any questions, ask your nurse or doctor.        albuterol 108 (90 Base) MCG/ACT inhaler Commonly known as: Ventolin HFA INHALE 2 PUFF  BY MOUTH  EVERY 6 HOUR as NEEDED FOR WHEEZING ,SHORTNESS OF BREATH   albuterol (2.5 MG/3ML) 0.083% nebulizer solution Commonly known as: PROVENTIL TAKE 3ML(2.5MG ) BY NEBULIZATION EVERY 6 HOURS AS NEEDED FOR SHORTNESS OF BREATH   ALPRAZolam 0.5 MG tablet Commonly known as: XANAX TAKE 1 TABLET BY MOUTH EVERY DAY AS NEEDED FOR ANXIETY   azelastine 0.1 % nasal spray Commonly known as: ASTELIN Place 1 spray into both nostrils 2 (two) times daily. Use in each nostril as directed Started by: Chesley Mires, MD   BD Pen Needle Nano U/F 32G X 4 MM Misc Generic drug: Insulin Pen Needle USE 3 TIMES DAILY AS DIRECTED   cyclobenzaprine 10 MG tablet Commonly known as: FLEXERIL TAKE 1/2 TO 1 TABLET 3 TIMES DAILY AS NEEDED FOR MUSCLE SPASMS.   Dexilant 60 MG  capsule Generic drug: dexlansoprazole Take 1 capsule (60 mg total) by mouth daily before breakfast.   Duexis 800-26.6 MG Tabs Generic drug: Ibuprofen-Famotidine Duexis 800 mg-26.6 mg tablet   estradiol 0.1 MG/GM vaginal cream Commonly known as: ESTRACE VAGINAL Place 2 g vaginally daily.   famotidine 40 MG tablet Commonly known as: PEPCID TAKE 1 TABLET BY MOUTH TWICE A DAY What changed: Another medication with the same name was removed. Continue taking this medication, and follow the directions you see here. Changed by: Chesley Mires, MD   Flovent HFA 44 MCG/ACT inhaler Generic drug: fluticasone Inhale 2 puffs into the lungs 2 (two) times daily.   gabapentin 100 MG capsule Commonly known as: NEURONTIN  TAKE 1 CAPSULE AT BEDTIME   hydroquinone 4 % cream Apply 1 application topically 2 (two) times daily.   ipratropium 0.06 % nasal spray Commonly known as: ATROVENT Place 2 sprays into both nostrils 2 (two) times daily.   METRONIDAZOLE (TOPICAL) 0.75 % Lotn Apply 1 application topically daily.   montelukast 10 MG tablet Commonly known as: SINGULAIR Take 1 tablet (10 mg total) by mouth at bedtime.   nebivolol 5 MG tablet Commonly known as: Bystolic Take 1 tablet (5 mg total) by mouth daily.   olmesartan-hydrochlorothiazide 40-25 MG tablet Commonly known as: [name] HCT Take 1 tablet by mouth daily.   ondansetron 4 MG disintegrating tablet Commonly known as: Zofran ODT Take 1 tablet (4 mg total) by mouth every 8 (eight) hours as needed for nausea or vomiting.   Probiotic & Acidophilus Ex St Caps Take 1 capsule by mouth 2 (two) times daily.   triamcinolone ointment 0.5 % Commonly known as: KENALOG APPLY TO AFFECTED AREA TWICE A DAY   Victoza 18 MG/3ML Sopn Generic drug: liraglutide INJECT 0.3 ML SUBCUTANEAOUSLY DAILY       Signature:  Chesley Mires, MD [place] Pager - [phone] 10/07/2020, 12:00 PM

## 2020-10-07 NOTE — Patient Instructions (Signed)
Azelastine nasal spray 1 spray in each nostril twice per day  Try restarting CPAP after you start using azelastine  Follow up in 6 months

## 2020-10-13 NOTE — Assessment & Plan Note (Addendum)
Chronic With GERD and obesity Controlled No longer taking victoza because she gained weight Encouraged regular exercise Diabetic diet

## 2020-10-13 NOTE — Progress Notes (Signed)
Virtual Visit via Video Note  I connected with Cynthia Kirk on 10/14/20 at  1:15 PM EST by a video enabled telemedicine application and verified that I am speaking with the correct person using two identifiers.   I discussed the limitations of evaluation and management by telemedicine and the availability of in person appointments. The patient expressed understanding and agreed to proceed.  Present for the visit:  Myself, Dr Billey Gosling, [name].  The patient is currently at home and I am in the office.    No referring provider.    History of Present Illness: She is here for follow up of her chronic medical conditions, htn, DM, GERD, anxiety, lumbar spinal stenosis, b/l Knee OA, obesity,asthma.    She is taking all of her medications as prescribed.    She is not exercising regularly.   She feels she is active.  She has gained weight and stopped victoza.    She has a cold symptoms.  It just started a few days.  She has dry cough, congestion, fatigue, achiness.      Review of Systems  Constitutional: Positive for fever (subjective).  HENT: Positive for congestion, ear pain and sore throat. Negative for sinus pain.   Respiratory: Positive for cough (dry at night) and shortness of breath (with going up steps, if she does too much). Negative for wheezing.   Cardiovascular: Positive for leg swelling (chronic). Negative for chest pain and palpitations.  Musculoskeletal: Positive for back pain and myalgias.  Neurological: Positive for headaches. Negative for dizziness.     Social History   Socioeconomic History  . Marital status: Married    Spouse name: Not on file  . Number of children: 2  . Years of education: PHD  . Highest education level: Not on file  Occupational History  . Occupation: retired    Fish farm manager: UNEMPLOYED  Tobacco Use  . Smoking status: Never Smoker  . Smokeless tobacco: Never Used  Vaping Use  . Vaping Use: Never used  Substance and Sexual Activity  .  Alcohol use: Not Currently    Comment: 2018 - very occasionally  . Drug use: No  . Sexual activity: Yes    Birth control/protection: Post-menopausal  Other Topics Concern  . Not on file  Social History Narrative   Pt lives at home with her spouse.   She does not use caffeine.   Social Determinants of Health   Financial Resource Strain: Low Risk   . Difficulty of Paying Living Expenses: Not hard at all  Food Insecurity:   . Worried About Charity fundraiser in the Last Year: Not on file  . Ran Out of Food in the Last Year: Not on file  Transportation Needs:   . Lack of Transportation (Medical): Not on file  . Lack of Transportation (Non-Medical): Not on file  Physical Activity:   . Days of Exercise per Week: Not on file  . Minutes of Exercise per Session: Not on file  Stress:   . Feeling of Stress : Not on file  Social Connections:   . Frequency of Communication with Friends and Family: Not on file  . Frequency of Social Gatherings with Friends and Family: Not on file  . Attends Religious Services: Not on file  . Active Member of Clubs or Organizations: Not on file  . Attends Archivist Meetings: Not on file  . Marital Status: Not on file     Observations/Objective: Appears well in NAD Sounds congested  Breathing normally Skin appears warm and day Mood and affect normal   Assessment and Plan:  See Problem List for Assessment and Plan of chronic medical problems.   Follow Up Instructions:    I discussed the assessment and treatment plan with the patient. The patient was provided an opportunity to ask questions and all were answered. The patient agreed with the plan and demonstrated an understanding of the instructions.   The patient was advised to call back or seek an in-person evaluation if the symptoms worsen or if the condition fails to improve as anticipated.     [name], MD

## 2020-10-14 ENCOUNTER — Encounter: Payer: Self-pay | Admitting: Internal Medicine

## 2020-10-14 ENCOUNTER — Telehealth (INDEPENDENT_AMBULATORY_CARE_PROVIDER_SITE_OTHER): Payer: Medicare Other | Admitting: Internal Medicine

## 2020-10-14 DIAGNOSIS — J4520 Mild intermittent asthma, uncomplicated: Secondary | ICD-10-CM | POA: Diagnosis not present

## 2020-10-14 DIAGNOSIS — K219 Gastro-esophageal reflux disease without esophagitis: Secondary | ICD-10-CM

## 2020-10-14 DIAGNOSIS — J069 Acute upper respiratory infection, unspecified: Secondary | ICD-10-CM | POA: Diagnosis not present

## 2020-10-14 DIAGNOSIS — I10 Essential (primary) hypertension: Secondary | ICD-10-CM

## 2020-10-14 DIAGNOSIS — M48061 Spinal stenosis, lumbar region without neurogenic claudication: Secondary | ICD-10-CM

## 2020-10-14 DIAGNOSIS — F419 Anxiety disorder, unspecified: Secondary | ICD-10-CM

## 2020-10-14 DIAGNOSIS — E1169 Type 2 diabetes mellitus with other specified complication: Secondary | ICD-10-CM

## 2020-10-14 DIAGNOSIS — M62830 Muscle spasm of back: Secondary | ICD-10-CM

## 2020-10-14 MED ORDER — CYCLOBENZAPRINE HCL 10 MG PO TABS: ORAL_TABLET | ORAL | 0 refills | Status: DC

## 2020-10-14 MED ORDER — HYDROQUINONE 4 % EX CREA
1.0000 | TOPICAL_CREAM | Freq: Two times a day (BID) | CUTANEOUS | 0 refills | Status: DC
Start: 2020-10-14 — End: 2021-02-25

## 2020-10-14 MED ORDER — HYDROCOD POLST-CPM POLST ER 10-8 MG/5ML PO SUER: 5.0000 mL | Freq: Two times a day (BID) | ORAL | 0 refills | Status: DC | PRN

## 2020-10-14 MED ORDER — AMOXICILLIN-POT CLAVULANATE 875-125 MG PO TABS: 1.0000 | ORAL_TABLET | Freq: Two times a day (BID) | ORAL | 0 refills | Status: DC

## 2020-10-14 NOTE — Assessment & Plan Note (Signed)
Chronic Lower back spasms, chronic lower back pain Restart flexeril 5-10 mg three times a day as needed Will work on weight loss

## 2020-10-14 NOTE — Assessment & Plan Note (Signed)
Chronic Controlled, stable Continue xanax 0.5 mg daily as needed 

## 2020-10-14 NOTE — Assessment & Plan Note (Signed)
Chronic Having increased back pain Restart flexeril 5-10 mg three times a day as needed Gabapentin 100 mg nightly

## 2020-10-14 NOTE — Assessment & Plan Note (Signed)
Chronic BP well controlled Continue bystolic 5 mg daily, benicar-hctz 40-25 mg daily  BP Readings from Last 3 Encounters:  10/07/20 110/64  08/14/20 118/77  04/30/20 126/68

## 2020-10-14 NOTE — Assessment & Plan Note (Addendum)
Chronic Controlled Uses flovent twice daily and albuterol prn singulair nightly Continue above

## 2020-10-14 NOTE — Assessment & Plan Note (Signed)
Chronic Has gained weight stopped victoza since she gained weight encouraged regular exercise

## 2020-10-14 NOTE — Assessment & Plan Note (Signed)
Acute ? Bacterial or viral - difficult to tell with virtual visit Will start augmentin 875-125 mg BID x 10 day Use inhalers, otc cold meds tussionex cough syrup prn

## 2020-10-14 NOTE — Assessment & Plan Note (Signed)
Chronic GERD controlled Continue dexilant 60 mg daily and pepcid 40 mg BID

## 2020-10-23 ENCOUNTER — Telehealth: Payer: Self-pay | Admitting: Internal Medicine

## 2020-10-23 NOTE — Telephone Encounter (Signed)
  Amoxicillin-Pot Clavulanate 875-125 MG   Hydrocod Polst-Chlorphen Polst 10-8 MG/5ML Patient was prescribed this medication on the 9th and she stopped taking it on the 14th because it was causing her to have diarrhea as well as develop a yeast infection and she is still having it today so she isn't sure what she should do.

## 2020-10-24 MED ORDER — FLUCONAZOLE 150 MG PO TABS: 150.0000 mg | ORAL_TABLET | Freq: Once | ORAL | 0 refills | Status: AC

## 2020-10-24 MED ORDER — FLUCONAZOLE 150 MG PO TABS: 150.0000 mg | ORAL_TABLET | Freq: Once | ORAL | 0 refills | Status: DC

## 2020-10-24 NOTE — Telephone Encounter (Signed)
Diflucan sent to pharmacy.

## 2020-10-24 NOTE — Telephone Encounter (Signed)
Spoke with patient and info given.  Mailed out script to her today for the hydrocod Polst-Chlorphen.

## 2020-10-24 NOTE — Addendum Note (Signed)
Addended by: [name] on: 10/24/2020 12:24 PM   Modules accepted: Orders

## 2020-10-24 NOTE — Telephone Encounter (Signed)
Sent in new rx for 2 pills.  If diarrhea continues she needs to let us know to check stool cultures to make sure it is not cdiff.  Can try imodium, but can not take this more than 1-2 days.  Should start probiotics

## 2020-10-27 ENCOUNTER — Other Ambulatory Visit: Payer: Self-pay | Admitting: Internal Medicine

## 2020-11-11 ENCOUNTER — Other Ambulatory Visit: Payer: Self-pay | Admitting: Internal Medicine

## 2020-11-11 DIAGNOSIS — Z6841 Body Mass Index (BMI) 40.0 and over, adult: Secondary | ICD-10-CM | POA: Diagnosis not present

## 2020-11-11 DIAGNOSIS — N951 Menopausal and female climacteric states: Secondary | ICD-10-CM | POA: Diagnosis not present

## 2020-11-11 DIAGNOSIS — M2559 Pain in other specified joint: Secondary | ICD-10-CM | POA: Diagnosis not present

## 2020-11-11 DIAGNOSIS — R5382 Chronic fatigue, unspecified: Secondary | ICD-10-CM | POA: Diagnosis not present

## 2020-11-11 DIAGNOSIS — N898 Other specified noninflammatory disorders of vagina: Secondary | ICD-10-CM | POA: Diagnosis not present

## 2020-11-12 ENCOUNTER — Telehealth: Payer: Self-pay

## 2020-11-12 MED ORDER — ALPRAZOLAM 0.5 MG PO TABS
ORAL_TABLET | ORAL | 0 refills | Status: DC
Start: 2020-11-12 — End: 2021-02-25

## 2020-11-12 NOTE — Telephone Encounter (Signed)
Sent xanax

## 2020-11-14 DIAGNOSIS — Z23 Encounter for immunization: Secondary | ICD-10-CM | POA: Diagnosis not present

## 2020-11-17 ENCOUNTER — Ambulatory Visit: Payer: Medicare Other

## 2020-11-18 ENCOUNTER — Other Ambulatory Visit: Payer: Self-pay | Admitting: Internal Medicine

## 2020-12-12 ENCOUNTER — Other Ambulatory Visit: Payer: Self-pay | Admitting: Internal Medicine

## 2020-12-23 ENCOUNTER — Ambulatory Visit: Payer: Medicare Other | Admitting: Gastroenterology

## 2020-12-23 ENCOUNTER — Telehealth: Payer: Self-pay | Admitting: Gastroenterology

## 2020-12-23 ENCOUNTER — Telehealth: Payer: Self-pay | Admitting: Pulmonary Disease

## 2020-12-23 MED ORDER — FAMOTIDINE 40 MG PO TABS: 40.0000 mg | ORAL_TABLET | Freq: Two times a day (BID) | ORAL | 0 refills | Status: DC

## 2020-12-23 MED ORDER — DEXLANSOPRAZOLE 60 MG PO CPDR: 60.0000 mg | DELAYED_RELEASE_CAPSULE | Freq: Every day | ORAL | 0 refills | Status: DC

## 2020-12-23 NOTE — Telephone Encounter (Signed)
Patient requesting refill on Famotidine and Dexilant

## 2020-12-23 NOTE — Telephone Encounter (Signed)
Patient rescheduled due to weather. Refills sent to the pharmacy until appt.

## 2020-12-23 NOTE — Telephone Encounter (Signed)
Spoke with Patient. Patient scheduled high dose flu vaccine 12/30/20 at 4pm.

## 2020-12-28 ENCOUNTER — Other Ambulatory Visit: Payer: Self-pay | Admitting: Pulmonary Disease

## 2020-12-28 ENCOUNTER — Other Ambulatory Visit: Payer: Self-pay | Admitting: Internal Medicine

## 2020-12-30 ENCOUNTER — Ambulatory Visit (INDEPENDENT_AMBULATORY_CARE_PROVIDER_SITE_OTHER): Payer: Medicare Other

## 2020-12-30 ENCOUNTER — Other Ambulatory Visit: Payer: Self-pay

## 2020-12-30 DIAGNOSIS — Z23 Encounter for immunization: Secondary | ICD-10-CM

## 2020-12-30 DIAGNOSIS — Z6841 Body Mass Index (BMI) 40.0 and over, adult: Secondary | ICD-10-CM | POA: Diagnosis not present

## 2020-12-30 DIAGNOSIS — R635 Abnormal weight gain: Secondary | ICD-10-CM | POA: Diagnosis not present

## 2020-12-30 DIAGNOSIS — E782 Mixed hyperlipidemia: Secondary | ICD-10-CM | POA: Diagnosis not present

## 2020-12-30 DIAGNOSIS — Z1331 Encounter for screening for depression: Secondary | ICD-10-CM | POA: Diagnosis not present

## 2020-12-30 DIAGNOSIS — N898 Other specified noninflammatory disorders of vagina: Secondary | ICD-10-CM | POA: Diagnosis not present

## 2020-12-30 DIAGNOSIS — M2559 Pain in other specified joint: Secondary | ICD-10-CM | POA: Diagnosis not present

## 2020-12-30 DIAGNOSIS — R748 Abnormal levels of other serum enzymes: Secondary | ICD-10-CM | POA: Diagnosis not present

## 2020-12-30 DIAGNOSIS — N951 Menopausal and female climacteric states: Secondary | ICD-10-CM | POA: Diagnosis not present

## 2020-12-30 DIAGNOSIS — E559 Vitamin D deficiency, unspecified: Secondary | ICD-10-CM | POA: Diagnosis not present

## 2021-01-14 ENCOUNTER — Other Ambulatory Visit: Payer: Self-pay | Admitting: Internal Medicine

## 2021-01-22 ENCOUNTER — Telehealth (INDEPENDENT_AMBULATORY_CARE_PROVIDER_SITE_OTHER): Payer: Medicare Other | Admitting: Family Medicine

## 2021-01-22 ENCOUNTER — Telehealth: Payer: Self-pay | Admitting: Pharmacist

## 2021-01-22 DIAGNOSIS — R6889 Other general symptoms and signs: Secondary | ICD-10-CM

## 2021-01-22 DIAGNOSIS — R109 Unspecified abdominal pain: Secondary | ICD-10-CM | POA: Diagnosis not present

## 2021-01-22 DIAGNOSIS — R5381 Other malaise: Secondary | ICD-10-CM

## 2021-01-22 DIAGNOSIS — R509 Fever, unspecified: Secondary | ICD-10-CM | POA: Diagnosis not present

## 2021-01-22 NOTE — Progress Notes (Signed)
Virtual Visit via Video Note  I connected with@  on 01/22/21 at  5:20 PM EST by a video enabled telemedicine application and verified that I am speaking with the correct person using two identifiers.  Location patient: home, [place] Location provider:work or home office Persons participating in the virtual visit: patient, provider  I discussed the limitations of evaluation and management by telemedicine and the availability of in person appointments. The patient expressed understanding and agreed to proceed.   HPI:  Acute telemedicine visit for : -Onset: Monday - but has had some back pain bilat for a week or two (which she thought was just her chronic back issues as her back brace helped it) -Symptoms include:  Sudden onset 3 days ago subjective fever, SOB and CP a few days ago, cough, nasal congestion, malaise, difuse body aches, nausea and vomiting, soft stool, urine was a different color after that, poor appetite -Denies: CP today, vomiting has stopped - but still feels very lethargic and wiped out and has persistent flank pain and now dark orange urine - she is worried about her liver and her kidneys -Has tried:drinking tomato juice -Pertinent past medical history: pulm sardoid, diabetes, HTN, ? Liver and kidney issues on labs recently per her report at her hormone clinic -COVID-19 vaccine status: fully vaccinated + booster; had flu shot  ROS: See pertinent positives and negatives per HPI.  Past Medical History:  Diagnosis Date  . Abnormal Pap smear 2008  . Adenomatous colon polyp   . Allergic rhinitis, cause unspecified   . Anxiety   . ASCUS with positive high risk HPV   . Blood transfusion 1976   Due to ectopic pregnancy  . Chronic back pain   . Ejection fraction    EF 45-50%, echo, October 27, 2011  . Endometrial mass 2011  . Esophageal reflux   . Fibroid   . H/O Clostridium difficile infection 03/2010  . H/O menorrhagia 2011  . Herpes simplex type 2 infection complicating  pregnancy   . Hiatal hernia   . Iron deficiency anemia   . Morbid obesity ([place]) 2010  . Obstructive sleep apnea (adult) (pediatric)    cpap  . Panic attacks   . Plantar fasciitis   . Pneumonia 2005; 2007; 2009; 2011  . Sarcoidosis 1996   @ [employer]  . Simple endometrial hyperplasia 2011  . Spinal stenosis, lumbar    s/p decompression laminectomy 09/2013  . Syncope    ?? Syncope ??  . Type 2 diabetes, diet controlled ([place])   . Unspecified asthma(493.90)   . Urinary incontinence, mixed 2011  . VIN II (vulvar intraepithelial neoplasia II)     Past Surgical History:  Procedure Laterality Date  . COLPOSCOPY VULVA W/ BIOPSY     "I've had maybe 3" (09/05/2013)  . DILATION AND CURETTAGE OF UTERUS  2007  . [place]; ~ 1978  . HYSTEROSCOPY DIAGNOSTIC  2011  . LUMBAR LAMINECTOMY/DECOMPRESSION MICRODISCECTOMY N/A 09/05/2013   Procedure: Thoracic eleven-twelve Thoracic Laminectomy;  Surgeon: Eustace Moore, MD;  Location: [facility];  Service: Neurosurgery;  Laterality: N/A;  Thoracic eleven-twelve Thoracic Laminectomy  . MAXIMUM ACCESS (MAS)POSTERIOR LUMBAR INTERBODY FUSION (PLIF) 1 LEVEL N/A 09/05/2013   Procedure: Lumbar four-five Maximum Access Surgery  Posterior lumbar interbody fusion;  Surgeon: Eustace Moore, MD;  Location: [facility];  Service: Neurosurgery;  Laterality: N/A;  Lumbar four-five Maximum Access Surgery  Posterior lumbar interbody fusion  . POSTERIOR LAMINECTOMY / DECOMPRESSION LUMBAR SPINE  09/05/2013  . POSTERIOR LUMBAR FUSION  09/05/2013  . THORACIC LAMINECTOMY  09/05/2013  . URETHRAL SLING  2011  . VULVECTOMY  ~ 2007   "for cancerous cells" (09/05/2013)     Current Outpatient Medications:  .  albuterol (PROVENTIL) (2.5 MG/3ML) 0.083% nebulizer solution, TAKE 3ML(2.5MG ) BY NEBULIZATION EVERY 6 HOURS AS NEEDED FOR SHORTNESS OF BREATH, Disp: 120 mL, Rfl: 11 .  albuterol (VENTOLIN HFA) 108 (90 Base) MCG/ACT inhaler, INHALE 2 PUFF  BY MOUTH  EVERY 6  HOUR as NEEDED FOR WHEEZING ,SHORTNESS OF BREATH, Disp: 54 g, Rfl: 3 .  ALPRAZolam (XANAX) 0.5 MG tablet, TAKE 1 TABLET BY MOUTH EVERY DAY AS NEEDED FOR ANXIETY, Disp: 90 tablet, Rfl: 0 .  amoxicillin-clavulanate (AUGMENTIN) 875-125 MG tablet, Take 1 tablet by mouth 2 (two) times daily., Disp: 20 tablet, Rfl: 0 .  azelastine (ASTELIN) 0.1 % nasal spray, Place 1 spray into both nostrils 2 (two) times daily. Use in each nostril as directed, Disp: 30 mL, Rfl: 12 .  BD PEN NEEDLE NANO U/F 32G X 4 MM MISC, USE 3 TIMES DAILY AS DIRECTED, Disp: 100 each, Rfl: 5 .  BYSTOLIC 5 MG tablet, TAKE 1 TABLET DAILY, Disp: 90 tablet, Rfl: 1 .  chlorpheniramine-HYDROcodone (TUSSIONEX PENNKINETIC ER) 10-8 MG/5ML SUER, Take 5 mLs by mouth every 12 (twelve) hours as needed for cough., Disp: 100 mL, Rfl: 0 .  cyclobenzaprine (FLEXERIL) 10 MG tablet, TAKE 1/2 TO 1 TABLET 3 TIMES DAILY AS NEEDED FOR MUSCLE SPASMS., Disp: 90 tablet, Rfl: 0 .  dexlansoprazole (DEXILANT) 60 MG capsule, Take 1 capsule (60 mg total) by mouth daily before breakfast., Disp: 30 capsule, Rfl: 0 .  estradiol (ESTRACE VAGINAL) 0.1 MG/GM vaginal cream, Place 2 g vaginally daily., Disp: 42.5 g, Rfl: 2 .  famotidine (PEPCID) 40 MG tablet, Take 1 tablet (40 mg total) by mouth 2 (two) times daily., Disp: 60 tablet, Rfl: 0 .  fluticasone (FLOVENT HFA) 44 MCG/ACT inhaler, Inhale 2 puffs into the lungs 2 (two) times daily., Disp: 3 Inhaler, Rfl: 3 .  gabapentin (NEURONTIN) 100 MG capsule, TAKE 1 CAPSULE AT BEDTIME, Disp: 90 capsule, Rfl: 7 .  hydroquinone 4 % cream, Apply 1 application topically 2 (two) times daily., Disp: 28.35 g, Rfl: 0 .  Ibuprofen-Famotidine (DUEXIS) 800-26.6 MG TABS, Duexis 800 mg-26.6 mg tablet, Disp: , Rfl:  .  ipratropium (ATROVENT) 0.06 % nasal spray, Place 2 sprays into both nostrils 2 (two) times daily., Disp: 15 mL, Rfl: 2 .  METRONIDAZOLE, TOPICAL, 0.75 % LOTN, Apply 1 application topically daily., Disp: 59 mL, Rfl: 5 .   montelukast (SINGULAIR) 10 MG tablet, TAKE 1 TABLET BY MOUTH EVERYDAY AT BEDTIME, Disp: 90 tablet, Rfl: 1 .  olmesartan-hydrochlorothiazide ([name] HCT) 40-25 MG tablet, Take 1 tablet by mouth daily., Disp: 90 tablet, Rfl: 1 .  triamcinolone ointment (KENALOG) 0.5 %, APPLY TO AFFECTED AREA TWICE A DAY, Disp: 30 g, Rfl: 0  EXAM:  VITALS per patient if applicable:  GENERAL: alert, oriented, appears well and in no acute distress  HEENT: atraumatic, conjunttiva clear, no obvious abnormalities on inspection of external nose and ears  NECK: normal movements of the head and neck  LUNGS: on inspection no signs of respiratory distress, breathing rate appears normal, no obvious gross SOB, gasping or wheezing  CV: no obvious cyanosis  MS: moves all visible extremities without noticeable abnormality  PSYCH/NEURO: pleasant and cooperative, no obvious depression or anxiety, speech and thought processing grossly intact  ASSESSMENT AND PLAN:  Discussed the following assessment and plan:  Flank pain  Fever, unspecified fever cause  Malaise  Flu-like symptoms  -we discussed possible serious and likely etiologies, options for evaluation and workup, limitations of telemedicine visit vs in person visit, treatment, treatment risks and precautions. Pt prefers to treat via telemedicine empirically rather than in person at this moment.  Given the degree of her reported symptoms, the her underlying medical conditions and her report of abnormal liver and kidney functions at a hormone clinic recently, advise she will need a prompt in person evaluation at a higher level of care.  Discussed options for in person care and she has decided to go to an urgent care near her this evening.  She reports her husband can take her and does not feel that she needs transport at this time. Also advised that once the acute situation has been evaluated and treated, she will need a follow-up visit with her primary care doctor  to review the reported abnormal labs that she had at an outside facility. Did let this patient know that I only do telemedicine on Tuesdays and Thursdays for [place]. Advised to schedule follow up visit with PCP office or [facility] if any further questions or concerns in the future to avoid delays in care.   I discussed the assessment and treatment plan with the patient.  Over 20 minutes spent on this visit with review of history, evaluation and counseling.  The patient was provided an opportunity to ask questions and all were answered. The patient agreed with the plan and demonstrated an understanding of the instructions.     Lucretia Kern, DO

## 2021-01-22 NOTE — Chronic Care Management (AMB) (Signed)
Chronic Care Management Pharmacy Assistant   Name: Cynthia Kirk  MRN: [id number] DOB: [birth year]  Reason for Encounter: Disease State/ Hypertension Adherence Call  PCP : [name], MD  Allergies:   Allergies  Allergen Reactions   Aspirin Hives, Shortness Of Breath and Other (See Comments)    Passed out   Ibuprofen Nausea And Vomiting and Other (See Comments)    Upsets stomach   Latex Rash    Medications: Outpatient Encounter Medications as of 01/22/2021  Medication Sig   albuterol (PROVENTIL) (2.5 MG/3ML) 0.083% nebulizer solution TAKE 3ML(2.5MG ) BY NEBULIZATION EVERY 6 HOURS AS NEEDED FOR SHORTNESS OF BREATH   albuterol (VENTOLIN HFA) 108 (90 Base) MCG/ACT inhaler INHALE 2 PUFF&nbsp;&nbsp;BY MOUTH&nbsp;&nbsp;EVERY 6 HOUR as NEEDED FOR WHEEZING ,SHORTNESS OF BREATH   ALPRAZolam (XANAX) 0.5 MG tablet TAKE 1 TABLET BY MOUTH EVERY DAY AS NEEDED FOR ANXIETY   amoxicillin-clavulanate (AUGMENTIN) 875-125 MG tablet Take 1 tablet by mouth 2 (two) times daily.   azelastine (ASTELIN) 0.1 % nasal spray Place 1 spray into both nostrils 2 (two) times daily. Use in each nostril as directed   BD PEN NEEDLE NANO U/F 32G X 4 MM MISC USE 3 TIMES DAILY AS DIRECTED   BYSTOLIC 5 MG tablet TAKE 1 TABLET DAILY   chlorpheniramine-HYDROcodone (TUSSIONEX PENNKINETIC ER) 10-8 MG/5ML SUER Take 5 mLs by mouth every 12 (twelve) hours as needed for cough.   cyclobenzaprine (FLEXERIL) 10 MG tablet TAKE 1/2 TO 1 TABLET 3 TIMES DAILY AS NEEDED FOR MUSCLE SPASMS.   dexlansoprazole (DEXILANT) 60 MG capsule Take 1 capsule (60 mg total) by mouth daily before breakfast.   estradiol (ESTRACE VAGINAL) 0.1 MG/GM vaginal cream Place 2 g vaginally daily.   famotidine (PEPCID) 40 MG tablet Take 1 tablet (40 mg total) by mouth 2 (two) times daily.   fluticasone (FLOVENT HFA) 44 MCG/ACT inhaler Inhale 2 puffs into the lungs 2 (two) times daily.   gabapentin (NEURONTIN) 100 MG capsule TAKE 1 CAPSULE AT  BEDTIME   hydroquinone 4 % cream Apply 1 application topically 2 (two) times daily.   Ibuprofen-Famotidine (DUEXIS) 800-26.6 MG TABS Duexis 800 mg-26.6 mg tablet   ipratropium (ATROVENT) 0.06 % nasal spray Place 2 sprays into both nostrils 2 (two) times daily.   METRONIDAZOLE, TOPICAL, 0.75 % LOTN Apply 1 application topically daily.   montelukast (SINGULAIR) 10 MG tablet TAKE 1 TABLET BY MOUTH EVERYDAY AT BEDTIME   olmesartan-hydrochlorothiazide ([name] HCT) 40-25 MG tablet Take 1 tablet by mouth daily.   triamcinolone ointment (KENALOG) 0.5 % APPLY TO AFFECTED AREA TWICE A DAY   No facility-administered encounter medications on file as of 01/22/2021.    Current Diagnosis: Patient Active Problem List   Diagnosis Date Noted   Spasm of muscle of lower back 10/14/2020   Rosacea 04/30/2020   Bilateral primary osteoarthritis of knee 04/30/2020   Neck pain on left side 08/03/2018   Atrophic vaginitis 02/23/2018   Osteopenia 02/23/2018   Urinary incontinence 02/23/2018   Vasomotor rhinitis 07/26/2017   Anxiety 02/01/2017   URI (upper respiratory infection) 02/01/2017   Renal cyst 07/23/2016   Cervical disc disorder with radiculopathy of cervical region 03/13/2016   B12 deficiency    Cataract 02/25/2014   Glaucoma suspect 02/25/2014   Lumbar spinal stenosis 03/07/2013   Ejection fraction    Syncope    Severe obesity (BMI >= 40) ([place]) 09/14/2010   Essential hypertension 09/14/2010   GERD 08/22/2009   Pulmonary sarcoidosis ([place]) 06/30/2009   Diabetes mellitus (  [place]) 06/30/2009   OSA on CPAP 06/30/2009   Upper airway cough syndrome 06/30/2009   Mild intermittent asthma 06/30/2009    Reviewed chart prior to disease state call. Spoke with patient regarding BP  Recent Office Vitals: BP Readings from Last 3 Encounters:  10/07/20 110/64  08/14/20 118/77  04/30/20 126/68   Pulse Readings from Last 3 Encounters:  10/07/20 80  08/14/20 92  04/30/20  (!) 56    Wt Readings from Last 3 Encounters:  10/07/20 253 lb 9.6 oz (115 kg)  08/14/20 249 lb 11.2 oz (113.3 kg)  04/30/20 247 lb 12.8 oz (112.4 kg)     Kidney Function Lab Results  Component Value Date/Time   CREATININE 0.89 04/30/2020 02:59 PM   CREATININE 0.89 09/20/2019 10:30 AM   GFR 62.85 04/30/2020 02:59 PM   GFRNONAA >60 12/27/2017 02:19 PM   GFRAA >60 12/27/2017 02:19 PM    BMP Latest Ref Rng & Units 04/30/2020 09/20/2019 09/19/2018  Glucose 70 - 99 mg/dL 82 86 88  BUN 6 - 23 mg/dL 21 22 23   Creatinine 0.40 - 1.20 mg/dL 0.89 0.89 1.02  Sodium 135 - 145 mEq/L 140 137 138  Potassium 3.5 - 5.1 mEq/L 3.9 3.5 3.9  Chloride 96 - 112 mEq/L 101 100 100  CO2 19 - 32 mEq/L 31 28 31   Calcium 8.4 - 10.5 mg/dL 9.1 9.5 9.5     Current antihypertensive regimen:  o Bystolic 5 mg tablet daily o [name] 40-25 tablet daily   How often are you checking your Blood Pressure? infrequently    Current home BP readings: n/a   What recent interventions/DTPs have been made by any provider to improve Blood Pressure control since last CPP Visit: Patient states she is currently taking her medications as instructed.   Any recent hospitalizations or ED visits since last visit with CPP? No , patient has not had any recent hospitalizations or ED visits since her last visit with Charlene Brooke, CPP.   What diet changes have been made to improve Blood Pressure Control?  o Patient states she does not use salt and she uses minimal sugar.   What exercise is being done to improve your Blood Pressure Control?  o Patient states she walks daily for about 15-20 minutes.  Adherence Review: Is the patient currently on ACE/ARB medication? Yes Does the patient have >5 day gap between last estimated fill dates? No  Patient states she is currently in the process of moving to [place]. Patient states she plans to keep her Dr. Quay Burow as her PCP.  April D Calhoun, [place] Pharmacist  Assistant [phone]   Follow-Up:  Pharmacist Review

## 2021-01-23 DIAGNOSIS — R7989 Other specified abnormal findings of blood chemistry: Secondary | ICD-10-CM | POA: Diagnosis not present

## 2021-01-23 DIAGNOSIS — N3090 Cystitis, unspecified without hematuria: Secondary | ICD-10-CM | POA: Diagnosis not present

## 2021-01-23 DIAGNOSIS — Z79899 Other long term (current) drug therapy: Secondary | ICD-10-CM | POA: Diagnosis not present

## 2021-01-23 DIAGNOSIS — R1031 Right lower quadrant pain: Secondary | ICD-10-CM | POA: Diagnosis not present

## 2021-01-23 DIAGNOSIS — R109 Unspecified abdominal pain: Secondary | ICD-10-CM | POA: Diagnosis not present

## 2021-01-23 DIAGNOSIS — Z91040 Latex allergy status: Secondary | ICD-10-CM | POA: Diagnosis not present

## 2021-01-23 DIAGNOSIS — N281 Cyst of kidney, acquired: Secondary | ICD-10-CM | POA: Diagnosis not present

## 2021-01-23 DIAGNOSIS — Z20822 Contact with and (suspected) exposure to covid-19: Secondary | ICD-10-CM | POA: Diagnosis not present

## 2021-01-23 DIAGNOSIS — G8911 Acute pain due to trauma: Secondary | ICD-10-CM | POA: Diagnosis not present

## 2021-01-23 DIAGNOSIS — R945 Abnormal results of liver function studies: Secondary | ICD-10-CM | POA: Diagnosis not present

## 2021-01-23 DIAGNOSIS — N3000 Acute cystitis without hematuria: Secondary | ICD-10-CM | POA: Diagnosis not present

## 2021-01-23 DIAGNOSIS — I878 Other specified disorders of veins: Secondary | ICD-10-CM | POA: Diagnosis not present

## 2021-01-23 DIAGNOSIS — R112 Nausea with vomiting, unspecified: Secondary | ICD-10-CM | POA: Diagnosis not present

## 2021-01-23 DIAGNOSIS — I10 Essential (primary) hypertension: Secondary | ICD-10-CM | POA: Diagnosis not present

## 2021-01-23 DIAGNOSIS — Z888 Allergy status to other drugs, medicaments and biological substances status: Secondary | ICD-10-CM | POA: Diagnosis not present

## 2021-01-23 DIAGNOSIS — K5730 Diverticulosis of large intestine without perforation or abscess without bleeding: Secondary | ICD-10-CM | POA: Diagnosis not present

## 2021-01-26 ENCOUNTER — Telehealth: Payer: Self-pay | Admitting: Gastroenterology

## 2021-01-26 ENCOUNTER — Telehealth: Payer: Self-pay | Admitting: Internal Medicine

## 2021-01-26 NOTE — Telephone Encounter (Signed)
Patient wanted Dr. Stark to be aware that she was in the ED on Friday and had an elevated Alk Phos.  Patient has follow up with Dr. Stark on 2/24 already scheduled, but she wanted Dr. Stark to review prior to the appointment to see if she needed to be seen earlier.  I reassured her that her labs have been slightly elevated for 2 years and that it is not an emergency.  She thanked me for the call, but wanted to make sure I let Dr. Stark know before her appointment on Thurs. Labs are in her chart  

## 2021-01-26 NOTE — Telephone Encounter (Signed)
Pt is requesting a call back from a nurse to discuss her elevated liver enzymes, pt was seen at the ED on Friday and was told to follow up with her GI.

## 2021-01-26 NOTE — Telephone Encounter (Signed)
She should come in for follow-up and repeat blood work-next week is okay.

## 2021-01-26 NOTE — Telephone Encounter (Signed)
Reviewed ED visit in Care Everywhere and reviewed her LFTs with mild alk phos elevation. No GI abnormalities on 2/18 CT AP w/o contrast.

## 2021-01-26 NOTE — Telephone Encounter (Signed)
   Patient calling to report she went to ED over the weekend. She would like Dr Quay Burow to be made aware that she has elevated liver enzymes.   Seeking advice

## 2021-01-27 NOTE — Telephone Encounter (Signed)
Appointment made

## 2021-01-29 ENCOUNTER — Ambulatory Visit: Payer: Medicare Other | Admitting: Gastroenterology

## 2021-01-29 ENCOUNTER — Telehealth: Payer: Self-pay | Admitting: Gastroenterology

## 2021-01-29 NOTE — Telephone Encounter (Signed)
Pt is requesting a call back from a nurse to discuss the appt she was not able to make today.

## 2021-01-29 NOTE — Telephone Encounter (Signed)
Left message on machine to call back  

## 2021-01-30 ENCOUNTER — Telehealth: Payer: Self-pay | Admitting: Pharmacist

## 2021-01-30 NOTE — Progress Notes (Signed)
Chronic Care Management Pharmacy Assistant   Name: Cynthia Kirk  MRN: [id number] DOB: January 21, 1951  Reason for Encounter: Chart Review   PCP : [name], MD  Allergies:   Allergies  Allergen Reactions  . Aspirin Hives, Shortness Of Breath and Other (See Comments)    Passed out  . Ibuprofen Nausea And Vomiting and Other (See Comments)    Upsets stomach  . Latex Rash    Medications: Outpatient Encounter Medications as of 01/30/2021  Medication Sig  . albuterol (PROVENTIL) (2.5 MG/3ML) 0.083% nebulizer solution TAKE 3ML(2.5MG ) BY NEBULIZATION EVERY 6 HOURS AS NEEDED FOR SHORTNESS OF BREATH  . albuterol (VENTOLIN HFA) 108 (90 Base) MCG/ACT inhaler INHALE 2 PUFF&nbsp;&nbsp;BY MOUTH&nbsp;&nbsp;EVERY 6 HOUR as NEEDED FOR WHEEZING ,SHORTNESS OF BREATH  . ALPRAZolam (XANAX) 0.5 MG tablet TAKE 1 TABLET BY MOUTH EVERY DAY AS NEEDED FOR ANXIETY  . amoxicillin-clavulanate (AUGMENTIN) 875-125 MG tablet Take 1 tablet by mouth 2 (two) times daily.  [name] azelastine (ASTELIN) 0.1 % nasal spray Place 1 spray into both nostrils 2 (two) times daily. Use in each nostril as directed  . BD PEN NEEDLE NANO U/F 32G X 4 MM MISC USE 3 TIMES DAILY AS DIRECTED  . BYSTOLIC 5 MG tablet TAKE 1 TABLET DAILY  . chlorpheniramine-HYDROcodone (TUSSIONEX PENNKINETIC ER) 10-8 MG/5ML SUER Take 5 mLs by mouth every 12 (twelve) hours as needed for cough.  . cyclobenzaprine (FLEXERIL) 10 MG tablet TAKE 1/2 TO 1 TABLET 3 TIMES DAILY AS NEEDED FOR MUSCLE SPASMS.  [name] dexlansoprazole (DEXILANT) 60 MG capsule Take 1 capsule (60 mg total) by mouth daily before breakfast.  . estradiol (ESTRACE VAGINAL) 0.1 MG/GM vaginal cream Place 2 g vaginally daily.  . famotidine (PEPCID) 40 MG tablet Take 1 tablet (40 mg total) by mouth 2 (two) times daily.  . fluticasone (FLOVENT HFA) 44 MCG/ACT inhaler Inhale 2 puffs into the lungs 2 (two) times daily.  [name] gabapentin (NEURONTIN) 100 MG capsule TAKE 1 CAPSULE AT BEDTIME  . hydroquinone 4 %  cream Apply 1 application topically 2 (two) times daily.  . Ibuprofen-Famotidine (DUEXIS) 800-26.6 MG TABS Duexis 800 mg-26.6 mg tablet  . ipratropium (ATROVENT) 0.06 % nasal spray Place 2 sprays into both nostrils 2 (two) times daily.  [name] METRONIDAZOLE, TOPICAL, 0.75 % LOTN Apply 1 application topically daily.  . montelukast (SINGULAIR) 10 MG tablet TAKE 1 TABLET BY MOUTH EVERYDAY AT BEDTIME  . olmesartan-hydrochlorothiazide ([name] HCT) 40-25 MG tablet Take 1 tablet by mouth daily.  [name] triamcinolone ointment (KENALOG) 0.5 % APPLY TO AFFECTED AREA TWICE A DAY   No facility-administered encounter medications on file as of 01/30/2021.    Current Diagnosis: Patient Active Problem List   Diagnosis Date Noted  . Spasm of muscle of lower back 10/14/2020  . Rosacea 04/30/2020  . Bilateral primary osteoarthritis of knee 04/30/2020  . Neck pain on left side 08/03/2018  . Atrophic vaginitis 02/23/2018  . Osteopenia 02/23/2018  . Urinary incontinence 02/23/2018  . Vasomotor rhinitis 07/26/2017  . Anxiety 02/01/2017  . URI (upper respiratory infection) 02/01/2017  . Renal cyst 07/23/2016  . Cervical disc disorder with radiculopathy of cervical region 03/13/2016  . B12 deficiency   . Cataract 02/25/2014  . Glaucoma suspect 02/25/2014  . Lumbar spinal stenosis 03/07/2013  . Ejection fraction   . Syncope   . Severe obesity (BMI >= 40) ([place]) 09/14/2010  . Essential hypertension 09/14/2010  . GERD 08/22/2009  . Pulmonary sarcoidosis ([place]) 06/30/2009  . Diabetes mellitus ([place]) 06/30/2009  .  OSA on CPAP 06/30/2009  . Upper airway cough syndrome 06/30/2009  . Mild intermittent asthma 06/30/2009    Goals Addressed   None     Follow-Up:  Pharmacist Review   Reviewed chart for medication changes and adherence.  Patient was seen in the ED on 01/23/21, no other office visits or consults since last care coordination call with clinical pharmacy No medication changes were made  No gaps in  adherence identified. Patient has follow up scheduled with pharmacy team. No further action required.   Wendy Poet, [place] [phone]

## 2021-01-30 NOTE — Telephone Encounter (Signed)
Patient wanted to come in late due to the rain.  She is advised that she will need to reschedule. She has been rescheduled for 03/03/21

## 2021-02-03 ENCOUNTER — Ambulatory Visit: Payer: Medicare Other | Admitting: Internal Medicine

## 2021-02-05 ENCOUNTER — Other Ambulatory Visit: Payer: Self-pay | Admitting: Internal Medicine

## 2021-02-05 NOTE — Telephone Encounter (Signed)
Patient calling, she is completely out

## 2021-02-06 ENCOUNTER — Other Ambulatory Visit: Payer: Self-pay | Admitting: Internal Medicine

## 2021-02-10 NOTE — Progress Notes (Unsigned)
Subjective:    Patient ID: Cynthia Kirk, female    DOB: 1951/05/07, 70 y.o.   MRN: [id number]  HPI The patient is here for follow up from the ED.   2/18 [facility] ED  - dx cystitis, flank pain, elevated lfts  She had right flank pain, dark urine, fever x 5 days.  She had a recurrent headache, syncopal episode that day hitting her right ribs and right side of face.   urine culture - > 100000 Enterococccus faecalis. LFTs elevated.  ALT  112, AST 59.  Alk phos 306. UA positive for infection.   CT Abdomen/pelvis:   No acute process. Diverticulosis.    rx'd - keflex 500 mg TID x 5 days.  Diflucan 150 mg x 1, robaxin 500 mg BID x 10 days     Medications and allergies reviewed with patient and updated if appropriate.  Patient Active Problem List   Diagnosis Date Noted  . Spasm of muscle of lower back 10/14/2020  . Rosacea 04/30/2020  . Bilateral primary osteoarthritis of knee 04/30/2020  . Neck pain on left side 08/03/2018  . Atrophic vaginitis 02/23/2018  . Osteopenia 02/23/2018  . Urinary incontinence 02/23/2018  . Vasomotor rhinitis 07/26/2017  . Anxiety 02/01/2017  . URI (upper respiratory infection) 02/01/2017  . Renal cyst 07/23/2016  . Cervical disc disorder with radiculopathy of cervical region 03/13/2016  . B12 deficiency   . Cataract 02/25/2014  . Glaucoma suspect 02/25/2014  . Lumbar spinal stenosis 03/07/2013  . Ejection fraction   . Syncope   . Severe obesity (BMI >= 40) ([place]) 09/14/2010  . Essential hypertension 09/14/2010  . GERD 08/22/2009  . Pulmonary sarcoidosis ([place]) 06/30/2009  . Diabetes mellitus ([place]) 06/30/2009  . OSA on CPAP 06/30/2009  . Upper airway cough syndrome 06/30/2009  . Mild intermittent asthma 06/30/2009    Current Outpatient Medications on File Prior to Visit  Medication Sig Dispense Refill  . albuterol (PROVENTIL) (2.5 MG/3ML) 0.083% nebulizer solution TAKE 3ML(2.5MG) BY NEBULIZATION EVERY 6 HOURS AS NEEDED FOR SHORTNESS  OF BREATH 120 mL 11  . albuterol (VENTOLIN HFA) 108 (90 Base) MCG/ACT inhaler INHALE 2 PUFF&nbsp;&nbsp;BY MOUTH&nbsp;&nbsp;EVERY 6 HOUR as NEEDED FOR WHEEZING ,SHORTNESS OF BREATH 54 g 3  . ALPRAZolam (XANAX) 0.5 MG tablet TAKE 1 TABLET BY MOUTH EVERY DAY AS NEEDED FOR ANXIETY 90 tablet 0  . amoxicillin-clavulanate (AUGMENTIN) 875-125 MG tablet Take 1 tablet by mouth 2 (two) times daily. 20 tablet 0  . azelastine (ASTELIN) 0.1 % nasal spray Place 1 spray into both nostrils 2 (two) times daily. Use in each nostril as directed 30 mL 12  . BD PEN NEEDLE NANO 2ND GEN 32G X 4 MM MISC USE AS DIRECTED 100 each 2  . BYSTOLIC 5 MG tablet TAKE 1 TABLET DAILY 90 tablet 1  . chlorpheniramine-HYDROcodone (TUSSIONEX PENNKINETIC ER) 10-8 MG/5ML SUER Take 5 mLs by mouth every 12 (twelve) hours as needed for cough. 100 mL 0  . cyclobenzaprine (FLEXERIL) 10 MG tablet TAKE 1/2 TO 1 TABLET 3 TIMES DAILY AS NEEDED FOR MUSCLE SPASMS. 90 tablet 0  . dexlansoprazole (DEXILANT) 60 MG capsule Take 1 capsule (60 mg total) by mouth daily before breakfast. 30 capsule 0  . estradiol (ESTRACE VAGINAL) 0.1 MG/GM vaginal cream Place 2 g vaginally daily. 42.5 g 2  . famotidine (PEPCID) 40 MG tablet Take 1 tablet (40 mg total) by mouth 2 (two) times daily. 60 tablet 0  . fluticasone (FLOVENT HFA) 44 MCG/ACT inhaler  Inhale 2 puffs into the lungs 2 (two) times daily. 3 Inhaler 3  . gabapentin (NEURONTIN) 100 MG capsule TAKE 1 CAPSULE AT BEDTIME 90 capsule 7  . hydroquinone 4 % cream Apply 1 application topically 2 (two) times daily. 28.35 g 0  . Ibuprofen-Famotidine (DUEXIS) 800-26.6 MG TABS Duexis 800 mg-26.6 mg tablet    . ipratropium (ATROVENT) 0.06 % nasal spray Place 2 sprays into both nostrils 2 (two) times daily. 15 mL 2  . METRONIDAZOLE, TOPICAL, 0.75 % LOTN Apply 1 application topically daily. 59 mL 5  . montelukast (SINGULAIR) 10 MG tablet TAKE 1 TABLET BY MOUTH EVERYDAY AT BEDTIME 90 tablet 1  .  olmesartan-hydrochlorothiazide ([name] HCT) 40-25 MG tablet Take 1 tablet by mouth daily. 90 tablet 1  . triamcinolone ointment (KENALOG) 0.5 % APPLY TO AFFECTED AREA TWICE A DAY 30 g 0   No current facility-administered medications on file prior to visit.    Past Medical History:  Diagnosis Date  . Abnormal Pap smear 2008  . Adenomatous colon polyp   . Allergic rhinitis, cause unspecified   . Anxiety   . ASCUS with positive high risk HPV   . Blood transfusion 1976   Due to ectopic pregnancy  . Chronic back pain   . Ejection fraction    EF 45-50%, echo, October 27, 2011  . Endometrial mass 2011  . Esophageal reflux   . Fibroid   . H/O Clostridium difficile infection 03/2010  . H/O menorrhagia 2011  . Herpes simplex type 2 infection complicating pregnancy   . Hiatal hernia   . Iron deficiency anemia   . Morbid obesity ([place]) 2010  . Obstructive sleep apnea (adult) (pediatric)    cpap  . Panic attacks   . Plantar fasciitis   . Pneumonia 2005; 2007; 2009; 2011  . Sarcoidosis 1996   @ [employer]  . Simple endometrial hyperplasia 2011  . Spinal stenosis, lumbar    s/p decompression laminectomy 09/2013  . Syncope    ?? Syncope ??  . Type 2 diabetes, diet controlled ([place])   . Unspecified asthma(493.90)   . Urinary incontinence, mixed 2011  . VIN II (vulvar intraepithelial neoplasia II)     Past Surgical History:  Procedure Laterality Date  . COLPOSCOPY VULVA W/ BIOPSY     "I've had maybe 3" (09/05/2013)  . DILATION AND CURETTAGE OF UTERUS  2007  . [place]; ~ 1978  . HYSTEROSCOPY DIAGNOSTIC  2011  . LUMBAR LAMINECTOMY/DECOMPRESSION MICRODISCECTOMY N/A 09/05/2013   Procedure: Thoracic eleven-twelve Thoracic Laminectomy;  Surgeon: Eustace Moore, MD;  Location: [place] [facility];  Service: Neurosurgery;  Laterality: N/A;  Thoracic eleven-twelve Thoracic Laminectomy  . MAXIMUM ACCESS (MAS)POSTERIOR LUMBAR INTERBODY FUSION (PLIF) 1 LEVEL N/A 09/05/2013   Procedure:  Lumbar four-five Maximum Access Surgery  Posterior lumbar interbody fusion;  Surgeon: Eustace Moore, MD;  Location: [place] [facility];  Service: Neurosurgery;  Laterality: N/A;  Lumbar four-five Maximum Access Surgery  Posterior lumbar interbody fusion  . POSTERIOR LAMINECTOMY / DECOMPRESSION LUMBAR SPINE  09/05/2013  . POSTERIOR LUMBAR FUSION  09/05/2013  . THORACIC LAMINECTOMY  09/05/2013  . URETHRAL SLING  2011  . VULVECTOMY  ~ 2007   "for cancerous cells" (09/05/2013)    Social History   Socioeconomic History  . Marital status: Married    Spouse name: Not on file  . Number of children: 2  . Years of education: PHD  . Highest education level: Not on file  Occupational  History  . Occupation: retired    Fish farm manager: UNEMPLOYED  Tobacco Use  . Smoking status: Never Smoker  . Smokeless tobacco: Never Used  Vaping Use  . Vaping Use: Never used  Substance and Sexual Activity  . Alcohol use: Not Currently    Comment: 2018 - very occasionally  . Drug use: No  . Sexual activity: Yes    Birth control/protection: Post-menopausal  Other Topics Concern  . Not on file  Social History Narrative   Pt lives at home with her spouse.   She does not use caffeine.   Social Determinants of Health   Financial Resource Strain: Low Risk   . Difficulty of Paying Living Expenses: Not hard at all  Food Insecurity: Not on file  Transportation Needs: Not on file  Physical Activity: Not on file  Stress: Not on file  Social Connections: Not on file    Family History  Problem Relation Age of Onset  . Heart attack Father   . Hypertension Father   . Diabetes Father   . Seizures Father   . Migraines Father   . Kidney disease Sister   . Osteoporosis Sister   . Heart attack Sister   . Cancer Paternal Uncle   . Asthma Daughter   . Asthma Son   . Osteoporosis Sister   . Lung cancer Maternal Uncle   . Kidney disease Other   . Diabetes Other   . High Cholesterol Brother        x2    Review of  Systems     Objective:  There were no vitals filed for this visit. BP Readings from Last 3 Encounters:  10/07/20 110/64  08/14/20 118/77  04/30/20 126/68   Wt Readings from Last 3 Encounters:  10/07/20 253 lb 9.6 oz (115 kg)  08/14/20 249 lb 11.2 oz (113.3 kg)  04/30/20 247 lb 12.8 oz (112.4 kg)   There is no height or weight on file to calculate BMI.   Physical Exam    Constitutional: Appears well-developed and well-nourished. No distress.  HENT:  Head: Normocephalic and atraumatic.  Neck: Neck supple. No tracheal deviation present. No thyromegaly present.  No cervical lymphadenopathy Cardiovascular: Normal rate, regular rhythm and normal heart sounds.   No murmur heard. No carotid bruit .  No edema Pulmonary/Chest: Effort normal and breath sounds normal. No respiratory distress. No has no wheezes. No rales.  Skin: Skin is warm and dry. Not diaphoretic.  Psychiatric: Normal mood and affect. Behavior is normal.      Assessment & Plan:    See Problem List for Assessment and Plan of chronic medical problems.    This visit occurred during the SARS-CoV-2 public health emergency.  Safety protocols were in place, including screening questions prior to the visit, additional usage of staff PPE, and extensive cleaning of exam room while observing appropriate contact time as indicated for disinfecting solutions.

## 2021-02-11 ENCOUNTER — Other Ambulatory Visit: Payer: Self-pay | Admitting: Gastroenterology

## 2021-02-11 ENCOUNTER — Telehealth (INDEPENDENT_AMBULATORY_CARE_PROVIDER_SITE_OTHER): Payer: Medicare Other | Admitting: Internal Medicine

## 2021-02-11 ENCOUNTER — Encounter: Payer: Self-pay | Admitting: Internal Medicine

## 2021-02-11 DIAGNOSIS — R7989 Other specified abnormal findings of blood chemistry: Secondary | ICD-10-CM | POA: Insufficient documentation

## 2021-02-11 DIAGNOSIS — R109 Unspecified abdominal pain: Secondary | ICD-10-CM | POA: Insufficient documentation

## 2021-02-11 NOTE — Progress Notes (Signed)
Virtual Visit via Video Note  I connected with Cynthia Kirk on 02/11/21 at 10:30 AM EST by a video enabled telemedicine application and verified that I am speaking with the correct person using two identifiers.   I discussed the limitations of evaluation and management by telemedicine and the availability of in person appointments. The patient expressed understanding and agreed to proceed.  Present for the visit:  Myself, Dr Billey Gosling, [name].  The patient is currently at home and I am in the office.    No referring provider.    History of Present Illness: This is an acute visit for nausea, stomach pain  She thinks it may be the duexis.  She was sick a couple of weeks ago and ended up in the ED.     2/18 [facility] ED  - dx cystitis, flank pain, elevated lfts  She had right flank pain, dark urine, fever x 5 days.  She had a recurrent headache, syncopal episode that day hitting her right ribs and right side of face.   urine culture - > 100000 Enterococccus faecalis. LFTs elevated.  ALT  112, AST 59.  Alk phos 306. UA positive for infection.   CT Abdomen/pelvis:   No acute process. Diverticulosis.    rx'd - keflex 500 mg TID x 5 days.  Diflucan 150 mg x 1, robaxin 500 mg BID x 10 days   She felt better after the antibiotics.  Her back has been bothering her.  She plans on seeing her neurosurgeon.   The pain in her back comes and goes.  She stopped taking duexis after she was told her kidney and liver levels were high.  She started this for her back and knees.  After she stopped it She initially did ok, but her back pain has increased.    Over the weekend she took a duexis and last night she was sick.  She is having gas attacks.  Her right lower abdomen and side and back hurt and she has heaviness in her chest.  She did eat heavy food over the weekend and that may have also caused it.     Review of Systems  Constitutional: Positive for chills and fever (subjective, last  night).  Gastrointestinal: Positive for abdominal pain (right lower abd going to back). Negative for nausea.  Musculoskeletal: Positive for back pain.  Neurological: Positive for headaches.      Social History   Socioeconomic History  . Marital status: Married    Spouse name: Not on file  . Number of children: 2  . Years of education: PHD  . Highest education level: Not on file  Occupational History  . Occupation: retired    Fish farm manager: UNEMPLOYED  Tobacco Use  . Smoking status: Never Smoker  . Smokeless tobacco: Never Used  Vaping Use  . Vaping Use: Never used  Substance and Sexual Activity  . Alcohol use: Not Currently    Comment: 2018 - very occasionally  . Drug use: No  . Sexual activity: Yes    Birth control/protection: Post-menopausal  Other Topics Concern  . Not on file  Social History Narrative   Pt lives at home with her spouse.   She does not use caffeine.   Social Determinants of Health   Financial Resource Strain: Low Risk   . Difficulty of Paying Living Expenses: Not hard at all  Food Insecurity: Not on file  Transportation Needs: Not on file  Physical Activity: Not on file  Stress: Not on file  Social Connections: Not on file     Observations/Objective: Appears well in NAD Breathing normally  Assessment and Plan:  See Problem List for Assessment and Plan of chronic medical problems.   Follow Up Instructions:    I discussed the assessment and treatment plan with the patient. The patient was provided an opportunity to ask questions and all were answered. The patient agreed with the plan and demonstrated an understanding of the instructions.   The patient was advised to call back or seek an in-person evaluation if the symptoms worsen or if the condition fails to improve as anticipated.    [name], MD

## 2021-02-11 NOTE — Assessment & Plan Note (Signed)
Acute Unfortunately this is a virtual visit and I am not able to examine her She states pain is in the right lower abdomen and radiates to the side and back ?  UTI, gallbladder issue given elevated LFTs Recent CT scan did not reveal any kidney stones or other abnormalities CBC, CMP, right upper quadrant ultrasound If pain worsens or does not improve will need in person evaluation here or urgent care

## 2021-02-11 NOTE — Assessment & Plan Note (Signed)
Acute Elevated liver tests seen last month when she went to urgent care She has not had any repeat blood work ?  Related to Duexis, gallbladder issue, versus other CT of the abdomen and pelvis did not reveal any concerning findings-gallbladder appeared normal, liver appeared normal Stop Duexis-that could potentially be the cause of the elevated LFTs Bland diet in case this is a gallbladder issue CBC, CMP Ultrasound of right upper quadrant

## 2021-02-20 ENCOUNTER — Other Ambulatory Visit (INDEPENDENT_AMBULATORY_CARE_PROVIDER_SITE_OTHER): Payer: Medicare Other

## 2021-02-20 ENCOUNTER — Telehealth: Payer: Self-pay | Admitting: Internal Medicine

## 2021-02-20 DIAGNOSIS — R109 Unspecified abdominal pain: Secondary | ICD-10-CM | POA: Diagnosis not present

## 2021-02-20 DIAGNOSIS — R7989 Other specified abnormal findings of blood chemistry: Secondary | ICD-10-CM

## 2021-02-20 LAB — COMPREHENSIVE METABOLIC PANEL
ALT: 17 U/L (ref 0–35)
AST: 17 U/L (ref 0–37)
Albumin: 3.6 g/dL (ref 3.5–5.2)
Alkaline Phosphatase: 173 U/L — ABNORMAL HIGH (ref 39–117)
BUN: 23 mg/dL (ref 6–23)
CO2: 30 mEq/L (ref 19–32)
Calcium: 9 mg/dL (ref 8.4–10.5)
Chloride: 107 mEq/L (ref 96–112)
Creatinine, Ser: 0.9 mg/dL (ref 0.40–1.20)
GFR: 65 mL/min (ref >60.00)
Glucose, Bld: 88 mg/dL (ref 70–99)
Potassium: 4.1 mEq/L (ref 3.5–5.1)
Sodium: 143 mEq/L (ref 135–145)
Total Bilirubin: 0.4 mg/dL (ref 0.2–1.2)
Total Protein: 6.6 g/dL (ref 6.0–8.3)

## 2021-02-20 LAB — CBC WITH DIFFERENTIAL/PLATELET
Basophils Absolute: 0 10*3/uL (ref 0.0–0.1)
Basophils Relative: 0.4 % (ref 0.0–3.0)
Eosinophils Absolute: 0.1 10*3/uL (ref 0.0–0.7)
Eosinophils Relative: 2.7 % (ref 0.0–5.0)
HCT: 33.7 % — ABNORMAL LOW (ref 36.0–46.0)
Hemoglobin: 11 g/dL — ABNORMAL LOW (ref 12.0–15.0)
Lymphocytes Relative: 23.4 % (ref 12.0–46.0)
Lymphs Abs: 1 10*3/uL (ref 0.7–4.0)
MCHC: 32.6 g/dL (ref 30.0–36.0)
MCV: 89.6 fl (ref 78.0–100.0)
Monocytes Absolute: 0.4 10*3/uL (ref 0.1–1.0)
Monocytes Relative: 8.5 % (ref 3.0–12.0)
Neutro Abs: 2.9 10*3/uL (ref 1.4–7.7)
Neutrophils Relative %: 65 % (ref 43.0–77.0)
Platelets: 228 10*3/uL (ref 150.0–400.0)
RBC: 3.77 Mil/uL — ABNORMAL LOW (ref 3.87–5.11)
RDW: 14.9 % (ref 11.5–15.5)
WBC: 4.5 10*3/uL (ref 4.0–10.5)

## 2021-02-20 NOTE — Telephone Encounter (Signed)
Patient wondering if Dr. Quay Burow still wants her to get a MRI and a CT scan

## 2021-02-23 NOTE — Telephone Encounter (Signed)
I ordered the [place] to evaluate the liver.  She had a Ct scan in the ED.  Not sure what the mri is about.  She comes later this week for f/u.

## 2021-02-24 NOTE — Progress Notes (Signed)
Subjective:    Patient ID: Cynthia Kirk, female    DOB: [birth year], 70 y.o.   MRN: [id number]  HPI The patient is here for follow up of her right side pain and follow up of her chronic medical problems, including htn, DM, gerd, anxiety, lumbar spinal stenosis, b/l Knee oa, obesity, asthma  I saw her 3/9 - f/u from ED for right side pain, dark urin..  Was taking duexis - in Ed had elevated lfts, cystitis.  Ct ab/pelvis - no acute process.  Treated for uti.  Still has r lower abd to back pan.  .     R side pain - it affects her getting up, sitting down and walking.  She has pain in her right side and it radiates to her right abdomen and it goes in the back.  When it goes in the back it feels liek she has not control in her back - like it weak. Getting in and out of bed causes the worse pain.  Even when getting up slower she has an electric like pain in that area and the pain can be severe.   She has difficulty sleeping.   The biofreeze helps.      Get nauseous sometimes when eating and can not eat much.  She feels her heartburn is controlled with her current medications.   Medications and allergies reviewed with patient and updated if appropriate.  Patient Active Problem List   Diagnosis Date Noted   Elevated LFTs 02/11/2021   Right flank pain 02/11/2021   Spasm of muscle of lower back 10/14/2020   Rosacea 04/30/2020   Bilateral primary osteoarthritis of knee 04/30/2020   Neck pain on left side 08/03/2018   Atrophic vaginitis 02/23/2018   Osteopenia 02/23/2018   Urinary incontinence 02/23/2018   Vasomotor rhinitis 07/26/2017   Anxiety 02/01/2017   Renal cyst 07/23/2016   Cervical disc disorder with radiculopathy of cervical region 03/13/2016   B12 deficiency    Cataract 02/25/2014   Glaucoma suspect 02/25/2014   Lumbar spinal stenosis 03/07/2013   Ejection fraction    Syncope    Severe obesity (BMI >= 40) ([place]) 09/14/2010   Essential hypertension  09/14/2010   GERD 08/22/2009   Pulmonary sarcoidosis ([place]) 06/30/2009   Diabetes mellitus ([place]) 06/30/2009   OSA on CPAP 06/30/2009   Upper airway cough syndrome 06/30/2009   Mild intermittent asthma 06/30/2009    Current Outpatient Medications on File Prior to Visit  Medication Sig Dispense Refill   albuterol (PROVENTIL) (2.5 MG/3ML) 0.083% nebulizer solution TAKE 3ML(2.5MG ) BY NEBULIZATION EVERY 6 HOURS AS NEEDED FOR SHORTNESS OF BREATH 120 mL 11   albuterol (VENTOLIN HFA) 108 (90 Base) MCG/ACT inhaler INHALE 2 PUFF  BY MOUTH  EVERY 6 HOUR as NEEDED FOR WHEEZING ,SHORTNESS OF BREATH 54 g 3   ALPRAZolam (XANAX) 0.5 MG tablet TAKE 1 TABLET BY MOUTH EVERY DAY AS NEEDED FOR ANXIETY 90 tablet 0   azelastine (ASTELIN) 0.1 % nasal spray Place 1 spray into both nostrils 2 (two) times daily. Use in each nostril as directed 30 mL 12   BD PEN NEEDLE NANO 2ND GEN 32G X 4 MM MISC USE AS DIRECTED 409 each 2   BYSTOLIC 5 MG tablet TAKE 1 TABLET DAILY 90 tablet 1   cyclobenzaprine (FLEXERIL) 10 MG tablet TAKE 1/2 TO 1 TABLET 3 TIMES DAILY AS NEEDED FOR MUSCLE SPASMS. 90 tablet 0   dexlansoprazole (DEXILANT) 60 MG capsule Take 1 capsule (60 mg total) by  mouth daily before breakfast. 30 capsule 0   estradiol (ESTRACE VAGINAL) 0.1 MG/GM vaginal cream Place 2 g vaginally daily. 42.5 g 2   famotidine (PEPCID) 40 MG tablet TAKE 1 TABLET BY MOUTH TWICE A DAY 60 tablet 0   fluticasone (FLOVENT HFA) 44 MCG/ACT inhaler Inhale 2 puffs into the lungs 2 (two) times daily. 3 Inhaler 3   gabapentin (NEURONTIN) 100 MG capsule TAKE 1 CAPSULE AT BEDTIME 90 capsule 7   hydroquinone 4 % cream Apply 1 application topically 2 (two) times daily. 28.35 g 0   ipratropium (ATROVENT) 0.06 % nasal spray Place 2 sprays into both nostrils 2 (two) times daily. 15 mL 2   METRONIDAZOLE, TOPICAL, 0.75 % LOTN Apply 1 application topically daily. 59 mL 5   montelukast (SINGULAIR) 10 MG tablet TAKE 1 TABLET BY MOUTH  EVERYDAY AT BEDTIME 90 tablet 1   olmesartan-hydrochlorothiazide ([name] HCT) 40-25 MG tablet Take 1 tablet by mouth daily. 90 tablet 1   triamcinolone ointment (KENALOG) 0.5 % APPLY TO AFFECTED AREA TWICE A DAY 30 g 0   VICTOZA 18 MG/3ML SOPN Inject into the skin.     No current facility-administered medications on file prior to visit.    Past Medical History:  Diagnosis Date   Abnormal Pap smear 2008   Adenomatous colon polyp    Allergic rhinitis, cause unspecified    Anxiety    ASCUS with positive high risk HPV    Blood transfusion 1976   Due to ectopic pregnancy   Chronic back pain    Ejection fraction    EF 45-50%, echo, October 27, 2011   Endometrial mass 2011   Esophageal reflux    Fibroid    H/O Clostridium difficile infection 03/2010   H/O menorrhagia 2011   Herpes simplex type 2 infection complicating pregnancy    Hiatal hernia    Iron deficiency anemia    Morbid obesity ([place]) 2010   Obstructive sleep apnea (adult) (pediatric)    cpap   Panic attacks    Plantar fasciitis    Pneumonia 2005; 2007; 2009; 2011   Sarcoidosis 1996   @ [facility]   Simple endometrial hyperplasia 2011   Spinal stenosis, lumbar    s/p decompression laminectomy 09/2013   Syncope    ?? Syncope ??   Type 2 diabetes, diet controlled ([place])    Unspecified asthma(493.90)    Urinary incontinence, mixed 2011   VIN II (vulvar intraepithelial neoplasia II)     Past Surgical History:  Procedure Laterality Date   COLPOSCOPY VULVA W/ BIOPSY     "I've had maybe 3" (09/05/2013)   DILATION AND CURETTAGE OF UTERUS  2007   [place]; ~ 1978   HYSTEROSCOPY DIAGNOSTIC  2011   LUMBAR LAMINECTOMY/DECOMPRESSION MICRODISCECTOMY N/A 09/05/2013   Procedure: Thoracic eleven-twelve Thoracic Laminectomy;  Surgeon: Eustace Moore, MD;  Location: [place] [facility];  Service: Neurosurgery;  Laterality: N/A;  Thoracic eleven-twelve Thoracic Laminectomy   MAXIMUM  ACCESS (MAS)POSTERIOR LUMBAR INTERBODY FUSION (PLIF) 1 LEVEL N/A 09/05/2013   Procedure: Lumbar four-five Maximum Access Surgery  Posterior lumbar interbody fusion;  Surgeon: Eustace Moore, MD;  Location: [place] [facility];  Service: Neurosurgery;  Laterality: N/A;  Lumbar four-five Maximum Access Surgery  Posterior lumbar interbody fusion   POSTERIOR LAMINECTOMY / DECOMPRESSION LUMBAR SPINE  09/05/2013   POSTERIOR LUMBAR FUSION  09/05/2013   THORACIC LAMINECTOMY  09/05/2013   URETHRAL SLING  2011   VULVECTOMY  ~ 2007   "  for cancerous cells" (09/05/2013)    Social History   Socioeconomic History   Marital status: Married    Spouse name: Not on file   Number of children: 2   Years of education: PHD   Highest education level: Not on file  Occupational History   Occupation: retired    Fish farm manager: UNEMPLOYED  Tobacco Use   Smoking status: Never Smoker   Smokeless tobacco: Never Used  Scientific laboratory technician Use: Never used  Substance and Sexual Activity   Alcohol use: Not Currently    Comment: 2018 - very occasionally   Drug use: No   Sexual activity: Yes    Birth control/protection: Post-menopausal  Other Topics Concern   Not on file  Social History Narrative   Pt lives at home with her spouse.   She does not use caffeine.   Social Determinants of Health   Financial Resource Strain: Low Risk    Difficulty of Paying Living Expenses: Not hard at all  Food Insecurity: Not on file  Transportation Needs: Not on file  Physical Activity: Not on file  Stress: Not on file  Social Connections: Not on file    Family History  Problem Relation Age of Onset   Heart attack Father    Hypertension Father    Diabetes Father    Seizures Father    Migraines Father    Kidney disease Sister    Osteoporosis Sister    Heart attack Sister    Cancer Paternal Uncle    Asthma Daughter    Asthma Son    Osteoporosis Sister    Lung cancer Maternal Uncle    Kidney disease  Other    Diabetes Other    High Cholesterol Brother        x2    Review of Systems  Constitutional: Negative for fever.  Gastrointestinal: Positive for nausea. Negative for abdominal pain, blood in stool, constipation and diarrhea.  Skin: Negative for rash.  Neurological: Negative for numbness.       Objective:   Vitals:   02/25/21 0938  BP: 128/76  Pulse: 80  Temp: 98.2 F (36.8 C)  SpO2: 99%   BP Readings from Last 3 Encounters:  02/25/21 128/76  10/07/20 110/64  08/14/20 118/77   Wt Readings from Last 3 Encounters:  02/25/21 248 lb (112.5 kg)  10/07/20 253 lb 9.6 oz (115 kg)  08/14/20 249 lb 11.2 oz (113.3 kg)   Body mass index is 41.91 kg/m.   Physical Exam    Constitutional: Appears well-developed and well-nourished. No distress.  HENT:  Head: Normocephalic and atraumatic.  Neck: Neck supple. No tracheal deviation present. No thyromegaly present.  No cervical lymphadenopathy Cardiovascular: Normal rate, regular rhythm and normal heart sounds.   No murmur heard. No carotid bruit .  No edema Pulmonary/Chest: Effort normal and breath sounds normal. No respiratory distress. No has no wheezes. No rales.  Abdomen: Obese soft, NT, ND Musculoskeletal: Tenderness with palpation right mid back and right side-tenderness somewhat out of proportion Skin: Skin is warm and dry. Not diaphoretic.  Psychiatric: Normal mood and affect. Behavior is normal.      Assessment & Plan:    See Problem List for Assessment and Plan of chronic medical problems.    This visit occurred during the SARS-CoV-2 public health emergency.  Safety protocols were in place, including screening questions prior to the visit, additional usage of staff PPE, and extensive cleaning of exam room while observing appropriate contact time  as indicated for disinfecting solutions.

## 2021-02-24 NOTE — Telephone Encounter (Signed)
Message left for patient today 

## 2021-02-24 NOTE — Patient Instructions (Addendum)
  Medications changes include :   Flexeril for the muscle pain  Your prescription(s) have been submitted to your pharmacy. Please take as directed and contact our office if you believe you are having problem(s) with the medication(s).    Please followup in 6 months

## 2021-02-25 ENCOUNTER — Other Ambulatory Visit: Payer: Self-pay

## 2021-02-25 ENCOUNTER — Ambulatory Visit (INDEPENDENT_AMBULATORY_CARE_PROVIDER_SITE_OTHER): Payer: Medicare Other | Admitting: Internal Medicine

## 2021-02-25 ENCOUNTER — Encounter: Payer: Self-pay | Admitting: Internal Medicine

## 2021-02-25 VITALS — BP 128/76 | HR 80 | Temp 98.2°F | Wt 248.0 lb

## 2021-02-25 DIAGNOSIS — M48061 Spinal stenosis, lumbar region without neurogenic claudication: Secondary | ICD-10-CM

## 2021-02-25 DIAGNOSIS — R7989 Other specified abnormal findings of blood chemistry: Secondary | ICD-10-CM | POA: Diagnosis not present

## 2021-02-25 DIAGNOSIS — J4520 Mild intermittent asthma, uncomplicated: Secondary | ICD-10-CM

## 2021-02-25 DIAGNOSIS — I10 Essential (primary) hypertension: Secondary | ICD-10-CM

## 2021-02-25 DIAGNOSIS — E1169 Type 2 diabetes mellitus with other specified complication: Secondary | ICD-10-CM | POA: Diagnosis not present

## 2021-02-25 DIAGNOSIS — M549 Dorsalgia, unspecified: Secondary | ICD-10-CM | POA: Diagnosis not present

## 2021-02-25 DIAGNOSIS — M62830 Muscle spasm of back: Secondary | ICD-10-CM | POA: Diagnosis not present

## 2021-02-25 MED ORDER — HYDROQUINONE 4 % EX CREA: 1.0000 "application " | TOPICAL_CREAM | Freq: Two times a day (BID) | CUTANEOUS | 0 refills | Status: DC

## 2021-02-25 MED ORDER — CYCLOBENZAPRINE HCL 10 MG PO TABS: 5.0000 mg | ORAL_TABLET | Freq: Three times a day (TID) | ORAL | 0 refills | Status: DC | PRN

## 2021-02-25 MED ORDER — ALPRAZOLAM 0.5 MG PO TABS: ORAL_TABLET | ORAL | 0 refills | Status: DC

## 2021-02-25 NOTE — Assessment & Plan Note (Signed)
Chronic Controlled Just restarted Victoza-more so to help her with weight loss

## 2021-02-25 NOTE — Assessment & Plan Note (Signed)
Chronic Takes Flexeril as needed-okay to continue-refill sent to pharmacy

## 2021-02-25 NOTE — Assessment & Plan Note (Addendum)
Resolved Likely related to Duexis CT of liver was normal and pain not characteristic of gallbladder

## 2021-02-25 NOTE — Assessment & Plan Note (Addendum)
Chronic BP well controlled Continue Bystolic 5 mg daily, [name] HCT 40-25 mg daily

## 2021-02-25 NOTE — Assessment & Plan Note (Signed)
She has been experiencing right side and right back pain.  This often radiates around towards the right abdomen She was taking ibuprofen or Duexis for this and that was helping, but she developed a hepatitis secondary to the Duexis She still takes ibuprofen on its own and that does help, but limits it Worse with movement and certain positions, better at rest Does sound musculoskeletal in nature-she will see orthopedics for further evaluation Trial of Flexeril 5-10 mg 3 times daily as needed Discussed heat, ice and topical medications Reviewed CT scan-no gallbladder disease, liver normal, no kidney disease or stones

## 2021-02-26 ENCOUNTER — Encounter: Payer: Self-pay | Admitting: Internal Medicine

## 2021-02-26 ENCOUNTER — Telehealth: Payer: Self-pay | Admitting: Internal Medicine

## 2021-02-26 NOTE — Telephone Encounter (Signed)
Patient calling to request prior auth for cyclobenzaprine (FLEXERIL) 10 MG tablet per pharmacy

## 2021-02-27 NOTE — Telephone Encounter (Signed)
Chevella Sparger (Key: [id number])  Your information has been submitted to [place] [employer] Part D. [employer] will review the request and will issue a decision, typically within 1-3 days from your submission. You can check the updated outcome later by reopening this request.  If [employer] has not responded in 1-3 days or if you have any questions about your ePA request, please contact [place] Medicare Part D at [phone]. If you think there may be a problem with your PA request, use our live chat feature at the bottom right.

## 2021-02-27 NOTE — Telephone Encounter (Signed)
Sent my-chart message to patient. Unable to reach her with number in message.

## 2021-02-27 NOTE — Telephone Encounter (Signed)
Cynthia Kirk (Key: [id number])  This request has been approved.  Please note any additional information provided by [employer] D at the bottom of your screen.

## 2021-03-02 ENCOUNTER — Encounter: Payer: Self-pay | Admitting: Internal Medicine

## 2021-03-03 ENCOUNTER — Encounter: Payer: Self-pay | Admitting: Gastroenterology

## 2021-03-03 ENCOUNTER — Ambulatory Visit (INDEPENDENT_AMBULATORY_CARE_PROVIDER_SITE_OTHER): Payer: Medicare Other | Admitting: Gastroenterology

## 2021-03-03 ENCOUNTER — Other Ambulatory Visit: Payer: Medicare Other

## 2021-03-03 VITALS — BP 132/84 | HR 85 | Ht 63.0 in | Wt 246.0 lb

## 2021-03-03 DIAGNOSIS — R748 Abnormal levels of other serum enzymes: Secondary | ICD-10-CM

## 2021-03-03 DIAGNOSIS — M546 Pain in thoracic spine: Secondary | ICD-10-CM | POA: Diagnosis not present

## 2021-03-03 DIAGNOSIS — K219 Gastro-esophageal reflux disease without esophagitis: Secondary | ICD-10-CM

## 2021-03-03 MED ORDER — DEXLANSOPRAZOLE 60 MG PO CPDR: 60.0000 mg | DELAYED_RELEASE_CAPSULE | Freq: Every day | ORAL | 3 refills | Status: DC

## 2021-03-03 MED ORDER — FAMOTIDINE 40 MG PO TABS: 40.0000 mg | ORAL_TABLET | Freq: Two times a day (BID) | ORAL | 3 refills | Status: DC

## 2021-03-03 MED ORDER — LIRAGLUTIDE 18 MG/3ML ~~LOC~~ SOPN: 1.8000 mg | PEN_INJECTOR | Freq: Every day | SUBCUTANEOUS | 1 refills | Status: DC

## 2021-03-03 NOTE — Progress Notes (Signed)
    History of Present Illness: This is a [age] year old female with right thoracic back pain for several months.  In addition she has had a persistently elevated alkaline phosphatase.  ED visit for worsening right flank/back pain on January 23, 2021 with AST and ALT mildly elevated at 112 and 59 respectively.  Alk phos was elevated at 306.  Repeat LFTs on February 20, 2021 showed an alk phos of 173 and other LFTs are normal.  Epic review shows a mild alk phos elevation that dates back to October 2019.  It was normal prior to that.  CT AP wo IV contrast on January 23, 2021 in care everywhere was unremarkable. Liver and gallbladder were normal. Diverticulosis without diverticulitis and moderate retained stool.  Degenerative bone changes were noted and a posterior fusion at L4-L5 was noted.  Her reflux symptoms are currently under good control.  Current Medications, Allergies, Past Medical History, Past Surgical History, Family History and Social History were reviewed in [employer] record.   Physical Exam: General: Well developed, well nourished, no acute distress Head: Normocephalic and atraumatic Eyes: Sclerae anicteric, EOMI Ears: Normal auditory acuity Mouth: Not examined, mask on during Covid-19 pandemic Lungs: Clear throughout to auscultation Heart: Regular rate and rhythm; no murmurs, rubs or bruits Abdomen: Soft, non tender and non distended. No masses, hepatosplenomegaly or hernias noted. Normal Bowel sounds Back: Tenderness in right mid thoracic area over lower ribs. Rectal: Not done Musculoskeletal: Symmetrical with no gross deformities  Pulses:  Normal pulses noted Extremities: No clubbing, cyanosis, edema or deformities noted Neurological: Alert oriented x 4, grossly nonfocal Psychological:  Alert and cooperative. Normal mood and affect   Assessment and Recommendations:  1. GERD, currently well controlled.  Closely follow antireflux measures.  Continue  Dexilant 60 mg p.o. every morning and famotidine 40 mg p.o. twice daily.  2. Elevated alk phos. Fractionate alk phos, alkaline phosphatase isoenzymes.  Trend LFTs.   3. Right thoracic back pain.  Tenderness over right lower posterior ribs.  This does not appear to be GI related.  Follow-up with PCP.  4.  CRC screening, average risk.  She is due for a 10-year interval screening colonoscopy in October 2022.

## 2021-03-03 NOTE — Patient Instructions (Signed)
Your provider has requested that you go to the basement level for lab work before leaving today. Press "B" on the elevator. The lab is located at the first door on the left as you exit the elevator.  We have sent the following medications to your pharmacy for you to pick up at your convenience: Dexilant and famotidine.   Follow up with your primary care physician for your back pain.   Due to recent changes in healthcare laws, you may see the results of your imaging and laboratory studies on MyChart before your provider has had a chance to review them.  We understand that in some cases there may be results that are confusing or concerning to you. Not all laboratory results come back in the same time frame and the provider may be waiting for multiple results in order to interpret others.  Please give [place] 48 hours in order for your provider to thoroughly review all the results before contacting the office for clarification of your results.   Thank you for choosing me and [facility].  Pricilla Riffle. Dagoberto Ligas., MD., [facility]al

## 2021-03-06 ENCOUNTER — Other Ambulatory Visit: Payer: Self-pay

## 2021-03-06 DIAGNOSIS — K219 Gastro-esophageal reflux disease without esophagitis: Secondary | ICD-10-CM

## 2021-03-06 DIAGNOSIS — R748 Abnormal levels of other serum enzymes: Secondary | ICD-10-CM

## 2021-03-06 LAB — ALKALINE PHOSPHATASE, ISOENZYMES
Alkaline Phosphatase: 236 IU/L — ABNORMAL HIGH (ref 44–121)
BONE FRACTION: 29 % (ref 14–68)
INTESTINAL FRAC.: 2 % (ref 0–18)
LIVER FRACTION: 69 % (ref 18–85)

## 2021-03-09 ENCOUNTER — Other Ambulatory Visit: Payer: Self-pay | Admitting: Internal Medicine

## 2021-03-09 ENCOUNTER — Other Ambulatory Visit (HOSPITAL_COMMUNITY): Payer: Self-pay | Admitting: Internal Medicine

## 2021-03-09 DIAGNOSIS — R7989 Other specified abnormal findings of blood chemistry: Secondary | ICD-10-CM

## 2021-03-11 ENCOUNTER — Other Ambulatory Visit: Payer: Self-pay

## 2021-03-11 ENCOUNTER — Ambulatory Visit (HOSPITAL_COMMUNITY)
Admission: RE | Admit: 2021-03-11 | Discharge: 2021-03-11 | Disposition: A | Discharge status: Home / Self Care | Payer: Medicare Other | Source: Ambulatory Visit | Attending: Internal Medicine | Admitting: Internal Medicine

## 2021-03-11 DIAGNOSIS — R7989 Other specified abnormal findings of blood chemistry: Secondary | ICD-10-CM | POA: Diagnosis not present

## 2021-03-11 DIAGNOSIS — K8020 Calculus of gallbladder without cholecystitis without obstruction: Secondary | ICD-10-CM | POA: Diagnosis not present

## 2021-03-12 ENCOUNTER — Encounter: Payer: Self-pay | Admitting: Internal Medicine

## 2021-03-12 DIAGNOSIS — K8020 Calculus of gallbladder without cholecystitis without obstruction: Secondary | ICD-10-CM | POA: Insufficient documentation

## 2021-03-13 ENCOUNTER — Encounter: Payer: Self-pay | Admitting: Internal Medicine

## 2021-03-13 DIAGNOSIS — K8020 Calculus of gallbladder without cholecystitis without obstruction: Secondary | ICD-10-CM

## 2021-03-16 ENCOUNTER — Telehealth: Payer: Self-pay | Admitting: Gastroenterology

## 2021-03-16 DIAGNOSIS — R748 Abnormal levels of other serum enzymes: Secondary | ICD-10-CM

## 2021-03-16 NOTE — Telephone Encounter (Signed)
Patient notified of Dr. Lynne Leader response.  She will come for labs next week.

## 2021-03-16 NOTE — Telephone Encounter (Signed)
Pt called stating that she had [place] last week that was ordered by her PCP. However, he wanted pt to have the same imaging. Pt would like for Dr. Fuller Plan to review results and tell her his opinion and if it is the same to recommend a surgeon.

## 2021-03-16 NOTE — Telephone Encounter (Signed)
RUQ US shows: Cholelithiasis and mild CBD dilation (42mm) This could be the cause of the episode of right flank pain and her alk phos elevation Recommend repeat LFTs this week  Agree with [facility] surgical referral as initiated by Dr. Quay Burow

## 2021-03-16 NOTE — Telephone Encounter (Signed)
Dr. Fuller Plan, Please see the [place] results.  Also ordered by Dr. Quay Burow.  They have initiated surgical referral.

## 2021-03-17 ENCOUNTER — Telehealth: Payer: Self-pay | Admitting: Pharmacist

## 2021-03-17 ENCOUNTER — Other Ambulatory Visit (HOSPITAL_COMMUNITY): Payer: Medicare Other

## 2021-03-19 NOTE — Progress Notes (Signed)
Chronic Care Management Pharmacy Assistant   Name: Cynthia Kirk  MRN: [id number] DOB: 01/27/1951   Reason for Encounter: Hypertension Disease State Call   Conditions to be addressed/monitored: HTN   Recent office visits:  02/11/21 Dr. Quay Burow video visit, no medication changes 02/25/21 Dr. Quay Burow, discontinued Duexis 800 mg  Recent consult visits: 03/03/21 Dr. Dola Factor medication changes  Hospital visits:  Medication Reconciliation was completed by comparing discharge summary, patient's EMR and Pharmacy list, and upon discussion with patient.  Admitted to the hospital on 01/23/21 due to right side flank pain with fever and dark urine. Discharge date was 01/23/21. Discharged from [place]?Medications Started at [facility] Discharge:?? -started fluconazole 150 mg, methocarbamol 500 mg, and cephalexin 500 mg    Medication Changes at Hospital Discharge: -Changed None ID  Medications Discontinued at Hospital Discharge: -Stopped None ID  Medications that remain the same after Hospital Discharge:??  -All other medications will remain the same.    Medications: Outpatient Encounter Medications as of 03/17/2021  Medication Sig  . albuterol (PROVENTIL) (2.5 MG/3ML) 0.083% nebulizer solution TAKE 3ML(2.5MG ) BY NEBULIZATION EVERY 6 HOURS AS NEEDED FOR SHORTNESS OF BREATH  . albuterol (VENTOLIN HFA) 108 (90 Base) MCG/ACT inhaler INHALE 2 PUFF&nbsp;&nbsp;BY MOUTH&nbsp;&nbsp;EVERY 6 HOUR as NEEDED FOR WHEEZING ,SHORTNESS OF BREATH  . ALPRAZolam (XANAX) 0.5 MG tablet TAKE 1 TABLET BY MOUTH EVERY DAY AS NEEDED FOR ANXIETY  . azelastine (ASTELIN) 0.1 % nasal spray Place 1 spray into both nostrils 2 (two) times daily. Use in each nostril as directed  . BD PEN NEEDLE NANO 2ND GEN 32G X 4 MM MISC USE AS DIRECTED  . BYSTOLIC 5 MG tablet TAKE 1 TABLET DAILY  . cyclobenzaprine (FLEXERIL) 10 MG tablet Take 0.5-1 tablets (5-10 mg total) by mouth 3 (three) times daily as needed for  muscle spasms.  [name] dexlansoprazole (DEXILANT) 60 MG capsule Take 1 capsule (60 mg total) by mouth daily before breakfast.  . estradiol (ESTRACE VAGINAL) 0.1 MG/GM vaginal cream Place 2 g vaginally daily.  . famotidine (PEPCID) 40 MG tablet Take 1 tablet (40 mg total) by mouth 2 (two) times daily.  . fluticasone (FLOVENT HFA) 44 MCG/ACT inhaler Inhale 2 puffs into the lungs 2 (two) times daily.  [name] gabapentin (NEURONTIN) 100 MG capsule TAKE 1 CAPSULE AT BEDTIME  . hydroquinone 4 % cream Apply 1 application topically 2 (two) times daily.  [name] ipratropium (ATROVENT) 0.06 % nasal spray Place 2 sprays into both nostrils 2 (two) times daily.  [name] liraglutide (VICTOZA) 18 MG/3ML SOPN Inject 1.8 mg into the skin daily.  [name] METRONIDAZOLE, TOPICAL, 0.75 % LOTN Apply 1 application topically daily.  . montelukast (SINGULAIR) 10 MG tablet TAKE 1 TABLET BY MOUTH EVERYDAY AT BEDTIME  . olmesartan-hydrochlorothiazide ([name] HCT) 40-25 MG tablet Take 1 tablet by mouth daily.  [name] triamcinolone ointment (KENALOG) 0.5 % APPLY TO AFFECTED AREA TWICE A DAY   No facility-administered encounter medications on file as of 03/17/2021.   Reviewed chart prior to disease state call. Spoke with patient regarding BP  Recent Office Vitals: BP Readings from Last 3 Encounters:  03/03/21 132/84  02/25/21 128/76  10/07/20 110/64   Pulse Readings from Last 3 Encounters:  03/03/21 85  02/25/21 80  10/07/20 80    Wt Readings from Last 3 Encounters:  03/03/21 246 lb (111.6 kg)  02/25/21 248 lb (112.5 kg)  10/07/20 253 lb 9.6 oz (115 kg)     Kidney Function Lab Results  Component Value Date/Time   CREATININE 0.90 02/20/2021 11:47 AM   CREATININE 0.89 04/30/2020 02:59 PM   GFR 65.00 02/20/2021 11:47 AM   GFRNONAA >60 12/27/2017 02:19 PM   GFRAA >60 12/27/2017 02:19 PM    BMP Latest Ref Rng & Units 02/20/2021 04/30/2020 09/20/2019  Glucose 70 - 99 mg/dL 88 82 86  BUN 6 - 23 mg/dL 23 21 22   Creatinine 0.40 - 1.20 mg/dL  0.90 0.89 0.89  Sodium 135 - 145 mEq/L 143 140 137  Potassium 3.5 - 5.1 mEq/L 4.1 3.9 3.5  Chloride 96 - 112 mEq/L 107 101 100  CO2 19 - 32 mEq/L 30 31 28   Calcium 8.4 - 10.5 mg/dL 9.0 9.1 9.5    . Current antihypertensive regimen: Patient states that she takes Bystolic 5 mg 1 tab daily, and olmesartan-hctz 40-25 mg 1 tab daily  . How often are you checking your Blood Pressure? The patient states that she checks her blood pressure regularly  . Current home BP readings: The patient states that yesterday her blood pressure was 128/72  . What recent interventions/DTPs have been made by any provider to improve Blood Pressure control since last CPP Visit: None ID  . Any recent hospitalizations or ED visits since last visit with CPP? The patient had an ED visit on 01/23/21 for right flank pain and dark urine  . What diet changes have been made to improve Blood Pressure Control? The patient states that she stopped eating fried foods, watches what she eats  . What exercise is being done to improve your Blood Pressure Control? The patient is active at home   Adherence Review: Is the patient currently on ACE/ARB medication? Yes Olmesartan-HCTZ Does the patient have >5 day gap between last estimated fill dates? No   Star Rating Drugs: Olmesartan-HCTZ, pt stated she called for refill 03/18/21 90 ds  Ethelene Hal Clinical Pharmacist Assistant [phone]  Time spent:43

## 2021-04-15 ENCOUNTER — Other Ambulatory Visit: Payer: Self-pay | Admitting: Gastroenterology

## 2021-04-15 NOTE — Telephone Encounter (Signed)
Pharmacy states Dexilant is no longer covered and patient wants to be switched to different PPI.

## 2021-04-22 ENCOUNTER — Telehealth: Payer: Self-pay | Admitting: Gastroenterology

## 2021-04-22 MED ORDER — DEXLANSOPRAZOLE 60 MG PO CPDR: 60.0000 mg | DELAYED_RELEASE_CAPSULE | Freq: Every day | ORAL | 3 refills | Status: DC

## 2021-04-22 NOTE — Telephone Encounter (Signed)
Informed patient that [facility] had reached out to me and states patient's Dexilant was not covered any longer and we need to switch to one of the insurance approved alternatives.  Informed patient that the pharmacy had explained that pantoprazole was a preferred alternative and they would contact you back directly. Patient states Dexilant is still covered and she got a refill just last week of a 90 day supply. Apologized to patient that if I had known they would not contact her, I would have called patient directly before sending in medication. Patient states that this particular pharmacy has made a lot recent multiple mistakes. Patient states she just wanted to clarify that Dr. Fuller Plan did not want her to be switched. Informed patient to continue Dexilant if this works the best to control her acid reflux. Patient informs it does work the best.

## 2021-05-02 ENCOUNTER — Other Ambulatory Visit: Payer: Self-pay | Admitting: Internal Medicine

## 2021-05-02 DIAGNOSIS — M62830 Muscle spasm of back: Secondary | ICD-10-CM

## 2021-05-19 ENCOUNTER — Telehealth: Payer: Self-pay | Admitting: Pharmacist

## 2021-05-19 NOTE — Progress Notes (Signed)
Chronic Care Management Pharmacy Assistant   Name: Cynthia Kirk  MRN: [id number] DOB: May 14, 1951    Reason for Encounter: Disease State   Conditions to be addressed/monitored: HTN  Primary concerns for visit include: Blood Pressure   Recent office visits:  None ID  Recent consult visits:  None ID  Hospital visits:  None in previous 6 months  Medications: Outpatient Encounter Medications as of 05/19/2021  Medication Sig   albuterol (PROVENTIL) (2.5 MG/3ML) 0.083% nebulizer solution TAKE 3ML(2.5MG ) BY NEBULIZATION EVERY 6 HOURS AS NEEDED FOR SHORTNESS OF BREATH   albuterol (VENTOLIN HFA) 108 (90 Base) MCG/ACT inhaler INHALE 2 PUFF&nbsp;&nbsp;BY MOUTH&nbsp;&nbsp;EVERY 6 HOUR as NEEDED FOR WHEEZING ,SHORTNESS OF BREATH   ALPRAZolam (XANAX) 0.5 MG tablet TAKE 1 TABLET BY MOUTH EVERY DAY AS NEEDED FOR ANXIETY   azelastine (ASTELIN) 0.1 % nasal spray Place 1 spray into both nostrils 2 (two) times daily. Use in each nostril as directed   BD PEN NEEDLE NANO 2ND GEN 32G X 4 MM MISC USE AS DIRECTED   BYSTOLIC 5 MG tablet TAKE 1 TABLET DAILY   cyclobenzaprine (FLEXERIL) 10 MG tablet TAKE 1/2 TO 1 TABLET BY MOUTH 3 TIMES DAILY AS NEEDED FOR MUSCLE SPASMS.   dexlansoprazole (DEXILANT) 60 MG capsule Take 1 capsule (60 mg total) by mouth daily.   estradiol (ESTRACE VAGINAL) 0.1 MG/GM vaginal cream Place 2 g vaginally daily.   famotidine (PEPCID) 40 MG tablet Take 1 tablet (40 mg total) by mouth 2 (two) times daily.   fluticasone (FLOVENT HFA) 44 MCG/ACT inhaler Inhale 2 puffs into the lungs 2 (two) times daily.   gabapentin (NEURONTIN) 100 MG capsule TAKE 1 CAPSULE AT BEDTIME   hydroquinone 4 % cream Apply 1 application topically 2 (two) times daily.   ipratropium (ATROVENT) 0.06 % nasal spray Place 2 sprays into both nostrils 2 (two) times daily.   liraglutide (VICTOZA) 18 MG/3ML SOPN Inject 1.8 mg into the skin daily.   METRONIDAZOLE, TOPICAL, 0.75 % LOTN Apply 1 application topically  daily.   montelukast (SINGULAIR) 10 MG tablet TAKE 1 TABLET BY MOUTH EVERYDAY AT BEDTIME   olmesartan-hydrochlorothiazide ([name] HCT) 40-25 MG tablet Take 1 tablet by mouth daily.   triamcinolone ointment (KENALOG) 0.5 % APPLY TO AFFECTED AREA TWICE A DAY   No facility-administered encounter medications on file as of 05/19/2021.    Pharmacist Review  Reviewed chart prior to disease state call. Spoke with patient regarding BP  Recent Office Vitals: BP Readings from Last 3 Encounters:  03/03/21 132/84  02/25/21 128/76  10/07/20 110/64   Pulse Readings from Last 3 Encounters:  03/03/21 85  02/25/21 80  10/07/20 80    Wt Readings from Last 3 Encounters:  03/03/21 246 lb (111.6 kg)  02/25/21 248 lb (112.5 kg)  10/07/20 253 lb 9.6 oz (115 kg)     Kidney Function Lab Results  Component Value Date/Time   CREATININE 0.90 02/20/2021 11:47 AM   CREATININE 0.89 04/30/2020 02:59 PM   GFR 65.00 02/20/2021 11:47 AM   GFRNONAA >60 12/27/2017 02:19 PM   GFRAA >60 12/27/2017 02:19 PM    BMP Latest Ref Rng & Units 02/20/2021 04/30/2020 09/20/2019  Glucose 70 - 99 mg/dL 88 82 86  BUN 6 - 23 mg/dL 23 21 22   Creatinine 0.40 - 1.20 mg/dL 0.90 0.89 0.89  Sodium 135 - 145 mEq/L 143 140 137  Potassium 3.5 - 5.1 mEq/L 4.1 3.9 3.5  Chloride 96 - 112 mEq/L 107 101 100  CO2  19 - 32 mEq/L 30 31 28   Calcium 8.4 - 10.5 mg/dL 9.0 9.1 9.5    Current antihypertensive regimen:  Bystolic 5 mg 1 tab daily olmesartan-hctz 40-25 mg 1 tab daily How often are you checking your Blood Pressure? infrequently, patient states that blood pressure is usually low so she does not check  Current home BP readings: Patient states that her blood pressure about 2 weeks ago was 109/70  What recent interventions/DTPs have been made by any provider to improve Blood Pressure control since last CPP Visit: Continue current medications per Dr. Quay Burow 02/04/21  Any recent hospitalizations or ED visits since last visit with  CPP? No  What diet changes have been made to improve Blood Pressure Control?  Patient states that she has not made any changes to diet  What exercise is being done to improve your Blood Pressure Control?  Patient states that she tries to stay active around the house  Adherence Review: Is the patient currently on ACE/ARB medication? Yes Does the patient have >5 day gap between last estimated fill dates? No   Star Rating Drugs: Olmesartan HCTZ 04/15/21 90 ds  Time spent:37

## 2021-06-03 DIAGNOSIS — M8580 Other specified disorders of bone density and structure, unspecified site: Secondary | ICD-10-CM | POA: Diagnosis not present

## 2021-06-03 DIAGNOSIS — Z0000 Encounter for general adult medical examination without abnormal findings: Secondary | ICD-10-CM | POA: Diagnosis not present

## 2021-06-03 DIAGNOSIS — L309 Dermatitis, unspecified: Secondary | ICD-10-CM | POA: Diagnosis not present

## 2021-06-03 DIAGNOSIS — J4520 Mild intermittent asthma, uncomplicated: Secondary | ICD-10-CM | POA: Diagnosis not present

## 2021-06-03 DIAGNOSIS — F410 Panic disorder [episodic paroxysmal anxiety] without agoraphobia: Secondary | ICD-10-CM | POA: Diagnosis not present

## 2021-06-03 DIAGNOSIS — I10 Essential (primary) hypertension: Secondary | ICD-10-CM | POA: Diagnosis not present

## 2021-06-03 DIAGNOSIS — D869 Sarcoidosis, unspecified: Secondary | ICD-10-CM | POA: Diagnosis not present

## 2021-06-03 DIAGNOSIS — R7401 Elevation of levels of liver transaminase levels: Secondary | ICD-10-CM | POA: Diagnosis not present

## 2021-06-03 DIAGNOSIS — E119 Type 2 diabetes mellitus without complications: Secondary | ICD-10-CM | POA: Diagnosis not present

## 2021-06-03 DIAGNOSIS — Z1231 Encounter for screening mammogram for malignant neoplasm of breast: Secondary | ICD-10-CM | POA: Diagnosis not present

## 2021-06-09 ENCOUNTER — Other Ambulatory Visit: Payer: Self-pay | Admitting: Internal Medicine

## 2021-06-11 DIAGNOSIS — R7401 Elevation of levels of liver transaminase levels: Secondary | ICD-10-CM | POA: Diagnosis not present

## 2021-06-11 DIAGNOSIS — K760 Fatty (change of) liver, not elsewhere classified: Secondary | ICD-10-CM | POA: Diagnosis not present

## 2021-06-11 DIAGNOSIS — E119 Type 2 diabetes mellitus without complications: Secondary | ICD-10-CM | POA: Diagnosis not present

## 2021-06-11 DIAGNOSIS — K8020 Calculus of gallbladder without cholecystitis without obstruction: Secondary | ICD-10-CM | POA: Diagnosis not present

## 2021-06-18 DIAGNOSIS — Z1231 Encounter for screening mammogram for malignant neoplasm of breast: Secondary | ICD-10-CM | POA: Diagnosis not present

## 2021-06-18 DIAGNOSIS — M25561 Pain in right knee: Secondary | ICD-10-CM | POA: Diagnosis not present

## 2021-06-18 DIAGNOSIS — E119 Type 2 diabetes mellitus without complications: Secondary | ICD-10-CM | POA: Diagnosis not present

## 2021-06-18 DIAGNOSIS — M25562 Pain in left knee: Secondary | ICD-10-CM | POA: Diagnosis not present

## 2021-06-18 DIAGNOSIS — R7401 Elevation of levels of liver transaminase levels: Secondary | ICD-10-CM | POA: Diagnosis not present

## 2021-06-18 DIAGNOSIS — K219 Gastro-esophageal reflux disease without esophagitis: Secondary | ICD-10-CM | POA: Diagnosis not present

## 2021-06-18 DIAGNOSIS — N39498 Other specified urinary incontinence: Secondary | ICD-10-CM | POA: Diagnosis not present

## 2021-06-18 DIAGNOSIS — G8929 Other chronic pain: Secondary | ICD-10-CM | POA: Diagnosis not present

## 2021-06-18 DIAGNOSIS — I10 Essential (primary) hypertension: Secondary | ICD-10-CM | POA: Diagnosis not present

## 2021-06-30 ENCOUNTER — Telehealth: Payer: Self-pay | Admitting: Gastroenterology

## 2021-06-30 MED ORDER — POLYETHYLENE GLYCOL 3350 17 GM/SCOOP PO POWD: 17.0000 g | Freq: Every day | ORAL | 3 refills | Status: DC

## 2021-06-30 NOTE — Telephone Encounter (Signed)
Patient requesting that I send in prescription for the Miralax to her pharmacy.  She feels that it works better, I tried to explain that same strength as the OTC, but happy to send RX.

## 2021-07-01 ENCOUNTER — Other Ambulatory Visit: Payer: Self-pay | Admitting: Internal Medicine

## 2021-07-01 DIAGNOSIS — M62830 Muscle spasm of back: Secondary | ICD-10-CM

## 2021-07-09 ENCOUNTER — Telehealth: Payer: Self-pay

## 2021-07-09 NOTE — Telephone Encounter (Signed)
PA started for Cyclobenzaprine HCl '10MG'$  tablets.   Key: [id number]

## 2021-07-10 ENCOUNTER — Other Ambulatory Visit: Payer: Self-pay | Admitting: Internal Medicine

## 2021-07-11 ENCOUNTER — Other Ambulatory Visit: Payer: Self-pay | Admitting: Internal Medicine

## 2021-08-14 ENCOUNTER — Other Ambulatory Visit: Payer: Self-pay | Admitting: Internal Medicine

## 2021-08-23 ENCOUNTER — Other Ambulatory Visit: Payer: Self-pay | Admitting: Pulmonary Disease

## 2021-11-07 ENCOUNTER — Other Ambulatory Visit: Payer: Self-pay | Admitting: Sports Medicine

## 2021-11-09 NOTE — Telephone Encounter (Signed)
Please advise 

## 2021-12-19 ENCOUNTER — Other Ambulatory Visit: Payer: Self-pay | Admitting: Internal Medicine

## 2022-01-14 ENCOUNTER — Ambulatory Visit (INDEPENDENT_AMBULATORY_CARE_PROVIDER_SITE_OTHER): Payer: Medicare Other

## 2022-01-14 ENCOUNTER — Telehealth: Payer: Self-pay | Admitting: Nurse Practitioner

## 2022-01-14 ENCOUNTER — Other Ambulatory Visit: Payer: Self-pay

## 2022-01-14 ENCOUNTER — Ambulatory Visit (INDEPENDENT_AMBULATORY_CARE_PROVIDER_SITE_OTHER): Payer: Medicare Other | Admitting: Nurse Practitioner

## 2022-01-14 ENCOUNTER — Encounter: Payer: Self-pay | Admitting: Nurse Practitioner

## 2022-01-14 VITALS — BP 124/76 | HR 91 | Temp 98.4°F | Ht 63.0 in | Wt 209.0 lb

## 2022-01-14 DIAGNOSIS — J45909 Unspecified asthma, uncomplicated: Secondary | ICD-10-CM

## 2022-01-14 DIAGNOSIS — D860 Sarcoidosis of lung: Secondary | ICD-10-CM | POA: Diagnosis not present

## 2022-01-14 DIAGNOSIS — Z9989 Dependence on other enabling machines and devices: Secondary | ICD-10-CM

## 2022-01-14 DIAGNOSIS — R058 Other specified cough: Secondary | ICD-10-CM

## 2022-01-14 DIAGNOSIS — G4733 Obstructive sleep apnea (adult) (pediatric): Secondary | ICD-10-CM | POA: Diagnosis not present

## 2022-01-14 DIAGNOSIS — J069 Acute upper respiratory infection, unspecified: Secondary | ICD-10-CM

## 2022-01-14 MED ORDER — FLUTICASONE PROPIONATE HFA 44 MCG/ACT IN AERO: 2.0000 | INHALATION_SPRAY | Freq: Two times a day (BID) | RESPIRATORY_TRACT | 3 refills | Status: DC

## 2022-01-14 MED ORDER — HYDROCOD POLI-CHLORPHE POLI ER 10-8 MG/5ML PO SUER: 5.0000 mL | Freq: Two times a day (BID) | ORAL | 0 refills | Status: DC | PRN

## 2022-01-14 MED ORDER — SALINE SPRAY 0.65 % NA SOLN: 1.0000 | NASAL | 2 refills | Status: DC | PRN

## 2022-01-14 MED ORDER — MONTELUKAST SODIUM 10 MG PO TABS: 10.0000 mg | ORAL_TABLET | Freq: Every day | ORAL | 1 refills | Status: DC

## 2022-01-14 NOTE — Assessment & Plan Note (Addendum)
Sinusitis has resolved after z pack; some residual post nasal drip upon exam. Unable to tolerate astelin or flonase in past. Continue Atrovent nasal spray and add saline nasal spray or saline nasal irrigation. Continued Singulair

## 2022-01-14 NOTE — Patient Instructions (Addendum)
Continue Albuterol inhaler 2 puffs or 3 mL neb every 6 hours as needed for shortness of breath or wheezing. Notify if symptoms persist despite rescue inhaler/neb use. -Continue flovent 2 puffs Twice daily. Brush tongue and rinse mouth afterwards -Continue atrovent 2 sprays each nostril Twice daily -Continue singulair 10 mg At bedtime  -Continue famotidine 40 mg Twice daily   -Saline nasal spray 2-3 times a day or saline nasal irrigation (netti pot) 1-2 times a day -Tussionex 5 mL every 12 hours as needed for cough. Do not drive while taking.   Chest x ray today. We will notify you of any abnormal results.  Follow up in 3 months with Dr. Halford Chessman or APP. If symptoms do not improve or worsen, please contact office for sooner follow up or seek emergency care.

## 2022-01-14 NOTE — Progress Notes (Signed)
Attempted to contact pt and husband to notify of CXR results. The upper lobe reticular opacities that she has had on previous imaging have increased when compared to previous, indicating possible worsening of her sarcoid. Infection is possible but given her clinical appearance and symptoms, suspect this is unlikely. Have her monitor her symptoms and if they do not improve or worsen, notify us. Thanks

## 2022-01-14 NOTE — Telephone Encounter (Signed)
I tried to call and was not able to leave a message.

## 2022-01-14 NOTE — Assessment & Plan Note (Addendum)
Asthmatic bronchitis with exacerbation from upper airway irritation vs sarcoid flare. Refused prednisone. Reported tussionex only cough medicine that works - usually only requires a day or two of use and cough subsides. Rx for tussionex - 10mL filled. PDMP reviewed and has not had filled since 2021.  Patient Instructions  Continue Albuterol inhaler 2 puffs or 3 mL neb every 6 hours as needed for shortness of breath or wheezing. Notify if symptoms persist despite rescue inhaler/neb use. -Continue flovent 2 puffs Twice daily. Brush tongue and rinse mouth afterwards -Continue atrovent 2 sprays each nostril Twice daily -Continue singulair 10 mg At bedtime  -Continue famotidine 40 mg Twice daily   -Saline nasal spray 2-3 times a day or saline nasal irrigation (netti pot) 1-2 times a day -Tussionex 5 mL every 12 hours as needed for cough. Do not drive while taking.   Chest x ray today. We will notify you of any abnormal results.  Follow up in 3 months with Dr. Halford Chessman or APP. If symptoms do not improve or worsen, please contact office for sooner follow up or seek emergency care.

## 2022-01-14 NOTE — Assessment & Plan Note (Signed)
Uses occasionally. Does not feel like she needs it. Previous total AHI with very mild OSA;  REM sleep AHI with moderate OSA. We discussed how untreated sleep apnea puts an individual at risk for cardiac arrhthymias, pulm HTN, DM, stroke and increases their risk for daytime accidents; although these are lessened with her mild severity. Continue CPAP 10 cmH2O nightly.

## 2022-01-14 NOTE — Progress Notes (Signed)
@Patient  ID: Cynthia Kirk, female    DOB: 09/08/1951, 71 y.o.   MRN: [id number]  Chief Complaint  Patient presents with   Follow-up    She reports that she has a mostly non productive cough, and doing some better since last dose of Zpack.     Referring provider: [name], MD  HPI: 71 year old female, never smoker followed for asthma, sarcoidosis, and OSA on CPAP. She is a patient of Dr. Juanetta Gosling and last seen in office on 10/07/2020. Past medical history significant for HTN, GERD, DM, obesity, anxiety, HSV.   TEST/EVENTS:  07/01/2011 spirometry: FEV1 1.69 (87), FVC 2.17 (83), FEV1% 78 05/28/2014 PSG: AHI 5.8, SaO2 low 85%. REM AHI 29.3 07/17/2014 CT chest: upper lobe predominant BTX, GGO, nodularity no change since 2011 11/15/2014 PFTs: FEV1 1.76 (87%), FEV1% 77, TLC 3.19 (63), DLCO 74%, no BD.  03/09/2017 echocardiogram: EF 55-60% 12/08/2017 ONO with CPAP: test time 2 hrs and 26 min. Av SpO2 94%, low 81%; 16.1 min <88% 12/20/2017 HRCT: upper lobe fibrosis  12/27/2017 PFTs: FEV1 1.14 (59), FEV1% 78, TLC 74%, DLCO 61 02/21/2018 CPAP titration: CPAP 10 cmH2O, didn't need supplemental O2  10/07/2020: OV with Dr. Halford Chessman. Unable to tolerate CPAP d/t persistent rhinitis. Advised to try astelin nasal spray and resume CPAP therapy 10 cmH2O. No evidence of active pulm sarcoid - continue to monitor. Breathing stable - continued on flovent, singulair and PRN albuterol.   01/14/2022: Today - acute visit Patient presents today with her husband for cough. Her cough started when she had sinusitis 2-3 weeks ago and was treated with a z pack. Her cough improved with z pack but has since returned. She described it as dry and hacky. It is non-productive. Her nasal congestion and drainage has improved. She denies wheezing, shortness of breath, orthopnea, PND, or lower extremity swelling. She denies fevers or hemoptysis. No other systematic symptoms of sarcoid flare. She continues on Flovent Twice daily and rarely  uses her rescue inhaler. She is adamant against prednisone use as she used to be on it chronically >20 years ago. She has had this before and it usually resolves if she can subside her cough with cough medicine after a few days.  Allergies  Allergen Reactions   Aspirin Hives, Shortness Of Breath and Other (See Comments)    Passed out   Ibuprofen Nausea And Vomiting and Other (See Comments)    Upsets stomach   Latex Rash    Immunization History  Administered Date(s) Administered   Fluad Quad(high Dose 65+) 09/20/2019, 12/30/2020   Influenza Split 10/06/2011   Influenza Whole 10/14/2009, 09/14/2010   Influenza, High Dose Seasonal PF 08/19/2016, 08/16/2017   Influenza, Seasonal, Injecte, Preservative Fre 01/29/2013   Influenza,inj,Quad PF,6+ Mos 08/16/2013, 09/09/2014, 01/02/2016   Influenza,inj,quad, With Preservative 08/06/2017   PFIZER Comirnaty(Gray Top)Covid-19 Tri-Sucrose Vaccine 06/23/2021   PFIZER(Purple Top)SARS-COV-2 Vaccination 03/13/2020, 04/03/2020   Pfizer Covid-19 Vaccine Bivalent Booster 23yrs & up 10/13/2021   Pneumococcal Conjugate-13 11/15/2014   Pneumococcal Polysaccharide-23 03/06/2008, 08/19/2016   Td 12/06/2004   Tdap 07/09/2015    Past Medical History:  Diagnosis Date   Abnormal Pap smear 2008   Adenomatous colon polyp    Allergic rhinitis, cause unspecified    Anxiety    ASCUS with positive high risk HPV    Blood transfusion 1976   Due to ectopic pregnancy   Chronic back pain    Ejection fraction    EF 45-50%, echo, October 27, 2011   Endometrial  mass 2011   Esophageal reflux    Fibroid    H/O Clostridium difficile infection 03/2010   H/O menorrhagia 2011   Herpes simplex type 2 infection complicating pregnancy    Hiatal hernia    Iron deficiency anemia    Morbid obesity ([place]) 2010   Obstructive sleep apnea (adult) (pediatric)    cpap   Panic attacks    Plantar fasciitis    Pneumonia 2005; 2007; 2009; 2011   Sarcoidosis 1996   @ [facility]    Simple endometrial hyperplasia 2011   Spinal stenosis, lumbar    s/p decompression laminectomy 09/2013   Syncope    ?? Syncope ??   Type 2 diabetes, diet controlled ([place])    Unspecified asthma(493.90)    Urinary incontinence, mixed 2011   VIN II (vulvar intraepithelial neoplasia II)     Tobacco History: Social History   Tobacco Use  Smoking Status Never  Smokeless Tobacco Never   Counseling given: Not Answered   Outpatient Medications Prior to Visit  Medication Sig Dispense Refill   albuterol (PROVENTIL) (2.5 MG/3ML) 0.083% nebulizer solution TAKE 3ML(2.5MG ) BY NEBULIZATION EVERY 6 HOURS AS NEEDED FOR SHORTNESS OF BREATH 120 mL 11   albuterol (VENTOLIN HFA) 108 (90 Base) MCG/ACT inhaler INHALE 2 PUFF&nbsp;&nbsp;BY MOUTH&nbsp;&nbsp;EVERY 6 HOUR as NEEDED FOR WHEEZING ,SHORTNESS OF BREATH 54 g 3   dexlansoprazole (DEXILANT) 60 MG capsule Take 1 capsule (60 mg total) by mouth daily. 90 capsule 3   estradiol (ESTRACE VAGINAL) 0.1 MG/GM vaginal cream Place 2 g vaginally daily. 42.5 g 2   famotidine (PEPCID) 40 MG tablet Take 1 tablet (40 mg total) by mouth 2 (two) times daily. 180 tablet 3   gabapentin (NEURONTIN) 100 MG capsule TAKE 1 CAPSULE AT BEDTIME 90 capsule 6   hydroquinone 4 % cream Apply 1 application topically 2 (two) times daily. 28.35 g 0   ipratropium (ATROVENT) 0.06 % nasal spray Place 2 sprays into both nostrils 2 (two) times daily. 15 mL 2   polyethylene glycol powder (GLYCOLAX/MIRALAX) 17 GM/SCOOP powder Take 17 g by mouth daily. 578 g 3   triamcinolone ointment (KENALOG) 0.5 % APPLY TO AFFECTED AREA TWICE A DAY 30 g 0   FLOVENT HFA 44 MCG/ACT inhaler INHALE 2 PUFFS INTO THE LUNGS TWICE DAILY 10.6 each 1   montelukast (SINGULAIR) 10 MG tablet TAKE 1 TABLET BY MOUTH EVERYDAY AT BEDTIME 90 tablet 1   VICTOZA 18 MG/3ML SOPN INJECT 1.8 MG UNDER THE SKIN ONCE DAILY 3 mL 1   hydrochlorothiazide (HYDRODIURIL) 12.5 MG tablet Take 12.5 mg by mouth daily.     OZEMPIC, 2  MG/DOSE, 8 MG/3ML SOPN Inject 2 mg into the skin once a week.     ALPRAZolam (XANAX) 0.5 MG tablet TAKE 1 TABLET BY MOUTH EVERY DAY AS NEEDED FOR ANXIETY (Patient not taking: Reported on 01/14/2022) 90 tablet 0   azelastine (ASTELIN) 0.1 % nasal spray Place 1 spray into both nostrils 2 (two) times daily. Use in each nostril as directed (Patient not taking: Reported on 01/14/2022) 30 mL 12   BD PEN NEEDLE NANO 2ND GEN 32G X 4 MM MISC USE AS DIRECTED (Patient not taking: Reported on 01/14/2022) 100 each 2   cyclobenzaprine (FLEXERIL) 10 MG tablet TAKE 1/2 TO 1 TABLET BY MOUTH 3 TIMES DAILY AS NEEDED FOR MUSCLE SPASMS. (Patient not taking: Reported on 01/14/2022) 90 tablet 0   METRONIDAZOLE, TOPICAL, 0.75 % LOTN Apply 1 application topically daily. (Patient not taking: Reported on 01/14/2022) 59  mL 5   nebivolol (BYSTOLIC) 5 MG tablet TAKE 1 TABLET BY MOUTH EVERY DAY (Patient not taking: Reported on 01/14/2022) 90 tablet 1   Olmesartan-amLODIPine-HCTZ 40-5-25 MG TABS Take 1 tablet by mouth daily. (Patient not taking: Reported on 01/14/2022)     olmesartan-hydrochlorothiazide ([name] HCT) 40-25 MG tablet TAKE 1 TABLET DAILY (Patient not taking: Reported on 01/14/2022) 90 tablet 1   No facility-administered medications prior to visit.     Review of Systems:   Constitutional: No weight loss or gain, night sweats, fevers, chills, fatigue, or lassitude. HEENT: No headaches, difficulty swallowing, tooth/dental problems, or sore throat. No sneezing, itching, ear ache, nasal congestion. +post nasal drip, improving CV:  No chest pain, orthopnea, PND, swelling in lower extremities, anasarca, dizziness, palpitations, syncope Resp: Dry, hacking cough. No shortness of breath with exertion or at rest. No excess mucus or change in color of mucus. No hemoptysis. No wheezing.  No chest wall deformity GI:  No heartburn, indigestion, abdominal pain, nausea, vomiting, diarrhea, change in bowel habits, loss of appetite, bloody stools.   GU: No dysuria, change in color of urine, urgency or frequency.  No flank pain, no hematuria  Skin: No rash, lesions, ulcerations MSK:  No joint pain or swelling.  No decreased range of motion.  No back pain. Neuro: No dizziness or lightheadedness.  Psych: No depression or anxiety. Mood stable.     Physical Exam:  BP 124/76 (BP Location: Left Arm, Patient Position: Sitting, Cuff Size: Normal)    Pulse 91    Temp 98.4 F (36.9 C) (Oral)    Ht 5\' 3"  (1.6 m)    Wt 209 lb (94.8 kg)    LMP 02/14/2014    SpO2 98%    BMI 37.02 kg/m   GEN: Pleasant, interactive, well-appearing; obese; in no acute distress HEENT:  Normocephalic and atraumatic. EACs patent bilaterally. TM pearly gray with present light reflex bilaterally. PERRLA. Sclera white. Nasal turbinates pink, moist and patent bilaterally. No rhinorrhea present. Oropharynx erythematous and moist, without exudate or edema. No lesions, ulcerations, or postnasal drip.  NECK:  Supple w/ fair ROM. No JVD present. Normal carotid impulses w/o bruits. Thyroid symmetrical with no goiter or nodules palpated. No lymphadenopathy.   CV: RRR, no m/r/g, no peripheral edema. Pulses intact, +2 bilaterally. No cyanosis, pallor or clubbing. PULMONARY:  Unlabored, regular breathing. Clear bilaterally A&P w/o wheezes/rales/rhonchi. No accessory muscle use. No dullness to percussion. GI: BS present and normoactive. Soft, non-tender to palpation. No organomegaly or masses detected. No CVA tenderness. MSK: No erythema, warmth or tenderness. Cap refil <2 sec all extrem. No deformities or joint swelling noted.  Neuro: A/Ox3. No focal deficits noted.   Skin: Warm, no lesions or rashe Psych: Normal affect and behavior. Judgement and thought content appropriate.     Lab Results:  CBC    Component Value Date/Time   WBC 4.5 02/20/2021 1147   RBC 3.77 (L) 02/20/2021 1147   HGB 11.0 (L) 02/20/2021 1147   HCT 33.7 (L) 02/20/2021 1147   PLT 228.0 02/20/2021 1147    MCV 89.6 02/20/2021 1147   MCH 29.5 12/27/2017 1419   MCHC 32.6 02/20/2021 1147   RDW 14.9 02/20/2021 1147   LYMPHSABS 1.0 02/20/2021 1147   MONOABS 0.4 02/20/2021 1147   EOSABS 0.1 02/20/2021 1147   BASOSABS 0.0 02/20/2021 1147    BMET    Component Value Date/Time   NA 143 02/20/2021 1147   K 4.1 02/20/2021 1147   CL 107 02/20/2021  1147   CO2 30 02/20/2021 1147   GLUCOSE 88 02/20/2021 1147   BUN 23 02/20/2021 1147   CREATININE 0.90 02/20/2021 1147   CALCIUM 9.0 02/20/2021 1147   GFRNONAA >60 12/27/2017 1419   GFRAA >60 12/27/2017 1419    BNP No results found for: BNP   Imaging:  01/14/2022: CXR reviewed by me.   DG Chest 2 View  Result Date: 01/14/2022 CLINICAL DATA:  Persistent cough EXAM: CHEST - 2 VIEW COMPARISON:  Chest x-ray dated September 19, 2018 FINDINGS: Cardiac and mediastinal contours are within normal limits. Upper lobe predominant reticular opacities are increased when compared with prior exam. No large pleural effusion IMPRESSION: Upper lobe predominant reticular opacities which appear increased when compared with prior exam for worsening pulmonary sarcoid, superimposed infection is an additional consideration. CT could be performed for further evaluation. Electronically Signed   By: Yetta Glassman M.D.   On: 01/14/2022 15:34      PFT Results Latest Ref Rng & Units 12/27/2017 11/15/2014  FVC-Pre L 1.46 2.21  FVC-Predicted Pre % 59 86  FVC-Post L - 2.27  FVC-Predicted Post % - 88  Pre FEV1/FVC % % 78 77  Post FEV1/FCV % % - 77  FEV1-Pre L 1.14 1.70  FEV1-Predicted Pre % 59 84  FEV1-Post L - 1.76  DLCO uncorrected ml/min/mmHg 14.92 18.01  DLCO UNC% % 61 74  DLCO corrected ml/min/mmHg 14.66 -  DLCO COR %Predicted % 60 -  DLVA Predicted % 84 93  TLC L 3.75 3.19  TLC % Predicted % 74 63  RV % Predicted % 78 41    No results found for: NITRICOXIDE      Assessment & Plan:   Asthma with bronchitis Asthmatic bronchitis with exacerbation from  upper airway irritation vs sarcoid flare. Refused prednisone. Reported tussionex only cough medicine that works - usually only requires a day or two of use and cough subsides. Rx for tussionex - 80mL filled. PDMP reviewed and has not had filled since 2021.  Patient Instructions  Continue Albuterol inhaler 2 puffs or 3 mL neb every 6 hours as needed for shortness of breath or wheezing. Notify if symptoms persist despite rescue inhaler/neb use. -Continue flovent 2 puffs Twice daily. Brush tongue and rinse mouth afterwards -Continue atrovent 2 sprays each nostril Twice daily -Continue singulair 10 mg At bedtime  -Continue famotidine 40 mg Twice daily   -Saline nasal spray 2-3 times a day or saline nasal irrigation (netti pot) 1-2 times a day -Tussionex 5 mL every 12 hours as needed for cough. Do not drive while taking.   Chest x ray today. We will notify you of any abnormal results.  Follow up in 3 months with Dr. Halford Chessman or APP. If symptoms do not improve or worsen, please contact office for sooner follow up or seek emergency care.    URI (upper respiratory infection) Sinusitis has resolved after z pack; some residual post nasal drip upon exam. Unable to tolerate astelin or flonase in past. Continue Atrovent nasal spray and add saline nasal spray or saline nasal irrigation. Continued Singulair  Pulmonary sarcoidosis (HCC) Possible worsening sarcoid on CXR with increase reticular opacities; unlikely superimposed infection as this is not clinically correlated. Advised to notify if symptoms do not improve within a few days. Encouraged short burst of prednisone but pt adamantly declined. Did agree that if symptoms do not improve, she may consider trying. If problems persist, will obtain ACE level and further imaging.  OSA on CPAP Uses  occasionally. Does not feel like she needs it. Previous total AHI with very mild OSA;  REM sleep AHI with moderate OSA. We discussed how untreated sleep apnea puts an  individual at risk for cardiac arrhthymias, pulm HTN, DM, stroke and increases their risk for daytime accidents; although these are lessened with her mild severity. Continue CPAP 10 cmH2O nightly.   Cynthia Bibles, NP 01/14/2022  Pt aware and understands NP's role.

## 2022-01-14 NOTE — Assessment & Plan Note (Addendum)
Possible worsening sarcoid on CXR with increase reticular opacities; unlikely superimposed infection as this is not clinically correlated. Advised to notify if symptoms do not improve within a few days. Encouraged short burst of prednisone but pt adamantly declined. Did agree that if symptoms do not improve, she may consider trying. If problems persist, will obtain ACE level and further imaging.

## 2022-01-15 NOTE — Progress Notes (Signed)
Reviewed and agree with assessment/plan.   Chesley Mires, MD [facility] Pulmonary/Critical Care 01/15/2022, 8:28 AM Pager:  [phone]

## 2022-01-26 MED ORDER — HYDROCOD POLI-CHLORPHE POLI ER 10-8 MG/5ML PO SUER: 5.0000 mL | Freq: Two times a day (BID) | ORAL | 0 refills | Status: DC | PRN

## 2022-01-26 NOTE — Telephone Encounter (Signed)
Called patient but she did not answer. Left message for her to call back.  

## 2022-01-26 NOTE — Telephone Encounter (Signed)
Refill for tussionex sent.  I have also ordered high resolution CT chest and PFT  to further assess status of her sarcoidosis.    Please also ask her if she would like a referral to pulmonary department at [facility] to manage her sarcoidosis in the future.

## 2022-02-01 ENCOUNTER — Other Ambulatory Visit: Payer: Self-pay | Admitting: Gastroenterology

## 2022-02-08 ENCOUNTER — Other Ambulatory Visit: Payer: Medicare Other

## 2022-02-09 ENCOUNTER — Encounter (HOSPITAL_COMMUNITY): Payer: Self-pay

## 2022-02-09 ENCOUNTER — Other Ambulatory Visit: Payer: Self-pay

## 2022-02-09 ENCOUNTER — Ambulatory Visit (INDEPENDENT_AMBULATORY_CARE_PROVIDER_SITE_OTHER): Payer: Medicare Other | Admitting: Pulmonary Disease

## 2022-02-09 ENCOUNTER — Encounter: Payer: Self-pay | Admitting: Pulmonary Disease

## 2022-02-09 ENCOUNTER — Ambulatory Visit (HOSPITAL_COMMUNITY)
Admission: RE | Admit: 2022-02-09 | Discharge: 2022-02-09 | Disposition: A | Discharge status: Home / Self Care | Payer: Medicare Other | Source: Ambulatory Visit | Attending: Pulmonary Disease | Admitting: Pulmonary Disease

## 2022-02-09 VITALS — BP 120/80 | HR 88 | Temp 97.8°F | Ht 64.0 in | Wt 211.6 lb

## 2022-02-09 DIAGNOSIS — J45909 Unspecified asthma, uncomplicated: Secondary | ICD-10-CM

## 2022-02-09 DIAGNOSIS — D860 Sarcoidosis of lung: Secondary | ICD-10-CM

## 2022-02-09 DIAGNOSIS — J45998 Other asthma: Secondary | ICD-10-CM

## 2022-02-09 DIAGNOSIS — R058 Other specified cough: Secondary | ICD-10-CM | POA: Diagnosis not present

## 2022-02-09 DIAGNOSIS — G4733 Obstructive sleep apnea (adult) (pediatric): Secondary | ICD-10-CM | POA: Diagnosis not present

## 2022-02-09 DIAGNOSIS — Z8616 Personal history of COVID-19: Secondary | ICD-10-CM

## 2022-02-09 DIAGNOSIS — Z9989 Dependence on other enabling machines and devices: Secondary | ICD-10-CM | POA: Diagnosis not present

## 2022-02-09 MED ORDER — CHLORPHENIRAMINE MALEATE 4 MG PO TABS: 4.0000 mg | ORAL_TABLET | Freq: Every day | ORAL | 3 refills | Status: DC

## 2022-02-09 MED ORDER — BUDESONIDE-FORMOTEROL FUMARATE 80-4.5 MCG/ACT IN AERO: 2.0000 | INHALATION_SPRAY | Freq: Two times a day (BID) | RESPIRATORY_TRACT | 12 refills | Status: DC

## 2022-02-09 NOTE — Progress Notes (Signed)
[facility]  Chief Complaint  Patient presents with   Follow-up    Follow up. Patient says everything is going good but still coughing.     Constitutional:  BP 120/80 (BP Location: Left Arm, Patient Position: Sitting, Cuff Size: Large)    Pulse 88    Temp 97.8 F (36.6 C) (Oral)    Ht '5\' 4"'$  (1.626 m)    Wt 211 lb 9.6 oz (96 kg)    LMP 02/14/2014    SpO2 100%    BMI 36.32 kg/m   Past Medical History:  DM, Spinal stenosis, PNA, Anemia, HH, HSV, C diff, GERD, Anxiety, Colon polyp, COVID 24 October 2021  Past Surgical History:  Her  has a past surgical history that includes Vulvectomy (~ 2007); Ectopic pregnancy surgery (1976; ~ 1978); Urethral sling (2011); Dilation and curettage of uterus (2007); Posterior laminectomy / decompression lumbar spine (09/05/2013); Thoracic laminectomy (09/05/2013); Posterior lumbar fusion (09/05/2013); Hysteroscopy diagnostic (2011); Colposcopy vulva w/ biopsy; Maximum access (mas)posterior lumbar interbody fusion (plif) 1 level (N/A, 09/05/2013); and Lumbar laminectomy/decompression microdiscectomy (N/A, 09/05/2013).  Brief Summary:  Cynthia Kirk is a 71 y.o. female with pulmonary sarcoidosis, asthma, and obstructive sleep apnea.      Subjective:   I last saw her in 2021 just before she moved to [place].  More recently seen by Lucretia Kern.  She had COVID in November 2022.  Since then she has persistent cough.  This got worse when she was staying in [place] with family.  She thinks she had mold exposure there.  She is in the process of moving back to [place] in April.  Since she has come back to the [place] her cough is some better.   Now she gets throat irritation and dry cough.  She was bring up sputum and had sinus congestion before, but these improved after taking a course of antibiotics.  She is not having too much sinus drainage at present.  She had CT chest this morning.  Showed essentially  stable changes of sarcoidosis.  Physical Exam:   Appearance - well kempt   ENMT - no sinus tenderness, no oral exudate, no LAN, Mallampati 3 airway, no stridor  Respiratory - equal breath sounds bilaterally, no wheezing or rales  CV - s1s2 regular rate and rhythm, no murmurs  Ext - no clubbing, no edema  Skin - no rashes  Psych - normal mood and affect   Pulmonary testing:  Spirometry 06/30/09 >> FEV1 1.69(87%), FVC 2.17(83%), FEV1% 78 PFT 11/15/14 >> FEV1 1.76 (87%), FEV1% 77, TLC 3.19 (63%), DLCO 74%, no BD PFT 12/27/17 >> FEV1 1.14 (59%), FEV1% 78, TLC 3.75 (74%), DLCO 61  Chest Imaging:  CT chest 07/17/14 >> upper lobe predominant BTX, GGO, nodularity no change since 2011 HRCT chest 12/20/17 >> upper lobe fibrosis HRCT chest 02/09/22 >> atherosclerosis, symmetric patchy peribronchovascular and subpleural reticulation and GGO throughout both lungs with associated mild-to-moderate traction bronchiectasis, architectural distortion and volume loss, upper lobe predominant.  The degree of volume loss, bronchiectasis and distortion has mildly progressed in the interval. The findings are otherwise not substantially changed  Sleep Tests:  PSG 05/28/14 >> AHI 5.8, SaO2 low 85%. REM AHI 29.3 ONO with CPAP 12/08/17 >> test time 2 hrs 26 min.  Average SpO2 94%, low SpO2 81%.  Spent 16.1 min with SpO2 < 88%. CPAP titration 02/21/18 >> CPAP 10 cm H2O; didn't need supplemental oxygen. CPAP 07/15/19 to 08/13/19 >> used on  30 of 30 nights with average 8 hrs 47 min.  Average AHI 1.1 with CPAP 10 cm H2O  Cardiac Tests:  Echo 03/09/17 >> EF 55 to 60%  Social History:  She  reports that she has never smoked. She has never used smokeless tobacco. She reports that she does not currently use alcohol. She reports that she does not use drugs.  Family History:  Her family history includes Asthma in her daughter and son; Cancer in her paternal uncle; Diabetes in her father and another family member; Heart  attack in her father and sister; High Cholesterol in her brother; Hypertension in her father; Kidney disease in her sister and another family member; Lung cancer in her maternal uncle; Migraines in her father; Osteoporosis in her sister and sister; Seizures in her father.     Assessment/Plan:   Persistent asthma. - will have her try symbicort in place of flovent - prn albuterol - continue singulair  Upper airway cough syndrome. - made worse after recent COVID infection - don't think she needs additional antibiotics at this time and she would like to avoid prednisone therapy  - intolerant of steroid nose sprays - will have her use chlorpheniramine 4 mg nightly - salt water gargle bid - sip water with urge to cough and sugarless candy to keep her mouth moist - 1 teaspoon of honey bid   Obstructive sleep apnea. - she has trouble with rhinitis from CPAP - will have her try using azelastine nasal spray bid and resume CPAP therapy  - she uses [employer] for her DME - continue CPAP 10 cm H2O  Vasomotor rhinitis. - previously seen by Dr. Melida Quitter with ENT - continue atrovent nasal spray - she was intolerant of previous trials of nasal steroid sprays >> caused headache - explained how this is different than issues with CPAP rhinitis  Pulmonary sarcoidosis. - reviewed her CT chest and findings stable - don't think she needs additional therapy for sarcoidosis at this time  Time Spent Involved in Patient Care on Day of Examination:  39 minutes  Follow up:   Patient Instructions  Symbicort two puffs in the morning and two puffs in the evening, and rinse your mouth after each use  Chlorpheniramine 4 mg tablet nightly  Salt water gargle twice per day  Sip water when you have the urge to cough  Use sugarless candy to help keep your mouth moist  1 teaspoon of honey twice per day  Avoid forcing a cough or clearing your throat, and allow your voice to rest when able  Follow up in  2 months  Medication List:   Allergies as of 02/09/2022       Reactions   Aspirin Hives, Shortness Of Breath, Other (See Comments)   Passed out   Ibuprofen Nausea And Vomiting, Other (See Comments)   Upsets stomach   Latex Rash        Medication List        Accurate as of February 09, 2022  3:18 PM. If you have any questions, ask your nurse or doctor.          STOP taking these medications    chlorpheniramine-HYDROcodone 10-8 MG/5ML Commonly known as: Tussionex Pennkinetic ER Stopped by: Chesley Mires, MD   dexlansoprazole 60 MG capsule Commonly known as: Dexilant Stopped by: Chesley Mires, MD   estradiol 0.1 MG/GM vaginal cream Commonly known as: ESTRACE VAGINAL Stopped by: Chesley Mires, MD   fluticasone 44 MCG/ACT inhaler Commonly known as:  Flovent HFA Stopped by: Chesley Mires, MD   gabapentin 100 MG capsule Commonly known as: NEURONTIN Stopped by: Chesley Mires, MD   polyethylene glycol powder 17 GM/SCOOP powder Commonly known as: GLYCOLAX/MIRALAX Stopped by: Chesley Mires, MD       TAKE these medications    albuterol 108 (90 Base) MCG/ACT inhaler Commonly known as: Ventolin HFA INHALE 2 PUFF&nbsp;&nbsp;BY MOUTH&nbsp;&nbsp;EVERY 6 HOUR as NEEDED FOR WHEEZING ,SHORTNESS OF BREATH   albuterol (2.5 MG/3ML) 0.083% nebulizer solution Commonly known as: PROVENTIL TAKE 3ML(2.'5MG'$ ) BY NEBULIZATION EVERY 6 HOURS AS NEEDED FOR SHORTNESS OF BREATH   budesonide-formoterol 80-4.5 MCG/ACT inhaler Commonly known as: Symbicort Inhale 2 puffs into the lungs 2 (two) times daily. Started by: Chesley Mires, MD   chlorpheniramine 4 MG tablet Commonly known as: CHLOR-TRIMETON Take 1 tablet (4 mg total) by mouth at bedtime. Started by: Chesley Mires, MD   famotidine 40 MG tablet Commonly known as: PEPCID Take 1 tablet (40 mg total) by mouth 2 (two) times daily.   hydrochlorothiazide 12.5 MG tablet Commonly known as: HYDRODIURIL Take 12.5 mg by mouth daily.   hydroquinone 4 %  cream Apply 1 application topically 2 (two) times daily.   ipratropium 0.06 % nasal spray Commonly known as: ATROVENT Place 2 sprays into both nostrils 2 (two) times daily.   montelukast 10 MG tablet Commonly known as: SINGULAIR Take 1 tablet (10 mg total) by mouth at bedtime.   Ozempic (2 MG/DOSE) 8 MG/3ML Sopn Generic drug: Semaglutide (2 MG/DOSE) Inject 2 mg into the skin once a week.   sodium chloride 0.65 % Soln nasal spray Commonly known as: OCEAN Place 1 spray into both nostrils as needed for congestion.   triamcinolone ointment 0.5 % Commonly known as: KENALOG APPLY TO AFFECTED AREA TWICE A DAY        Signature:  Chesley Mires, MD [place] Pager - [phone] 02/09/2022, 3:18 PM

## 2022-02-09 NOTE — Patient Instructions (Addendum)
Symbicort two puffs in the morning and two puffs in the evening, and rinse your mouth after each use ? ?Chlorpheniramine 4 mg tablet nightly ? ?Salt water gargle twice per day ? ?Sip water when you have the urge to cough ? ?Use sugarless candy to help keep your mouth moist ? ?1 teaspoon of honey twice per day ? ?Avoid forcing a cough or clearing your throat, and allow your voice to rest when able ? ?Follow up in 2 months ?

## 2022-03-01 ENCOUNTER — Telehealth: Payer: Self-pay

## 2022-03-01 NOTE — Telephone Encounter (Signed)
-----   Message from Ladene Artist, MD sent at 03/01/2022 12:26 PM EDT ----- ?Please cancel any future procedure recalls or appts. Chart review shows she relocated to [place], [place] and is following with [facility] in [place].  ? ?

## 2022-03-28 ENCOUNTER — Other Ambulatory Visit: Payer: Self-pay | Admitting: Internal Medicine

## 2022-04-17 ENCOUNTER — Other Ambulatory Visit: Payer: Self-pay | Admitting: Gastroenterology

## 2022-07-07 ENCOUNTER — Other Ambulatory Visit: Payer: Self-pay | Admitting: Nurse Practitioner

## 2022-07-07 DIAGNOSIS — J45909 Unspecified asthma, uncomplicated: Secondary | ICD-10-CM

## 2022-09-15 ENCOUNTER — Other Ambulatory Visit (HOSPITAL_BASED_OUTPATIENT_CLINIC_OR_DEPARTMENT_OTHER): Payer: Self-pay

## 2022-09-15 MED ORDER — FLUAD QUADRIVALENT 0.5 ML IM PRSY
PREFILLED_SYRINGE | INTRAMUSCULAR | 0 refills | Status: DC
  Filled 2022-09-15: qty 0.5, 1d supply, fill #0

## 2022-09-15 MED ORDER — COVID-19 MRNA 2023-2024 VACCINE (COMIRNATY) 0.3 ML INJECTION
INTRAMUSCULAR | 0 refills | Status: DC
  Filled 2022-09-15: qty 0.3, 1d supply, fill #0

## 2022-09-30 ENCOUNTER — Telehealth: Payer: Self-pay | Admitting: Internal Medicine

## 2022-09-30 DIAGNOSIS — I10 Essential (primary) hypertension: Secondary | ICD-10-CM

## 2022-09-30 DIAGNOSIS — E1169 Type 2 diabetes mellitus with other specified complication: Secondary | ICD-10-CM

## 2022-09-30 DIAGNOSIS — R7989 Other specified abnormal findings of blood chemistry: Secondary | ICD-10-CM

## 2022-09-30 DIAGNOSIS — E538 Deficiency of other specified B group vitamins: Secondary | ICD-10-CM

## 2022-09-30 NOTE — Telephone Encounter (Signed)
Pt called to report she needs lab orders so she can get lab work done on 10/05/22  while she is here with her husband. Pt states her husband has an appointment with Dr. Quay Burow on 10/05/22 at 10:40 am. Pt says her husband is her only transportation. Pt also says provider "always sees them together at the same time."  Informed pt Dr. Quay Burow does not have an open scheduled time available close to her husbands appt time. Pt reports recently moving back to [place]. Please call pt [phone].  Last seen on 02/25/21.

## 2022-10-04 NOTE — Telephone Encounter (Signed)
ordered

## 2022-10-10 NOTE — Progress Notes (Signed)
Virtual Visit via Video Note  I connected with Cynthia Kirk on 10/10/22 at  1:40 PM EST by a video enabled telemedicine application and verified that I am speaking with the correct person using two identifiers.   I discussed the limitations of evaluation and management by telemedicine and the availability of in person appointments. The patient expressed understanding and agreed to proceed.  Present for the visit:  Myself, Dr [name], Lutricia Horsfall.  The patient is currently at home and I am in the office.    No referring provider.    History of Present Illness: She is here for follow up of her chronic medical conditions, dm, hld, gerd, anxiety, lumbar spinal stenosis, bl knee OA, obesity, asthma  She has lost weight on ozempic.  She is down to 191 - wants to get to 170.  She is eating well - eats much less.  Eats 2-3 times a day.  Eats a lot of fruit.  She exercising regularly.  No longer on htn meds.  Still retains fluid at times - takes hctz 12.5 mg daily as needed only.  Overall doing well refills and wanted to reestablish after moving back in the area.  Hypertension resolved after weight loss   Review of Systems  Constitutional:  Negative for fever.  Respiratory:  Negative for cough, shortness of breath and wheezing.   Cardiovascular:  Positive for leg swelling. Negative for chest pain and palpitations.  Neurological:  Positive for headaches (occ). Negative for dizziness.      Social History   Socioeconomic History   Marital status: Married    Spouse name: Not on file   Number of children: 2   Years of education: PHD   Highest education level: Not on file  Occupational History   Occupation: retired    Associate Professor: UNEMPLOYED  Tobacco Use   Smoking status: Never   Smokeless tobacco: Never  Vaping Use   Vaping Use: Never used  Substance and Sexual Activity   Alcohol use: Not Currently   Drug use: No   Sexual activity: Yes    Birth control/protection:  Post-menopausal  Other Topics Concern   Not on file  Social History Narrative   Pt lives at home with her spouse.   She does not use caffeine.   Social Determinants of Health   Financial Resource Strain: Low Risk  (05/29/2020)   Overall Financial Resource Strain (CARDIA)    Difficulty of Paying Living Expenses: Not hard at all  Food Insecurity: No Food Insecurity (09/19/2018)   Hunger Vital Sign    Worried About Running Out of Food in the Last Year: Never true    Ran Out of Food in the Last Year: Never true  Transportation Needs: No Transportation Needs (09/19/2018)   PRAPARE - Administrator, Civil Service (Medical): No    Lack of Transportation (Non-Medical): No  Physical Activity: Sufficiently Active (09/19/2018)   Exercise Vital Sign    Days of Exercise per Week: 4 days    Minutes of Exercise per Session: 50 min  Stress: No Stress Concern Present (09/19/2018)   [employer] of Occupational Health - Occupational Stress Questionnaire    Feeling of Stress : Not at all  Social Connections: Socially Integrated (09/19/2018)   Social Connection and Isolation Panel [NHANES]    Frequency of Communication with Friends and Family: More than three times a week    Frequency of Social Gatherings with Friends and Family: More than three times a week  Attends Religious Services: More than 4 times per year    Active Member of Clubs or Organizations: Yes    Attends Banker Meetings: More than 4 times per year    Marital Status: Married     Observations/Objective: Appears well in NAD Breathing normally  Assessment and Plan:  See Problem List for Assessment and Plan of chronic medical problems.  We will come to the office for blood work.  Plan follow-up with me in 6 months, sooner if needed  Follow Up Instructions:    I discussed the assessment and treatment plan with the patient. The patient was provided an opportunity to ask questions and all were  answered. The patient agreed with the plan and demonstrated an understanding of the instructions.   The patient was advised to call back or seek an in-person evaluation if the symptoms worsen or if the condition fails to improve as anticipated.    Pincus Sanes, MD

## 2022-10-11 ENCOUNTER — Other Ambulatory Visit: Payer: Self-pay | Admitting: Internal Medicine

## 2022-10-11 ENCOUNTER — Encounter: Payer: Self-pay | Admitting: Internal Medicine

## 2022-10-11 ENCOUNTER — Telehealth (INDEPENDENT_AMBULATORY_CARE_PROVIDER_SITE_OTHER): Payer: Medicare Other | Admitting: Internal Medicine

## 2022-10-11 DIAGNOSIS — E538 Deficiency of other specified B group vitamins: Secondary | ICD-10-CM

## 2022-10-11 DIAGNOSIS — K219 Gastro-esophageal reflux disease without esophagitis: Secondary | ICD-10-CM | POA: Diagnosis not present

## 2022-10-11 DIAGNOSIS — I10 Essential (primary) hypertension: Secondary | ICD-10-CM

## 2022-10-11 DIAGNOSIS — E1169 Type 2 diabetes mellitus with other specified complication: Secondary | ICD-10-CM

## 2022-10-11 DIAGNOSIS — F419 Anxiety disorder, unspecified: Secondary | ICD-10-CM

## 2022-10-11 DIAGNOSIS — J45909 Unspecified asthma, uncomplicated: Secondary | ICD-10-CM | POA: Insufficient documentation

## 2022-10-11 DIAGNOSIS — J4540 Moderate persistent asthma, uncomplicated: Secondary | ICD-10-CM | POA: Diagnosis not present

## 2022-10-11 MED ORDER — HYDROCHLOROTHIAZIDE 12.5 MG PO TABS: 12.5000 mg | ORAL_TABLET | Freq: Every day | ORAL | 1 refills | Status: DC

## 2022-10-11 MED ORDER — OZEMPIC (2 MG/DOSE) 8 MG/3ML ~~LOC~~ SOPN: 2.0000 mg | PEN_INJECTOR | SUBCUTANEOUS | 1 refills | Status: DC

## 2022-10-11 MED ORDER — PANTOPRAZOLE SODIUM 40 MG PO TBEC: 40.0000 mg | DELAYED_RELEASE_TABLET | Freq: Every day | ORAL | 2 refills | Status: DC

## 2022-10-11 MED ORDER — TRIAMCINOLONE ACETONIDE 0.5 % EX OINT: TOPICAL_OINTMENT | CUTANEOUS | 3 refills | Status: DC

## 2022-10-11 MED ORDER — FAMOTIDINE 40 MG PO TABS: 40.0000 mg | ORAL_TABLET | Freq: Two times a day (BID) | ORAL | 1 refills | Status: DC | PRN

## 2022-10-11 NOTE — Assessment & Plan Note (Signed)
Chronic Currently controlled Continue Symbicort 2 puffs twice daily and albuterol as needed

## 2022-10-11 NOTE — Assessment & Plan Note (Signed)
Chronic GERD controlled Continue pantoprazole 40 mg daily in the morning, Pepcid 40 mg daily in the evening

## 2022-10-11 NOTE — Assessment & Plan Note (Signed)
Chronic ?Check B12 level ?

## 2022-10-11 NOTE — Assessment & Plan Note (Signed)
Chronic We will, get blood work done which will include A1c Sugars likely very controlled, especially given weight loss Continue diabetic diet, regular exercise We will continue Ozempic 2 mg weekly-prescription sent to pharmacy

## 2022-10-14 ENCOUNTER — Ambulatory Visit (HOSPITAL_BASED_OUTPATIENT_CLINIC_OR_DEPARTMENT_OTHER): Payer: Medicare Other | Admitting: Pulmonary Disease

## 2022-11-22 ENCOUNTER — Other Ambulatory Visit (INDEPENDENT_AMBULATORY_CARE_PROVIDER_SITE_OTHER): Payer: Medicare Other

## 2022-11-22 DIAGNOSIS — E538 Deficiency of other specified B group vitamins: Secondary | ICD-10-CM | POA: Diagnosis not present

## 2022-11-22 DIAGNOSIS — I10 Essential (primary) hypertension: Secondary | ICD-10-CM | POA: Diagnosis not present

## 2022-11-22 DIAGNOSIS — E1169 Type 2 diabetes mellitus with other specified complication: Secondary | ICD-10-CM | POA: Diagnosis not present

## 2022-11-22 DIAGNOSIS — R7989 Other specified abnormal findings of blood chemistry: Secondary | ICD-10-CM

## 2022-11-23 LAB — COMPREHENSIVE METABOLIC PANEL
ALT: 11 U/L (ref 0–35)
AST: 21 U/L (ref 0–37)
Albumin: 4.2 g/dL (ref 3.5–5.2)
Alkaline Phosphatase: 126 U/L — ABNORMAL HIGH (ref 39–117)
BUN: 12 mg/dL (ref 6–23)
CO2: 29 mEq/L (ref 19–32)
Calcium: 9.4 mg/dL (ref 8.4–10.5)
Chloride: 101 mEq/L (ref 96–112)
Creatinine, Ser: 0.78 mg/dL (ref 0.40–1.20)
GFR: 76.23 mL/min (ref >60.00)
Glucose, Bld: 92 mg/dL (ref 70–99)
Potassium: 3.6 mEq/L (ref 3.5–5.1)
Sodium: 137 mEq/L (ref 135–145)
Total Bilirubin: 0.5 mg/dL (ref 0.2–1.2)
Total Protein: 7 g/dL (ref 6.0–8.3)

## 2022-11-23 LAB — CBC WITH DIFFERENTIAL/PLATELET
Basophils Absolute: 0.1 10*3/uL (ref 0.0–0.1)
Basophils Relative: 1.1 % (ref 0.0–3.0)
Eosinophils Absolute: 0.1 10*3/uL (ref 0.0–0.7)
Eosinophils Relative: 1.3 % (ref 0.0–5.0)
HCT: 36.8 % (ref 36.0–46.0)
Hemoglobin: 12.5 g/dL (ref 12.0–15.0)
Lymphocytes Relative: 28.8 % (ref 12.0–46.0)
Lymphs Abs: 1.4 10*3/uL (ref 0.7–4.0)
MCHC: 33.9 g/dL (ref 30.0–36.0)
MCV: 88.5 fl (ref 78.0–100.0)
Monocytes Absolute: 0.4 10*3/uL (ref 0.1–1.0)
Monocytes Relative: 8.4 % (ref 3.0–12.0)
Neutro Abs: 2.9 10*3/uL (ref 1.4–7.7)
Neutrophils Relative %: 60.4 % (ref 43.0–77.0)
Platelets: 235 10*3/uL (ref 150.0–400.0)
RBC: 4.16 Mil/uL (ref 3.87–5.11)
RDW: 14.5 % (ref 11.5–15.5)
WBC: 4.8 10*3/uL (ref 4.0–10.5)

## 2022-11-23 LAB — LIPID PANEL
Cholesterol: 193 mg/dL (ref 0–200)
HDL: 72.9 mg/dL (ref >39.00)
LDL Cholesterol: 110 mg/dL — ABNORMAL HIGH (ref 0–99)
NonHDL: 120.27
Total CHOL/HDL Ratio: 3
Triglycerides: 49 mg/dL (ref 0.0–149.0)
VLDL: 9.8 mg/dL (ref 0.0–40.0)

## 2022-11-23 LAB — MICROALBUMIN / CREATININE URINE RATIO
Creatinine,U: 220.6 mg/dL
Microalb Creat Ratio: 0.7 mg/g (ref 0.0–30.0)
Microalb, Ur: 1.4 mg/dL (ref 0.0–1.9)

## 2022-11-23 LAB — HEMOGLOBIN A1C: Hgb A1c MFr Bld: 5 % (ref 4.6–6.5)

## 2022-11-23 LAB — VITAMIN B12: Vitamin B-12: 360 pg/mL (ref 211–911)

## 2022-12-02 ENCOUNTER — Encounter (HOSPITAL_BASED_OUTPATIENT_CLINIC_OR_DEPARTMENT_OTHER): Payer: Self-pay

## 2022-12-02 ENCOUNTER — Ambulatory Visit (HOSPITAL_BASED_OUTPATIENT_CLINIC_OR_DEPARTMENT_OTHER): Payer: Medicare Other | Admitting: Pulmonary Disease

## 2023-01-04 NOTE — Progress Notes (Deleted)
Virtual Visit via Video Note  I connected with Cynthia Kirk on 01/04/23 at  3:40 PM EST by a video enabled telemedicine application and verified that I am speaking with the correct person using two identifiers.   I discussed the limitations of evaluation and management by telemedicine and the availability of in person appointments. The patient expressed understanding and agreed to proceed.  Present for the visit:  Myself, Dr Billey Gosling, [name].  The patient is currently at home and I am in the office.    No referring provider.    History of Present Illness: She is here for an acute visit for cold symptoms.   Her symptoms started   She is experiencing   She has tried taking     Social History   Socioeconomic History   Marital status: Married    Spouse name: Not on file   Number of children: 2   Years of education: PHD   Highest education level: Not on file  Occupational History   Occupation: retired    Fish farm manager: UNEMPLOYED  Tobacco Use   Smoking status: Never   Smokeless tobacco: Never  Vaping Use   Vaping Use: Never used  Substance and Sexual Activity   Alcohol use: Not Currently   Drug use: No   Sexual activity: Yes    Birth control/protection: Post-menopausal  Other Topics Concern   Not on file  Social History Narrative   Pt lives at home with her spouse.   She does not use caffeine.   Social Determinants of Health   Financial Resource Strain: Low Risk  (05/29/2020)   Overall Financial Resource Strain (CARDIA)    Difficulty of Paying Living Expenses: Not hard at all  Food Insecurity: No Food Insecurity (09/19/2018)   Hunger Vital Sign    Worried About Running Out of Food in the Last Year: Never true    Ran Out of Food in the Last Year: Never true  Transportation Needs: No Transportation Needs (09/19/2018)   PRAPARE - Hydrologist (Medical): No    Lack of Transportation (Non-Medical): No  Physical Activity:  Sufficiently Active (09/19/2018)   Exercise Vital Sign    Days of Exercise per Week: 4 days    Minutes of Exercise per Session: 50 min  Stress: No Stress Concern Present (09/19/2018)   [place]    Feeling of Stress : Not at all  Social Connections: [place] (09/19/2018)   Social Connection and Isolation Panel [NHANES]    Frequency of Communication with Friends and Family: More than three times a week    Frequency of Social Gatherings with Friends and Family: More than three times a week    Attends Religious Services: More than 4 times per year    Active Member of [employer] or Organizations: Yes    Attends Music therapist: More than 4 times per year    Marital Status: Married     Observations/Objective: Appears well in NAD   Assessment and Plan:  See Problem List for Assessment and Plan of chronic medical problems.   Follow Up Instructions:    I discussed the assessment and treatment plan with the patient. The patient was provided an opportunity to ask questions and all were answered. The patient agreed with the plan and demonstrated an understanding of the instructions.   The patient was advised to call back or seek an in-person evaluation if the symptoms worsen  or if the condition fails to improve as anticipated.    [name], MD

## 2023-01-05 ENCOUNTER — Emergency Department (HOSPITAL_BASED_OUTPATIENT_CLINIC_OR_DEPARTMENT_OTHER): Payer: Medicare Other

## 2023-01-05 ENCOUNTER — Emergency Department (HOSPITAL_BASED_OUTPATIENT_CLINIC_OR_DEPARTMENT_OTHER)
Admission: EM | Admit: 2023-01-05 | Discharge: 2023-01-05 | Disposition: A | Discharge status: Home / Self Care | Payer: Medicare Other | Attending: Emergency Medicine | Admitting: Emergency Medicine

## 2023-01-05 ENCOUNTER — Other Ambulatory Visit: Payer: Self-pay

## 2023-01-05 ENCOUNTER — Other Ambulatory Visit (HOSPITAL_BASED_OUTPATIENT_CLINIC_OR_DEPARTMENT_OTHER): Payer: Self-pay

## 2023-01-05 ENCOUNTER — Telehealth: Payer: Medicare Other | Admitting: Internal Medicine

## 2023-01-05 DIAGNOSIS — E876 Hypokalemia: Secondary | ICD-10-CM | POA: Diagnosis not present

## 2023-01-05 DIAGNOSIS — U071 COVID-19: Secondary | ICD-10-CM | POA: Diagnosis not present

## 2023-01-05 DIAGNOSIS — R059 Cough, unspecified: Secondary | ICD-10-CM | POA: Diagnosis present

## 2023-01-05 LAB — CBC WITH DIFFERENTIAL/PLATELET
Abs Immature Granulocytes: 0.01 10*3/uL (ref 0.00–0.07)
Basophils Absolute: 0 10*3/uL (ref 0.0–0.1)
Basophils Relative: 0 %
Eosinophils Absolute: 0 10*3/uL (ref 0.0–0.5)
Eosinophils Relative: 0 %
HCT: 37.2 % (ref 36.0–46.0)
Hemoglobin: 12.6 g/dL (ref 12.0–15.0)
Immature Granulocytes: 0 %
Lymphocytes Relative: 12 %
Lymphs Abs: 0.6 10*3/uL — ABNORMAL LOW (ref 0.7–4.0)
MCH: 29.2 pg (ref 26.0–34.0)
MCHC: 33.9 g/dL (ref 30.0–36.0)
MCV: 86.1 fL (ref 80.0–100.0)
Monocytes Absolute: 0.5 10*3/uL (ref 0.1–1.0)
Monocytes Relative: 11 %
Neutro Abs: 4 10*3/uL (ref 1.7–7.7)
Neutrophils Relative %: 77 %
Platelets: 160 10*3/uL (ref 150–400)
RBC: 4.32 MIL/uL (ref 3.87–5.11)
RDW: 13 % (ref 11.5–15.5)
WBC: 5.2 10*3/uL (ref 4.0–10.5)
nRBC: 0 % (ref 0.0–0.2)

## 2023-01-05 LAB — COMPREHENSIVE METABOLIC PANEL
ALT: 12 U/L (ref 0–44)
AST: 22 U/L (ref 15–41)
Albumin: 3.8 g/dL (ref 3.5–5.0)
Alkaline Phosphatase: 105 U/L (ref 38–126)
Anion gap: 9 (ref 5–15)
BUN: 9 mg/dL (ref 8–23)
CO2: 26 mmol/L (ref 22–32)
Calcium: 8.5 mg/dL — ABNORMAL LOW (ref 8.9–10.3)
Chloride: 98 mmol/L (ref 98–111)
Creatinine, Ser: 0.89 mg/dL (ref 0.44–1.00)
GFR, Estimated: >60 mL/min (ref >60)
Glucose, Bld: 84 mg/dL (ref 70–99)
Potassium: 3.1 mmol/L — ABNORMAL LOW (ref 3.5–5.1)
Sodium: 133 mmol/L — ABNORMAL LOW (ref 135–145)
Total Bilirubin: 0.8 mg/dL (ref 0.3–1.2)
Total Protein: 7.2 g/dL (ref 6.5–8.1)

## 2023-01-05 LAB — RESP PANEL BY RT-PCR (RSV, FLU A&B, COVID)  RVPGX2
Influenza A by PCR: NEGATIVE
Influenza B by PCR: NEGATIVE
Resp Syncytial Virus by PCR: NEGATIVE
SARS Coronavirus 2 by RT PCR: POSITIVE — AB

## 2023-01-05 LAB — LACTIC ACID, PLASMA: Lactic Acid, Venous: 1 mmol/L (ref 0.5–1.9)

## 2023-01-05 MED ORDER — SODIUM CHLORIDE 0.9 % IV BOLUS
500.0000 mL | Freq: Once | INTRAVENOUS | Status: AC
  Administered 2023-01-05: 500 mL via INTRAVENOUS

## 2023-01-05 MED ORDER — MOLNUPIRAVIR EUA 200MG CAPSULE
4.0000 | ORAL_CAPSULE | Freq: Two times a day (BID) | ORAL | 0 refills | Status: AC
  Filled 2023-01-05: qty 40, 5d supply, fill #0

## 2023-01-05 MED ORDER — PAXLOVID (300/100) 20 X 150 MG & 10 X 100MG PO TBPK
3.0000 | ORAL_TABLET | Freq: Two times a day (BID) | ORAL | 0 refills | Status: DC
  Filled 2023-01-05: qty 30, 5d supply, fill #0

## 2023-01-05 MED ORDER — POTASSIUM CHLORIDE CRYS ER 20 MEQ PO TBCR
40.0000 meq | EXTENDED_RELEASE_TABLET | Freq: Every day | ORAL | 0 refills | Status: DC
  Filled 2023-01-05: qty 6, 3d supply, fill #0

## 2023-01-05 NOTE — ED Notes (Signed)
ED Provider at bedside. 

## 2023-01-05 NOTE — ED Provider Notes (Signed)
Blood pressure (!) 154/86, pulse 85, temperature 98.6 F (37 C), temperature source Oral, resp. rate 16, height '5\' 4"'$  (1.626 m), last menstrual period 02/14/2014, SpO2 99 %.  Assuming care from Dr. Melina Copa.  In short, Cynthia Kirk is a 72 y.o. female with a chief complaint of URI .  Refer to the original H&P for additional details.  The current plan of care is to follow up on labs and reassess.  03:05 PM  Patient reevaluated.  Her tachycardia has improved.  She is not hypoxemic.  No increased work of breathing or altered mental status.  She has had 3 days of symptoms and would be a candidate for Paxlovid given her COVID diagnosis.  Mild hypokalemia.  Plan to replace this over 2 to 3 days and start her on Paxlovid after discussion.  We did discuss strict ED return precaution.     Margette Fast, MD 01/05/23 [phone]

## 2023-01-05 NOTE — ED Provider Notes (Signed)
[place] [facility] Provider Note   CSN: [id number] Arrival date & time: 01/05/23  1159     History  Chief Complaint  Patient presents with   URI    Cynthia Kirk is a 72 y.o. female.  She has significant pulmonary disease with sarcoidosis and follows with pulmonology.  She is complaining of being sick for the last 4 days.  Complaining of headache body aches sore throat cough, fevers and chills, and urinary frequency.  Husband just diagnosed with COVID.  The history is provided by the patient.  Influenza Presenting symptoms: cough, fatigue, fever, headache, myalgias, rhinorrhea and sore throat   Presenting symptoms: no diarrhea   Myalgias:    Location:  Generalized   Quality:  Aching   Severity:  Moderate   Onset quality:  Gradual   Timing:  Constant   Progression:  Unchanged Risk factors: being elderly, diabetes and sick contacts        Home Medications Prior to Admission medications   Medication Sig Start Date End Date Taking? Authorizing Provider  albuterol (PROVENTIL) (2.5 MG/3ML) 0.083% nebulizer solution TAKE 3ML(2.'5MG'$ ) BY NEBULIZATION EVERY 6 HOURS AS NEEDED FOR SHORTNESS OF BREATH 06/04/20   Chesley Mires, MD  albuterol (VENTOLIN HFA) 108 (90 Base) MCG/ACT inhaler INHALE 2 PUFF&nbsp;&nbsp;BY MOUTH&nbsp;&nbsp;EVERY 6 HOUR as NEEDED FOR WHEEZING ,SHORTNESS OF BREATH 06/04/20   Chesley Mires, MD  budesonide-formoterol (SYMBICORT) 80-4.5 MCG/ACT inhaler Inhale 2 puffs into the lungs 2 (two) times daily. 02/09/22   Chesley Mires, MD  chlorpheniramine (CHLOR-TRIMETON) 4 MG tablet Take 1 tablet (4 mg total) by mouth at bedtime. 02/09/22   Chesley Mires, MD  famotidine (PEPCID) 40 MG tablet Take 1 tablet (40 mg total) by mouth 2 (two) times daily as needed for heartburn or indigestion. 10/11/22   [name], MD  hydrochlorothiazide (HYDRODIURIL) 12.5 MG tablet Take 1 tablet (12.5 mg total) by mouth daily. 10/11/22   [name], MD   hydroquinone 4 % cream Apply 1 application topically 2 (two) times daily. 02/25/21   [name], MD  ipratropium (ATROVENT) 0.06 % nasal spray Place 2 sprays into both nostrils 2 (two) times daily. 07/15/20   Burns, Claudina Lick, MD  montelukast (SINGULAIR) 10 MG tablet TAKE 1 TABLET BY MOUTH EVERYDAY AT BEDTIME 07/08/22   Cobb, Karie Schwalbe, NP  OZEMPIC, 2 MG/DOSE, 8 MG/3ML SOPN Inject 2 mg into the skin once a week. 10/11/22   [name], MD  pantoprazole (PROTONIX) 40 MG tablet Take 1 tablet (40 mg total) by mouth daily. 10/11/22   [name], MD  sodium chloride (OCEAN) 0.65 % SOLN nasal spray Place 1 spray into both nostrils as needed for congestion. 01/14/22   Cobb, Karie Schwalbe, NP  triamcinolone ointment (KENALOG) 0.5 % APPLY TO AFFECTED AREA TWICE A DAY 10/11/22   [name], MD      Allergies    Aspirin, Ibuprofen, and Latex    Review of Systems   Review of Systems  Constitutional:  Positive for fatigue and fever.  HENT:  Positive for rhinorrhea and sore throat.   Eyes:  Negative for visual disturbance.  Respiratory:  Positive for cough.   Cardiovascular:  Negative for chest pain.  Gastrointestinal:  Negative for diarrhea.  Genitourinary:  Positive for frequency.  Musculoskeletal:  Positive for myalgias.  Skin:  Negative for rash.  Neurological:  Positive for headaches.    Physical Exam Updated Vital Signs BP (!) 179/86  Pulse 100   Temp 98.6 F (37 C) (Oral)   Resp 16   Ht '5\' 4"'$  (1.626 m)   LMP 02/14/2014   SpO2 100%   BMI 36.32 kg/m  Physical Exam Vitals and nursing note reviewed.  Constitutional:      General: She is not in acute distress.    Appearance: Normal appearance. She is well-developed.  HENT:     Head: Normocephalic and atraumatic.  Eyes:     Conjunctiva/sclera: Conjunctivae normal.  Cardiovascular:     Rate and Rhythm: Regular rhythm. Tachycardia present.     Heart sounds: No murmur heard. Pulmonary:     Effort: Pulmonary effort is normal.  No respiratory distress.     Breath sounds: Normal breath sounds.  Abdominal:     Palpations: Abdomen is soft.     Tenderness: There is no abdominal tenderness. There is no guarding or rebound.  Musculoskeletal:        General: No deformity.     Cervical back: Neck supple.  Skin:    General: Skin is warm and dry.     Capillary Refill: Capillary refill takes less than 2 seconds.  Neurological:     General: No focal deficit present.     Mental Status: She is alert.     ED Results / Procedures / Treatments   Labs (all labs ordered are listed, but only abnormal results are displayed) Labs Reviewed  RESP PANEL BY RT-PCR (RSV, FLU A&B, COVID)  [id number] - Abnormal; Notable for the following components:      Result Value   SARS Coronavirus 2 by RT PCR POSITIVE (*)    All other components within normal limits  CBC WITH DIFFERENTIAL/PLATELET - Abnormal; Notable for the following components:   Lymphs Abs 0.6 (*)    All other components within normal limits  COMPREHENSIVE METABOLIC PANEL - Abnormal; Notable for the following components:   Sodium 133 (*)    Potassium 3.1 (*)    Calcium 8.5 (*)    All other components within normal limits  CULTURE, BLOOD (ROUTINE X 2)  LACTIC ACID, PLASMA    EKG EKG Interpretation  Date/Time:  Wednesday January 05 2023 12:09:35 EST Ventricular Rate:  99 PR Interval:  144 QRS Duration: 84 QT Interval:  407 QTC Calculation: 523 R Axis:   104 Text Interpretation: Sinus rhythm Right axis deviation Borderline T abnormalities, anterior leads Prolonged QT interval copy Confirmed by Aletta Edouard [phone]) on 01/05/2023 12:16:43 PM  Radiology DG Chest 2 View  Result Date: 01/05/2023 CLINICAL DATA:  Cough, shortness of breath EXAM: CHEST - 2 VIEW COMPARISON:  01/14/2022 and 03/29/2018 radiograph, 02/09/2022 chest CT FINDINGS: Redemonstrated upper lobe reticular opacities, which appear decreased compared to the prior exam and correlate with the upper lung  predominant fibrotic interstitial lung disease seen on the prior CT. No new focal pulmonary opacity. No pleural effusion or pneumothorax. No acute osseous abnormality. IMPRESSION: No active cardiopulmonary disease. Electronically Signed   By: Merilyn Baba M.D.   On: 01/05/2023 12:57    Procedures Procedures    Medications Ordered in ED Medications  sodium chloride 0.9 % bolus 500 mL (has no administration in time range)    ED Course/ Medical Decision Making/ A&P Clinical Course as of 01/05/23 1314  Wed Jan 05, 2023  1250 Chest x-ray does not show any definite infiltrate.  She does have some reticular opacities that have been seen prior.  Awaiting radiology reading. [MB]    Clinical Course User  Index [MB] Hayden Rasmussen, MD                             Medical Decision Making Amount and/or Complexity of Data Reviewed Labs: ordered. Radiology: ordered.  Risk Prescription drug management.   This patient complains of headache body aches fatigue cough; this involves an extensive number of treatment Options and is a complaint that carries with it a high risk of complications and morbidity. The differential includes COVID, flu, dehydration, pneumonia, hypoxia  I ordered, reviewed and interpreted labs, which included CBC with normal white count normal hemoglobin, chemistries with mildly low potassium, lactate normal, COVID-positive I ordered medication IV fluids and reviewed PMP when indicated. I ordered imaging studies which included chest x-ray and I independently    visualized and interpreted imaging which showed no acute infiltrate Previous records obtained and reviewed in epic including prior pulmonology note Cardiac monitoring reviewed, normal sinus rhythm Social determinants considered, no significant barriers Critical Interventions: None  After the interventions stated above, I reevaluated the patient and found patient to be resting comfortably on room air satting  well. Admission and further testing considered, her care is signed out to Dr. Laverta Baltimore to follow-up on results of the rest of the lab work.  She likely can be discharged, consideration for Paxlovid.         Final Clinical Impression(s) / ED Diagnoses Final diagnoses:  COVID-19  Hypokalemia    Rx / DC Orders ED Discharge Orders          Ordered    nirmatrelvir & ritonavir (PAXLOVID, 300/100,) 20 x 150 MG & 10 x '100MG'$  TBPK  2 times daily,   Status:  Discontinued        01/05/23 1509    potassium chloride SA (KLOR-CON M) 20 MEQ tablet  Daily        01/05/23 1509    molnupiravir EUA (LAGEVRIO) 200 mg CAPS capsule  2 times daily        01/05/23 1519              Hayden Rasmussen, MD 01/05/23 [phone]

## 2023-01-05 NOTE — ED Triage Notes (Signed)
Pt c/o nasal congestion, cough, bodyaches, and chills x3 days.  Pt has not taken anything for symptoms.   Pt's husband was diagnosed w/ COVID yesterday.

## 2023-01-05 NOTE — ED Notes (Signed)
Unable to get an IV without [place]. Successful with first [place] attempt. Pt has very limited vasculature. Unable to find second site with PIV eval for second culture. EDP Butler aware of difficult anatomy, so held the second [facility].

## 2023-01-05 NOTE — Discharge Instructions (Signed)
You were seen in the emergency department today with [place].  I am starting you on antiviral medication to help with symptoms as well as keep your symptoms from getting worse.  You should remain in quarantine for the next 5 days at least.  If you begin to feel much worse suddenly and develop shortness of breath, chest pain, confusion, or severe dehydration you should return to the emergency department or call 911 for assistance.

## 2023-01-10 ENCOUNTER — Telehealth: Payer: Self-pay

## 2023-01-10 LAB — CULTURE, BLOOD (ROUTINE X 2): Culture: NO GROWTH

## 2023-01-10 NOTE — Telephone Encounter (Signed)
PA for Ozempic has been approved on 01/10/23.

## 2023-01-13 ENCOUNTER — Ambulatory Visit (HOSPITAL_BASED_OUTPATIENT_CLINIC_OR_DEPARTMENT_OTHER): Payer: Medicare Other | Admitting: Pulmonary Disease

## 2023-02-09 ENCOUNTER — Telehealth: Payer: Self-pay | Admitting: Pulmonary Disease

## 2023-02-09 ENCOUNTER — Ambulatory Visit (HOSPITAL_BASED_OUTPATIENT_CLINIC_OR_DEPARTMENT_OTHER): Payer: Medicare Other | Admitting: Pulmonary Disease

## 2023-02-09 ENCOUNTER — Other Ambulatory Visit (HOSPITAL_COMMUNITY): Payer: Self-pay | Admitting: Emergency Medicine

## 2023-02-09 NOTE — Telephone Encounter (Signed)
PT had to cancel apr for FU w/ Dr. Halford Chessman today due to bad back (Degenerative disc disease). Pls call though because she is getting over Covid and has coughing and congestion that exacerbates her back pain.  Also, address her resched appt for April. Can we change this to a [facility] visit?  [phone]

## 2023-02-09 NOTE — Telephone Encounter (Signed)
Okay to schedule video visit with me or NP.

## 2023-02-09 NOTE — Telephone Encounter (Signed)
Called and spoke with pt, scheduled for a video visit w/ [place] on 3/11 @ 11:30. She is post covid with a cough and having increased back pain. Given paxlovid for covid

## 2023-02-09 NOTE — Telephone Encounter (Signed)
Please advise if you're alright with video visit

## 2023-02-11 ENCOUNTER — Other Ambulatory Visit: Payer: Self-pay | Admitting: *Deleted

## 2023-02-11 DIAGNOSIS — J45909 Unspecified asthma, uncomplicated: Secondary | ICD-10-CM

## 2023-02-11 MED ORDER — MONTELUKAST SODIUM 10 MG PO TABS: ORAL_TABLET | ORAL | 1 refills | Status: DC

## 2023-02-14 ENCOUNTER — Telehealth: Payer: Medicare Other | Admitting: Nurse Practitioner

## 2023-02-16 ENCOUNTER — Other Ambulatory Visit (HOSPITAL_COMMUNITY): Payer: Self-pay

## 2023-02-28 ENCOUNTER — Ambulatory Visit: Payer: Medicare Other | Admitting: Internal Medicine

## 2023-03-09 ENCOUNTER — Ambulatory Visit: Payer: Medicare Other | Admitting: Internal Medicine

## 2023-03-14 ENCOUNTER — Encounter: Payer: Self-pay | Admitting: Internal Medicine

## 2023-03-14 NOTE — Patient Instructions (Addendum)
      Blood work was ordered.   The lab is on the first floor.    Medications changes include :   hydroxyzine for night, hydrocortisone cream.   A referral was ordered for dermatology.     Return in about 6 months (around 09/14/2023) for follow up.

## 2023-03-14 NOTE — Progress Notes (Signed)
Subjective:    Patient ID: Cynthia Kirk, female    DOB: 1951-02-19, 72 y.o.   MRN: [id number]     HPI Cynthia Kirk is here for follow up of her chronic medical problems.  Lower back pain - has been going on for a while.  Sees neurosurgery next week.  Also seeing ortho.  Was taking duexis but had to stop due to increased GERD.    Otherwise doing well.   Medications and allergies reviewed with patient and updated if appropriate.  Current Outpatient Medications on File Prior to Visit  Medication Sig Dispense Refill   albuterol (PROVENTIL) (2.5 MG/3ML) 0.083% nebulizer solution TAKE 3ML(2.5MG ) BY NEBULIZATION EVERY 6 HOURS AS NEEDED FOR SHORTNESS OF BREATH 120 mL 11   albuterol (VENTOLIN HFA) 108 (90 Base) MCG/ACT inhaler INHALE 2 PUFF&nbsp;&nbsp;BY MOUTH&nbsp;&nbsp;EVERY 6 HOUR as NEEDED FOR WHEEZING ,SHORTNESS OF BREATH 54 g 3   budesonide-formoterol (SYMBICORT) 80-4.5 MCG/ACT inhaler Inhale 2 puffs into the lungs 2 (two) times daily. 1 each 12   famotidine (PEPCID) 40 MG tablet Take 1 tablet (40 mg total) by mouth 2 (two) times daily as needed for heartburn or indigestion. 180 tablet 1   hydrochlorothiazide (HYDRODIURIL) 12.5 MG tablet Take 1 tablet (12.5 mg total) by mouth daily. 90 tablet 1   hydroquinone 4 % cream Apply 1 application topically 2 (two) times daily. 28.35 g 0   ipratropium (ATROVENT) 0.06 % nasal spray Place 2 sprays into both nostrils 2 (two) times daily. 15 mL 2   montelukast (SINGULAIR) 10 MG tablet TAKE 1 TABLET BY MOUTH EVERYDAY AT BEDTIME 90 tablet 1   OZEMPIC, 2 MG/DOSE, 8 MG/3ML SOPN Inject 2 mg into the skin once a week. 9 mL 1   pantoprazole (PROTONIX) 40 MG tablet Take 1 tablet (40 mg total) by mouth daily. 90 tablet 2   triamcinolone ointment (KENALOG) 0.5 % APPLY TO AFFECTED AREA TWICE A DAY 30 g 3   No current facility-administered medications on file prior to visit.     Review of Systems  Constitutional:  Negative for fever.  Respiratory:   Negative for cough, shortness of breath and wheezing.   Cardiovascular:  Positive for leg swelling (occ). Negative for chest pain and palpitations.  Skin:  Positive for rash (left leg only - itchy).  Neurological:  Negative for light-headedness and headaches.       Objective:   Vitals:   03/15/23 1542  BP: 122/76  Pulse: 75  Temp: 98 F (36.7 C)  SpO2: 99%   BP Readings from Last 3 Encounters:  03/15/23 122/76  01/05/23 (!) 157/88  02/09/22 120/80   Wt Readings from Last 3 Encounters:  03/15/23 189 lb (85.7 kg)  02/09/22 211 lb 9.6 oz (96 kg)  01/14/22 209 lb (94.8 kg)   Body mass index is 32.44 kg/m.    Physical Exam Constitutional:      General: She is not in acute distress.    Appearance: Normal appearance.  HENT:     Head: Normocephalic and atraumatic.  Eyes:     Conjunctiva/sclera: Conjunctivae normal.  Cardiovascular:     Rate and Rhythm: Normal rate and regular rhythm.     Heart sounds: Normal heart sounds.  Pulmonary:     Effort: Pulmonary effort is normal. No respiratory distress.     Breath sounds: Normal breath sounds. No wheezing.  Musculoskeletal:     Cervical back: Neck supple.     Right lower leg: No edema.  Left lower leg: No edema.  Lymphadenopathy:     Cervical: No cervical adenopathy.  Skin:    General: Skin is warm and dry.     Findings: Rash (left lower leg) present.  Neurological:     Mental Status: She is alert. Mental status is at baseline.  Psychiatric:        Mood and Affect: Mood normal.        Behavior: Behavior normal.        Lab Results  Component Value Date   WBC 5.2 01/05/2023   HGB 12.6 01/05/2023   HCT 37.2 01/05/2023   PLT 160 01/05/2023   GLUCOSE 84 01/05/2023   CHOL 193 11/22/2022   TRIG 49.0 11/22/2022   HDL 72.90 11/22/2022   LDLCALC 110 (H) 11/22/2022   ALT 12 01/05/2023   AST 22 01/05/2023   NA 133 (L) 01/05/2023   K 3.1 (L) 01/05/2023   CL 98 01/05/2023   CREATININE 0.89 01/05/2023   BUN 9  01/05/2023   CO2 26 01/05/2023   TSH 1.77 08/16/2017   HGBA1C 5.0 11/22/2022   MICROALBUR 1.4 11/22/2022     Assessment & Plan:    See Problem List for Assessment and Plan of chronic medical problems.

## 2023-03-15 ENCOUNTER — Encounter: Payer: Self-pay | Admitting: Internal Medicine

## 2023-03-15 ENCOUNTER — Ambulatory Visit (INDEPENDENT_AMBULATORY_CARE_PROVIDER_SITE_OTHER): Payer: Medicare Other | Admitting: Internal Medicine

## 2023-03-15 VITALS — BP 122/76 | HR 75 | Temp 98.0°F | Ht 64.0 in | Wt 189.0 lb

## 2023-03-15 DIAGNOSIS — E538 Deficiency of other specified B group vitamins: Secondary | ICD-10-CM

## 2023-03-15 DIAGNOSIS — E1169 Type 2 diabetes mellitus with other specified complication: Secondary | ICD-10-CM | POA: Diagnosis not present

## 2023-03-15 DIAGNOSIS — I10 Essential (primary) hypertension: Secondary | ICD-10-CM | POA: Diagnosis not present

## 2023-03-15 DIAGNOSIS — R21 Rash and other nonspecific skin eruption: Secondary | ICD-10-CM

## 2023-03-15 DIAGNOSIS — R600 Localized edema: Secondary | ICD-10-CM

## 2023-03-15 DIAGNOSIS — K219 Gastro-esophageal reflux disease without esophagitis: Secondary | ICD-10-CM

## 2023-03-15 MED ORDER — PANTOPRAZOLE SODIUM 40 MG PO TBEC: 40.0000 mg | DELAYED_RELEASE_TABLET | Freq: Every day | ORAL | 2 refills | Status: DC

## 2023-03-15 MED ORDER — HYDROXYZINE HCL 25 MG PO TABS: 25.0000 mg | ORAL_TABLET | Freq: Every evening | ORAL | 5 refills | Status: DC | PRN

## 2023-03-15 MED ORDER — FAMOTIDINE 40 MG PO TABS: 40.0000 mg | ORAL_TABLET | Freq: Two times a day (BID) | ORAL | 1 refills | Status: DC | PRN

## 2023-03-15 MED ORDER — HYDROCORTISONE 2.5 % EX CREA: TOPICAL_CREAM | Freq: Two times a day (BID) | CUTANEOUS | 0 refills | Status: AC

## 2023-03-15 MED ORDER — HYDROCHLOROTHIAZIDE 12.5 MG PO TABS: 12.5000 mg | ORAL_TABLET | Freq: Every day | ORAL | 1 refills | Status: DC

## 2023-03-15 MED ORDER — OZEMPIC (2 MG/DOSE) 8 MG/3ML ~~LOC~~ SOPN: 2.0000 mg | PEN_INJECTOR | SUBCUTANEOUS | 1 refills | Status: DC

## 2023-03-15 NOTE — Assessment & Plan Note (Signed)
Chronic   Lab Results  Component Value Date   HGBA1C 5.0 11/22/2022   Sugars very well controlled Check A1c Continue ozempic 2 mg weekly Stressed regular exercise, diabetic diet

## 2023-03-15 NOTE — Assessment & Plan Note (Addendum)
Chronic Intermittent Taking hctz 12.5 mg daily prn - takes 1-2 times a week

## 2023-03-15 NOTE — Assessment & Plan Note (Signed)
Chronic Left lower leg only Itchy Flares intermittently Hydrocortisone 2.5 % cream as needed Atarax 25 mg HS prn Will refer to derm

## 2023-03-15 NOTE — Assessment & Plan Note (Addendum)
Chronic GERD flared recently due to duexis - has stopped it Continue pantoprazole 40 mg daily in the morning, Pepcid 40 mg daily in the evening Can increase pantoprazole to 40 mg bid for short time period if needed

## 2023-03-15 NOTE — Assessment & Plan Note (Signed)
Chronic Taking B12 supplements

## 2023-03-16 LAB — LIPID PANEL
Cholesterol: 188 mg/dL (ref 0–200)
HDL: 65.8 mg/dL (ref >39.00)
LDL Cholesterol: 113 mg/dL — ABNORMAL HIGH (ref 0–99)
NonHDL: 122.06
Total CHOL/HDL Ratio: 3
Triglycerides: 44 mg/dL (ref 0.0–149.0)
VLDL: 8.8 mg/dL (ref 0.0–40.0)

## 2023-03-16 LAB — COMPREHENSIVE METABOLIC PANEL
ALT: 9 U/L (ref 0–35)
AST: 18 U/L (ref 0–37)
Albumin: 3.9 g/dL (ref 3.5–5.2)
Alkaline Phosphatase: 94 U/L (ref 39–117)
BUN: 16 mg/dL (ref 6–23)
CO2: 28 mEq/L (ref 19–32)
Calcium: 9.2 mg/dL (ref 8.4–10.5)
Chloride: 104 mEq/L (ref 96–112)
Creatinine, Ser: 0.79 mg/dL (ref 0.40–1.20)
GFR: 74.91 mL/min (ref >60.00)
Glucose, Bld: 83 mg/dL (ref 70–99)
Potassium: 3.9 mEq/L (ref 3.5–5.1)
Sodium: 139 mEq/L (ref 135–145)
Total Bilirubin: 0.4 mg/dL (ref 0.2–1.2)
Total Protein: 6.5 g/dL (ref 6.0–8.3)

## 2023-03-16 LAB — HEMOGLOBIN A1C: Hgb A1c MFr Bld: 5 % (ref 4.6–6.5)

## 2023-03-21 ENCOUNTER — Encounter (HOSPITAL_BASED_OUTPATIENT_CLINIC_OR_DEPARTMENT_OTHER): Payer: Self-pay | Admitting: Pulmonary Disease

## 2023-03-21 ENCOUNTER — Ambulatory Visit (INDEPENDENT_AMBULATORY_CARE_PROVIDER_SITE_OTHER): Payer: Medicare Other | Admitting: Pulmonary Disease

## 2023-03-21 VITALS — BP 134/86 | HR 81 | Temp 98.1°F | Ht 63.0 in | Wt 188.4 lb

## 2023-03-21 DIAGNOSIS — G4733 Obstructive sleep apnea (adult) (pediatric): Secondary | ICD-10-CM | POA: Diagnosis not present

## 2023-03-21 DIAGNOSIS — J45998 Other asthma: Secondary | ICD-10-CM

## 2023-03-21 DIAGNOSIS — J0100 Acute maxillary sinusitis, unspecified: Secondary | ICD-10-CM

## 2023-03-21 DIAGNOSIS — D860 Sarcoidosis of lung: Secondary | ICD-10-CM | POA: Diagnosis not present

## 2023-03-21 DIAGNOSIS — R634 Abnormal weight loss: Secondary | ICD-10-CM

## 2023-03-21 MED ORDER — ALBUTEROL SULFATE (2.5 MG/3ML) 0.083% IN NEBU
INHALATION_SOLUTION | RESPIRATORY_TRACT | 11 refills | Status: AC
Start: 2023-03-21 — End: ?

## 2023-03-21 MED ORDER — AZELASTINE HCL 0.15 % NA SOLN: 1.0000 | Freq: Every day | NASAL | 5 refills | Status: DC | PRN

## 2023-03-21 MED ORDER — OLOPATADINE HCL 0.2 % OP SOLN: 1.0000 [drp] | Freq: Every day | OPHTHALMIC | 3 refills | Status: AC | PRN

## 2023-03-21 NOTE — Patient Instructions (Signed)
Olopatadine 1 drop in each eye daily as needed to help with allergies  Astepro 1 spray in each nostril daily as needed to help with allergies  Will arrange for a new home nebulizer machine  Will arrange for a home sleep study  Follow up in 6 months

## 2023-03-21 NOTE — Progress Notes (Signed)
[facility]  Chief Complaint  Patient presents with   Follow-up    Follow up.    Constitutional:  BP 134/86 (BP Location: Left Arm, Patient Position: Sitting, Cuff Size: Normal)   Pulse 81   Temp 98.1 F (36.7 C) (Oral)   Ht  (1.6 m)   Wt 188 lb 6.4 oz (85.5 kg)   LMP 02/14/2014   SpO2 100%   BMI 33.37 kg/m   Past Medical History:  DM, Spinal stenosis, PNA, Anemia, HH, HSV, C diff, GERD, Anxiety, Colon polyp, COVID 24 October 2021  Past Surgical History:  Her  has a past surgical history that includes Vulvectomy (~ 2007); Ectopic pregnancy surgery (1976; ~ 1978); Urethral sling (2011); Dilation and curettage of uterus (2007); Posterior laminectomy / decompression lumbar spine (09/05/2013); Thoracic laminectomy (09/05/2013); Posterior lumbar fusion (09/05/2013); Hysteroscopy diagnostic (2011); Colposcopy vulva w/ biopsy; Maximum access (mas)posterior lumbar interbody fusion (plif) 1 level (N/A, 09/05/2013); and Lumbar laminectomy/decompression microdiscectomy (N/A, 09/05/2013).  Brief Summary:  Cynthia Kirk is a 72 y.o. female with pulmonary sarcoidosis, asthma, and obstructive sleep apnea.      Subjective:   She is with her husband.  She had COVID again in January 2024.  Recovered since then, but took her about 3 weeks.  She changed her diet and started ozempic.  She has lost about 40 lbs since last year.   She is sleeping better.  Not using CPAP.  Was having more mask trouble after weight loss.  Still snoring.  Hasn't needed symbicort.  Feels that singulair helps.  Having more trouble with allergies.  Gets itchy eyes and runny nose.  Flonase caused a headache when she used this before.  Physical Exam:   Appearance - well kempt   ENMT - no sinus tenderness, no oral exudate, no LAN, Mallampati 3 airway, no stridor  Respiratory - equal breath sounds bilaterally, no wheezing or rales  CV - s1s2 regular rate and rhythm, no  murmurs  Ext - no clubbing, no edema  Skin - no rashes  Psych - normal mood and affect    Pulmonary testing:  Spirometry 06/30/09 >> FEV1 1.69(87%), FVC 2.17(83%), FEV1% 78 PFT 11/15/14 >> FEV1 1.76 (87%), FEV1% 77, TLC 3.19 (63%), DLCO 74%, no BD PFT 12/27/17 >> FEV1 1.14 (59%), FEV1% 78, TLC 3.75 (74%), DLCO 61  Chest Imaging:  CT chest 07/17/14 >> upper lobe predominant BTX, GGO, nodularity no change since 2011 HRCT chest 12/20/17 >> upper lobe fibrosis HRCT chest 02/09/22 >> atherosclerosis, symmetric patchy peribronchovascular and subpleural reticulation and GGO throughout both lungs with associated mild-to-moderate traction bronchiectasis, architectural distortion and volume loss, upper lobe predominant.  The degree of volume loss, bronchiectasis and distortion has mildly progressed in the interval. The findings are otherwise not substantially changed  Sleep Tests:  PSG 05/28/14 >> AHI 5.8, SaO2 low 85%. REM AHI 29.3 ONO with CPAP 12/08/17 >> test time 2 hrs 26 min.  Average SpO2 94%, low SpO2 81%.  Spent 16.1 min with SpO2 < 88%. CPAP titration 02/21/18 >> CPAP 10 cm H2O; didn't need supplemental oxygen. CPAP 07/15/19 to 08/13/19 >> used on 30 of 30 nights with average 8 hrs 47 min.  Average AHI 1.1 with CPAP 10 cm H2O  Cardiac Tests:  Echo 03/09/17 >> EF 55 to 60%  Social History:  She  reports that she has never smoked. She has never used smokeless tobacco. She reports that she does not currently use alcohol. She  reports that she does not use drugs.  Family History:  Her family history includes Asthma in her daughter and son; Cancer in her paternal uncle; Diabetes in her father and another family member; Heart attack in her father and sister; High Cholesterol in her brother; Hypertension in her father; Kidney disease in her sister and another family member; Lung cancer in her maternal uncle; Migraines in her father; Osteoporosis in her sister and sister; Seizures in her father.      Assessment/Plan:   Moderate, persistent allergic asthma. - continue singulair - prn albuterol - will arrange for a new nebulizer machine  Upper airway cough syndrome from allergic rhinitis. - flonase cause headache - will have her try astepro - continue singulair  Allergic conjunctivitis. - prn olopatadine    Obstructive sleep apnea. - improved with weight loss - will arrange for a home sleep study and then determine if she should resume CPAP therapy  Vasomotor rhinitis. - previously seen by Dr. Christia Reading with ENT - prn atrovent nasal spray  Pulmonary sarcoidosis. - reviewed her CT chest and findings stable - don't think she needs additional therapy for sarcoidosis at this time  Time Spent Involved in Patient Care on Day of Examination:  38 minutes  Follow up:   Patient Instructions  Olopatadine 1 drop in each eye daily as needed to help with allergies  Astepro 1 spray in each nostril daily as needed to help with allergies  Will arrange for a new home nebulizer machine  Will arrange for a home sleep study  Follow up in 6 months   Medication List:   Allergies as of 03/21/2023       Reactions   Aspirin Hives, Shortness Of Breath, Other (See Comments)   Passed out   Ibuprofen Nausea And Vomiting, Other (See Comments)   Upsets stomach   Latex Rash        Medication List        Accurate as of March 21, 2023  3:49 PM. If you have any questions, ask your nurse or doctor.          STOP taking these medications    budesonide-formoterol 80-4.5 MCG/ACT inhaler Commonly known as: Symbicort Stopped by: Coralyn Helling, MD       TAKE these medications    albuterol 108 (90 Base) MCG/ACT inhaler Commonly known as: Ventolin HFA INHALE 2 PUFF&nbsp;&nbsp;BY MOUTH&nbsp;&nbsp;EVERY 6 HOUR as NEEDED FOR WHEEZING ,SHORTNESS OF BREATH   albuterol (2.5 MG/3ML) 0.083% nebulizer solution Commonly known as: PROVENTIL TAKE 3ML(2.5MG ) BY NEBULIZATION EVERY 6  HOURS AS NEEDED FOR SHORTNESS OF BREATH   Azelastine HCl 0.15 % Soln Commonly known as: Astepro Place 1 spray into the nose daily as needed. Started by: Coralyn Helling, MD   famotidine 40 MG tablet Commonly known as: PEPCID Take 1 tablet (40 mg total) by mouth 2 (two) times daily as needed for heartburn or indigestion.   hydrochlorothiazide 12.5 MG tablet Commonly known as: HYDRODIURIL Take 1 tablet (12.5 mg total) by mouth daily.   hydrocortisone 2.5 % cream Apply topically 2 (two) times daily.   hydroquinone 4 % cream Apply 1 application topically 2 (two) times daily.   hydrOXYzine 25 MG tablet Commonly known as: ATARAX Take 1 tablet (25 mg total) by mouth at bedtime as needed for itching.   ipratropium 0.06 % nasal spray Commonly known as: ATROVENT Place 2 sprays into both nostrils 2 (two) times daily.   montelukast 10 MG tablet Commonly known as: SINGULAIR TAKE  1 TABLET BY MOUTH EVERYDAY AT BEDTIME   Olopatadine HCl 0.2 % Soln Apply 1 drop to eye daily as needed (allergies). Started by: Coralyn Helling, MD   Ozempic (2 MG/DOSE) 8 MG/3ML Sopn Generic drug: Semaglutide (2 MG/DOSE) Inject 2 mg into the skin once a week.   pantoprazole 40 MG tablet Commonly known as: PROTONIX Take 1 tablet (40 mg total) by mouth daily.        Signature:  Coralyn Helling, MD [facility] Pager - [phone] 03/21/2023, 3:49 PM

## 2023-03-22 ENCOUNTER — Other Ambulatory Visit: Payer: Self-pay | Admitting: Neurological Surgery

## 2023-03-22 DIAGNOSIS — M5416 Radiculopathy, lumbar region: Secondary | ICD-10-CM

## 2023-03-29 ENCOUNTER — Encounter (HOSPITAL_BASED_OUTPATIENT_CLINIC_OR_DEPARTMENT_OTHER): Payer: Self-pay

## 2023-03-29 ENCOUNTER — Ambulatory Visit (HOSPITAL_BASED_OUTPATIENT_CLINIC_OR_DEPARTMENT_OTHER): Payer: Medicare Other | Admitting: Pulmonary Disease

## 2023-04-20 ENCOUNTER — Encounter (HOSPITAL_BASED_OUTPATIENT_CLINIC_OR_DEPARTMENT_OTHER): Payer: Medicare Other

## 2023-05-18 ENCOUNTER — Other Ambulatory Visit: Payer: Self-pay | Admitting: Internal Medicine

## 2023-06-03 ENCOUNTER — Telehealth: Payer: Self-pay | Admitting: Internal Medicine

## 2023-06-03 DIAGNOSIS — H3530 Unspecified macular degeneration: Secondary | ICD-10-CM

## 2023-06-03 NOTE — Telephone Encounter (Signed)
Patient called and said she has some questions that she forgot to ask last time she spoke with [place]. She would like a call back at [phone].

## 2023-06-03 NOTE — Telephone Encounter (Signed)
Called pt she states she need a referral to see Dr. Lawana Pai @ [facility]. She states she has macular degenerative in her (R) eye. Fax # [phone].  Also pt is wanting to get on handi-cap form fill-out. Will bring and put on MD desk.Marland KitchenRaechel Chute Notified pt form is ready for pick-up.Marland KitchenRaechel Chute

## 2023-06-04 NOTE — Telephone Encounter (Signed)
Referral orderd

## 2023-06-18 ENCOUNTER — Other Ambulatory Visit (HOSPITAL_COMMUNITY): Payer: Self-pay

## 2023-06-18 ENCOUNTER — Telehealth: Payer: Self-pay | Admitting: Pharmacy Technician

## 2023-06-18 NOTE — Telephone Encounter (Signed)
Pharmacy Patient Advocate Encounter  Received notification from [facility] that Prior Authorization for [facility] 2MG  has been APPROVED from 7.13.24 to 12.31.2099.[name]  PA #/Case ID/Reference #: [id number]  Copay is 11.20 per 84 days.   Pharmacy Patient Advocate Encounter   Received notification from CoverMyMeds that prior authorization for [facility] 2MG  is required/requested.   Insurance verification completed.   The patient is insured through [facility] .   Per test claim:  PA submitted to [employer] [facility] [place] Medicaid via CoverMyMeds Key/confirmation #/EOC [facility] Status is pending

## 2023-06-27 ENCOUNTER — Emergency Department (HOSPITAL_BASED_OUTPATIENT_CLINIC_OR_DEPARTMENT_OTHER): Payer: Medicare Other

## 2023-06-27 ENCOUNTER — Other Ambulatory Visit: Payer: Self-pay

## 2023-06-27 ENCOUNTER — Observation Stay (HOSPITAL_BASED_OUTPATIENT_CLINIC_OR_DEPARTMENT_OTHER)
Admission: EM | Admit: 2023-06-27 | Discharge: 2023-06-29 | Disposition: A | Discharge status: Home / Self Care | Payer: Medicare Other | Attending: Internal Medicine | Admitting: Internal Medicine

## 2023-06-27 DIAGNOSIS — I10 Essential (primary) hypertension: Secondary | ICD-10-CM | POA: Insufficient documentation

## 2023-06-27 DIAGNOSIS — F419 Anxiety disorder, unspecified: Secondary | ICD-10-CM | POA: Diagnosis present

## 2023-06-27 DIAGNOSIS — K219 Gastro-esophageal reflux disease without esophagitis: Secondary | ICD-10-CM | POA: Diagnosis present

## 2023-06-27 DIAGNOSIS — Z79899 Other long term (current) drug therapy: Secondary | ICD-10-CM | POA: Diagnosis not present

## 2023-06-27 DIAGNOSIS — R079 Chest pain, unspecified: Secondary | ICD-10-CM | POA: Diagnosis present

## 2023-06-27 DIAGNOSIS — E119 Type 2 diabetes mellitus without complications: Secondary | ICD-10-CM | POA: Diagnosis not present

## 2023-06-27 DIAGNOSIS — D860 Sarcoidosis of lung: Secondary | ICD-10-CM | POA: Diagnosis present

## 2023-06-27 DIAGNOSIS — R072 Precordial pain: Principal | ICD-10-CM | POA: Insufficient documentation

## 2023-06-27 DIAGNOSIS — D649 Anemia, unspecified: Secondary | ICD-10-CM | POA: Diagnosis present

## 2023-06-27 DIAGNOSIS — Z91040 Latex allergy status: Secondary | ICD-10-CM | POA: Diagnosis not present

## 2023-06-27 LAB — CBC
HCT: 34.9 % — ABNORMAL LOW (ref 36.0–46.0)
Hemoglobin: 11.4 g/dL — ABNORMAL LOW (ref 12.0–15.0)
MCH: 29.2 pg (ref 26.0–34.0)
MCHC: 32.7 g/dL (ref 30.0–36.0)
MCV: 89.3 fL (ref 80.0–100.0)
Platelets: 188 10*3/uL (ref 150–400)
RBC: 3.91 MIL/uL (ref 3.87–5.11)
RDW: 13 % (ref 11.5–15.5)
WBC: 3.7 10*3/uL — ABNORMAL LOW (ref 4.0–10.5)
nRBC: 0 % (ref 0.0–0.2)

## 2023-06-27 LAB — URINALYSIS, ROUTINE W REFLEX MICROSCOPIC
Bilirubin Urine: NEGATIVE
Glucose, UA: NEGATIVE mg/dL
Hgb urine dipstick: NEGATIVE
Ketones, ur: NEGATIVE mg/dL
Leukocytes,Ua: NEGATIVE
Nitrite: NEGATIVE
Protein, ur: NEGATIVE mg/dL
Specific Gravity, Urine: 1.01 (ref 1.005–1.030)
pH: 6.5 (ref 5.0–8.0)

## 2023-06-27 LAB — BASIC METABOLIC PANEL
Anion gap: 7 (ref 5–15)
BUN: 13 mg/dL (ref 8–23)
CO2: 25 mmol/L (ref 22–32)
Calcium: 8.7 mg/dL — ABNORMAL LOW (ref 8.9–10.3)
Chloride: 102 mmol/L (ref 98–111)
Creatinine, Ser: 0.9 mg/dL (ref 0.44–1.00)
GFR, Estimated: >60 mL/min (ref >60)
Glucose, Bld: 95 mg/dL (ref 70–99)
Potassium: 3.5 mmol/L (ref 3.5–5.1)
Sodium: 134 mmol/L — ABNORMAL LOW (ref 135–145)

## 2023-06-27 LAB — TROPONIN I (HIGH SENSITIVITY)
Troponin I (High Sensitivity): 4 ng/L (ref <18)
Troponin I (High Sensitivity): 4 ng/L (ref <18)

## 2023-06-27 MED ORDER — FENTANYL CITRATE PF 50 MCG/ML IJ SOSY
50.0000 ug | PREFILLED_SYRINGE | Freq: Once | INTRAMUSCULAR | Status: AC
  Administered 2023-06-27: 50 ug via INTRAVENOUS
  Filled 2023-06-27: qty 1

## 2023-06-27 MED ORDER — IOHEXOL 350 MG/ML SOLN
100.0000 mL | Freq: Once | INTRAVENOUS | Status: AC | PRN
  Administered 2023-06-27: 100 mL via INTRAVENOUS

## 2023-06-27 NOTE — ED Triage Notes (Signed)
Patient presents to ED via POV from home. Here with intermittent chest pain and hypertension. Endorses nausea, denies vomiting.

## 2023-06-27 NOTE — ED Provider Notes (Signed)
[facility] Provider Note   CSN: [id number] Arrival date & time: 06/27/23  1659     History  Chief Complaint  Patient presents with   Chest Pain    Cynthia Kirk is a 72 y.o. female.  The history is provided by the patient and the spouse.  Patient with history of hypertension, diabetes, sarcoidosis presents with chest pain. Patient reports over 5 hours ago she had onset of chest pressure that radiates to her back.  She also reports pain in her left upper arm. She reports mild nausea, but no fevers or vomiting. She reports her blood pressure has been more elevated than usual.  She reports after losing weight recently she has not required medications for diabetes or hypertension.  She is a non-smoker.  No previous history of CAD/CVA.  No previous history of VTE.  She does report more activity last weekend with walking, but no chest pain or fatigue while walking No arm or leg weakness.  She reports chronic neck pain is unchanged  She reports at times the pain is worse with movement of her upper body Past Medical History:  Diagnosis Date   Abnormal Pap smear 2008   Adenomatous colon polyp    Allergic rhinitis, cause unspecified    Anxiety    ASCUS with positive high risk HPV    Blood transfusion 1976   Due to ectopic pregnancy   Chronic back pain    Ejection fraction    EF 45-50%, echo, October 27, 2011   Endometrial mass 2011   Esophageal reflux    Fibroid    H/O Clostridium difficile infection 03/2010   H/O menorrhagia 2011   Herpes simplex type 2 infection complicating pregnancy    Hiatal hernia    Iron deficiency anemia    Morbid obesity (HCC) 2010   Obstructive sleep apnea (adult) (pediatric)    cpap   Panic attacks    Plantar fasciitis    Pneumonia 2005; 2007; 2009; 2011   Sarcoidosis 1996   @ [facility]   Simple endometrial hyperplasia 2011   Spinal stenosis, lumbar    s/p decompression laminectomy 09/2013   Syncope     ?? Syncope ??   Type 2 diabetes, diet controlled (HCC)    Unspecified asthma(493.90)    Urinary incontinence, mixed 2011   VIN II (vulvar intraepithelial neoplasia II)     Home Medications Prior to Admission medications   Medication Sig Start Date End Date Taking? Authorizing Provider  albuterol (PROVENTIL) (2.5 MG/3ML) 0.083% nebulizer solution TAKE 3ML(2.5MG ) BY NEBULIZATION EVERY 6 HOURS AS NEEDED FOR SHORTNESS OF BREATH 03/21/23   Coralyn Helling, MD  albuterol (VENTOLIN HFA) 108 (90 Base) MCG/ACT inhaler INHALE 2 PUFF&nbsp;&nbsp;BY MOUTH&nbsp;&nbsp;EVERY 6 HOUR as NEEDED FOR WHEEZING ,SHORTNESS OF BREATH 06/04/20   Coralyn Helling, MD  Azelastine HCl (ASTEPRO) 0.15 % SOLN Place 1 spray into the nose daily as needed. 03/21/23   Coralyn Helling, MD  famotidine (PEPCID) 40 MG tablet Take 1 tablet (40 mg total) by mouth 2 (two) times daily as needed for heartburn or indigestion. 03/15/23   Pincus Sanes, MD  hydrochlorothiazide (HYDRODIURIL) 12.5 MG tablet Take 1 tablet (12.5 mg total) by mouth daily. 03/15/23   Pincus Sanes, MD  hydrocortisone 2.5 % cream Apply topically 2 (two) times daily. 03/15/23   Pincus Sanes, MD  hydroquinone 4 % cream Apply 1 application topically 2 (two) times daily. 02/25/21   Pincus Sanes, MD  hydrOXYzine (  ATARAX) 25 MG tablet Take 1 tablet (25 mg total) by mouth at bedtime as needed for itching. 03/15/23   Pincus Sanes, MD  ipratropium (ATROVENT) 0.06 % nasal spray Place 2 sprays into both nostrils 2 (two) times daily. 07/15/20   Burns, Bobette Mo, MD  montelukast (SINGULAIR) 10 MG tablet TAKE 1 TABLET BY MOUTH EVERYDAY AT BEDTIME 02/11/23   Coralyn Helling, MD  Olopatadine HCl 0.2 % SOLN Apply 1 drop to eye daily as needed (allergies). 03/21/23   Coralyn Helling, MD  OZEMPIC, 2 MG/DOSE, 8 MG/3ML SOPN DIAL AND INJECT UNDER THE SKIN 2 MG WEEKLY 05/18/23   Pincus Sanes, MD  pantoprazole (PROTONIX) 40 MG tablet Take 1 tablet (40 mg total) by mouth daily. 03/15/23   Pincus Sanes, MD       Allergies    Aspirin, Ibuprofen, and Latex    Review of Systems   Review of Systems  Constitutional:  Negative for fever.  Cardiovascular:  Positive for chest pain.  Neurological:  Negative for weakness.    Physical Exam Updated Vital Signs BP (!) 120/106   Pulse 70   Temp 98 F (36.7 C) (Oral)   Resp 17   Ht 1.626 m (5\' 4" )   Wt 81.6 kg   LMP 02/14/2014   SpO2 100%   BMI 30.90 kg/m  Physical Exam CONSTITUTIONAL: Well developed/well nourished, appears younger than stated age HEAD: Normocephalic/atraumatic EYES: EOMI/PERRL ENMT: Mucous membranes moist NECK: supple no meningeal signs SPINE/BACK:entire spine nontender CV: S1/S2 noted, no murmurs/rubs/gallops noted LUNGS: Lungs are clear to auscultation bilaterally, no apparent distress ABDOMEN: soft, nontender NEURO: Pt is awake/alert/appropriate, moves all extremitiesx4.  No facial droop.  No arm or leg drift.  Equal handgrips noted EXTREMITIES: pulses normal/equalx4, full ROM No calf tenderness SKIN: warm, color normal PSYCH: no abnormalities of mood noted, alert and oriented to situation  ED Results / Procedures / Treatments   Labs (all labs ordered are listed, but only abnormal results are displayed) Labs Reviewed  BASIC METABOLIC PANEL - Abnormal; Notable for the following components:      Result Value   Sodium 134 (*)    Calcium 8.7 (*)    All other components within normal limits  CBC  TROPONIN I (HIGH SENSITIVITY)  TROPONIN I (HIGH SENSITIVITY)    EKG EKG Interpretation Date/Time:  Monday June 27 2023 17:15:26 EDT Ventricular Rate:  76 PR Interval:  154 QRS Duration:  84 QT Interval:  382 QTC Calculation: 429 R Axis:   91  Text Interpretation: Normal sinus rhythm Possible Right ventricular hypertrophy Abnormal ECG Confirmed by Zadie Rhine ([id number]) on 06/27/2023 5:21:32 PM  Radiology DG Chest 2 View  Result Date: 06/27/2023 CLINICAL DATA:  Chest pain. EXAM: CHEST - 2 VIEW COMPARISON:   January 05, 2023 FINDINGS: The heart size and mediastinal contours are within normal limits. Stable, chronic moderate severity bilateral upper lobe scarring and fibrotic changes are seen. There is no evidence of an acute infiltrate, pleural effusion or pneumothorax. Multilevel degenerative changes are seen throughout the thoracic spine. IMPRESSION: Chronic bilateral upper lobe scarring and fibrotic changes without evidence of acute or active cardiopulmonary disease. Electronically Signed   By: Aram Candela M.D.   On: 06/27/2023 17:55    Procedures Procedures    Medications Ordered in ED Medications  fentaNYL (SUBLIMAZE) injection 50 mcg (50 mcg Intravenous Given 06/27/23 2225)  iohexol (OMNIPAQUE) 350 MG/ML injection 100 mL (100 mLs Intravenous Contrast Given 06/27/23 2236)    ED  Course/ Medical Decision Making/ A&P Clinical Course as of 06/27/23 2305  Mon Jun 27, 2023  2302 Patient presented with chest pain rating to her back.  Somewhat reproducible with movement. No known history of coronary artery disease Due to chest pain rating to her back, CT dissection ordered [DW]  2304 Signed out to Dr Manus Gunning with labs/CT pending.  If all negative and improved she may be amenable to d/c home [DW]    Clinical Course User Index [DW] Zadie Rhine, MD              HEART Score: 4                Medical Decision Making Amount and/or Complexity of Data Reviewed Labs: ordered. Radiology: ordered.  Risk Prescription drug management.   This patient presents to the ED for concern of chest pain, this involves an extensive number of treatment options, and is a complaint that carries with it a high risk of complications and morbidity.  The differential diagnosis includes but is not limited to acute coronary syndrome, aortic dissection, pulmonary embolism, pericarditis, pneumothorax, pneumonia, myocarditis, pleurisy, esophageal rupture    Comorbidities that complicate the patient  evaluation: Patient's presentation is complicated by their history of sarcoidosis, diabetes   Additional history obtained: Additional history obtained from spouse Records reviewed Primary Care Documents  Lab Tests: I Ordered, and personally interpreted labs.  The pertinent results include: labs overall unremarkable thus far  Imaging Studies ordered: I ordered imaging studies including X-ray chest   I independently visualized and interpreted imaging which showed chronic findings noted, no acute findings I agree with the radiologist interpretation   Medicines ordered and prescription drug management: I ordered medication including fentanyl  for pain  Reevaluation of the patient after these medicines showed that the patient    stayed the same  Reevaluation: After the interventions noted above, I reevaluated the patient and found that they have :improved  Complexity of problems addressed: Patient's presentation is most consistent with  acute presentation with potential threat to life or bodily function            Final Clinical Impression(s) / ED Diagnoses Final diagnoses:  Precordial pain    Rx / DC Orders ED Discharge Orders     None         Zadie Rhine, MD 06/27/23 2305

## 2023-06-27 NOTE — ED Notes (Signed)
Attempted IV placement without success X 2 with ultrasound. Patient tolerated well.

## 2023-06-27 NOTE — ED Notes (Signed)
Hard stick , CBC delayed

## 2023-06-28 ENCOUNTER — Other Ambulatory Visit: Payer: Self-pay

## 2023-06-28 ENCOUNTER — Encounter (HOSPITAL_COMMUNITY): Payer: Self-pay | Admitting: Internal Medicine

## 2023-06-28 DIAGNOSIS — I10 Essential (primary) hypertension: Secondary | ICD-10-CM | POA: Diagnosis not present

## 2023-06-28 DIAGNOSIS — R072 Precordial pain: Secondary | ICD-10-CM | POA: Diagnosis present

## 2023-06-28 DIAGNOSIS — K219 Gastro-esophageal reflux disease without esophagitis: Secondary | ICD-10-CM | POA: Diagnosis not present

## 2023-06-28 DIAGNOSIS — E1169 Type 2 diabetes mellitus with other specified complication: Secondary | ICD-10-CM | POA: Diagnosis not present

## 2023-06-28 DIAGNOSIS — E119 Type 2 diabetes mellitus without complications: Secondary | ICD-10-CM | POA: Diagnosis not present

## 2023-06-28 DIAGNOSIS — Z91040 Latex allergy status: Secondary | ICD-10-CM | POA: Diagnosis not present

## 2023-06-28 DIAGNOSIS — Z79899 Other long term (current) drug therapy: Secondary | ICD-10-CM | POA: Diagnosis not present

## 2023-06-28 DIAGNOSIS — R0789 Other chest pain: Secondary | ICD-10-CM | POA: Diagnosis not present

## 2023-06-28 DIAGNOSIS — R079 Chest pain, unspecified: Secondary | ICD-10-CM | POA: Diagnosis not present

## 2023-06-28 DIAGNOSIS — D649 Anemia, unspecified: Secondary | ICD-10-CM | POA: Diagnosis present

## 2023-06-28 DIAGNOSIS — F419 Anxiety disorder, unspecified: Secondary | ICD-10-CM | POA: Diagnosis not present

## 2023-06-28 LAB — HEMOGLOBIN A1C
Hgb A1c MFr Bld: 4.8 % (ref 4.8–5.6)
Mean Plasma Glucose: 91.06 mg/dL

## 2023-06-28 LAB — LIPID PANEL
Cholesterol: 157 mg/dL (ref 0–200)
HDL: 62 mg/dL (ref >40)
LDL Cholesterol: 88 mg/dL (ref 0–99)
Total CHOL/HDL Ratio: 2.5 RATIO
Triglycerides: 33 mg/dL (ref <150)
VLDL: 7 mg/dL (ref 0–40)

## 2023-06-28 LAB — TSH: TSH: 0.819 u[IU]/mL (ref 0.350–4.500)

## 2023-06-28 MED ORDER — PANTOPRAZOLE SODIUM 40 MG PO TBEC
40.0000 mg | DELAYED_RELEASE_TABLET | Freq: Every day | ORAL | Status: DC
  Administered 2023-06-28: 40 mg via ORAL
  Filled 2023-06-28: qty 1

## 2023-06-28 MED ORDER — FAMOTIDINE 20 MG PO TABS
20.0000 mg | ORAL_TABLET | Freq: Two times a day (BID) | ORAL | Status: DC | PRN
  Administered 2023-06-28: 20 mg via ORAL
  Filled 2023-06-28: qty 1

## 2023-06-28 MED ORDER — ENOXAPARIN SODIUM 40 MG/0.4ML IJ SOSY
40.0000 mg | PREFILLED_SYRINGE | INTRAMUSCULAR | Status: DC
  Administered 2023-06-28: 40 mg via SUBCUTANEOUS
  Filled 2023-06-28 (×2): qty 0.4

## 2023-06-28 MED ORDER — ACETAMINOPHEN 325 MG PO TABS
650.0000 mg | ORAL_TABLET | ORAL | Status: DC | PRN
  Administered 2023-06-28: 650 mg via ORAL
  Filled 2023-06-28: qty 2

## 2023-06-28 MED ORDER — NITROGLYCERIN 0.4 MG SL SUBL
0.4000 mg | SUBLINGUAL_TABLET | Freq: Once | SUBLINGUAL | Status: AC
  Administered 2023-06-28: 0.4 mg via SUBLINGUAL
  Filled 2023-06-28: qty 1

## 2023-06-28 MED ORDER — HYDROXYZINE HCL 25 MG PO TABS: 25.0000 mg | ORAL_TABLET | Freq: Every evening | ORAL | Status: DC | PRN

## 2023-06-28 MED ORDER — HYDROMORPHONE HCL 1 MG/ML IJ SOLN
1.0000 mg | Freq: Once | INTRAMUSCULAR | Status: AC
  Administered 2023-06-28: 1 mg via INTRAVENOUS
  Filled 2023-06-28: qty 1

## 2023-06-28 MED ORDER — ONDANSETRON HCL 4 MG/2ML IJ SOLN: 4.0000 mg | Freq: Four times a day (QID) | INTRAMUSCULAR | Status: DC | PRN

## 2023-06-28 MED ORDER — FENTANYL CITRATE PF 50 MCG/ML IJ SOSY
50.0000 ug | PREFILLED_SYRINGE | Freq: Once | INTRAMUSCULAR | Status: AC
  Administered 2023-06-28: 50 ug via INTRAVENOUS
  Filled 2023-06-28: qty 1

## 2023-06-28 MED ORDER — ALBUTEROL SULFATE (2.5 MG/3ML) 0.083% IN NEBU: 2.5000 mg | INHALATION_SOLUTION | Freq: Four times a day (QID) | RESPIRATORY_TRACT | Status: DC | PRN

## 2023-06-28 NOTE — Progress Notes (Signed)
   Patient Name: Cynthia Kirk, buffone DOB: December 26, 1950 MRN: [id number] Transferring facility: [facility] Requesting provider: rancour, md Reason for transfer: chest pain 72 yo AAF with hx of DM2, HTN, sarcoidosis presented to ER with 5 hrs of CP.  trop negative x 2. EKG no acute changes. CTA negative. Going to: [facility] Admission Status: obs Bed Type: med/tele To Do: keep NPO. have cardiology fellow see patient and order stress test.  [facility] will assume care on arrival to accepting facility. Until arrival, medical decision making responsibilities remain with the EDP.  However, [facility] available 24/7 for questions and assistance.   Nursing staff please page [facility] Admits and Consults [phone]) as soon as the patient arrives to the hospital.  Carollee Herter, DO [facility]

## 2023-06-28 NOTE — ED Notes (Signed)
Pt updated on bed request I told her we had called and that  hopefully beds would become available about 1200

## 2023-06-28 NOTE — Consult Note (Signed)
Cardiology Consultation   Patient ID: Cynthia Kirk MRN: [id number]; DOB: [birth year]  Admit date: 06/27/2023 Date of Consult: 06/28/2023  PCP:  Pincus Sanes, MD   [facility] Providers Cardiologist:  None        Patient Profile:   Cynthia Kirk is a 72 y.o. female with a hx of sarcoidosis who is being seen 06/28/2023 for the evaluation of chest pain at the request of Dr Rachael Darby.  History of Present Illness:   [name] developed chest discomfort yesterday.  She has had intermittent symptoms in the past, but yesterday she developed constant substernal pressure radiating to the back with associated numbness down the left arm.  Symptoms were constant for 20 to 30 minutes and then occurred intermittently throughout the day.  Her bigger concern is that she has been experiencing left neck pain that has also been constant.  States that she has to turn her entire body and she is unable to turn her neck because of pain limiting that motion.  She has not had shortness of breath.  She occasionally has heart palpitations.  Today, she feels a slight squeezing in her chest but symptoms have nearly resolved.  She has had no exertional chest pain or pressure.  Her cardiac evaluation here in the hospital has included high-sensitivity troponins which are normal and an EKG which shows no ischemic changes.   Past Medical History:  Diagnosis Date   Abnormal Pap smear 2008   Adenomatous colon polyp    Allergic rhinitis, cause unspecified    Anxiety    ASCUS with positive high risk HPV    Blood transfusion 1976   Due to ectopic pregnancy   Chronic back pain    Ejection fraction    EF 45-50%, echo, October 27, 2011   Endometrial mass 2011   Esophageal reflux    Fibroid    H/O Clostridium difficile infection 03/2010   H/O menorrhagia 2011   Herpes simplex type 2 infection complicating pregnancy    Hiatal hernia    Iron deficiency anemia    Morbid obesity (HCC) 2010   Obstructive sleep  apnea (adult) (pediatric)    cpap   Panic attacks    Plantar fasciitis    Pneumonia 2005; 2007; 2009; 2011   Sarcoidosis 1996   @ [facility]   Simple endometrial hyperplasia 2011   Spinal stenosis, lumbar    s/p decompression laminectomy 09/2013   Syncope    ?? Syncope ??   Type 2 diabetes, diet controlled (HCC)    Unspecified asthma(493.90)    Urinary incontinence, mixed 2011   VIN II (vulvar intraepithelial neoplasia II)     Past Surgical History:  Procedure Laterality Date   COLPOSCOPY VULVA W/ BIOPSY     "I've had maybe 3" (09/05/2013)   DILATION AND CURETTAGE OF UTERUS  2007   ECTOPIC PREGNANCY SURGERY  1976; ~ 1978   HYSTEROSCOPY DIAGNOSTIC  2011   LUMBAR LAMINECTOMY/DECOMPRESSION MICRODISCECTOMY N/A 09/05/2013   Procedure: Thoracic eleven-twelve Thoracic Laminectomy;  Surgeon: Tia Alert, MD;  Location: [facility];  Service: Neurosurgery;  Laterality: N/A;  Thoracic eleven-twelve Thoracic Laminectomy   MAXIMUM ACCESS (MAS)POSTERIOR LUMBAR INTERBODY FUSION (PLIF) 1 LEVEL N/A 09/05/2013   Procedure: Lumbar four-five Maximum Access Surgery  Posterior lumbar interbody fusion;  Surgeon: Tia Alert, MD;  Location: [facility];  Service: Neurosurgery;  Laterality: N/A;  Lumbar four-five Maximum Access Surgery  Posterior lumbar interbody fusion   POSTERIOR LAMINECTOMY / DECOMPRESSION LUMBAR SPINE  09/05/2013   POSTERIOR LUMBAR FUSION  09/05/2013   THORACIC LAMINECTOMY  09/05/2013   URETHRAL SLING  2011   VULVECTOMY  ~ 2007   "for cancerous cells" (09/05/2013)     Home Medications:  Prior to Admission medications   Medication Sig Start Date End Date Taking? Authorizing Provider  albuterol (PROVENTIL) (2.5 MG/3ML) 0.083% nebulizer solution TAKE 3ML(2.5MG ) BY NEBULIZATION EVERY 6 HOURS AS NEEDED FOR SHORTNESS OF BREATH Patient taking differently: Take 2.5 mg by nebulization every 6 (six) hours as needed for wheezing or shortness of breath. 03/21/23  Yes Coralyn Helling, MD  albuterol  (VENTOLIN HFA) 108 (90 Base) MCG/ACT inhaler INHALE 2 PUFF&nbsp;&nbsp;BY MOUTH&nbsp;&nbsp;EVERY 6 HOUR as NEEDED FOR WHEEZING ,SHORTNESS OF BREATH Patient taking differently: Inhale 2 puffs into the lungs every 6 (six) hours as needed for wheezing or shortness of breath. INHALE 2 PUFF  BY MOUTH  EVERY 6 HOUR as NEEDED FOR WHEEZING ,SHORTNESS OF BREATH 06/04/20  Yes Coralyn Helling, MD  famotidine (PEPCID) 40 MG tablet Take 1 tablet (40 mg total) by mouth 2 (two) times daily as needed for heartburn or indigestion. 03/15/23  Yes Burns, Bobette Mo, MD  hydrochlorothiazide (HYDRODIURIL) 12.5 MG tablet Take 1 tablet (12.5 mg total) by mouth daily. 03/15/23  Yes Burns, Bobette Mo, MD  hydrocortisone 2.5 % cream Apply topically 2 (two) times daily. 03/15/23  Yes Burns, Bobette Mo, MD  hydroquinone 4 % cream Apply 1 application topically 2 (two) times daily. Patient taking differently: Apply 1 application  topically daily as needed (itching). 02/25/21  Yes Burns, Bobette Mo, MD  hydrOXYzine (ATARAX) 25 MG tablet Take 1 tablet (25 mg total) by mouth at bedtime as needed for itching. 03/15/23  Yes Burns, Bobette Mo, MD  ipratropium (ATROVENT) 0.06 % nasal spray Place 2 sprays into both nostrils 2 (two) times daily. 07/15/20  Yes Burns, Bobette Mo, MD  montelukast (SINGULAIR) 10 MG tablet TAKE 1 TABLET BY MOUTH EVERYDAY AT BEDTIME Patient taking differently: Take 10 mg by mouth at bedtime. 02/11/23  Yes Coralyn Helling, MD  Olopatadine HCl 0.2 % SOLN Apply 1 drop to eye daily as needed (allergies). Patient taking differently: Place 1 drop into both eyes daily as needed (allergies). 03/21/23  Yes Sood, Laurier Nancy, MD  OZEMPIC, 2 MG/DOSE, 8 MG/3ML SOPN DIAL AND INJECT UNDER THE SKIN 2 MG WEEKLY Patient taking differently: Inject 2 mg into the skin once a week. Tuesday 05/18/23  Yes Burns, Bobette Mo, MD  pantoprazole (PROTONIX) 40 MG tablet Take 1 tablet (40 mg total) by mouth daily. 03/15/23  Yes Burns, Bobette Mo, MD  Azelastine HCl (ASTEPRO) 0.15 % SOLN Place  1 spray into the nose daily as needed. Patient not taking: Reported on 06/28/2023 03/21/23   Coralyn Helling, MD    Inpatient Medications: Scheduled Meds:  enoxaparin (LOVENOX) injection  40 mg Subcutaneous Q24H   pantoprazole  40 mg Oral Daily   Continuous Infusions:  PRN Meds: acetaminophen, albuterol, famotidine, hydrOXYzine, ondansetron (ZOFRAN) IV  Allergies:    Allergies  Allergen Reactions   Aspirin Hives, Shortness Of Breath and Other (See Comments)    Passed out   Ibuprofen Nausea And Vomiting and Other (See Comments)    Upsets stomach   Latex Rash    Social History:   Social History   Socioeconomic History   Marital status: Married    Spouse name: Not on file   Number of children: 2   Years of education: PHD   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   She does not use caffeine.   Social Determinants of Health   Financial Resource Strain: Medium Risk (02/24/2023)   Overall Financial Resource Strain (CARDIA)    Difficulty of Paying Living Expenses: Somewhat hard  Food Insecurity: No Food Insecurity (06/28/2023)   Hunger Vital Sign    Worried About Running Out of Food in the Last Year: Never true    Ran Out of Food in the Last Year: Never true  Transportation Needs: No Transportation Needs (06/28/2023)   PRAPARE - Administrator, Civil Service (Medical): No    Lack of Transportation (Non-Medical): No  Physical Activity: Insufficiently Active (02/24/2023)   Exercise Vital Sign    Days of Exercise per Week: 5 days    Minutes of Exercise per  Session: 20 min  Stress: Stress Concern Present (02/24/2023)   [employer] of Occupational Health - Occupational Stress Questionnaire    Feeling of Stress : To some extent  Social Connections: Socially Integrated (02/24/2023)   Social Connection and Isolation Panel [NHANES]    Frequency of Communication with Friends and Family: Twice a week    Frequency of Social Gatherings with Friends and Family: Once a week    Attends Religious Services: More than 4 times per year    Active Member of [employer] or Organizations: Yes    Attends Engineer, structural: More than 4 times per year    Marital Status: Married  Catering manager Violence: Not At Risk (06/28/2023)   Humiliation, Afraid, Rape, and Kick questionnaire    Fear of Current or Ex-Partner: No    Emotionally Abused: No    Physically Abused: No    Sexually Abused: No    Family History:   Family History  Problem Relation Age of Onset   Heart attack Father    Hypertension Father    Diabetes Father    Seizures Father    Migraines Father    Kidney disease Sister    Osteoporosis Sister    Heart attack Sister    Cancer Paternal Uncle    Asthma Daughter    Asthma Son    Osteoporosis Sister    Lung cancer Maternal Uncle    Kidney disease Other    Diabetes Other    High Cholesterol Brother        x2   Colon cancer Neg Hx    Colon polyps Neg Hx    Liver disease Neg Hx    Stomach cancer Neg Hx    Pancreatic cancer Neg Hx    Esophageal cancer Neg Hx      ROS:  Please see the history of present illness.  All other ROS reviewed and negative.     Physical Exam/Data:   Vitals:   06/28/23 1100 06/28/23 1130 06/28/23 1200 06/28/23 1352  BP: 132/76 134/80 96/60 (!) 152/92  Pulse: 74  74 79  Resp: 14 13 16 15   Temp:    98 F (36.7 C)  TempSrc:    Oral  SpO2: [name])  85%  100% 100%  Weight:    52.1 kg  Height:    5\' 4"  (1.626 m)   No intake or output data in the 24 hours ending 06/28/23 1708    06/28/2023    1:52 PM  06/27/2023    5:12 PM 03/21/2023    3:24 PM  Last 3 Weights  Weight (lbs) 114 lb 13.8 oz 180 lb 188 lb 6.4 oz  Weight (kg) 52.1 kg 81.647 kg 85.458 kg     Body mass index is 19.72 kg/m.  General:  Well nourished, well developed, in no acute distress HEENT: normal Neck: no JVD Vascular: No carotid bruits; Distal pulses 2+ bilaterally Cardiac:  normal S1, S2; RRR; no murmur  Lungs:  clear to auscultation bilaterally, no wheezing, rhonchi or rales  Abd: soft, nontender, no hepatomegaly  Ext: no edema Musculoskeletal:  No deformities, BUE and BLE strength normal and equal Skin: warm and dry  Neuro:  CNs 2-12 intact, no focal abnormalities noted Psych:  Normal affect   EKG:  The EKG was personally reviewed and demonstrates: Normal sinus rhythm, rightward axis, no significant ST or T wave changes  Telemetry:  Telemetry was personally reviewed and demonstrates: Sinus rhythm without significant arrhythmia  Relevant CV Studies: 2D echocardiogram pending.  CTA chest, abdomen, and pelvis reviewed.  Laboratory Data:  High Sensitivity Troponin:   Recent Labs  Lab 06/27/23 1713 06/27/23 2258  TROPONINIHS 4 4     Chemistry Recent Labs  Lab 06/27/23 1713  NA 134*  K 3.5  CL 102  CO2 25  GLUCOSE 95  BUN 13  CREATININE 0.90  CALCIUM 8.7*  GFRNONAA >60  ANIONGAP 7    No results for input(s): "PROT", "ALBUMIN", "AST", "ALT", "ALKPHOS", "BILITOT" in the last 168 hours. Lipids No results for input(s): "CHOL", "TRIG", "HDL", "LABVLDL", "LDLCALC", "CHOLHDL" in the last 168 hours.  Hematology Recent Labs  Lab 06/27/23 2258  WBC 3.7*  RBC 3.91  HGB 11.4*  HCT 34.9*  MCV 89.3  MCH 29.2  MCHC 32.7  RDW 13.0  PLT 188   Thyroid No results for input(s): "TSH", "FREET4" in the last 168 hours.  BNPNo results for input(s): "BNP", "PROBNP" in the last 168 hours.  DDimer No results for input(s): "DDIMER" in the last 168 hours.   Radiology/Studies:  CT Angio Chest/Abd/Pel for  Dissection W and/or Wo Contrast  Result Date: 06/27/2023 CLINICAL DATA:  Acute aortic syndrome suspected EXAM: CT ANGIOGRAPHY CHEST, ABDOMEN AND PELVIS TECHNIQUE: Non-contrast CT of the chest was initially obtained. Multidetector CT imaging through the chest, abdomen and pelvis was performed using the standard protocol during bolus administration of intravenous contrast. Multiplanar reconstructed images and MIPs were obtained and reviewed to evaluate the vascular anatomy. RADIATION DOSE REDUCTION: This exam was performed according to the departmental dose-optimization program which includes automated exposure control, adjustment of the mA and/or kV according to patient size and/or use of iterative reconstruction technique. CONTRAST:  OMNIPAQUE IOHEXOL 350 MG/ML SOLN COMPARISON:  Chest CT 02/09/2022. CT abdomen and pelvis 03/20/2013 FINDINGS: CTA CHEST FINDINGS Cardiovascular: Preferential opacification of the thoracic aorta. No evidence of thoracic aortic aneurysm or dissection. Normal heart size. No pericardial effusion. There is mild calcified atherosclerotic disease throughout the aorta. Mediastinum/Nodes: No enlarged mediastinal, hilar, or axillary lymph nodes. Thyroid gland, trachea, and esophagus demonstrate no significant findings. Lungs/Pleura: Ground-glass and interstitial opacities in the bilateral upper lungs appear unchanged from the prior examination. There is no new focal lung infiltrate, pleural effusion  or pneumothorax. Musculoskeletal: No chest wall abnormality. No acute or significant osseous findings. Review of the MIP images confirms the above findings. CTA ABDOMEN AND PELVIS FINDINGS VASCULAR Aorta: Normal caliber aorta without aneurysm, dissection, vasculitis or significant stenosis. Celiac: Patent without evidence of aneurysm, dissection, vasculitis or significant stenosis. SMA: Patent without evidence of aneurysm, dissection, vasculitis or significant stenosis. Renals: Both renal  arteries are patent without evidence of aneurysm, dissection, vasculitis, fibromuscular dysplasia or significant stenosis. IMA: Patent without evidence of aneurysm, dissection, vasculitis or significant stenosis. Inflow: Patent without evidence of aneurysm, dissection, vasculitis or significant stenosis. Veins: No obvious venous abnormality within the limitations of this arterial phase study. Review of the MIP images confirms the above findings. NON-VASCULAR Hepatobiliary: No focal liver abnormality is seen. No gallstones, gallbladder wall thickening, or biliary dilatation. Pancreas: Unremarkable. No pancreatic ductal dilatation or surrounding inflammatory changes. Spleen: Normal in size without focal abnormality. Adrenals/Urinary Tract: There is diffuse bladder wall thickening versus normal under distension. Bilateral renal cysts are present measuring 2.7 cm on the right and 1.7 cm on left. Adrenal glands are within normal limits. Stomach/Bowel: Stomach is within normal limits. No evidence of bowel wall thickening, distention, or inflammatory changes. The appendix is not seen. Lymphatic: No enlarged lymph nodes are identified. Reproductive: Uterus and bilateral adnexa are unremarkable. Other: There is a small umbilical hernia containing nondilated bowel. There is no ascites. Musculoskeletal: L4-5 fusion hardware is present. Review of the MIP images confirms the above findings. IMPRESSION: 1. No evidence for aortic dissection or aneurysm. 2. Diffuse bladder wall thickening versus normal under distension. Correlate clinically for cystitis. 3. Stable ground-glass and interstitial opacities in the upper lungs compatible with fibrotic interstitial lung disease. 4. Small umbilical hernia containing nondilated bowel. Electronically Signed   By: Darliss Cheney M.D.   On: 06/27/2023 23:09   DG Chest 2 View  Result Date: 06/27/2023 CLINICAL DATA:  Chest pain. EXAM: CHEST - 2 VIEW COMPARISON:  January 05, 2023 FINDINGS:  The heart size and mediastinal contours are within normal limits. Stable, chronic moderate severity bilateral upper lobe scarring and fibrotic changes are seen. There is no evidence of an acute infiltrate, pleural effusion or pneumothorax. Multilevel degenerative changes are seen throughout the thoracic spine. IMPRESSION: Chronic bilateral upper lobe scarring and fibrotic changes without evidence of acute or active cardiopulmonary disease. Electronically Signed   By: Aram Candela M.D.   On: 06/27/2023 17:55     Assessment and Plan:   Atypical chest pain: The patient's chest pain has typical and atypical features.  Her chest wall is tender to palpation and this reproduces her pain.  Her neck pain is clearly musculoskeletal and exacerbated by movement.  With normal high-sensitivity troponins and an unremarkable EKG, I think there is a low likelihood that she is having cardiac symptoms.  It is reasonable to consider an outpatient Myoview scan for risk stratification.  I reviewed her CTA of the chest, abdomen, and pelvis that was done yesterday.  I reviewed all of her hospital lab work and radiographic studies.  I do not think she needs any more cardiac evaluation here in the hospital.  Will arrange for an outpatient Lexiscan Myoview stress test and follow-up once that is completed.  Conveyed the plan to the patient who understands and agrees. Sarcoidosis: Appears stable, followed by pulmonary.  Fibrotic lung disease noted on her CTA study. Diet controlled type 2 diabetes: The patient has lost a great deal of weight on Ozempic.  Appears to be doing  well in this regard.  Disposition: Defer to outpatient testing as outlined above.  Will schedule a Lexiscan Myoview stress test.  No further inpatient cardiac testing indicated at this time.  Will defer further evaluation of her neck pain to the primary team as this does not appear to be cardiovascular in nature.   Risk Assessment/Risk Scores:                 For questions or updates, please contact [facility] Please consult [email or web address] for contact info under    Signed, Tonny Bollman, MD  06/28/2023 5:08 PM

## 2023-06-28 NOTE — ED Notes (Signed)
Introduced myself to pt and gave her   fruit bars , given wash cloth to wash hands and face

## 2023-06-28 NOTE — ED Provider Notes (Signed)
Assumed from Dr. Bebe Shaggy.  Patient with a history of hypertension, diabetes, sarcoidosis here with chest pain and pressure that radiates to her back.  EKG without acute ischemia.  Pending second troponin as well as CT angiogram.  Troponin negative x 2.  CTA negative for arctic dissection or other acute pathology.  Heart score is 4. She reports still ongoing chest pain and pressure not relieved with medications. Has aspirin allergy.  Last stress in 2018 was reassuring. Still having chest pressure and uncomfortable going home.  Will plan observation admission.   Glynn Octave, MD 06/28/23 [phone]

## 2023-06-28 NOTE — Plan of Care (Signed)

## 2023-06-28 NOTE — Plan of Care (Signed)
  Problem: Education: Goal: Knowledge of General Education information will improve Description: Including pain rating scale, medication(s)/side effects and non-pharmacologic comfort measures Outcome: Progressing   Problem: Health Behavior/Discharge Planning: Goal: Ability to manage health-related needs will improve Outcome: Progressing   Problem: Clinical Measurements: Goal: Cardiovascular complication will be avoided Outcome: Progressing   Problem: Pain Managment: Goal: General experience of comfort will improve Outcome: Progressing   Problem: Safety: Goal: Ability to remain free from injury will improve Outcome: Progressing   

## 2023-06-28 NOTE — ED Notes (Signed)
[facility] here to get pt 

## 2023-06-28 NOTE — H&P (Signed)
History and Physical    Patient: Cynthia Kirk ZOX:[id number] DOB: September 01, 1951 DOA: 06/27/2023 DOS: the patient was seen and examined on 06/28/2023 PCP: Pincus Sanes, MD  Patient coming from: Home  Chief Complaint:  Chief Complaint  Patient presents with   Chest Pain   HPI: Cynthia Kirk is a 72 y.o. female with medical history significant of sarcoidosis, heart anxiety, hypertension, GERD, iron deficiency anemia, sleep apnea, diet-controlled type 2 diabetes who presented to the emergency department in [facility] for chest pain for the past 5 hours.  States that the chest pain radiated to her back, left neck and her left arm.  The pain has been intermittent somewhat since Saturday but then it started being constant yesterday.  She has had nausea for the past week but no vomiting.  Endorses headache and some shortness of breath.  Notes that she has been waking up at times wet.  Denies fever.  Has a slight nighttime cough.    Vitals in the ED showed hypertension otherwise normal.  Chest x-ray showed chronic bilateral upper lobe scarring and fibrotic changes no acute processes.  CT chest abdomen pelvis did not show an aortic dissection or aneurysm but noted groundglass and interstitial opacities in the upper lobes.  BMP showing sodium 134, calcium 8.7 otherwise normal.  CBC showing WBC 3.7, hemoglobin 11.4, hematocrit 34.9 otherwise normal.  Urinalysis was unremarkable.  Troponins were flat at 4, 4.  EKG showed normal sinus rhythm without acute ST or T wave changes.  Given IV fentanyl and Dilaudid in ED.  ED provider consulted the hospitalist service for evaluation for admission.   Review of Systems: As mentioned in the history of present illness. All other systems reviewed and are negative. Past Medical History:  Diagnosis Date   Abnormal Pap smear 2008   Adenomatous colon polyp    Allergic rhinitis, cause unspecified    Anxiety    ASCUS with positive high risk HPV    Blood transfusion 1976    Due to ectopic pregnancy   Chronic back pain    Ejection fraction    EF 45-50%, echo, October 27, 2011   Endometrial mass 2011   Esophageal reflux    Fibroid    H/O Clostridium difficile infection 03/2010   H/O menorrhagia 2011   Herpes simplex type 2 infection complicating pregnancy    Hiatal hernia    Iron deficiency anemia    Morbid obesity (HCC) 2010   Obstructive sleep apnea (adult) (pediatric)    cpap   Panic attacks    Plantar fasciitis    Pneumonia 2005; 2007; 2009; 2011   Sarcoidosis 1996   @ [facility]   Simple endometrial hyperplasia 2011   Spinal stenosis, lumbar    s/p decompression laminectomy 09/2013   Syncope    ?? Syncope ??   Type 2 diabetes, diet controlled (HCC)    Unspecified asthma(493.90)    Urinary incontinence, mixed 2011   VIN II (vulvar intraepithelial neoplasia II)    Past Surgical History:  Procedure Laterality Date   COLPOSCOPY VULVA W/ BIOPSY     "I've had maybe 3" (09/05/2013)   DILATION AND CURETTAGE OF UTERUS  2007   ECTOPIC PREGNANCY SURGERY  1976; ~ 1978   HYSTEROSCOPY DIAGNOSTIC  2011   LUMBAR LAMINECTOMY/DECOMPRESSION MICRODISCECTOMY N/A 09/05/2013   Procedure: Thoracic eleven-twelve Thoracic Laminectomy;  Surgeon: Tia Alert, MD;  Location: [facility];  Service: Neurosurgery;  Laterality: N/A;  Thoracic eleven-twelve Thoracic Laminectomy   MAXIMUM ACCESS (MAS)POSTERIOR  LUMBAR INTERBODY FUSION (PLIF) 1 LEVEL N/A 09/05/2013   Procedure: Lumbar four-five Maximum Access Surgery  Posterior lumbar interbody fusion;  Surgeon: Tia Alert, MD;  Location: [facility];  Service: Neurosurgery;  Laterality: N/A;  Lumbar four-five Maximum Access Surgery  Posterior lumbar interbody fusion   POSTERIOR LAMINECTOMY / DECOMPRESSION LUMBAR SPINE  09/05/2013   POSTERIOR LUMBAR FUSION  09/05/2013   THORACIC LAMINECTOMY  09/05/2013   URETHRAL SLING  2011   VULVECTOMY  ~ 2007   "for cancerous cells" (09/05/2013)   Social History:  reports that she has  never smoked. She has never used smokeless tobacco. She reports that she does not currently use alcohol. She reports that she does not use drugs.  Allergies  Allergen Reactions   Aspirin Hives, Shortness Of Breath and Other (See Comments)    Passed out   Ibuprofen Nausea And Vomiting and Other (See Comments)    Upsets stomach   Latex Rash    Family History  Problem Relation Age of Onset   Heart attack Father    Hypertension Father    Diabetes Father    Seizures Father    Migraines Father    Kidney disease Sister    Osteoporosis Sister    Heart attack Sister    Cancer Paternal Uncle    Asthma Daughter    Asthma Son    Osteoporosis Sister    Lung cancer Maternal Uncle    Kidney disease Other    Diabetes Other    High Cholesterol Brother        x2   Colon cancer Neg Hx    Colon polyps Neg Hx    Liver disease Neg Hx    Stomach cancer Neg Hx    Pancreatic cancer Neg Hx    Esophageal cancer Neg Hx     Prior to Admission medications   Medication Sig Start Date End Date Taking? Authorizing Provider  albuterol (PROVENTIL) (2.5 MG/3ML) 0.083% nebulizer solution TAKE 3ML(2.5MG ) BY NEBULIZATION EVERY 6 HOURS AS NEEDED FOR SHORTNESS OF BREATH 03/21/23   Coralyn Helling, MD  albuterol (VENTOLIN HFA) 108 (90 Base) MCG/ACT inhaler INHALE 2 PUFF&nbsp;&nbsp;BY MOUTH&nbsp;&nbsp;EVERY 6 HOUR as NEEDED FOR WHEEZING ,SHORTNESS OF BREATH 06/04/20   Coralyn Helling, MD  Azelastine HCl (ASTEPRO) 0.15 % SOLN Place 1 spray into the nose daily as needed. 03/21/23   Coralyn Helling, MD  famotidine (PEPCID) 40 MG tablet Take 1 tablet (40 mg total) by mouth 2 (two) times daily as needed for heartburn or indigestion. 03/15/23   Pincus Sanes, MD  hydrochlorothiazide (HYDRODIURIL) 12.5 MG tablet Take 1 tablet (12.5 mg total) by mouth daily. 03/15/23   Pincus Sanes, MD  hydrocortisone 2.5 % cream Apply topically 2 (two) times daily. 03/15/23   Pincus Sanes, MD  hydroquinone 4 % cream Apply 1 application topically 2  (two) times daily. 02/25/21   Pincus Sanes, MD  hydrOXYzine (ATARAX) 25 MG tablet Take 1 tablet (25 mg total) by mouth at bedtime as needed for itching. 03/15/23   Pincus Sanes, MD  ipratropium (ATROVENT) 0.06 % nasal spray Place 2 sprays into both nostrils 2 (two) times daily. 07/15/20   Burns, Bobette Mo, MD  montelukast (SINGULAIR) 10 MG tablet TAKE 1 TABLET BY MOUTH EVERYDAY AT BEDTIME 02/11/23   Coralyn Helling, MD  Olopatadine HCl 0.2 % SOLN Apply 1 drop to eye daily as needed (allergies). 03/21/23   Coralyn Helling, MD  OZEMPIC, 2 MG/DOSE, 8 MG/3ML SOPN DIAL  AND INJECT UNDER THE SKIN 2 MG WEEKLY 05/18/23   Pincus Sanes, MD  pantoprazole (PROTONIX) 40 MG tablet Take 1 tablet (40 mg total) by mouth daily. 03/15/23   Pincus Sanes, MD    Physical Exam: Vitals:   06/28/23 1100 06/28/23 1130 06/28/23 1200 06/28/23 1352  BP: 132/76 134/80 96/60 (!) 152/92  Pulse: 74  74 79  Resp: 14 13 16 15   Temp:    98 F (36.7 C)  TempSrc:    Oral  SpO2: (!) 85%  100% 100%  Weight:    52.1 kg  Height:    5\' 4"  (1.626 m)   GEN:     alert, well appearing older female and no distress    HENT:  mucus membranes moist, oropharyngeal without lesions or erythema,  nares patent, no nasal discharge  EYES:   pupils equal and reactive, EOM intact NECK:  supple, normal ROM RESP:  clear to auscultation bilaterally, no increased work of breathing  CVS:   regular rate and rhythm, no murmur, distal pulses intact   ABD:  soft, non-tender; bowel sounds present; no palpable masses EXT:   normal ROM, atraumatic, no edema  NEURO:  normal without focal findings,  speech normal, alert and oriented   Skin:   warm and dry, normal skin turgor Psych: Normal affect, appropriate speech and behavior   Data Reviewed:  Relevant notes from primary care and specialist visits, past discharge summaries as available in EHR, including Care Everywhere. Prior diagnostic testing as pertinent to current admission diagnoses Updated medications  and problem lists for reconciliation ED course, including vitals, labs, imaging, treatment and response to treatment Triage notes, nursing and pharmacy notes and ED provider's notes Notable results as noted in HPI  Assessment and Plan: Principal Problem:   Chest pain Active Problems:   Pulmonary sarcoidosis (HCC)   Diabetes mellitus (HCC)   GERD   Anxiety   Normocytic anemia   ACS Rule out  Chest pain Patient with ongoing chest pain with negative CTA chest abdomen pelvis and chest x-ray left-sided pleural findings related to sarcoidosis.  EKG and troponins are reassuring.  I do not see a recent cath on chart review. Etiology of her chest pain unclear at this time. Will observe for further work up and cardiology consult.  -Place in observation on cardiac telemetry -Cardiology consulted, appreciate recommendations -  Follow up ECHO, restratification labs -  AM CBC, BMP  -  AM EKG  -  Has allergy to aspirin  - Obtain lipid panel  -  heart healthy diet modified  -  Lovenox VTE ppx - Cardiopulmonary monitoring  - obtain UDS - Does not take a statin though previously recommended   GERD -Continue Protonix daily and Pepcid as needed  Sarcoidosis Follows with pulmonology.  CTA chest abdomen pelvis showing fibrotic lung disease. -Albuterol nebulizers as needed  History of hypertension -Uses hydrochlorothiazide 21.5 mg PRN  Pruritus and anxiety -Hydroxyzine 25 mg at bedtime.  Normocytic anemia Hemoglobin 11.4 on admission.  Has history of iron deficiency anemia. -Trend Hgb with CBC -Follow-up with PCP to discuss colonoscopy outpatient.    Diet controlled type 2 diabetes -Monitor CBG once BMP -Ozempic 2 mg injection due soon, husband to bring in    Advance Care Planning:   Code Status: Full Code   Consults: Cardiology   Family Communication: None  Severity of Illness: The appropriate patient status for this patient is OBSERVATION. Observation status is judged to be  reasonable  and necessary in order to provide the required intensity of service to ensure the patient's safety. The patient's presenting symptoms, physical exam findings, and initial radiographic and laboratory data in the context of their medical condition is felt to place them at decreased risk for further clinical deterioration. Furthermore, it is anticipated that the patient will be medically stable for discharge from the hospital within 2 midnights of admission.   Author: Katha Cabal, DO 06/28/2023 4:21 PM  For on call review [email or web address].

## 2023-06-28 NOTE — ED Notes (Signed)
Called [facility] for transport to [facility] @ 11:59am.    Spoke with Jenel Lucks.

## 2023-06-29 ENCOUNTER — Observation Stay (HOSPITAL_COMMUNITY): Payer: Medicare Other

## 2023-06-29 ENCOUNTER — Other Ambulatory Visit: Payer: Self-pay | Admitting: Cardiology

## 2023-06-29 ENCOUNTER — Other Ambulatory Visit: Payer: Self-pay

## 2023-06-29 DIAGNOSIS — D649 Anemia, unspecified: Secondary | ICD-10-CM

## 2023-06-29 DIAGNOSIS — R0789 Other chest pain: Secondary | ICD-10-CM

## 2023-06-29 DIAGNOSIS — E119 Type 2 diabetes mellitus without complications: Secondary | ICD-10-CM | POA: Diagnosis not present

## 2023-06-29 DIAGNOSIS — I10 Essential (primary) hypertension: Secondary | ICD-10-CM | POA: Diagnosis not present

## 2023-06-29 DIAGNOSIS — E1169 Type 2 diabetes mellitus with other specified complication: Secondary | ICD-10-CM | POA: Diagnosis not present

## 2023-06-29 DIAGNOSIS — R079 Chest pain, unspecified: Secondary | ICD-10-CM

## 2023-06-29 DIAGNOSIS — D860 Sarcoidosis of lung: Secondary | ICD-10-CM

## 2023-06-29 DIAGNOSIS — K219 Gastro-esophageal reflux disease without esophagitis: Secondary | ICD-10-CM

## 2023-06-29 DIAGNOSIS — F419 Anxiety disorder, unspecified: Secondary | ICD-10-CM | POA: Diagnosis not present

## 2023-06-29 DIAGNOSIS — R072 Precordial pain: Secondary | ICD-10-CM | POA: Diagnosis not present

## 2023-06-29 DIAGNOSIS — Z91040 Latex allergy status: Secondary | ICD-10-CM | POA: Diagnosis not present

## 2023-06-29 LAB — ECHOCARDIOGRAM COMPLETE
AV Mean grad: 5 mmHg
AV Peak grad: 9.5 mmHg
Ao pk vel: 1.54 m/s
Area-P 1/2: 2.66 cm2
Height: 64 in
S' Lateral: 3.3 cm
Weight: 1837.75 oz

## 2023-06-29 LAB — COMPREHENSIVE METABOLIC PANEL
ALT: 11 U/L (ref 0–44)
AST: 19 U/L (ref 15–41)
Albumin: 2.9 g/dL — ABNORMAL LOW (ref 3.5–5.0)
Alkaline Phosphatase: 85 U/L (ref 38–126)
Anion gap: 13 (ref 5–15)
BUN: 17 mg/dL (ref 8–23)
CO2: 26 mmol/L (ref 22–32)
Calcium: 8.6 mg/dL — ABNORMAL LOW (ref 8.9–10.3)
Chloride: 100 mmol/L (ref 98–111)
Creatinine, Ser: 1.02 mg/dL — ABNORMAL HIGH (ref 0.44–1.00)
GFR, Estimated: 58 mL/min — ABNORMAL LOW (ref >60)
Glucose, Bld: 95 mg/dL (ref 70–99)
Potassium: 3.8 mmol/L (ref 3.5–5.1)
Sodium: 139 mmol/L (ref 135–145)
Total Bilirubin: 0.3 mg/dL (ref 0.3–1.2)
Total Protein: 5.5 g/dL — ABNORMAL LOW (ref 6.5–8.1)

## 2023-06-29 LAB — CBC
HCT: 33.3 % — ABNORMAL LOW (ref 36.0–46.0)
Hemoglobin: 11.1 g/dL — ABNORMAL LOW (ref 12.0–15.0)
MCH: 30 pg (ref 26.0–34.0)
MCHC: 33.3 g/dL (ref 30.0–36.0)
MCV: 90 fL (ref 80.0–100.0)
Platelets: 191 10*3/uL (ref 150–400)
RBC: 3.7 MIL/uL — ABNORMAL LOW (ref 3.87–5.11)
RDW: 13.1 % (ref 11.5–15.5)
WBC: 3.9 10*3/uL — ABNORMAL LOW (ref 4.0–10.5)
nRBC: 0 % (ref 0.0–0.2)

## 2023-06-29 MED ORDER — BACLOFEN 10 MG PO TABS: 10.0000 mg | ORAL_TABLET | Freq: Three times a day (TID) | ORAL | 0 refills | Status: AC | PRN

## 2023-06-29 MED ORDER — PANTOPRAZOLE SODIUM 40 MG PO TBEC
40.0000 mg | DELAYED_RELEASE_TABLET | Freq: Every day | ORAL | Status: DC
  Administered 2023-06-29: 40 mg via ORAL
  Filled 2023-06-29: qty 1

## 2023-06-29 MED ORDER — MUSCLE RUB 10-15 % EX CREA: 1.0000 | TOPICAL_CREAM | CUTANEOUS | Status: AC | PRN

## 2023-06-29 MED ORDER — FAMOTIDINE 20 MG PO TABS
40.0000 mg | ORAL_TABLET | Freq: Two times a day (BID) | ORAL | Status: DC | PRN
  Administered 2023-06-29: 40 mg via ORAL
  Filled 2023-06-29: qty 2

## 2023-06-29 NOTE — Care Management Obs Status (Signed)
MEDICARE OBSERVATION STATUS NOTIFICATION   Patient Details  Name: Cynthia Kirk MRN: [id number] Date of Birth: [birth year]   Medicare Observation Status Notification Given:  Yes    Gala Lewandowsky, RN 06/29/2023, 5:19 PM

## 2023-06-29 NOTE — Progress Notes (Signed)
   Ordered nuclear stress test and messaged our office to schedule as an outpatient. Patient has a follow up appointment with Robin Searing NP on 8/7. Informed Consent   Shared Decision Making/Informed Consent The risks [chest pain, shortness of breath, cardiac arrhythmias, dizziness, blood pressure fluctuations, myocardial infarction, stroke/transient ischemic attack, nausea, vomiting, allergic reaction, radiation exposure, metallic taste sensation and life-threatening complications (estimated to be 1 in 10,000)], benefits (risk stratification, diagnosing coronary artery disease, treatment guidance) and alternatives of a nuclear stress test were discussed in detail with Ms. Antu and she agrees to proceed.       Jonita Albee, PA-C 06/29/2023 11:41 AM

## 2023-06-29 NOTE — TOC CM/SW Note (Addendum)
Transition of Care [facility]) - Inpatient Brief Assessment   Patient Details  Name: Cynthia Kirk MRN: [id number] Date of Birth: Jul 05, 1951  Transition of [facility]) CM/SW Contact:    Gala Lewandowsky, RN Phone Number: 06/29/2023, 5:28 PM   Clinical Narrative: Patient presented for chest pain. Patient called [facility] to appeal discharge. Patient is unable to appeal discharge since she is observation status. No further needs identified at this time.  Patient states she will call [employer] to cancel the appeal.  Transition of Care Asessment: Insurance and Status: Insurance coverage has been reviewed Patient has primary care physician: Yes Home environment has been reviewed: reviewed  Prior/Current Home Services: No current home services Social Determinants of Health Reivew: SDOH reviewed no interventions necessary Readmission risk has been reviewed: Yes Transition of care needs: no transition of care needs at this time

## 2023-06-29 NOTE — Discharge Summary (Signed)
Physician Discharge Summary  [name] WUJ:[id number] DOB: [birth year] DOA: 06/27/2023  PCP: Pincus Sanes, MD  Admit date: 06/27/2023 Discharge date: 06/29/2023  Admitted From: Home  Discharge disposition: Home  Recommendations for Outpatient Follow-Up:   Follow up with your primary care provider in one week.  Check CBC, BMP, magnesium in the next visit Follow-up with cardiology on 8/ 7.Cardiology has recommended outpatient stress test to be arranged at cardiology office.  Discharge Diagnosis:   Principal Problem:   Chest pain at rest Active Problems:   Pulmonary sarcoidosis (HCC)   Diabetes mellitus (HCC)   GERD   Anxiety   Normocytic anemia  Discharge Condition: Improved.  Diet recommendation: Low sodium, heart healthy.  Carbohydrate-modified.    Wound care: None.  Code status: Full.   History of Present Illness:   Cynthia Kirk is a 72 y.o. female with medical history significant of sarcoidosis, anxiety, hypertension, GERD, iron deficiency anemia, sleep apnea, diet-controlled type 2 diabetes presented to hospital with chest pain 5 hours prior to presentation with radiation to the back neck and left arm.  Pain had been intermittent since Saturday with cessation of nausea, mild shortness of breath, but no vomiting.  In the ED, patient had elevated blood pressure.  Chest x-ray showed chronic bilateral upper lobe scarring and fibrotic changes no acute processes.  CT chest abdomen pelvis did not show an aortic dissection or aneurysm but noted groundglass and interstitial opacities in the upper lobes.  BMP showed a mild hyponatremia at sodium 134 otherwise normal.  CBC showing WBC 3.7, hemoglobin 11.4. Urinalysis was unremarkable.  Troponins were flat at 4, 4.  EKG showed normal sinus rhythm without acute ST or T wave changes.  Patient was then admitted hospital for further evaluation and treatment. [facility] Course:   Following conditions were addressed during  hospitalization as listed below,  Chest pain.  Likely atypical. History of intermittent chest pain nausea dyspnea.  Reproducible chest pain likely musculoskeletal, no EKG changes or troponin elevation.  Negative CTA of the chest abdomen pelvis.  Chest x-ray with findings of sarcoidosis.  Allergic to aspirin.  TSH of 0.8.  Lipid panel with LDL of 113.  Hemoglobin A1c of 4.8.    2D echocardiogram showed normal LV function with no regional wall motion abnormality. Cardiology was consulted.  Cardiology recommended outpatient stress test patient will follow-up with cardiology if this is more than 24.  GERD with Continue Protonix daily and Pepcid as needed at home.  Will continue on discharge.   Sarcoidosis Follows with pulmonology as outpatient.  CTA chest abdomen pelvis showing fibrotic lung disease.  Patient will continue inhalers and nebulizers from home.   History of hypertension Patient is on hydrochlorothiazide 21.5 mg PRN at home.  Currently blood pressure stable.   Pruritus and anxiety Patient is hydroxyzine 25 mg at bedtime.   Normocytic anemia, history of iron deficiency anemia. Will need to follow-up with PCP to discuss about colonoscopy.  Latest hemoglobin of 11.1.   Diet controlled type 2 diabetes -Has lost significant weight.  On Ozempic as outpatient.   Disposition.  At this time, patient is stable for disposition home with outpatient PCP, pulmonary, cardiology follow-up.  Medical Consultants:   Cardiology  Procedures:    None Subjective:   Today, patient was seen and examined at bedside complains of neck discomfort chest wall discomfort.  Reproducible pain.  Discharge Exam:   Vitals:   06/29/23 0302 06/29/23 1225  BP: 138/86   Pulse: 84  75  Resp: 13   Temp: 98.3 F (36.8 C)   SpO2: 99%    Vitals:   06/28/23 1943 06/28/23 2342 06/29/23 0302 06/29/23 1225  BP: 100/89 127/66 138/86   Pulse: 80 82 84 75  Resp: 18 17 13    Temp: 98.1 F (36.7 C) 98.4 F  (36.9 C) 98.3 F (36.8 C)   TempSrc: Oral Oral Oral Oral  SpO2: 99% 99% 99%   Weight:      Height:       Body mass index is 19.72 kg/m.  General: Alert awake, not in obvious distress, anxious,  HENT: pupils equally reacting to light,  No scleral pallor or icterus noted. Oral mucosa is moist.  Chest:  Clear breath sounds. . No crackles or wheezes.  Tenderness on palpation of the anterior chest wall. CVS: S1 &S2 heard. No murmur.  Regular rate and rhythm. Abdomen: Soft, nontender, nondistended.  Bowel sounds are heard.   Extremities: No cyanosis, clubbing or edema.  Peripheral pulses are palpable. Psych: Alert, awake and oriented, mildly anxious CNS:  No cranial nerve deficits.  Power equal in all extremities.   Skin: Warm and dry.  No rashes noted.  The results of significant diagnostics from this hospitalization (including imaging, microbiology, ancillary and laboratory) are listed below for reference.     Diagnostic Studies:   CT Angio Chest/Abd/Pel for Dissection W and/or Wo Contrast  Result Date: 06/27/2023 CLINICAL DATA:  Acute aortic syndrome suspected EXAM: CT ANGIOGRAPHY CHEST, ABDOMEN AND PELVIS TECHNIQUE: Non-contrast CT of the chest was initially obtained. Multidetector CT imaging through the chest, abdomen and pelvis was performed using the standard protocol during bolus administration of intravenous contrast. Multiplanar reconstructed images and MIPs were obtained and reviewed to evaluate the vascular anatomy. RADIATION DOSE REDUCTION: This exam was performed according to the departmental dose-optimization program which includes automated exposure control, adjustment of the mA and/or kV according to patient size and/or use of iterative reconstruction technique. CONTRAST:  OMNIPAQUE IOHEXOL 350 MG/ML SOLN COMPARISON:  Chest CT 02/09/2022. CT abdomen and pelvis 03/20/2013 FINDINGS: CTA CHEST FINDINGS Cardiovascular: Preferential opacification of the thoracic aorta. No  evidence of thoracic aortic aneurysm or dissection. Normal heart size. No pericardial effusion. There is mild calcified atherosclerotic disease throughout the aorta. Mediastinum/Nodes: No enlarged mediastinal, hilar, or axillary lymph nodes. Thyroid gland, trachea, and esophagus demonstrate no significant findings. Lungs/Pleura: Ground-glass and interstitial opacities in the bilateral upper lungs appear unchanged from the prior examination. There is no new focal lung infiltrate, pleural effusion or pneumothorax. Musculoskeletal: No chest wall abnormality. No acute or significant osseous findings. Review of the MIP images confirms the above findings. CTA ABDOMEN AND PELVIS FINDINGS VASCULAR Aorta: Normal caliber aorta without aneurysm, dissection, vasculitis or significant stenosis. Celiac: Patent without evidence of aneurysm, dissection, vasculitis or significant stenosis. SMA: Patent without evidence of aneurysm, dissection, vasculitis or significant stenosis. Renals: Both renal arteries are patent without evidence of aneurysm, dissection, vasculitis, fibromuscular dysplasia or significant stenosis. IMA: Patent without evidence of aneurysm, dissection, vasculitis or significant stenosis. Inflow: Patent without evidence of aneurysm, dissection, vasculitis or significant stenosis. Veins: No obvious venous abnormality within the limitations of this arterial phase study. Review of the MIP images confirms the above findings. NON-VASCULAR Hepatobiliary: No focal liver abnormality is seen. No gallstones, gallbladder wall thickening, or biliary dilatation. Pancreas: Unremarkable. No pancreatic ductal dilatation or surrounding inflammatory changes. Spleen: Normal in size without focal abnormality. Adrenals/Urinary Tract: There is diffuse bladder wall thickening versus normal under distension.  Bilateral renal cysts are present measuring 2.7 cm on the right and 1.7 cm on left. Adrenal glands are within normal limits.  Stomach/Bowel: Stomach is within normal limits. No evidence of bowel wall thickening, distention, or inflammatory changes. The appendix is not seen. Lymphatic: No enlarged lymph nodes are identified. Reproductive: Uterus and bilateral adnexa are unremarkable. Other: There is a small umbilical hernia containing nondilated bowel. There is no ascites. Musculoskeletal: L4-5 fusion hardware is present. Review of the MIP images confirms the above findings. IMPRESSION: 1. No evidence for aortic dissection or aneurysm. 2. Diffuse bladder wall thickening versus normal under distension. Correlate clinically for cystitis. 3. Stable ground-glass and interstitial opacities in the upper lungs compatible with fibrotic interstitial lung disease. 4. Small umbilical hernia containing nondilated bowel. Electronically Signed   By: Darliss Cheney M.D.   On: 06/27/2023 23:09   DG Chest 2 View  Result Date: 06/27/2023 CLINICAL DATA:  Chest pain. EXAM: CHEST - 2 VIEW COMPARISON:  January 05, 2023 FINDINGS: The heart size and mediastinal contours are within normal limits. Stable, chronic moderate severity bilateral upper lobe scarring and fibrotic changes are seen. There is no evidence of an acute infiltrate, pleural effusion or pneumothorax. Multilevel degenerative changes are seen throughout the thoracic spine. IMPRESSION: Chronic bilateral upper lobe scarring and fibrotic changes without evidence of acute or active cardiopulmonary disease. Electronically Signed   By: Aram Candela M.D.   On: 06/27/2023 17:55     Labs:   Basic Metabolic Panel: Recent Labs  Lab 06/27/23 1713 06/29/23 0238  NA 134* 139  K 3.5 3.8  CL 102 100  CO2 25 26  GLUCOSE 95 95  BUN 13 17  CREATININE 0.90 1.02*  CALCIUM 8.7* 8.6*   GFR Estimated Creatinine Clearance: 41 mL/min (A) (by C-G formula based on SCr of 1.02 mg/dL (H)). Liver Function Tests: Recent Labs  Lab 06/29/23 0238  AST 19  ALT 11  ALKPHOS 85  BILITOT 0.3  PROT 5.5*   ALBUMIN 2.9*   No results for input(s): "LIPASE", "AMYLASE" in the last 168 hours. No results for input(s): "AMMONIA" in the last 168 hours. Coagulation profile No results for input(s): "INR", "PROTIME" in the last 168 hours.  CBC: Recent Labs  Lab 06/27/23 2258 06/29/23 0238  WBC 3.7* 3.9*  HGB 11.4* 11.1*  HCT 34.9* 33.3*  MCV 89.3 90.0  PLT 188 191   Cardiac Enzymes: No results for input(s): "CKTOTAL", "CKMB", "CKMBINDEX", "TROPONINI" in the last 168 hours. BNP: Invalid input(s): "POCBNP" CBG: No results for input(s): "GLUCAP" in the last 168 hours. D-Dimer No results for input(s): "DDIMER" in the last 72 hours. Hgb A1c Recent Labs    06/28/23 1846  HGBA1C 4.8   Lipid Profile Recent Labs    06/28/23 1846  CHOL 157  HDL 62  LDLCALC 88  TRIG 33  CHOLHDL 2.5   Thyroid function studies Recent Labs    06/28/23 1846  TSH 0.819   Anemia work up No results for input(s): "VITAMINB12", "FOLATE", "FERRITIN", "TIBC", "IRON", "RETICCTPCT" in the last 72 hours. Microbiology No results found for this or any previous visit (from the past 240 hour(s)).   Discharge Instructions:   Discharge Instructions     Diet - low sodium heart healthy   Complete by: As directed    Discharge instructions   Complete by: As directed    Follow-up with your primary care provider in 1 week.  Apply warm compression to the chest wall and neck 2-3 times  a day.    Apply analgesic cream.  Discuss with your primary care physician if you continue to have neck and chest wall pain.  Seek medical attention for worsening symptoms   Increase activity slowly   Complete by: As directed       Allergies as of 06/29/2023       Reactions   Aspirin Hives, Shortness Of Breath, Other (See Comments)   Passed out   Ibuprofen Nausea And Vomiting, Other (See Comments)   Upsets stomach   Latex Rash        Medication List     STOP taking these medications    Azelastine HCl 0.15 %  Soln Commonly known as: Astepro       TAKE these medications    albuterol 108 (90 Base) MCG/ACT inhaler Commonly known as: Ventolin HFA INHALE 2 PUFF&nbsp;&nbsp;BY MOUTH&nbsp;&nbsp;EVERY 6 HOUR as NEEDED FOR WHEEZING ,SHORTNESS OF BREATH What changed:  how much to take how to take this when to take this reasons to take this additional instructions   albuterol (2.5 MG/3ML) 0.083% nebulizer solution Commonly known as: PROVENTIL TAKE 3ML(2.5MG ) BY NEBULIZATION EVERY 6 HOURS AS NEEDED FOR SHORTNESS OF BREATH What changed:  how much to take how to take this when to take this reasons to take this additional instructions   baclofen 10 MG tablet Commonly known as: LIORESAL Take 1 tablet (10 mg total) by mouth 3 (three) times daily as needed for up to 5 days for muscle spasms.   famotidine 40 MG tablet Commonly known as: PEPCID Take 1 tablet (40 mg total) by mouth 2 (two) times daily as needed for heartburn or indigestion.   hydrochlorothiazide 12.5 MG tablet Commonly known as: HYDRODIURIL Take 1 tablet (12.5 mg total) by mouth daily.   hydrocortisone 2.5 % cream Apply topically 2 (two) times daily.   hydroquinone 4 % cream Apply 1 application topically 2 (two) times daily. What changed:  when to take this reasons to take this   hydrOXYzine 25 MG tablet Commonly known as: ATARAX Take 1 tablet (25 mg total) by mouth at bedtime as needed for itching.   ipratropium 0.06 % nasal spray Commonly known as: ATROVENT Place 2 sprays into both nostrils 2 (two) times daily.   montelukast 10 MG tablet Commonly known as: SINGULAIR TAKE 1 TABLET BY MOUTH EVERYDAY AT BEDTIME What changed:  how much to take how to take this when to take this additional instructions   Muscle Rub 10-15 % Crea Apply 1 Application topically as needed for muscle pain.   Olopatadine HCl 0.2 % Soln Apply 1 drop to eye daily as needed (allergies). What changed: how to take this   Ozempic (2  MG/DOSE) 8 MG/3ML Sopn Generic drug: Semaglutide (2 MG/DOSE) DIAL AND INJECT UNDER THE SKIN 2 MG WEEKLY What changed: See the new instructions.   pantoprazole 40 MG tablet Commonly known as: PROTONIX Take 1 tablet (40 mg total) by mouth daily.          Time coordinating discharge: 39 minutes  Signed:  Mamadou Breon  [facility] 06/29/2023, 1:45 PM

## 2023-06-29 NOTE — Progress Notes (Signed)
Patient refuses to be discharged because she felt that we did not address all her issues. Patient request for her neck to be assessed due to that area is what is causing her pain. Notified MD, charge nurse and case management. New order being placed.

## 2023-06-30 ENCOUNTER — Telehealth: Payer: Self-pay

## 2023-06-30 NOTE — Transitions of Care (Post Inpatient/ED Visit) (Signed)
06/30/2023  Name: Cynthia Kirk MRN: [id number] DOB: 06-16-1951  Today's TOC FU Call Status: Today's TOC FU Call Status:: Successful TOC FU Call Competed TOC FU Call Complete Date: 06/30/23  Transition Care Management Follow-up Telephone Call Date of Discharge: 06/29/23 Discharge Facility: [facility]) Type of Discharge: Inpatient Admission Primary Inpatient Discharge Diagnosis:: Chest pain at rest How have you been since you were released from the hospital?: (S) Same (pt states her neck pain is bothering her all the time and causing the chest pain and left arm pain) Any questions or concerns?: No  Items Reviewed: Did you receive and understand the discharge instructions provided?: Yes Medications obtained,verified, and reconciled?: Yes (Medications Reviewed) Any new allergies since your discharge?: No Dietary orders reviewed?: Yes Do you have support at home?: Yes  Medications Reviewed Today: Medications Reviewed Today     Reviewed by Merleen Nicely, LPN (Licensed Practical Nurse) on 06/30/23 at [phone]  Med List Status: <None>   Medication Order Taking? Sig Documenting Provider Last Dose Status Informant  albuterol (PROVENTIL) (2.5 MG/3ML) 0.083% nebulizer solution [id number] Yes TAKE 3ML(2.5MG ) BY NEBULIZATION EVERY 6 HOURS AS NEEDED FOR SHORTNESS OF BREATH  Patient taking differently: Take 2.5 mg by nebulization every 6 (six) hours as needed for wheezing or shortness of breath.   Coralyn Helling, MD Taking Active Self  albuterol (VENTOLIN HFA) 108 (90 Base) MCG/ACT inhaler [id number] Yes INHALE 2 PUFF&nbsp;&nbsp;BY MOUTH&nbsp;&nbsp;EVERY 6 HOUR as NEEDED FOR WHEEZING ,SHORTNESS OF BREATH  Patient taking differently: Inhale 2 puffs into the lungs every 6 (six) hours as needed for wheezing or shortness of breath. INHALE 2 PUFF  BY MOUTH  EVERY 6 HOUR as NEEDED FOR WHEEZING ,SHORTNESS OF Benny Lennert, MD Taking Active Self  baclofen (LIORESAL) 10 MG tablet [id number] Yes  Take 1 tablet (10 mg total) by mouth 3 (three) times daily as needed for up to 5 days for muscle spasms. Pokhrel, Rebekah Chesterfield, MD Taking Active   famotidine (PEPCID) 40 MG tablet [id number] Yes Take 1 tablet (40 mg total) by mouth 2 (two) times daily as needed for heartburn or indigestion. Pincus Sanes, MD Taking Active Self  hydrochlorothiazide (HYDRODIURIL) 12.5 MG tablet [id number] Yes Take 1 tablet (12.5 mg total) by mouth daily. Pincus Sanes, MD Taking Active Self           Med Note Jomarie Longs, [place]   Tue Jun 28, 2023  3:40 PM) No record in pharmacy  hydrocortisone 2.5 % cream [id number] Yes Apply topically 2 (two) times daily. Pincus Sanes, MD Taking Active Self  hydroquinone 4 % cream [id number] Yes Apply 1 application topically 2 (two) times daily.  Patient taking differently: Apply 1 application  topically daily as needed (itching).   Pincus Sanes, MD Taking Active Self  hydrOXYzine (ATARAX) 25 MG tablet [id number] Yes Take 1 tablet (25 mg total) by mouth at bedtime as needed for itching. Pincus Sanes, MD Taking Active Self  ipratropium (ATROVENT) 0.06 % nasal spray [id number] Yes Place 2 sprays into both nostrils 2 (two) times daily. Pincus Sanes, MD Taking Active Self  Menthol-Methyl Salicylate (MUSCLE RUB) 10-15 % CREA [id number] Yes Apply 1 Application topically as needed for muscle pain. Pokhrel, [place], MD Taking Active   montelukast (SINGULAIR) 10 MG tablet [id number] Yes TAKE 1 TABLET BY MOUTH EVERYDAY AT BEDTIME  Patient taking differently: Take 10 mg by mouth at bedtime.   Coralyn Helling, MD Taking Active Self  Olopatadine HCl 0.2 % SOLN  [id number] Yes Apply 1 drop to eye daily as needed (allergies).  Patient taking differently: Place 1 drop into both eyes daily as needed (allergies).   Coralyn Helling, MD Taking Active Self  OZEMPIC, 2 MG/DOSE, 8 MG/3ML SOPN [id number] Yes DIAL AND INJECT UNDER THE SKIN 2 MG WEEKLY  Patient taking differently: Inject 2 mg into the skin once a week.  Tuesday   Pincus Sanes, MD Taking Active Self  pantoprazole (PROTONIX) 40 MG tablet [id number] Yes Take 1 tablet (40 mg total) by mouth daily. Pincus Sanes, MD Taking Active Self            Home Care and Equipment/Supplies: Were Home Health Services Ordered?: No Any new equipment or medical supplies ordered?: No  Functional Questionnaire: Do you need assistance with bathing/showering or dressing?: No Do you need assistance with meal preparation?: No Do you need assistance with eating?: No Do you have difficulty maintaining continence: No Do you need assistance with getting out of bed/getting out of a chair/moving?: No Do you have difficulty managing or taking your medications?: No  Follow up appointments reviewed: PCP Follow-up appointment confirmed?: (S) No (pt needs appt soon for hosp fu because pain is not better and needs referral, nothing available until 07-14-22 - will ask front desk to call pt to schedule fu ASAP) MD Provider Line Number:[phone] Given: Yes Follow-up Provider: [facility] Follow-up appointment confirmed?: No Do you need transportation to your follow-up appointment?: No Do you understand care options if your condition(s) worsen?: Yes-patient verbalized understanding    SIGNATURE  Woodfin Ganja LPN [facility] Nurse Health Advisor Direct Dial [phone]

## 2023-07-08 ENCOUNTER — Encounter (INDEPENDENT_AMBULATORY_CARE_PROVIDER_SITE_OTHER): Payer: Medicare Other | Admitting: Ophthalmology

## 2023-07-11 NOTE — Progress Notes (Signed)
[facility] - Clinic Note  07/19/2023   CHIEF COMPLAINT Patient presents for Retina Evaluation  HISTORY OF PRESENT ILLNESS: Cynthia Kirk is a 72 y.o. female who presents to the clinic today for:  HPI     Retina Evaluation   In both eyes.  This started 18 months ago.  Duration of 3 months.  Associated Symptoms Flashes and Floaters.  Response to treatment was no improvement.  I, the attending physician,  performed the HPI with the patient and updated documentation appropriately.        Comments   Pt here for diabetic ret eval, Dr. Lawerance Kirk referred. Pt states that over the past 18 months she's noticed floaters and FOL (told it was PVD OU). Recently VA has gotten a bit blurrier. Diagnosed as diabetic in 1998. Has hx of HTN. A1C in July 4.8       Last edited by Cynthia Chris, MD on 07/19/2023  6:28 PM.    Pt is here on the referral of her PCP, Cynthia Kirk, for DM exam, pts last A1c was 4.8 in July, pt states last year she was dx with a PVD OU, she states she has been told she has cataracts and is a candidate for glaucoma, pt is on Ozempic and she states that has helped lower her A1c, she is only taking drops for allergies   Referring physician: Pincus Sanes, MD [address] [place],  [place] [address]  HISTORICAL INFORMATION:  Selected notes from the MEDICAL RECORD NUMBER Referred by Dr. Cheryll Kirk for DM exam LEE:  Ocular Hx- PMH-   CURRENT MEDICATIONS: Current Outpatient Medications (Ophthalmic Drugs)  Medication Sig   Olopatadine HCl 0.2 % SOLN Apply 1 drop to eye daily as needed (allergies). (Patient taking differently: Place 1 drop into both eyes daily as needed (allergies).)   No current facility-administered medications for this visit. (Ophthalmic Drugs)   Current Outpatient Medications (Other)  Medication Sig   albuterol (PROVENTIL) (2.5 MG/3ML) 0.083% nebulizer solution TAKE 3ML(2.5MG ) BY NEBULIZATION EVERY 6 HOURS AS NEEDED FOR SHORTNESS OF  BREATH (Patient taking differently: Take 2.5 mg by nebulization every 6 (six) hours as needed for wheezing or shortness of breath.)   albuterol (VENTOLIN HFA) 108 (90 Base) MCG/ACT inhaler INHALE 2 PUFF&nbsp;&nbsp;BY MOUTH&nbsp;&nbsp;EVERY 6 HOUR as NEEDED FOR WHEEZING ,SHORTNESS OF BREATH (Patient taking differently: Inhale 2 puffs into the lungs every 6 (six) hours as needed for wheezing or shortness of breath. INHALE 2 PUFF  BY MOUTH  EVERY 6 HOUR as NEEDED FOR WHEEZING ,SHORTNESS OF BREATH)   famotidine (PEPCID) 40 MG tablet Take 1 tablet (40 mg total) by mouth 2 (two) times daily as needed for heartburn or indigestion.   hydrochlorothiazide (HYDRODIURIL) 12.5 MG tablet Take 1 tablet (12.5 mg total) by mouth daily.   hydrocortisone 2.5 % cream Apply topically 2 (two) times daily.   hydroquinone 4 % cream Apply 1 application topically 2 (two) times daily. (Patient taking differently: Apply 1 application  topically daily as needed (itching).)   hydrOXYzine (ATARAX) 25 MG tablet Take 1 tablet (25 mg total) by mouth at bedtime as needed for itching.   ipratropium (ATROVENT) 0.06 % nasal spray Place 2 sprays into both nostrils 2 (two) times daily.   Menthol-Methyl Salicylate (MUSCLE RUB) 10-15 % CREA Apply 1 Application topically as needed for muscle pain.   montelukast (SINGULAIR) 10 MG tablet TAKE 1 TABLET BY MOUTH EVERYDAY AT BEDTIME (Patient taking differently: Take 10 mg by mouth  at bedtime.)   OZEMPIC, 2 MG/DOSE, 8 MG/3ML SOPN DIAL AND INJECT UNDER THE SKIN 2 MG WEEKLY (Patient taking differently: Inject 2 mg into the skin once a week. Tuesday)   pantoprazole (PROTONIX) 40 MG tablet Take 1 tablet (40 mg total) by mouth daily.   No current facility-administered medications for this visit. (Other)   REVIEW OF SYSTEMS: ROS   Positive for: HENT, Endocrine, Cardiovascular, Eyes Negative for: Constitutional, Gastrointestinal, Neurological, Skin, Genitourinary, Musculoskeletal, Respiratory,  Psychiatric, Allergic/Imm, Heme/Lymph Last edited by Cynthia Kirk, COT on 07/19/2023  1:08 PM.     ALLERGIES Allergies  Allergen Reactions   Aspirin Hives, Shortness Of Breath and Other (See Comments)    Passed out   Ibuprofen Nausea And Vomiting and Other (See Comments)    Upsets stomach   Latex Rash   PAST MEDICAL HISTORY Past Medical History:  Diagnosis Date   Abnormal Pap smear 2008   Adenomatous colon polyp    Allergic rhinitis, cause unspecified    Anxiety    ASCUS with positive high risk HPV    Blood transfusion 1976   Due to ectopic pregnancy   Chronic back pain    Ejection fraction    EF 45-50%, echo, October 27, 2011   Endometrial mass 2011   Esophageal reflux    Fibroid    H/O Clostridium difficile infection 03/2010   H/O menorrhagia 2011   Herpes simplex type 2 infection complicating pregnancy    Hiatal hernia    Iron deficiency anemia    Morbid obesity (HCC) 2010   Obstructive sleep apnea (adult) (pediatric)    cpap   Panic attacks    Plantar fasciitis    Pneumonia 2005; 2007; 2009; 2011   Sarcoidosis 1996   @ [facility]   Simple endometrial hyperplasia 2011   Spinal stenosis, lumbar    s/p decompression laminectomy 09/2013   Syncope    ?? Syncope ??   Type 2 diabetes, diet controlled (HCC)    Unspecified asthma(493.90)    Urinary incontinence, mixed 2011   VIN II (vulvar intraepithelial neoplasia II)    Past Surgical History:  Procedure Laterality Date   COLPOSCOPY VULVA W/ BIOPSY     "I've had maybe 3" (09/05/2013)   DILATION AND CURETTAGE OF UTERUS  2007   ECTOPIC PREGNANCY SURGERY  1976; ~ 1978   HYSTEROSCOPY DIAGNOSTIC  2011   LUMBAR LAMINECTOMY/DECOMPRESSION MICRODISCECTOMY N/A 09/05/2013   Procedure: Thoracic eleven-twelve Thoracic Laminectomy;  Surgeon: Tia Alert, MD;  Location: [facility];  Service: Neurosurgery;  Laterality: N/A;  Thoracic eleven-twelve Thoracic Laminectomy   MAXIMUM ACCESS (MAS)POSTERIOR LUMBAR INTERBODY FUSION  (PLIF) 1 LEVEL N/A 09/05/2013   Procedure: Lumbar four-five Maximum Access Surgery  Posterior lumbar interbody fusion;  Surgeon: Tia Alert, MD;  Location: [facility];  Service: Neurosurgery;  Laterality: N/A;  Lumbar four-five Maximum Access Surgery  Posterior lumbar interbody fusion   POSTERIOR LAMINECTOMY / DECOMPRESSION LUMBAR SPINE  09/05/2013   POSTERIOR LUMBAR FUSION  09/05/2013   THORACIC LAMINECTOMY  09/05/2013   URETHRAL SLING  2011   VULVECTOMY  ~ 2007   "for cancerous cells" (09/05/2013)   FAMILY HISTORY Family History  Problem Relation Age of Onset   Heart attack Father    Hypertension Father    Diabetes Father    Seizures Father    Migraines Father    Kidney disease Sister    Osteoporosis Sister    Heart attack Sister    Osteoporosis Sister  High Cholesterol Brother        x2   Lung cancer Maternal Uncle    Cancer Paternal Uncle    Glaucoma Maternal Grandfather    Asthma Daughter    Asthma Son    Kidney disease Other    Diabetes Other    Colon cancer Neg Hx    Colon polyps Neg Hx    Liver disease Neg Hx    Stomach cancer Neg Hx    Pancreatic cancer Neg Hx    Esophageal cancer Neg Hx    SOCIAL HISTORY Social History   Tobacco Use   Smoking status: Never   Smokeless tobacco: Never  Vaping Use   Vaping status: Never Used  Substance Use Topics   Alcohol use: Not Currently   Drug use: No       OPHTHALMIC EXAM:  Base Eye Exam     Visual Acuity (Snellen - Linear)       Right Left   Dist cc 20/40 +2 20/60 +1   Dist ph cc 20/30 20/40    Correction: Glasses         Tonometry (Tonopen, 1:29 PM)       Right Left   Pressure 14 12         Pupils       Pupils Dark Light Shape React APD   Right PERRL 3 2 Round Brisk None   Left PERRL 3 2 Round Brisk None         Visual Fields (Counting fingers)       Left Right    Full Full         Extraocular Movement       Right Left    Full, Ortho Full, Ortho         Neuro/Psych      Oriented x3: Yes   Mood/Affect: Normal         Dilation     Both eyes: 1.0% Mydriacyl, 2.5% Phenylephrine @ 1:30 PM           Slit Lamp and Fundus Exam     Slit Lamp Exam       Right Left   Lids/Lashes Dermatochalasis - upper lid, mild MGD Dermatochalasis - upper lid, mild MGD   Conjunctiva/Sclera mild melanosis mild melanosis   Cornea mild arcus, trace tear film debris, 1-2+ Guttata mild arcus, trace tear film debris   Anterior Chamber deep, clear, narrow temporal angle deep and clear   Iris Round and dilated, No NVI Round and dilated, No NVI   Lens 2+ Nuclear sclerosis, 2-3+ Cortical cataract 2+ Nuclear sclerosis, 2-3+ Cortical cataract   Anterior Vitreous mild syneresis mild syneresis         Fundus Exam       Right Left   Disc Pink and Sharp, +cupping, mild PPP mild Pallor, Sharp rim, +cupping, mild PPP   C/D Ratio 0.7 0.7   Macula Flat, Good foveal reflex, RPE mottling, No heme or edema Flat, Good foveal reflex, No heme or edema   Vessels mild attenuation, mild tortuosity mild attenuation, mild tortuosity   Periphery Attached, No heme Attached, No heme           Refraction     Wearing Rx       Sphere Cylinder Axis Add   Right +1.75 Sphere  +2.25   Left +0.50 +1.50 41 +71.47  73-49 year old rx specs         Manifest Refraction  Sphere Cylinder Axis Dist VA   Right +1.25 +0.75 025 20/30   Left [place] +1.50 150 20/30-2           IMAGING AND PROCEDURES  Imaging and Procedures for 07/19/2023  OCT, Retina - OU - Both Eyes       Right Eye Quality was good. Central Foveal Thickness: 253. Progression has no prior data. Findings include normal foveal contour, no IRF, no SRF.   Left Eye Quality was good. Central Foveal Thickness: 229. Progression has no prior data. Findings include normal foveal contour, no IRF, no SRF, vitreomacular adhesion .   Notes *Images captured and stored on drive  Diagnosis / Impression:  NFP, IRF/SRF OU No  DME OU  Clinical management:  See below  Abbreviations: NFP - Normal foveal profile. CME - cystoid macular edema. PED - pigment epithelial detachment. IRF - intraretinal fluid. SRF - subretinal fluid. EZ - ellipsoid zone. ERM - epiretinal membrane. ORA - outer retinal atrophy. ORT - outer retinal tubulation. SRHM - subretinal hyper-reflective material. IRHM - intraretinal hyper-reflective material           ASSESSMENT/PLAN:   ICD-10-CM   1. Diabetes mellitus type 2 without retinopathy (HCC)  E11.9 OCT, Retina - OU - Both Eyes    2. Long-term (current) use of injectable non-insulin antidiabetic drugs  Z79.85     3. Essential hypertension  I10     4. Hypertensive retinopathy of both eyes  H35.033     5. Combined forms of age-related cataract of both eyes  H25.813      1,2. Diabetes mellitus, type 2 without retinopathy  - last A1c 4.8 on 07.23.24 - The incidence, risk factors for progression, natural history and treatment options for diabetic retinopathy  were discussed with patient.   - The need for close monitoring of blood glucose, blood pressure, and serum lipids, avoiding cigarette or any type of tobacco, and the need for long term follow up was also discussed with patient. - f/u in 1 year, sooner prn  3,4. Hypertensive retinopathy OU - discussed importance of tight BP control - monitor  5. Mixed Cataract OU - The symptoms of cataract, surgical options, and treatments and risks were discussed with patient. - discussed diagnosis and progression - will refer to Dr. Zenaida Niece for cataract evaluation and management  Ophthalmic Meds Ordered this visit:  No orders of the defined types were placed in this encounter.    Return in about 1 year (around 07/18/2024) for f/u DM exam, DFE, OCT.  There are no Patient Instructions on file for this visit.  Explained the diagnoses, plan, and follow up with the patient and they expressed understanding.  Patient expressed understanding of the  importance of proper follow up care.   This document serves as a record of services personally performed by Karie Chimera, MD, PhD. It was created on their behalf by Gerilyn Nestle, COT an ophthalmic technician. The creation of this record is the provider's dictation and/or activities during the visit.    Electronically signed by:  Charlette Caffey, COT  07/19/23 6:28 PM   This document serves as a record of services personally performed by Karie Chimera, MD, PhD. It was created on their behalf by Glee Arvin. Manson Passey, OA an ophthalmic technician. The creation of this record is the provider's dictation and/or activities during the visit.    Electronically signed by: Glee Arvin. Manson Passey, OA 07/19/23 6:28 PM  Karie Chimera, M.D., Ph.D. Diseases & Surgery of  the Retina and Vitreous [facility] 07/19/2023  I have reviewed the above documentation for accuracy and completeness, and I agree with the above. Karie Chimera, M.D., Ph.D. 07/19/23 6:31 PM  Abbreviations: M myopia (nearsighted); A astigmatism; H hyperopia (farsighted); P presbyopia; Mrx spectacle prescription;  CTL contact lenses; OD right eye; OS left eye; OU both eyes  XT exotropia; ET esotropia; PEK punctate epithelial keratitis; PEE punctate epithelial erosions; DES dry eye syndrome; MGD meibomian gland dysfunction; ATs artificial tears; PFAT's preservative free artificial tears; NSC nuclear sclerotic cataract; PSC posterior subcapsular cataract; ERM epi-retinal membrane; PVD posterior vitreous detachment; RD retinal detachment; DM diabetes mellitus; DR diabetic retinopathy; NPDR non-proliferative diabetic retinopathy; PDR proliferative diabetic retinopathy; CSME clinically significant macular edema; DME diabetic macular edema; dbh dot blot hemorrhages; CWS cotton wool spot; POAG primary open angle glaucoma; C/D cup-to-disc ratio; HVF humphrey visual field; GVF goldmann visual field; OCT optical coherence  tomography; IOP intraocular pressure; BRVO Branch retinal vein occlusion; CRVO central retinal vein occlusion; CRAO central retinal artery occlusion; BRAO branch retinal artery occlusion; RT retinal tear; SB scleral buckle; PPV pars plana vitrectomy; VH Vitreous hemorrhage; PRP panretinal laser photocoagulation; IVK intravitreal kenalog; VMT vitreomacular traction; MH Macular hole;  NVD neovascularization of the disc; NVE neovascularization elsewhere; AREDS age related eye disease study; ARMD age related macular degeneration; POAG primary open angle glaucoma; EBMD epithelial/anterior basement membrane dystrophy; ACIOL anterior chamber intraocular lens; IOL intraocular lens; PCIOL posterior chamber intraocular lens; Phaco/IOL phacoemulsification with intraocular lens placement; PRK photorefractive keratectomy; LASIK laser assisted in situ keratomileusis; HTN hypertension; DM diabetes mellitus; COPD chronic obstructive pulmonary disease

## 2023-07-12 NOTE — Progress Notes (Deleted)
Office Visit    Patient Name: Cynthia Kirk Date of Encounter: 07/12/2023  Primary Care Provider:  Pincus Sanes, MD Primary Cardiologist:  None Primary Electrophysiologist: None   Past Medical History    Past Medical History:  Diagnosis Date   Abnormal Pap smear 2008   Adenomatous colon polyp    Allergic rhinitis, cause unspecified    Anxiety    ASCUS with positive high risk HPV    Blood transfusion 1976   Due to ectopic pregnancy   Chronic back pain    Ejection fraction    EF 45-50%, echo, October 27, 2011   Endometrial mass 2011   Esophageal reflux    Fibroid    H/O Clostridium difficile infection 03/2010   H/O menorrhagia 2011   Herpes simplex type 2 infection complicating pregnancy    Hiatal hernia    Iron deficiency anemia    Morbid obesity (HCC) 2010   Obstructive sleep apnea (adult) (pediatric)    cpap   Panic attacks    Plantar fasciitis    Pneumonia 2005; 2007; 2009; 2011   Sarcoidosis 1996   @ [facility]   Simple endometrial hyperplasia 2011   Spinal stenosis, lumbar    s/p decompression laminectomy 09/2013   Syncope    ?? Syncope ??   Type 2 diabetes, diet controlled (HCC)    Unspecified asthma(493.90)    Urinary incontinence, mixed 2011   VIN II (vulvar intraepithelial neoplasia II)    Past Surgical History:  Procedure Laterality Date   COLPOSCOPY VULVA W/ BIOPSY     "I've had maybe 3" (09/05/2013)   DILATION AND CURETTAGE OF UTERUS  2007   ECTOPIC PREGNANCY SURGERY  1976; ~ 1978   HYSTEROSCOPY DIAGNOSTIC  2011   LUMBAR LAMINECTOMY/DECOMPRESSION MICRODISCECTOMY N/A 09/05/2013   Procedure: Thoracic eleven-twelve Thoracic Laminectomy;  Surgeon: Tia Alert, MD;  Location: [facility];  Service: Neurosurgery;  Laterality: N/A;  Thoracic eleven-twelve Thoracic Laminectomy   MAXIMUM ACCESS (MAS)POSTERIOR LUMBAR INTERBODY FUSION (PLIF) 1 LEVEL N/A 09/05/2013   Procedure: Lumbar four-five Maximum Access Surgery  Posterior lumbar interbody fusion;   Surgeon: Tia Alert, MD;  Location: [facility];  Service: Neurosurgery;  Laterality: N/A;  Lumbar four-five Maximum Access Surgery  Posterior lumbar interbody fusion   POSTERIOR LAMINECTOMY / DECOMPRESSION LUMBAR SPINE  09/05/2013   POSTERIOR LUMBAR FUSION  09/05/2013   THORACIC LAMINECTOMY  09/05/2013   URETHRAL SLING  2011   VULVECTOMY  ~ 2007   "for cancerous cells" (09/05/2013)    Allergies  Allergies  Allergen Reactions   Aspirin Hives, Shortness Of Breath and Other (See Comments)    Passed out   Ibuprofen Nausea And Vomiting and Other (See Comments)    Upsets stomach   Latex Rash     History of Present Illness    Starsha Schoene  is a *** year old ***with a PMH of      Since last being seen in the office patient reports***.  Patient denies chest pain, palpitations, dyspnea, PND, orthopnea, nausea, vomiting, dizziness, syncope, edema, weight gain, or early satiety.     ***Notes:  Home Medications    Current Outpatient Medications  Medication Sig Dispense Refill   albuterol (PROVENTIL) (2.5 MG/3ML) 0.083% nebulizer solution TAKE 3ML(2.5MG ) BY NEBULIZATION EVERY 6 HOURS AS NEEDED FOR SHORTNESS OF BREATH (Patient taking differently: Take 2.5 mg by nebulization every 6 (six) hours as needed for wheezing or shortness of breath.) 120 mL 11   albuterol (VENTOLIN  HFA) 108 (90 Base) MCG/ACT inhaler INHALE 2 PUFF&nbsp;&nbsp;BY MOUTH&nbsp;&nbsp;EVERY 6 HOUR as NEEDED FOR WHEEZING ,SHORTNESS OF BREATH (Patient taking differently: Inhale 2 puffs into the lungs every 6 (six) hours as needed for wheezing or shortness of breath. INHALE 2 PUFF  BY MOUTH  EVERY 6 HOUR as NEEDED FOR WHEEZING ,SHORTNESS OF BREATH) 54 g 3   famotidine (PEPCID) 40 MG tablet Take 1 tablet (40 mg total) by mouth 2 (two) times daily as needed for heartburn or indigestion. 180 tablet 1   hydrochlorothiazide (HYDRODIURIL) 12.5 MG tablet Take 1 tablet (12.5 mg total) by mouth daily. 90 tablet 1   hydrocortisone  2.5 % cream Apply topically 2 (two) times daily. 30 g 0   hydroquinone 4 % cream Apply 1 application topically 2 (two) times daily. (Patient taking differently: Apply 1 application  topically daily as needed (itching).) 28.35 g 0   hydrOXYzine (ATARAX) 25 MG tablet Take 1 tablet (25 mg total) by mouth at bedtime as needed for itching. 30 tablet 5   ipratropium (ATROVENT) 0.06 % nasal spray Place 2 sprays into both nostrils 2 (two) times daily. 15 mL 2   Menthol-Methyl Salicylate (MUSCLE RUB) 10-15 % CREA Apply 1 Application topically as needed for muscle pain.     montelukast (SINGULAIR) 10 MG tablet TAKE 1 TABLET BY MOUTH EVERYDAY AT BEDTIME (Patient taking differently: Take 10 mg by mouth at bedtime.) 90 tablet 1   Olopatadine HCl 0.2 % SOLN Apply 1 drop to eye daily as needed (allergies). (Patient taking differently: Place 1 drop into both eyes daily as needed (allergies).) 2.5 mL 3   OZEMPIC, 2 MG/DOSE, 8 MG/3ML SOPN DIAL AND INJECT UNDER THE SKIN 2 MG WEEKLY (Patient taking differently: Inject 2 mg into the skin once a week. Tuesday) 9 mL 1   pantoprazole (PROTONIX) 40 MG tablet Take 1 tablet (40 mg total) by mouth daily. 90 tablet 2   No current facility-administered medications for this visit.     Review of Systems  Please see the history of present illness.    (+)*** (+)***  All other systems reviewed and are otherwise negative except as noted above.  Physical Exam    Wt Readings from Last 3 Encounters:  06/28/23 114 lb 13.8 oz (52.1 kg)  03/21/23 188 lb 6.4 oz (85.5 kg)  03/15/23 189 lb (85.7 kg)   WU:JWJXB were no vitals filed for this visit.,There is no height or weight on file to calculate BMI.  Constitutional:      Appearance: Healthy appearance. Not in distress.  Neck:     Vascular: JVD normal.  Pulmonary:     Effort: Pulmonary effort is normal.     Breath sounds: No wheezing. No rales. Diminished in the bases Cardiovascular:     Normal rate. Regular rhythm. Normal  S1. Normal S2.      Murmurs: There is no murmur.  Edema:    Peripheral edema absent.  Abdominal:     Palpations: Abdomen is soft non tender. There is no hepatomegaly.  Skin:    General: Skin is warm and dry.  Neurological:     General: No focal deficit present.     Mental Status: Alert and oriented to person, place and time.     Cranial Nerves: Cranial nerves are intact.  EKG/LABS/ Recent Cardiac Studies    ECG personally reviewed by me today - ***   Risk Assessment/Calculations:   {Does this patient have ATRIAL FIBRILLATION?[phone]}  Lab Results  Component Value Date   WBC 3.9 (L) 06/29/2023   HGB 11.1 (L) 06/29/2023   HCT 33.3 (L) 06/29/2023   MCV 90.0 06/29/2023   PLT 191 06/29/2023   Lab Results  Component Value Date   CREATININE 1.02 (H) 06/29/2023   BUN 17 06/29/2023   NA 139 06/29/2023   K 3.8 06/29/2023   CL 100 06/29/2023   CO2 26 06/29/2023   Lab Results  Component Value Date   ALT 11 06/29/2023   AST 19 06/29/2023   ALKPHOS 85 06/29/2023   BILITOT 0.3 06/29/2023   Lab Results  Component Value Date   CHOL 157 06/28/2023   HDL 62 06/28/2023   LDLCALC 88 06/28/2023   TRIG 33 06/28/2023   CHOLHDL 2.5 06/28/2023    Lab Results  Component Value Date   HGBA1C 4.8 06/28/2023     Assessment & Plan    1.***  2.***  3.***  4.***      Disposition: Follow-up with None or APP in *** months {Are you ordering a CV Procedure (e.g. stress test, cath, DCCV, TEE, etc)?   Press F2        :[id number]}   Medication Adjustments/Labs and Tests Ordered: Current medicines are reviewed at length with the patient today.  Concerns regarding medicines are outlined above.   Signed, Napoleon Form, Leodis Rains, NP 07/12/2023, 9:27 AM [facility]

## 2023-07-13 ENCOUNTER — Ambulatory Visit: Payer: Medicare Other | Admitting: Nurse Practitioner

## 2023-07-15 ENCOUNTER — Telehealth (HOSPITAL_COMMUNITY): Payer: Self-pay | Admitting: Cardiology

## 2023-07-15 NOTE — Telephone Encounter (Signed)
I called patient to schedule Myoview see attempts below:  07/15/23 Spoke with patient and she will call us back when she is ready to schedule @ 11 AM/LBW  07/12/23 [facility] to schedule @ 9:56/LBW  07/07/23 called and it would not go thru x 2 @ 8:52/LBW  7/29 spoke to patient who stated she would have to CB to schedule kst 06/30/23 Call would not go thru @ 11:02/LBW     Patient did not wish to schedule at this time and will call us back if she decides to have. Order will be removed from the [facility]. Thank you.

## 2023-07-19 ENCOUNTER — Ambulatory Visit (INDEPENDENT_AMBULATORY_CARE_PROVIDER_SITE_OTHER): Payer: Medicare Other | Admitting: Ophthalmology

## 2023-07-19 ENCOUNTER — Encounter (INDEPENDENT_AMBULATORY_CARE_PROVIDER_SITE_OTHER): Payer: Self-pay | Admitting: Ophthalmology

## 2023-07-19 DIAGNOSIS — I10 Essential (primary) hypertension: Secondary | ICD-10-CM

## 2023-07-19 DIAGNOSIS — Z7985 Long-term (current) use of injectable non-insulin antidiabetic drugs: Secondary | ICD-10-CM

## 2023-07-19 DIAGNOSIS — H35033 Hypertensive retinopathy, bilateral: Secondary | ICD-10-CM

## 2023-07-19 DIAGNOSIS — E119 Type 2 diabetes mellitus without complications: Secondary | ICD-10-CM | POA: Diagnosis not present

## 2023-07-19 DIAGNOSIS — H3581 Retinal edema: Secondary | ICD-10-CM

## 2023-07-19 DIAGNOSIS — H25813 Combined forms of age-related cataract, bilateral: Secondary | ICD-10-CM

## 2023-07-20 ENCOUNTER — Encounter: Payer: Self-pay | Admitting: Internal Medicine

## 2023-07-20 LAB — HM DIABETES EYE EXAM

## 2023-07-20 NOTE — Progress Notes (Signed)
Outside notes received. Information abstracted. Notes sent to scan.  

## 2023-07-28 ENCOUNTER — Other Ambulatory Visit: Payer: Self-pay | Admitting: Internal Medicine

## 2023-07-28 MED ORDER — HYDROQUINONE 4 % EX CREA: 1.0000 "application " | TOPICAL_CREAM | Freq: Two times a day (BID) | CUTANEOUS | 0 refills | Status: AC

## 2023-07-29 ENCOUNTER — Telehealth: Payer: Self-pay | Admitting: Pulmonary Disease

## 2023-07-29 NOTE — Telephone Encounter (Signed)
States ion a message to the answ service:   "She asked to speak to the person in charge of the sleep studies."

## 2023-07-29 NOTE — Telephone Encounter (Signed)
ATC patient back phone rings then goes to a fast busy signal

## 2023-07-29 NOTE — Telephone Encounter (Signed)
ATC pt.  Phone rings this fast busy signal Xs 3.  Closing per protocol.

## 2023-09-20 NOTE — Progress Notes (Deleted)
Cardiology Office Note    Patient Name: Cynthia Kirk Date of Encounter: 09/20/2023  Primary Care Provider:  Pincus Sanes, MD Primary Cardiologist:  None Primary Electrophysiologist: None   Past Medical History    Past Medical History:  Diagnosis Date   Abnormal Pap smear 2008   Adenomatous colon polyp    Allergic rhinitis, cause unspecified    Anxiety    ASCUS with positive high risk HPV    Blood transfusion 1976   Due to ectopic pregnancy   Chronic back pain    Ejection fraction    EF 45-50%, echo, October 27, 2011   Endometrial mass 2011   Esophageal reflux    Fibroid    H/O Clostridium difficile infection 03/2010   H/O menorrhagia 2011   Herpes simplex type 2 infection complicating pregnancy    Hiatal hernia    Iron deficiency anemia    Morbid obesity (HCC) 2010   Obstructive sleep apnea (adult) (pediatric)    cpap   Panic attacks    Plantar fasciitis    Pneumonia 2005; 2007; 2009; 2011   Sarcoidosis 1996   @ [facility]   Simple endometrial hyperplasia 2011   Spinal stenosis, lumbar    s/p decompression laminectomy 09/2013   Syncope    ?? Syncope ??   Type 2 diabetes, diet controlled (HCC)    Unspecified asthma(493.90)    Urinary incontinence, mixed 2011   VIN II (vulvar intraepithelial neoplasia II)     History of Present Illness  Cynthia Kirk is a 72 y.o. female with a PMH of HTN, IDA, DM type II, atypical chest pain, pulmonary sarcoidosis who presents today for hospital follow-up.  Cynthia Kirk presented to the hospital on 06/27/2023 with complaint of chest pain that she described as radiating to the back and neck and left arm.  Chest x-ray was completed showing chronic bilateral upper lobe scarring and fibrotic changes but no acute concerns. CT chest abdomen pelvis did not show an aortic dissection or aneurysm but noted groundglass and interstitial opacities in the upper lobes.  Patient's troponins were flat and EKG showed normal sinus rhythm without acute ST  or T wave changes.  Echo was completed showing EF of 60-65% with normal LV function and no RWMA and recommendation was made for outpatient stress test   During today's visit the patient reports*** .  Patient denies chest pain, palpitations, dyspnea, PND, orthopnea, nausea, vomiting, dizziness, syncope, edema, weight gain, or early satiety.  ***Notes: -Last ischemic evaluation: -Last echo: -Interim ED visits: Review of Systems  Please see the history of present illness.    All other systems reviewed and are otherwise negative except as noted above.  Physical Exam    Wt Readings from Last 3 Encounters:  06/28/23 114 lb 13.8 oz (52.1 kg)  03/21/23 188 lb 6.4 oz (85.5 kg)  03/15/23 189 lb (85.7 kg)   NF:AOZHY were no vitals filed for this visit.,There is no height or weight on file to calculate BMI. GEN: Well nourished, well developed in no acute distress Neck: No JVD; No carotid bruits Pulmonary: Clear to auscultation without rales, wheezing or rhonchi  Cardiovascular: Normal rate. Regular rhythm. Normal S1. Normal S2.   Murmurs: There is no murmur.  ABDOMEN: Soft, non-tender, non-distended EXTREMITIES:  No edema; No deformity   EKG/LABS/ Recent Cardiac Studies   ECG personally reviewed by me today - ***  Risk Assessment/Calculations:   {Does this patient have ATRIAL FIBRILLATION?:[phone]}      Lab Results  Component Value Date   WBC 3.9 (L) 06/29/2023   HGB 11.1 (L) 06/29/2023   HCT 33.3 (L) 06/29/2023   MCV 90.0 06/29/2023   PLT 191 06/29/2023   Lab Results  Component Value Date   CREATININE 1.02 (H) 06/29/2023   BUN 17 06/29/2023   NA 139 06/29/2023   K 3.8 06/29/2023   CL 100 06/29/2023   CO2 26 06/29/2023   Lab Results  Component Value Date   CHOL 157 06/28/2023   HDL 62 06/28/2023   LDLCALC 88 06/28/2023   TRIG 33 06/28/2023   CHOLHDL 2.5 06/28/2023    Lab Results  Component Value Date   HGBA1C 4.8 06/28/2023   Assessment & Plan    1.  Atypical  chest pain: -Today patient reports***  2.  Essential hypertension  3.  DM type II  4.  History of sarcoidosis: -Patient follows with pulmonology with CT of the chest showing fibrotic lung changes during most recent admission.      Disposition: Follow-up with None or APP in *** months {Are you ordering a CV Procedure (e.g. stress test, cath, DCCV, TEE, etc)?   Press F2        :[id number]}   Signed, Napoleon Form, Leodis Rains, NP 09/20/2023, 5:08 PM [facility]

## 2023-09-22 ENCOUNTER — Ambulatory Visit: Payer: Medicare HMO | Attending: Nurse Practitioner | Admitting: Nurse Practitioner

## 2023-09-22 DIAGNOSIS — D860 Sarcoidosis of lung: Secondary | ICD-10-CM

## 2023-09-22 DIAGNOSIS — I10 Essential (primary) hypertension: Secondary | ICD-10-CM

## 2023-09-22 DIAGNOSIS — R0789 Other chest pain: Secondary | ICD-10-CM

## 2023-09-22 DIAGNOSIS — E118 Type 2 diabetes mellitus with unspecified complications: Secondary | ICD-10-CM

## 2023-10-18 ENCOUNTER — Encounter: Payer: Self-pay | Admitting: Internal Medicine

## 2023-10-18 NOTE — Progress Notes (Unsigned)
Subjective:    Patient ID: Cynthia Kirk, female    DOB: December 04, 1951, 72 y.o.   MRN: [id number]     HPI Cynthia Kirk is here for follow up of her chronic medical problems.  Regular headaches - ? Sinus.  Took coricidin and it did not help.   She may have it 2-3 days a week.  Pain is severe  - has to lay down.  Does not take anything.  Sometimes starts in posterior and comes forward or sometimes it starts at the top of the head.   Has tried ice.  Laying down does not help, but sometimes she can fall asleep - wakes up and it is still there but better. Has neck pain and stiffness on left side of neck - chronic - not worse when she has the headaches.    Having increased GERD - no change in diet.  Pantoprazole no longer working.  Taking famotidine in addition to pantoprazole daily to 3 times a day   She is exercising more. Averages 3000-6000 steps per day.    Medications and allergies reviewed with patient and updated if appropriate.  Current Outpatient Medications on File Prior to Visit  Medication Sig Dispense Refill   albuterol (PROVENTIL) (2.5 MG/3ML) 0.083% nebulizer solution TAKE 3ML(2.5MG ) BY NEBULIZATION EVERY 6 HOURS AS NEEDED FOR SHORTNESS OF BREATH (Patient taking differently: Take 2.5 mg by nebulization every 6 (six) hours as needed for wheezing or shortness of breath.) 120 mL 11   albuterol (VENTOLIN HFA) 108 (90 Base) MCG/ACT inhaler INHALE 2 PUFF&nbsp;&nbsp;BY MOUTH&nbsp;&nbsp;EVERY 6 HOUR as NEEDED FOR WHEEZING ,SHORTNESS OF BREATH (Patient taking differently: Inhale 2 puffs into the lungs every 6 (six) hours as needed for wheezing or shortness of breath. INHALE 2 PUFF  BY MOUTH  EVERY 6 HOUR as NEEDED FOR WHEEZING ,SHORTNESS OF BREATH) 54 g 3   famotidine (PEPCID) 40 MG tablet Take 1 tablet (40 mg total) by mouth 2 (two) times daily as needed for heartburn or indigestion. 180 tablet 1   hydrochlorothiazide (HYDRODIURIL) 12.5 MG tablet Take 1 tablet (12.5 mg total) by mouth daily.  90 tablet 1   hydrocortisone 2.5 % cream Apply topically 2 (two) times daily. 30 g 0   hydroquinone 4 % cream Apply 1 application  topically 2 (two) times daily. 28.35 g 0   hydrOXYzine (ATARAX) 25 MG tablet Take 1 tablet (25 mg total) by mouth at bedtime as needed for itching. 30 tablet 5   ipratropium (ATROVENT) 0.06 % nasal spray Place 2 sprays into both nostrils 2 (two) times daily. 15 mL 2   Menthol-Methyl Salicylate (MUSCLE RUB) 10-15 % CREA Apply 1 Application topically as needed for muscle pain.     montelukast (SINGULAIR) 10 MG tablet TAKE 1 TABLET BY MOUTH EVERYDAY AT BEDTIME (Patient taking differently: Take 10 mg by mouth at bedtime.) 90 tablet 1   Olopatadine HCl 0.2 % SOLN Apply 1 drop to eye daily as needed (allergies). (Patient taking differently: Place 1 drop into both eyes daily as needed (allergies).) 2.5 mL 3   OZEMPIC, 2 MG/DOSE, 8 MG/3ML SOPN DIAL AND INJECT UNDER THE SKIN 2 MG WEEKLY (Patient taking differently: Inject 2 mg into the skin once a week. Tuesday) 9 mL 1   pantoprazole (PROTONIX) 40 MG tablet Take 1 tablet (40 mg total) by mouth daily. 90 tablet 2   No current facility-administered medications on file prior to visit.     Review of Systems  Constitutional:  Negative  for fever.  Respiratory:  Positive for cough (occ with colder weather). Negative for shortness of breath and wheezing.   Cardiovascular:  Positive for leg swelling (occ). Negative for chest pain and palpitations.  Musculoskeletal:  Positive for neck pain.  Neurological:  Positive for headaches (posterior - likely from neck). Negative for light-headedness.       Objective:   Vitals:   10/19/23 1413  BP: 128/72  Pulse: 72  Temp: 98 F (36.7 C)  SpO2: 100%   BP Readings from Last 3 Encounters:  10/19/23 128/72  06/29/23 138/86  03/21/23 134/86   Wt Readings from Last 3 Encounters:  10/19/23 188 lb (85.3 kg)  06/28/23 114 lb 13.8 oz (52.1 kg)  03/21/23 188 lb 6.4 oz (85.5 kg)    Body mass index is 32.27 kg/m.    Physical Exam Constitutional:      General: She is not in acute distress.    Appearance: Normal appearance.  HENT:     Head: Normocephalic and atraumatic.  Eyes:     Conjunctiva/sclera: Conjunctivae normal.  Cardiovascular:     Rate and Rhythm: Normal rate and regular rhythm.     Heart sounds: Normal heart sounds.  Pulmonary:     Effort: Pulmonary effort is normal. No respiratory distress.     Breath sounds: Normal breath sounds. No wheezing.  Musculoskeletal:     Cervical back: Neck supple.     Right lower leg: No edema.     Left lower leg: No edema.  Lymphadenopathy:     Cervical: No cervical adenopathy.  Skin:    General: Skin is warm and dry.     Findings: No rash.  Neurological:     Mental Status: She is alert. Mental status is at baseline.  Psychiatric:        Mood and Affect: Mood normal.        Behavior: Behavior normal.        Lab Results  Component Value Date   WBC 3.9 (L) 06/29/2023   HGB 11.1 (L) 06/29/2023   HCT 33.3 (L) 06/29/2023   PLT 191 06/29/2023   GLUCOSE 95 06/29/2023   CHOL 157 06/28/2023   TRIG 33 06/28/2023   HDL 62 06/28/2023   LDLCALC 88 06/28/2023   ALT 11 06/29/2023   AST 19 06/29/2023   NA 139 06/29/2023   K 3.8 06/29/2023   CL 100 06/29/2023   CREATININE 1.02 (H) 06/29/2023   BUN 17 06/29/2023   CO2 26 06/29/2023   TSH 0.819 06/28/2023   HGBA1C 4.8 06/28/2023   MICROALBUR 1.4 11/22/2022     Assessment & Plan:    See Problem List for Assessment and Plan of chronic medical problems.

## 2023-10-18 NOTE — Patient Instructions (Addendum)
      Blood work was ordered.   The lab is on the first floor.    Medications changes include :   gabapentin 100-300 mg at bedtime.  Stop pantoprazole - start dexilant 30 mg     Schedule your mammogram.     Return in about 6 months (around 04/17/2024) for Physical Exam.

## 2023-10-19 ENCOUNTER — Ambulatory Visit: Payer: Medicare HMO | Admitting: Internal Medicine

## 2023-10-19 ENCOUNTER — Other Ambulatory Visit: Payer: Self-pay | Admitting: Internal Medicine

## 2023-10-19 VITALS — BP 128/72 | HR 72 | Temp 98.0°F | Ht 64.0 in | Wt 188.0 lb

## 2023-10-19 DIAGNOSIS — E538 Deficiency of other specified B group vitamins: Secondary | ICD-10-CM

## 2023-10-19 DIAGNOSIS — Z7985 Long-term (current) use of injectable non-insulin antidiabetic drugs: Secondary | ICD-10-CM

## 2023-10-19 DIAGNOSIS — R600 Localized edema: Secondary | ICD-10-CM

## 2023-10-19 DIAGNOSIS — K219 Gastro-esophageal reflux disease without esophagitis: Secondary | ICD-10-CM

## 2023-10-19 DIAGNOSIS — R051 Acute cough: Secondary | ICD-10-CM

## 2023-10-19 DIAGNOSIS — E1169 Type 2 diabetes mellitus with other specified complication: Secondary | ICD-10-CM

## 2023-10-19 DIAGNOSIS — Z23 Encounter for immunization: Secondary | ICD-10-CM | POA: Diagnosis not present

## 2023-10-19 DIAGNOSIS — F419 Anxiety disorder, unspecified: Secondary | ICD-10-CM

## 2023-10-19 MED ORDER — FAMOTIDINE 40 MG PO TABS: 40.0000 mg | ORAL_TABLET | Freq: Two times a day (BID) | ORAL | 1 refills | Status: DC | PRN

## 2023-10-19 MED ORDER — OZEMPIC (2 MG/DOSE) 8 MG/3ML ~~LOC~~ SOPN: 2.0000 mg | PEN_INJECTOR | SUBCUTANEOUS | 1 refills | Status: DC

## 2023-10-19 MED ORDER — DEXLANSOPRAZOLE 30 MG PO CPDR: 30.0000 mg | DELAYED_RELEASE_CAPSULE | Freq: Every day | ORAL | 5 refills | Status: DC

## 2023-10-19 MED ORDER — HYDROCOD POLI-CHLORPHE POLI ER 10-8 MG/5ML PO SUER: 5.0000 mL | Freq: Two times a day (BID) | ORAL | 0 refills | Status: DC | PRN

## 2023-10-19 MED ORDER — HYDROCHLOROTHIAZIDE 12.5 MG PO TABS: 12.5000 mg | ORAL_TABLET | Freq: Every day | ORAL | 1 refills | Status: DC | PRN

## 2023-10-19 MED ORDER — HYDROCHLOROTHIAZIDE 12.5 MG PO TABS: 12.5000 mg | ORAL_TABLET | Freq: Every day | ORAL | Status: DC | PRN

## 2023-10-19 MED ORDER — GABAPENTIN 100 MG PO CAPS: 100.0000 mg | ORAL_CAPSULE | Freq: Every day | ORAL | 3 refills | Status: DC

## 2023-10-19 NOTE — Assessment & Plan Note (Signed)
Chronic Controlled, stable Occ feels anxious - not taking anything

## 2023-10-19 NOTE — Assessment & Plan Note (Addendum)
Chronic Intermittent CMP Taking hctz 12.5 mg daily prn - takes 1-2 times a week

## 2023-10-19 NOTE — Assessment & Plan Note (Signed)
Chronic  Lab Results  Component Value Date   HGBA1C 4.8 06/28/2023   Sugars very well controlled Check A1c Continue ozempic 2 mg weekly Stressed regular exercise, diabetic diet

## 2023-10-19 NOTE — Assessment & Plan Note (Signed)
Chronic Taking B12 supplements Check B12 level

## 2023-10-19 NOTE — Assessment & Plan Note (Signed)
Calcium low on previous blood work Check vitamin D level, CMP

## 2023-10-19 NOTE — Assessment & Plan Note (Signed)
Acute Developed a recent cold and is experiencing a cough Over-the-counter cough medication has not been helpful-wondered if she could have a prescription for Tussionex which works for her Tussionex sent to pharmacy.

## 2023-10-19 NOTE — Assessment & Plan Note (Addendum)
Chronic Not controlled Taking Pepcid 40 mg 2-3 times a day, which helps some Taking pantoprazole 40 mg daily in the morning - not working Previously has taken omeprazole and it was not effective Was on Dexilant in the past and that was effective Start Dexilant 30 mg daily-discontinue pantoprazole Continue famotidine

## 2023-11-05 ENCOUNTER — Encounter: Payer: Self-pay | Admitting: Internal Medicine

## 2023-11-08 ENCOUNTER — Telehealth: Payer: Self-pay | Admitting: Internal Medicine

## 2023-11-08 NOTE — Telephone Encounter (Signed)
I did already send her a mychart message a few days ago - asking what she has tried - she has tried several meds in the past that have not been effective -- we can either retry one or try to get PA for dexilant but need to know what she has already tried and have not worked.

## 2023-11-08 NOTE — Telephone Encounter (Signed)
Pt insurance company states that medication : Dexlansoprazole (DEXILANT) 30 MG capsule DR [[id number]] is not covered by her insurance and she gave me 4 different names that Francine Graven covers which is: Pantoprazole  Omeprazole Lansoprazole Esomeprazole  Pt needs to be prescribed one out of the 4 of those listed meds. Please advise, Thanks CB# [phone]-- ANN HUMANA

## 2023-11-12 ENCOUNTER — Other Ambulatory Visit: Payer: Self-pay | Admitting: Internal Medicine

## 2023-11-12 DIAGNOSIS — E1169 Type 2 diabetes mellitus with other specified complication: Secondary | ICD-10-CM

## 2023-11-18 NOTE — Telephone Encounter (Signed)
Called and left message for patient today 

## 2024-01-12 ENCOUNTER — Other Ambulatory Visit (HOSPITAL_COMMUNITY): Payer: Self-pay

## 2024-02-13 LAB — HM DIABETES EYE EXAM

## 2024-03-29 ENCOUNTER — Other Ambulatory Visit: Payer: Self-pay | Admitting: Internal Medicine

## 2024-04-16 ENCOUNTER — Encounter: Payer: Self-pay | Admitting: Internal Medicine

## 2024-04-16 NOTE — Patient Instructions (Addendum)
      Blood work was ordered.       Medications changes include :   None    A referral was ordered and someone will call you to schedule an appointment.     Return in about 6 months (around 10/18/2024) for Physical Exam.

## 2024-04-16 NOTE — Progress Notes (Unsigned)
 Virtual Visit via Video Note  I connected with Cynthia Kirk on 04/16/24 at  1:00 PM EDT by a video enabled telemedicine application and verified that I am speaking with the correct person using two identifiers.   I discussed the limitations of evaluation and management by telemedicine and the availability of in person appointments. The patient expressed understanding and agreed to proceed.  Present for the visit:  Myself, Dr Oma Bias, Cynthia Kirk.  The patient is currently at home and I am in the office.    No referring provider.    History of Present Illness: She is here for follow up of her chronic medical conditions.    Sore throat that was intermittent started one week ago.  5 nights ago it was more severe.  Friday - 4 days ago she did not feel well.  3 days ago felt really bad.  She had headaches, diarrhea, decreased appetite, vomiting.  The diarrhea is gone. Has had nausea but has not vomiting x 2 days.  She feels like her symptoms are getting better.  Having bad cramping in abdomen.  ? Related to ozempic  - she stopped the ozempic  and did not have any cramping.  The cramping was in the lower abdomen and around her naval.    She is exercising.  She is eating a good amount of protein and drinking plenty of water.   She restarted her BP medication -she only takes it sometimes.  Her blood pressure is good a lot of at times but sometimes is elevated so she been taking half of her olmesartan -amlodipine-hydrochlorothiazide  that she had from a previous doctor as needed.  If she does have swelling she sometimes will take the hydrochlorothiazide  12.5 mg as needed  Was on olmesartan  -40  -amlodipone -5 Hydrochlorothiazide  -25  Review of Systems  Constitutional:  Negative for fever.  Respiratory:  Negative for cough, shortness of breath and wheezing.   Cardiovascular:  Positive for leg swelling. Negative for chest pain and palpitations.  Gastrointestinal:  Negative for heartburn  (controlled).  Neurological:  Negative for headaches.       Occ lightheadedness      Social History   Socioeconomic History   Marital status: Married    Spouse name: Not on file   Number of children: 2   Years of education: PHD   Highest education level: Doctorate  Occupational History   Occupation: retired    Associate Professor: UNEMPLOYED  Tobacco Use   Smoking status: Never   Smokeless tobacco: Never  Vaping Use   Vaping status: Never Used  Substance and Sexual Activity   Alcohol use: Not Currently   Drug use: No   Sexual activity: Yes    Birth control/protection: Post-menopausal  Other Topics Concern   Not on file  Social History Narrative   Pt lives at home with her spouse.   She does not use caffeine.   Social Drivers of Health   Financial Resource Strain: Medium Risk (02/24/2023)   Overall Financial Resource Strain (CARDIA)    Difficulty of Paying Living Expenses: Somewhat hard  Food Insecurity: No Food Insecurity (06/28/2023)   Hunger Vital Sign    Worried About Running Out of Food in the Last Year: Never true    Ran Out of Food in the Last Year: Never true  Transportation Needs: No Transportation Needs (06/28/2023)   PRAPARE - Administrator, Civil Service (Medical): No    Lack of Transportation (Non-Medical): No  Physical Activity: Insufficiently Active (  02/24/2023)   Exercise Vital Sign    Days of Exercise per Week: 5 days    Minutes of Exercise per Session: 20 min  Stress: Stress Concern Present (02/24/2023)   [employer] of Occupational Health - Occupational Stress Questionnaire    Feeling of Stress : To some extent  Social Connections: Socially Integrated (02/24/2023)   Social Connection and Isolation Panel [NHANES]    Frequency of Communication with Friends and Family: Twice a week    Frequency of Social Gatherings with Friends and Family: Once a week    Attends Religious Services: More than 4 times per year    Active Member of [employer] or  Organizations: Yes    Attends Engineer, structural: More than 4 times per year    Marital Status: Married     Observations/Objective: Appears well in NAD   Assessment and Plan:  See Problem List for Assessment and Plan of chronic medical problems.   Follow Up Instructions:    I discussed the assessment and treatment plan with the patient. The patient was provided an opportunity to ask questions and all were answered. The patient agreed with the plan and demonstrated an understanding of the instructions.   The patient was advised to call back or seek an in-person evaluation if the symptoms worsen or if the condition fails to improve as anticipated.    Colene Dauphin, MD

## 2024-04-17 ENCOUNTER — Ambulatory Visit: Payer: Medicare HMO | Admitting: Internal Medicine

## 2024-04-17 ENCOUNTER — Telehealth (INDEPENDENT_AMBULATORY_CARE_PROVIDER_SITE_OTHER): Admitting: Internal Medicine

## 2024-04-17 DIAGNOSIS — Z7985 Long-term (current) use of injectable non-insulin antidiabetic drugs: Secondary | ICD-10-CM | POA: Diagnosis not present

## 2024-04-17 DIAGNOSIS — I10 Essential (primary) hypertension: Secondary | ICD-10-CM

## 2024-04-17 DIAGNOSIS — R600 Localized edema: Secondary | ICD-10-CM

## 2024-04-17 DIAGNOSIS — K219 Gastro-esophageal reflux disease without esophagitis: Secondary | ICD-10-CM | POA: Diagnosis not present

## 2024-04-17 DIAGNOSIS — E1169 Type 2 diabetes mellitus with other specified complication: Secondary | ICD-10-CM | POA: Diagnosis not present

## 2024-04-17 DIAGNOSIS — E538 Deficiency of other specified B group vitamins: Secondary | ICD-10-CM

## 2024-04-17 MED ORDER — SEMAGLUTIDE (1 MG/DOSE) 4 MG/3ML ~~LOC~~ SOPN: 1.0000 mg | PEN_INJECTOR | SUBCUTANEOUS | 5 refills | Status: DC

## 2024-04-17 MED ORDER — HYDROCHLOROTHIAZIDE 25 MG PO TABS: 25.0000 mg | ORAL_TABLET | Freq: Every day | ORAL | 1 refills | Status: AC

## 2024-04-17 NOTE — Assessment & Plan Note (Signed)
 Chronic Intermittent CMP Taking hctz 12.5 mg daily prn - takes 1-2 times a week

## 2024-04-17 NOTE — Assessment & Plan Note (Signed)
 Chronic Not controlled Continue Pepcid  40 mg 2  times a day  Continue Dexilant  30 mg daily

## 2024-04-17 NOTE — Assessment & Plan Note (Addendum)
 Chronic Taking B12 supplements Check B12 level, cbc

## 2024-04-17 NOTE — Assessment & Plan Note (Addendum)
 Chronic  Lab Results  Component Value Date   HGBA1C 4.8 06/28/2023   Sugars very well controlled Check A1c, urine albumin /cr ratio Ozempic  2 mg weekly was causing cramping-improved when she stopped taking it-cramping was lower abdomen Decrease Ozempic  to 1 mg weekly-can further decrease since her sugars are still well-controlled Stressed regular exercise, diabetic diet

## 2024-04-17 NOTE — Assessment & Plan Note (Signed)
 Chronic Blood pressure has been well-controlled, but recently she has been having spikes of her blood pressure Has been taking half of an old BP medication but only taking it as needed - Advised to stop the above Start hydrochlorothiazide  25 mg daily which will help with her edema and her blood pressure Continue to monitor BP at home CBC, CMP ordered

## 2024-04-18 ENCOUNTER — Other Ambulatory Visit: Payer: Self-pay | Admitting: Internal Medicine

## 2024-04-18 ENCOUNTER — Other Ambulatory Visit: Payer: Self-pay

## 2024-04-18 MED ORDER — FAMOTIDINE 40 MG PO TABS: 40.0000 mg | ORAL_TABLET | Freq: Two times a day (BID) | ORAL | 2 refills | Status: AC

## 2024-04-20 ENCOUNTER — Other Ambulatory Visit: Payer: Self-pay | Admitting: Internal Medicine

## 2024-05-21 ENCOUNTER — Telehealth: Payer: Self-pay | Admitting: Internal Medicine

## 2024-05-21 NOTE — Telephone Encounter (Signed)
 Contacted Cynthia Kirk to schedule their annual wellness visit. Patient declined to schedule AWV at this time.Patient has moved to [place] .    [facility] Care Guide [facility] AWV TEAM Direct Dial: [phone]

## 2024-05-22 ENCOUNTER — Other Ambulatory Visit (INDEPENDENT_AMBULATORY_CARE_PROVIDER_SITE_OTHER)

## 2024-05-22 DIAGNOSIS — E538 Deficiency of other specified B group vitamins: Secondary | ICD-10-CM | POA: Diagnosis not present

## 2024-05-22 DIAGNOSIS — E1169 Type 2 diabetes mellitus with other specified complication: Secondary | ICD-10-CM | POA: Diagnosis not present

## 2024-05-22 LAB — CBC WITH DIFFERENTIAL/PLATELET
Basophils Absolute: 0 10*3/uL (ref 0.0–0.1)
Basophils Relative: 0.6 % (ref 0.0–3.0)
Eosinophils Absolute: 0.1 10*3/uL (ref 0.0–0.7)
Eosinophils Relative: 4.1 % (ref 0.0–5.0)
HCT: 34.3 % — ABNORMAL LOW (ref 36.0–46.0)
Hemoglobin: 11.4 g/dL — ABNORMAL LOW (ref 12.0–15.0)
Lymphocytes Relative: 27.5 % (ref 12.0–46.0)
Lymphs Abs: 1 10*3/uL (ref 0.7–4.0)
MCHC: 33.3 g/dL (ref 30.0–36.0)
MCV: 88.3 fl (ref 78.0–100.0)
Monocytes Absolute: 0.4 10*3/uL (ref 0.1–1.0)
Monocytes Relative: 11.4 % (ref 3.0–12.0)
Neutro Abs: 2 10*3/uL (ref 1.4–7.7)
Neutrophils Relative %: 56.4 % (ref 43.0–77.0)
Platelets: 218 10*3/uL (ref 150.0–400.0)
RBC: 3.89 Mil/uL (ref 3.87–5.11)
RDW: 14 % (ref 11.5–15.5)
WBC: 3.5 10*3/uL — ABNORMAL LOW (ref 4.0–10.5)

## 2024-05-22 LAB — COMPREHENSIVE METABOLIC PANEL WITH GFR
ALT: 8 U/L (ref 0–35)
AST: 18 U/L (ref 0–37)
Albumin: 3.8 g/dL (ref 3.5–5.2)
Alkaline Phosphatase: 102 U/L (ref 39–117)
BUN: 17 mg/dL (ref 6–23)
CO2: 30 meq/L (ref 19–32)
Calcium: 8.9 mg/dL (ref 8.4–10.5)
Chloride: 103 meq/L (ref 96–112)
Creatinine, Ser: 0.85 mg/dL (ref 0.40–1.20)
GFR: 68.04 mL/min (ref >60.00)
Glucose, Bld: 78 mg/dL (ref 70–99)
Potassium: 3.8 meq/L (ref 3.5–5.1)
Sodium: 137 meq/L (ref 135–145)
Total Bilirubin: 0.5 mg/dL (ref 0.2–1.2)
Total Protein: 6.4 g/dL (ref 6.0–8.3)

## 2024-05-22 LAB — HEMOGLOBIN A1C: Hgb A1c MFr Bld: 5.1 % (ref 4.6–6.5)

## 2024-05-22 LAB — MICROALBUMIN / CREATININE URINE RATIO
Creatinine,U: 82 mg/dL
Microalb Creat Ratio: UNDETERMINED mg/g (ref 0.0–30.0)
Microalb, Ur: <0.7 mg/dL

## 2024-05-22 LAB — LIPID PANEL
Cholesterol: 161 mg/dL (ref 0–200)
HDL: 55.9 mg/dL (ref >39.00)
LDL Cholesterol: 92 mg/dL (ref 0–99)
NonHDL: 104.98
Total CHOL/HDL Ratio: 3
Triglycerides: 67 mg/dL (ref 0.0–149.0)
VLDL: 13.4 mg/dL (ref 0.0–40.0)

## 2024-05-22 LAB — VITAMIN D 25 HYDROXY (VIT D DEFICIENCY, FRACTURES): VITD: 20.82 ng/mL — ABNORMAL LOW (ref 30.00–100.00)

## 2024-05-22 LAB — VITAMIN B12: Vitamin B-12: >1500 pg/mL — ABNORMAL HIGH (ref 211–911)

## 2024-05-23 ENCOUNTER — Ambulatory Visit: Payer: Self-pay | Admitting: Internal Medicine

## 2024-05-24 ENCOUNTER — Encounter: Payer: Self-pay | Admitting: Internal Medicine

## 2024-05-24 DIAGNOSIS — J45909 Unspecified asthma, uncomplicated: Secondary | ICD-10-CM

## 2024-05-25 ENCOUNTER — Other Ambulatory Visit: Payer: Self-pay

## 2024-05-25 DIAGNOSIS — D860 Sarcoidosis of lung: Secondary | ICD-10-CM

## 2024-05-25 DIAGNOSIS — J0100 Acute maxillary sinusitis, unspecified: Secondary | ICD-10-CM

## 2024-05-25 DIAGNOSIS — J45909 Unspecified asthma, uncomplicated: Secondary | ICD-10-CM

## 2024-05-25 DIAGNOSIS — J45998 Other asthma: Secondary | ICD-10-CM

## 2024-05-26 MED ORDER — MONTELUKAST SODIUM 10 MG PO TABS: ORAL_TABLET | ORAL | 1 refills | Status: DC

## 2024-05-26 MED ORDER — ALBUTEROL SULFATE HFA 108 (90 BASE) MCG/ACT IN AERS: INHALATION_SPRAY | RESPIRATORY_TRACT | 3 refills | Status: AC

## 2024-05-27 ENCOUNTER — Other Ambulatory Visit: Payer: Self-pay | Admitting: Internal Medicine

## 2024-05-28 NOTE — Telephone Encounter (Signed)
 3 days ago, this patient requested a refill be sent into [facility] in [place]. It looks like she hasn't established care with another provider yet. I will wait before changing her PCP

## 2024-07-02 NOTE — Progress Notes (Shared)
 [facility] - Clinic Note  07/11/2024   CHIEF COMPLAINT Patient presents for No chief complaint on file.  HISTORY OF PRESENT ILLNESS: Cynthia Kirk is a 73 y.o. female who presents to the clinic today for:   Pt states   Referring physician: Geofm Glade PARAS, MD [address] [place],  [place] [address]  HISTORICAL INFORMATION:  Selected notes from the MEDICAL RECORD NUMBER Referred by Dr. Glade Geofm for DM exam LEE:  Ocular Hx- PMH-   CURRENT MEDICATIONS: Current Outpatient Medications (Ophthalmic Drugs)  Medication Sig   Olopatadine  HCl 0.2 % SOLN Apply 1 drop to eye daily as needed (allergies). (Patient taking differently: Place 1 drop into both eyes daily as needed (allergies).)   No current facility-administered medications for this visit. (Ophthalmic Drugs)   Current Outpatient Medications (Other)  Medication Sig   albuterol  (PROVENTIL ) (2.5 MG/3ML) 0.083% nebulizer solution TAKE 3ML(2.5MG ) BY NEBULIZATION EVERY 6 HOURS AS NEEDED FOR SHORTNESS OF BREATH (Patient taking differently: Take 2.5 mg by nebulization every 6 (six) hours as needed for wheezing or shortness of breath.)   albuterol  (VENTOLIN  HFA) 108 (90 Base) MCG/ACT inhaler INHALE 2 PUFF EVERY 6 HOUR as NEEDED FOR WHEEZING ,SHORTNESS OF BREATH   Dexlansoprazole  30 MG capsule DR TAKE 1 CAPSULE BY MOUTH EVERY DAY   famotidine  (PEPCID ) 40 MG tablet Take 1 tablet (40 mg total) by mouth 2 (two) times daily.   gabapentin  (NEURONTIN ) 100 MG capsule TAKE 1-3 CAPSULES (100-300 MG TOTAL) BY MOUTH AT BEDTIME.   hydrochlorothiazide  (HYDRODIURIL ) 25 MG tablet Take 1 tablet (25 mg total) by mouth daily.   hydrocortisone  2.5 % cream Apply topically 2 (two) times daily.   hydroquinone  4 % cream Apply 1 application  topically 2 (two) times daily.   hydrOXYzine  (ATARAX ) 25 MG tablet TAKE 1 TABLET BY MOUTH AT BEDTIME AS NEEDED FOR ITCHING.   ipratropium (ATROVENT ) 0.06 % nasal spray Place 2 sprays into both  nostrils 2 (two) times daily.   Menthol -Methyl Salicylate (MUSCLE RUB) 10-15 % CREA Apply 1 Application topically as needed for muscle pain.   montelukast  (SINGULAIR ) 10 MG tablet TAKE 1 TABLET BY MOUTH EVERYDAY AT BEDTIME   Semaglutide , 1 MG/DOSE, 4 MG/3ML SOPN Inject 1 mg as directed once a week.   No current facility-administered medications for this visit. (Other)   REVIEW OF SYSTEMS:   ALLERGIES Allergies  Allergen Reactions   Aspirin Hives, Shortness Of Breath and Other (See Comments)    Passed out   Ibuprofen Nausea And Vomiting and Other (See Comments)    Upsets stomach   Latex Rash   PAST MEDICAL HISTORY Past Medical History:  Diagnosis Date   Abnormal Pap smear 2008   Adenomatous colon polyp    Allergic rhinitis, cause unspecified    Anxiety    ASCUS with positive high risk HPV    Blood transfusion 1976   Due to ectopic pregnancy   Chronic back pain    Ejection fraction    EF 45-50%, echo, October 27, 2011   Endometrial mass 2011   Esophageal reflux    Fibroid    H/O Clostridium difficile infection 03/2010   H/O menorrhagia 2011   Herpes simplex type 2 infection complicating pregnancy    Hiatal hernia    Iron deficiency anemia    Morbid obesity (HCC) 2010   Obstructive sleep apnea (adult) (pediatric)    cpap   Panic attacks    Plantar fasciitis    Pneumonia 2005; 2007;  2009; 2011   Sarcoidosis 1996   @ [facility]   Simple endometrial hyperplasia 2011   Spinal stenosis, lumbar    s/p decompression laminectomy 09/2013   Syncope    ?? Syncope ??   Type 2 diabetes, diet controlled (HCC)    Unspecified asthma(493.90)    Urinary incontinence, mixed 2011   VIN II (vulvar intraepithelial neoplasia II)    Past Surgical History:  Procedure Laterality Date   COLPOSCOPY VULVA W/ BIOPSY     I've had maybe 3 (09/05/2013)   DILATION AND CURETTAGE OF UTERUS  2007   ECTOPIC PREGNANCY SURGERY  1976; ~ 1978   HYSTEROSCOPY DIAGNOSTIC  2011   LUMBAR  LAMINECTOMY/DECOMPRESSION MICRODISCECTOMY N/A 09/05/2013   Procedure: Thoracic eleven-twelve Thoracic Laminectomy;  Surgeon: Alm GORMAN Molt, MD;  Location: [facility];  Service: Neurosurgery;  Laterality: N/A;  Thoracic eleven-twelve Thoracic Laminectomy   MAXIMUM ACCESS (MAS)POSTERIOR LUMBAR INTERBODY FUSION (PLIF) 1 LEVEL N/A 09/05/2013   Procedure: Lumbar four-five Maximum Access Surgery  Posterior lumbar interbody fusion;  Surgeon: Alm GORMAN Molt, MD;  Location: [facility];  Service: Neurosurgery;  Laterality: N/A;  Lumbar four-five Maximum Access Surgery  Posterior lumbar interbody fusion   POSTERIOR LAMINECTOMY / DECOMPRESSION LUMBAR SPINE  09/05/2013   POSTERIOR LUMBAR FUSION  09/05/2013   THORACIC LAMINECTOMY  09/05/2013   URETHRAL SLING  2011   VULVECTOMY  ~ 2007   for cancerous cells (09/05/2013)   FAMILY HISTORY Family History  Problem Relation Age of Onset   Heart attack Father    Hypertension Father    Diabetes Father    Seizures Father    Migraines Father    Kidney disease Sister    Osteoporosis Sister    Heart attack Sister    Osteoporosis Sister    High Cholesterol Brother        x2   Lung cancer Maternal Uncle    Cancer Paternal Uncle    Glaucoma Maternal Grandfather    Asthma Daughter    Asthma Son    Kidney disease Other    Diabetes Other    Colon cancer Neg Hx    Colon polyps Neg Hx    Liver disease Neg Hx    Stomach cancer Neg Hx    Pancreatic cancer Neg Hx    Esophageal cancer Neg Hx    SOCIAL HISTORY Social History   Tobacco Use   Smoking status: Never   Smokeless tobacco: Never  Vaping Use   Vaping status: Never Used  Substance Use Topics   Alcohol use: Not Currently   Drug use: No       OPHTHALMIC EXAM:  Not recorded    IMAGING AND PROCEDURES  Imaging and Procedures for 07/11/2024         ASSESSMENT/PLAN: No diagnosis found.  1,2. Diabetes mellitus, type 2 without retinopathy  - last A1c 4.8 on 07.23.24 - The incidence, risk  factors for progression, natural history and treatment options for diabetic retinopathy  were discussed with patient.   - The need for close monitoring of blood glucose, blood pressure, and serum lipids, avoiding cigarette or any type of tobacco, and the need for long term follow up was also discussed with patient. - f/u in 1 year, sooner prn  3,4. Hypertensive retinopathy OU - discussed importance of tight BP control - monitor  5. Mixed Cataract OU - The symptoms of cataract, surgical options, and treatments and risks were discussed with patient. - discussed diagnosis and progression - will  refer to Dr. Fleeta for cataract evaluation and management  Ophthalmic Meds Ordered this visit:  No orders of the defined types were placed in this encounter.    No follow-ups on file.  There are no Patient Instructions on file for this visit.  Explained the diagnoses, plan, and follow up with the patient and they expressed understanding.  Patient expressed understanding of the importance of proper follow up care.   This document serves as a record of services personally performed by Redell JUDITHANN Hans, MD, PhD. It was created on their behalf by Almetta Pesa, an ophthalmic technician. The creation of this record is the provider's dictation and/or activities during the visit.    Electronically signed by: Almetta Pesa, OA, 07/02/24  2:39 PM   Redell JUDITHANN Hans, M.D., Ph.D. Diseases & Surgery of the Retina and Vitreous [facility] 07/11/2024   Abbreviations: M myopia (nearsighted); A astigmatism; H hyperopia (farsighted); P presbyopia; Mrx spectacle prescription;  CTL contact lenses; OD right eye; OS left eye; OU both eyes  XT exotropia; ET esotropia; PEK punctate epithelial keratitis; PEE punctate epithelial erosions; DES dry eye syndrome; MGD meibomian gland dysfunction; ATs artificial tears; PFAT's preservative free artificial tears; NSC nuclear sclerotic cataract; PSC  posterior subcapsular cataract; ERM epi-retinal membrane; PVD posterior vitreous detachment; RD retinal detachment; DM diabetes mellitus; DR diabetic retinopathy; NPDR non-proliferative diabetic retinopathy; PDR proliferative diabetic retinopathy; CSME clinically significant macular edema; DME diabetic macular edema; dbh dot blot hemorrhages; CWS cotton wool spot; POAG primary open angle glaucoma; C/D cup-to-disc ratio; HVF humphrey visual field; GVF goldmann visual field; OCT optical coherence tomography; IOP intraocular pressure; BRVO Branch retinal vein occlusion; CRVO central retinal vein occlusion; CRAO central retinal artery occlusion; BRAO branch retinal artery occlusion; RT retinal tear; SB scleral buckle; PPV pars plana vitrectomy; VH Vitreous hemorrhage; PRP panretinal laser photocoagulation; IVK intravitreal kenalog ; VMT vitreomacular traction; MH Macular hole;  NVD neovascularization of the disc; NVE neovascularization elsewhere; AREDS age related eye disease study; ARMD age related macular degeneration; POAG primary open angle glaucoma; EBMD epithelial/anterior basement membrane dystrophy; ACIOL anterior chamber intraocular lens; IOL intraocular lens; PCIOL posterior chamber intraocular lens; Phaco/IOL phacoemulsification with intraocular lens placement; PRK photorefractive keratectomy; LASIK laser assisted in situ keratomileusis; HTN hypertension; DM diabetes mellitus; COPD chronic obstructive pulmonary disease

## 2024-07-05 ENCOUNTER — Ambulatory Visit: Payer: Self-pay | Admitting: *Deleted

## 2024-07-05 NOTE — Telephone Encounter (Signed)
 Copied from CRM [id number]. Topic: Clinical - Red Word Triage >> Jul 05, 2024 12:46 PM Aisha D wrote: Red Word that prompted transfer to Nurse Triage: Pain   Pt stated that she is experiencing pain in her lower back and side coming into her stomach. Pt stated that she has also been having some bladder issues. Pt would like to schedule an appt with PCP.    ----------------------------------------------------------------------- From previous Reason for Contact - Scheduling: Patient/patient representative is calling to schedule an appointment. Refer to attachments for appointment information. Reason for Disposition  [1] MODERATE back pain (e.g., interferes with normal activities) AND [2] present > 3 days  Answer Assessment - Initial Assessment Questions 1. ONSET: When did the pain begin? (e.g., minutes, hours, days)     The back pain starts in my lower back and radiates to both of my sides.   It's not all the time.   But often.   2. LOCATION: Where does it hurt? (upper, mid or lower back)     It I'm in bed when it starts it hurts when I turn.   Like a sharp pain/pinch when I turn.     I have a natural salve I'm using and it helps the pain.   It has different herbs in it.  I take Do-X.   It's a new medication.   I can't take ibuprofen because it bothers my stomach.   My doctor gave me this 3 yrs ago for my knee.   It's from out of [place].   On one around here has it.   It's from a compound mixture.   3. SEVERITY: How bad is the pain?  (e.g., Scale 1-10; mild, moderate, or severe)      My husband has an appt on 8/12 with Dr. Geofm.   4. PATTERN: Is the pain constant? (e.g., yes, no; constant, intermittent)      It comes and goes but it's getting worse.   5. RADIATION: Does the pain shoot into your legs or somewhere else?     No 6. CAUSE:  What do you think is causing the back pain?      I don't know 7. BACK OVERUSE:  Any recent lifting of heavy objects, strenuous work or  exercise?     No  8. MEDICINES: What have you taken so far for the pain? (e.g., nothing, acetaminophen , NSAIDS)     See above. 9. NEUROLOGIC SYMPTOMS: Do you have any weakness, numbness, or problems with bowel/bladder control?     Sometimes it makes my whole back to hurt but mostly my lower back.  Both sides hurt too.   It also affects my incontinence problems.   I'm going frequently.   I take a diuretic to keep the fluid off.  I'm finding when I take it it makes me go to the bathroom more at night time.    I get up hourly sometimes through the night.   I'm so tired the next day. 10. OTHER SYMPTOMS: Do you have any other symptoms? (e.g., fever, abdomen pain, burning with urination, blood in urine)       See above I had a bladder lift 11-12 yrs ago. I have mesh from that surgery.   No burning with urination but I do have incontinence that is getting worse since the back pain has started.   11. PREGNANCY: Is there any chance you are pregnant? When was your last menstrual period?       N/A due  to age  Protocols used: Back Pain-A-AH FYI Only or Action Required?: FYI only for provider.  Patient was last seen in primary care on 04/17/2024 by Geofm Glade PARAS, MD.  Called Nurse Triage reporting Back Pain. Lower back pain radiating around to both sides.  Urinary frequency especially at night that is getting worse.    Symptoms began several weeks ago.  Interventions attempted: OTC medications: Using a natural salve for her back that is helping with the pain.  Symptoms are: gradually worsening.  Pain is intermittent but getting worse over the last several months.   Pt wanted an appt with Dr. Geofm on Aug. 12,2025 because her husband has an appt with her that date.   They like to come together because it's a long drive for them to come in.  Triage Disposition: See PCP Within 2 Weeks  Patient/caregiver understands and will follow disposition?: Yes

## 2024-07-11 ENCOUNTER — Encounter (INDEPENDENT_AMBULATORY_CARE_PROVIDER_SITE_OTHER): Payer: Medicare Other | Admitting: Ophthalmology

## 2024-07-11 DIAGNOSIS — Z7985 Long-term (current) use of injectable non-insulin antidiabetic drugs: Secondary | ICD-10-CM

## 2024-07-11 DIAGNOSIS — E119 Type 2 diabetes mellitus without complications: Secondary | ICD-10-CM

## 2024-07-11 DIAGNOSIS — H35033 Hypertensive retinopathy, bilateral: Secondary | ICD-10-CM

## 2024-07-11 DIAGNOSIS — I10 Essential (primary) hypertension: Secondary | ICD-10-CM

## 2024-07-11 DIAGNOSIS — H25813 Combined forms of age-related cataract, bilateral: Secondary | ICD-10-CM

## 2024-07-16 ENCOUNTER — Encounter: Payer: Self-pay | Admitting: Internal Medicine

## 2024-07-16 NOTE — Patient Instructions (Addendum)
     Medications changes include :   None   For constipation you can --- Docusate 1-3 pills a day Probiotics Metamucil fiber or Benefiber miralax  - which you can take daily if needed Smooth move tea Prunes, prune juice, dates or raisons Magnesium supplement over the counter    A referral was ordered GI for a colonoscopy and pulmonary and someone will call you to schedule an appointment.     Return in about 6 months (around 01/17/2025) for follow up.

## 2024-07-16 NOTE — Progress Notes (Signed)
 Subjective:    Patient ID: Cynthia Kirk, female    DOB: February 19, 1951, 73 y.o.   MRN: [id number]      HPI Cynthia Kirk is here for  Chief Complaint  Patient presents with   Back Pain    Low back pain    Lower back pain -history of L4-5 fusion about 11 years ago.  Has had chronic back pain-saw neurosurgery 03/2023.  At that time had low back pain in the L3-4 region radiating to the right hip.  MRI ordered, but not done because she moved.     Urinary incontinence, urinary frequency -  had bladder lift w/ mesh.  Still w/ incontinence.     Back pain can be severe at times and she has to lay down.  When her pain flares it lasts for days.  She takes duexis for flares.  If she does too much her back may flare up.  Pain in lower back and radiating pain around sides.  No n/t or weakness in legs.  Heat and ice do not help.  Has tried topical  - all natural cream and it helps.  She has to watch her movements.  Pain currently  - none.    She has constipation. BM every couple of days, but varies based on what she eats.     Had estrogen pellets in and was put on progesterone .    Not exercising as much due to back - walks some.     Has been on 2 mg of ozempic  for past 2 weeks.     Medications and allergies reviewed with patient and updated if appropriate.  Current Outpatient Medications on File Prior to Visit  Medication Sig Dispense Refill   progesterone  (PROMETRIUM ) 100 MG capsule Take 100 mg by mouth at bedtime.     albuterol  (PROVENTIL ) (2.5 MG/3ML) 0.083% nebulizer solution TAKE 3ML(2.5MG ) BY NEBULIZATION EVERY 6 HOURS AS NEEDED FOR SHORTNESS OF BREATH (Patient taking differently: Take 2.5 mg by nebulization every 6 (six) hours as needed for wheezing or shortness of breath.) 120 mL 11   albuterol  (VENTOLIN  HFA) 108 (90 Base) MCG/ACT inhaler INHALE 2 PUFF EVERY 6 HOUR as NEEDED FOR WHEEZING ,SHORTNESS OF BREATH 54 g 3   Dexlansoprazole  30 MG capsule DR TAKE 1 CAPSULE BY MOUTH EVERY DAY 90  capsule 1   famotidine  (PEPCID ) 40 MG tablet Take 1 tablet (40 mg total) by mouth 2 (two) times daily. 180 tablet 2   gabapentin  (NEURONTIN ) 100 MG capsule TAKE 1-3 CAPSULES (100-300 MG TOTAL) BY MOUTH AT BEDTIME. 270 capsule 1   hydrochlorothiazide  (HYDRODIURIL ) 25 MG tablet Take 1 tablet (25 mg total) by mouth daily. 90 tablet 1   hydrocortisone  2.5 % cream Apply topically 2 (two) times daily. 30 g 0   hydroquinone  4 % cream Apply 1 application  topically 2 (two) times daily. 28.35 g 0   hydrOXYzine  (ATARAX ) 25 MG tablet TAKE 1 TABLET BY MOUTH AT BEDTIME AS NEEDED FOR ITCHING. 30 tablet 3   ipratropium (ATROVENT ) 0.06 % nasal spray Place 2 sprays into both nostrils 2 (two) times daily. 15 mL 2   Menthol -Methyl Salicylate (MUSCLE RUB) 10-15 % CREA Apply 1 Application topically as needed for muscle pain.     montelukast  (SINGULAIR ) 10 MG tablet TAKE 1 TABLET BY MOUTH EVERYDAY AT BEDTIME 90 tablet 1   Olopatadine  HCl 0.2 % SOLN Apply 1 drop to eye daily as needed (allergies). (Patient taking differently: Place 1 drop into both eyes daily  as needed (allergies).) 2.5 mL 3   Semaglutide , 1 MG/DOSE, 4 MG/3ML SOPN Inject 1 mg as directed once a week. 3 mL 5   No current facility-administered medications on file prior to visit.    Review of Systems     Objective:   Vitals:   07/17/24 1304  BP: 126/80  Pulse: 88  Temp: 98.1 F (36.7 C)  SpO2: 98%   BP Readings from Last 3 Encounters:  07/17/24 126/80  10/19/23 128/72  06/29/23 138/86   Wt Readings from Last 3 Encounters:  07/17/24 193 lb (87.5 kg)  10/19/23 188 lb (85.3 kg)  06/28/23 114 lb 13.8 oz (52.1 kg)   Body mass index is 33.13 kg/m.    Physical Exam Constitutional:      General: She is not in acute distress.    Appearance: Normal appearance. She is not ill-appearing.  HENT:     Head: Normocephalic and atraumatic.  Skin:    General: Skin is warm and dry.  Neurological:     Mental Status: She is alert. Mental status  is at baseline.  Psychiatric:        Mood and Affect: Mood normal.        Behavior: Behavior normal.        Thought Content: Thought content normal.        Judgment: Judgment normal.            Assessment & Plan:    See Problem List for Assessment and Plan of chronic medical problems.

## 2024-07-17 ENCOUNTER — Ambulatory Visit: Admitting: Internal Medicine

## 2024-07-17 VITALS — BP 126/80 | HR 88 | Temp 98.1°F | Ht 64.0 in | Wt 193.0 lb

## 2024-07-17 DIAGNOSIS — I10 Essential (primary) hypertension: Secondary | ICD-10-CM

## 2024-07-17 DIAGNOSIS — K5900 Constipation, unspecified: Secondary | ICD-10-CM | POA: Insufficient documentation

## 2024-07-17 DIAGNOSIS — G4733 Obstructive sleep apnea (adult) (pediatric): Secondary | ICD-10-CM

## 2024-07-17 DIAGNOSIS — R32 Unspecified urinary incontinence: Secondary | ICD-10-CM | POA: Diagnosis not present

## 2024-07-17 DIAGNOSIS — E1169 Type 2 diabetes mellitus with other specified complication: Secondary | ICD-10-CM

## 2024-07-17 DIAGNOSIS — J4520 Mild intermittent asthma, uncomplicated: Secondary | ICD-10-CM

## 2024-07-17 DIAGNOSIS — M48061 Spinal stenosis, lumbar region without neurogenic claudication: Secondary | ICD-10-CM | POA: Diagnosis not present

## 2024-07-17 DIAGNOSIS — Z1211 Encounter for screening for malignant neoplasm of colon: Secondary | ICD-10-CM | POA: Diagnosis not present

## 2024-07-17 DIAGNOSIS — Z7985 Long-term (current) use of injectable non-insulin antidiabetic drugs: Secondary | ICD-10-CM

## 2024-07-17 DIAGNOSIS — D860 Sarcoidosis of lung: Secondary | ICD-10-CM

## 2024-07-17 NOTE — Assessment & Plan Note (Signed)
 Chronic Having increased back pain flares - often related to activity Discussed options - will follow up with neurosurgery and get the MRI recommended last year Continue heat, ice, topical medications Advised to not overdo duexis to prevent kidney damage

## 2024-07-17 NOTE — Assessment & Plan Note (Signed)
 Chronic Blood pressure controlled continue hydrochlorothiazide  25 mg daily Continue to monitor BP at home

## 2024-07-17 NOTE — Assessment & Plan Note (Signed)
 Chronic Lab Results  Component Value Date   HGBA1C 5.1 05/22/2024   Was on ozempic  1 mg weekly but increased back to 2 mg weekly 2 weeks ago -- trying to control weight - this is ok but reviewed dangers of muscle loss since not exercising enough  - stressed eating a lot of protein Diabetic diet

## 2024-07-17 NOTE — Assessment & Plan Note (Signed)
 Chronic Needs to follow up with pulmonary - referral ordered

## 2024-07-17 NOTE — Assessment & Plan Note (Signed)
 Chronic S/p surgery w/ mesh years ago Having incontinence now Will f/u with gyn

## 2024-07-17 NOTE — Assessment & Plan Note (Signed)
 Chronic Ozempic  is likely contributing Better when she eats certain foods - will try to do that consistently Discussed several things she can try to help - discussed controlled her constipation may help her incontinence

## 2024-07-17 NOTE — Assessment & Plan Note (Signed)
 Hx of sarcoidosis Was following with pulmonary  - referral ordered

## 2024-07-17 NOTE — Assessment & Plan Note (Signed)
 Chronic Currently controlled Continue albuterol  as needed, singulair  10 mg daily

## 2024-07-24 ENCOUNTER — Encounter: Payer: Self-pay | Admitting: *Deleted

## 2024-07-24 ENCOUNTER — Encounter: Payer: Self-pay | Admitting: Internal Medicine

## 2024-07-26 ENCOUNTER — Ambulatory Visit

## 2024-07-26 VITALS — Ht 64.0 in | Wt 193.0 lb

## 2024-07-26 DIAGNOSIS — Z0000 Encounter for general adult medical examination without abnormal findings: Secondary | ICD-10-CM | POA: Diagnosis not present

## 2024-07-26 DIAGNOSIS — Z1231 Encounter for screening mammogram for malignant neoplasm of breast: Secondary | ICD-10-CM | POA: Diagnosis not present

## 2024-07-26 NOTE — Progress Notes (Signed)
 Subjective:   Cynthia Kirk is a 73 y.o. who presents for a Medicare Wellness preventive visit.  As a reminder, Annual Wellness Visits don't include a physical exam, and some assessments may be limited, especially if this visit is performed virtually. We may recommend an in-person follow-up visit with your provider if needed.  Visit Complete: Virtual I connected with  Cynthia Kirk on 07/26/24 by a video and audio enabled telemedicine application and verified that I am speaking with the correct person using two identifiers.  Patient Location: Home  Provider Location: Office/Clinic  I discussed the limitations of evaluation and management by telemedicine. The patient expressed understanding and agreed to proceed.  Vital Signs: Because this visit was a virtual/telehealth visit, some criteria may be missing or patient reported. Any vitals not documented were not able to be obtained and vitals that have been documented are patient reported.   Persons Participating in Visit: Patient.  AWV Questionnaire: Yes: Patient Medicare AWV questionnaire was completed by the patient on 07/23/2024; I have confirmed that all information answered by patient is correct and no changes since this date.  Cardiac Risk Factors include: advanced age (>4men, >40 women);hypertension;Other (see comment);diabetes mellitus, Risk factor comments: OSA     Objective:    Today's Vitals   07/23/24 1409 07/26/24 1258  Weight:  193 lb (87.5 kg)  Height:  5' 4 (1.626 m)  PainSc: 10-Worst pain ever    Body mass index is 33.13 kg/m.     06/28/2023    2:33 PM 01/05/2023   12:10 PM 05/22/2020   11:17 AM 09/19/2018   12:07 PM 02/21/2018    8:29 PM 08/05/2017    2:47 PM 04/06/2017    2:27 PM  Advanced Directives  Does Patient Have a Medical Advance Directive? No No Yes Yes  Yes  No  Yes   Type of Advance Directive   Living will;Healthcare Power of [employer] Power of [place];Living will Healthcare Power of  [place];Living will  Healthcare Power of [place];Living will  Does patient want to make changes to medical advance directive?   No - Patient declined  No - Patient declined     Copy of Healthcare Power of Attorney in Chart?   No - copy requested No - copy requested  No - copy requested   No - copy requested   Would patient like information on creating a medical advance directive? No - Patient declined No - Patient declined          Data saved with a previous flowsheet row definition    Current Medications (verified) Outpatient Encounter Medications as of 07/26/2024  Medication Sig   albuterol  (PROVENTIL ) (2.5 MG/3ML) 0.083% nebulizer solution TAKE 3ML(2.5MG ) BY NEBULIZATION EVERY 6 HOURS AS NEEDED FOR SHORTNESS OF BREATH (Patient taking differently: Take 2.5 mg by nebulization every 6 (six) hours as needed for wheezing or shortness of breath.)   albuterol  (VENTOLIN  HFA) 108 (90 Base) MCG/ACT inhaler INHALE 2 PUFF EVERY 6 HOUR as NEEDED FOR WHEEZING ,SHORTNESS OF BREATH   Dexlansoprazole  30 MG capsule DR TAKE 1 CAPSULE BY MOUTH EVERY DAY   famotidine  (PEPCID ) 40 MG tablet Take 1 tablet (40 mg total) by mouth 2 (two) times daily.   hydrochlorothiazide  (HYDRODIURIL ) 25 MG tablet Take 1 tablet (25 mg total) by mouth daily.   hydrocortisone  2.5 % cream Apply topically 2 (two) times daily.   hydroquinone  4 % cream Apply 1 application  topically 2 (two) times daily.   hydrOXYzine  (ATARAX ) 25 MG  tablet TAKE 1 TABLET BY MOUTH AT BEDTIME AS NEEDED FOR ITCHING.   ipratropium (ATROVENT ) 0.06 % nasal spray Place 2 sprays into both nostrils 2 (two) times daily.   Menthol -Methyl Salicylate (MUSCLE RUB) 10-15 % CREA Apply 1 Application topically as needed for muscle pain.   montelukast  (SINGULAIR ) 10 MG tablet TAKE 1 TABLET BY MOUTH EVERYDAY AT BEDTIME   Olopatadine  HCl 0.2 % SOLN Apply 1 drop to eye daily as needed (allergies). (Patient taking differently: Place 1 drop into both eyes daily as needed  (allergies).)   Semaglutide , 2 MG/DOSE, (OZEMPIC , 2 MG/DOSE,) 8 MG/3ML SOPN Inject 2 mg into the skin once a week.   progesterone  (PROMETRIUM ) 100 MG capsule Take 100 mg by mouth at bedtime.   No facility-administered encounter medications on file as of 07/26/2024.    Allergies (verified) Aspirin, Ibuprofen, and Latex   History: Past Medical History:  Diagnosis Date   Abnormal Pap smear 2008   Adenomatous colon polyp    Allergic rhinitis, cause unspecified    Anxiety    ASCUS with positive high risk HPV    Blood transfusion 1976   Due to ectopic pregnancy   Chronic back pain    Ejection fraction    EF 45-50%, echo, October 27, 2011   Endometrial mass 2011   Esophageal reflux    Fibroid    H/O Clostridium difficile infection 03/2010   H/O menorrhagia 2011   Herpes simplex type 2 infection complicating pregnancy    Hiatal hernia    Iron deficiency anemia    Morbid obesity (HCC) 2010   Obstructive sleep apnea (adult) (pediatric)    cpap   Panic attacks    Plantar fasciitis    Pneumonia 2005; 2007; 2009; 2011   Sarcoidosis 1996   @ [facility]   Simple endometrial hyperplasia 2011   Spinal stenosis, lumbar    s/p decompression laminectomy 09/2013   Syncope    ?? Syncope ??   Type 2 diabetes, diet controlled (HCC)    Unspecified asthma(493.90)    Urinary incontinence, mixed 2011   VIN II (vulvar intraepithelial neoplasia II)    Past Surgical History:  Procedure Laterality Date   COLPOSCOPY VULVA W/ BIOPSY     I've had maybe 3 (09/05/2013)   DILATION AND CURETTAGE OF UTERUS  2007   ECTOPIC PREGNANCY SURGERY  1976; ~ 1978   HYSTEROSCOPY DIAGNOSTIC  2011   LUMBAR LAMINECTOMY/DECOMPRESSION MICRODISCECTOMY N/A 09/05/2013   Procedure: Thoracic eleven-twelve Thoracic Laminectomy;  Surgeon: Alm GORMAN Molt, MD;  Location: [facility];  Service: Neurosurgery;  Laterality: N/A;  Thoracic eleven-twelve Thoracic Laminectomy   MAXIMUM ACCESS (MAS)POSTERIOR LUMBAR INTERBODY FUSION  (PLIF) 1 LEVEL N/A 09/05/2013   Procedure: Lumbar four-five Maximum Access Surgery  Posterior lumbar interbody fusion;  Surgeon: Alm GORMAN Molt, MD;  Location: [facility];  Service: Neurosurgery;  Laterality: N/A;  Lumbar four-five Maximum Access Surgery  Posterior lumbar interbody fusion   POSTERIOR LAMINECTOMY / DECOMPRESSION LUMBAR SPINE  09/05/2013   POSTERIOR LUMBAR FUSION  09/05/2013   THORACIC LAMINECTOMY  09/05/2013   URETHRAL SLING  2011   VULVECTOMY  ~ 2007   for cancerous cells (09/05/2013)   Family History  Problem Relation Age of Onset   Heart attack Father    Hypertension Father    Diabetes Father    Seizures Father    Migraines Father    Kidney disease Sister    Osteoporosis Sister    Heart attack Sister    Osteoporosis Sister  High Cholesterol Brother        x2   Lung cancer Maternal Uncle    Cancer Paternal Uncle    Glaucoma Maternal Grandfather    Asthma Daughter    Asthma Son    Kidney disease Other    Diabetes Other    Colon cancer Neg Hx    Colon polyps Neg Hx    Liver disease Neg Hx    Stomach cancer Neg Hx    Pancreatic cancer Neg Hx    Esophageal cancer Neg Hx    Social History   Socioeconomic History   Marital status: Married    Spouse name: Not on file   Number of children: 2   Years of education: PHD   Highest education level: Patent examiner History   Occupation: retired    Associate Professor: UNEMPLOYED  Tobacco Use   Smoking status: Never   Smokeless tobacco: Never  Vaping Use   Vaping status: Never Used  Substance and Sexual Activity   Alcohol use: Not Currently   Drug use: No   Sexual activity: Yes    Birth control/protection: Post-menopausal  Other Topics Concern   Not on file  Social History Narrative   Pt lives at home with her spouse./2025   She does not use caffeine.   Social Drivers of Health   Financial Resource Strain: Medium Risk (02/24/2023)   Overall Financial Resource Strain (CARDIA)    Difficulty of Paying  Living Expenses: Somewhat hard  Food Insecurity: No Food Insecurity (06/28/2023)   Hunger Vital Sign    Worried About Running Out of Food in the Last Year: Never true    Ran Out of Food in the Last Year: Never true  Transportation Needs: No Transportation Needs (07/26/2024)   PRAPARE - Administrator, Civil Service (Medical): No    Lack of Transportation (Non-Medical): No  Physical Activity: Insufficiently Active (07/26/2024)   Exercise Vital Sign    Days of Exercise per Week: 3 days    Minutes of Exercise per Session: 30 min  Stress: Stress Concern Present (02/24/2023)   [employer] of Occupational Health - Occupational Stress Questionnaire    Feeling of Stress : To some extent  Social Connections: Moderately Integrated (07/26/2024)   Social Connection and Isolation Panel    Frequency of Communication with Friends and Family: Never    Frequency of Social Gatherings with Friends and Family: Never    Attends Religious Services: More than 4 times per year    Active Member of [employer] or Organizations: Yes    Attends Banker Meetings: 1 to 4 times per year    Marital Status: Married    Tobacco Counseling Counseling given: Not Answered    Clinical Intake:  Pre-visit preparation completed: Yes  Pain : 0-10 Pain Score: 10-Worst pain ever Pain Location: Head Pain Orientation:  (top) Pain Descriptors / Indicators: Aching, Discomfort, Headache Pain Onset: In the past 7 days Pain Frequency: Constant (constant for a couple of hours) Pain Relieving Factors: nothing Effect of Pain on Daily Activities: blurry when she first wakes up since having the headache/  Pain Relieving Factors: nothing  BMI - recorded: 33.13 Nutritional Status: BMI > 30  Obese Nutritional Risks: None Diabetes: Yes CBG done?: No Did pt. bring in CBG monitor from home?: No  Lab Results  Component Value Date   HGBA1C 5.1 05/22/2024   HGBA1C 4.8 06/28/2023   HGBA1C 5.0 03/15/2023      How often do  you need to have someone help you when you read instructions, pamphlets, or other written materials from your doctor or pharmacy?: 1 - Never     Information entered by :: Boyde Grieco, RMA   Activities of Daily Living     07/23/2024    2:09 PM  In your present state of health, do you have any difficulty performing the following activities:  Hearing? 1  Vision? 0  Difficulty concentrating or making decisions? 1  Walking or climbing stairs? 0  Dressing or bathing? 0  Doing errands, shopping? 0  Preparing Food and eating ? N  Using the Toilet? N  In the past six months, have you accidently leaked urine? Y  Managing your Medications? N  Managing your Finances? N  Housekeeping or managing your Housekeeping? N    Patient Care Team: Geofm Glade PARAS, MD as PCP - General (Internal Medicine) Bonner Ade, MD as Consulting Physician (Physical Medicine and Rehabilitation) Shellia Oh, MD (Inactive) as Consulting Physician (Pulmonary Disease) Aneita Gwendlyn DASEN, MD (Inactive) as Consulting Physician (Gastroenterology) Carlie Clark, MD as Consulting Physician (Otolaryngology) Henry Slough, MD as Consulting Physician (Obstetrics and Gynecology) Joshua Alm Hamilton, MD (Neurosurgery) Okey Vina GAILS, MD as Consulting Physician (Cardiology) Fate Morna SAILOR, [facility] (Inactive) as Pharmacist (Pharmacist)  I have updated your Care Teams any recent Medical Services you may have received from other providers in the past year.     Assessment:   This is a routine wellness examination for Cynthia Kirk.  Hearing/Vision screen Hearing Screening - Comments:: Has some hearing issues-per pt Vision Screening - Comments:: Wears eyeglasses/Dr. Katha   Goals Addressed   None    Depression Screen     07/26/2024    1:28 PM 10/19/2023    2:42 PM 03/15/2023    3:42 PM 08/14/2020    1:36 PM 04/30/2020    1:55 PM 09/19/2018   12:08 PM 04/06/2017    2:27 PM  PHQ 2/9 Scores  PHQ - 2  Score 0 0 0 1 0 0 0  PHQ- 9 Score 7 2  3        Fall Risk     07/23/2024    2:09 PM 10/19/2023    2:42 PM 05/22/2020   11:18 AM 04/30/2020    1:54 PM 10/31/2019    9:36 AM  Fall Risk   Falls in the past year? 0 0 0 0 0   Comment     Emmi Telephone Survey: data to providers prior to load   Number falls in past yr: 0 0 0 0   Injury with Fall? 0 0 0 0   Risk for fall due to :  No Fall Risks No Fall Risks    Follow up Falls evaluation completed;Falls prevention discussed Falls evaluation completed Falls evaluation completed  Falls evaluation completed       Data saved with a previous flowsheet row definition    MEDICARE RISK AT HOME:  Medicare Risk at Home Any stairs in or around the home?: (Patient-Rptd) No Home free of loose throw rugs in walkways, pet beds, electrical cords, etc?: (Patient-Rptd) Yes Adequate lighting in your home to reduce risk of falls?: (Patient-Rptd) Yes Life alert?: (Patient-Rptd) No Use of a cane, walker or w/c?: (Patient-Rptd) No Grab bars in the bathroom?: (Patient-Rptd) No Shower chair or bench in shower?: (Patient-Rptd) Yes Elevated toilet seat or a handicapped toilet?: (Patient-Rptd) Yes  TIMED UP AND GO:  Was the test performed?  No  Cognitive Function: Declined/Normal: No cognitive  concerns noted by patient or family. Patient alert, oriented, able to answer questions appropriately and recall recent events. No signs of memory loss or confusion.        Immunizations Immunization History  Administered Date(s) Administered   Fluad  Quad(high Dose 65+) 09/20/2019, 12/30/2020, 09/15/2022   Fluad  Trivalent(High Dose 65+) 10/19/2023   Influenza Split 10/06/2011   Influenza Whole 10/14/2009, 09/14/2010   Influenza, High Dose Seasonal PF 08/19/2016, 08/16/2017   Influenza, Seasonal, Injecte, Preservative Fre 01/29/2013   Influenza,inj,Quad PF,6+ Mos 08/16/2013, 09/09/2014, 01/02/2016   Influenza,inj,quad, With Preservative 08/06/2017   PFIZER  Comirnaty (Gray Top)Covid-19 Tri-Sucrose Vaccine 09/15/2022   PFIZER(Purple Top)SARS-COV-2 Vaccination 03/13/2020, 04/03/2020, 11/14/2020   Pfizer Covid-19 Vaccine Bivalent Booster 47yrs & up 06/23/2021, 10/13/2021   Pfizer(Comirnaty )Fall Seasonal Vaccine 12 years and older 09/15/2022   Pneumococcal Conjugate-13 11/15/2014   Pneumococcal Polysaccharide-23 03/06/2008, 08/19/2016   Td 12/06/2004   Tdap 07/09/2015    Screening Tests Health Maintenance  Topic Date Due   Zoster Vaccines- Shingrix (1 of 2) Never done   MAMMOGRAM  04/01/2016   FOOT EXAM  02/24/2019   DEXA SCAN  08/31/2021   Colonoscopy  09/29/2021   COVID-19 Vaccine (7 - 2024-25 season) 08/07/2023   INFLUENZA VACCINE  07/06/2024   HEMOGLOBIN A1C  11/21/2024   OPHTHALMOLOGY EXAM  02/12/2025   Diabetic kidney evaluation - eGFR measurement  05/22/2025   Diabetic kidney evaluation - Urine ACR  05/22/2025   DTaP/Tdap/Td (3 - Td or Tdap) 07/08/2025   Medicare Annual Wellness (AWV)  07/26/2025   Pneumococcal Vaccine: 50+ Years  Completed   Hepatitis C Screening  Completed   HPV VACCINES  Aged Out   Meningococcal B Vaccine  Aged Out    Health Maintenance  Health Maintenance Due  Topic Date Due   Zoster Vaccines- Shingrix (1 of 2) Never done   MAMMOGRAM  04/01/2016   FOOT EXAM  02/24/2019   DEXA SCAN  08/31/2021   Colonoscopy  09/29/2021   COVID-19 Vaccine (7 - 2024-25 season) 08/07/2023   INFLUENZA VACCINE  07/06/2024   Health Maintenance Items Addressed: See Nurse Notes at the end of this note  Additional Screening:  Vision Screening: Recommended annual ophthalmology exams for early detection of glaucoma and other disorders of the eye. Would you like a referral to an eye doctor? No    Dental Screening: Recommended annual dental exams for proper oral hygiene  Community Resource Referral / Chronic Care Management: CRR required this visit?  No   CCM required this visit?  No   Plan:    I have personally  reviewed and noted the following in the patient's chart:   Medical and social history Use of alcohol, tobacco or illicit drugs  Current medications and supplements including opioid prescriptions. Patient is not currently taking opioid prescriptions. Functional ability and status Nutritional status Physical activity Advanced directives List of other physicians Hospitalizations, surgeries, and ER visits in previous 12 months Vitals Screenings to include cognitive, depression, and falls Referrals and appointments  In addition, I have reviewed and discussed with patient certain preventive protocols, quality metrics, and best practice recommendations. A written personalized care plan for preventive services as well as general preventive health recommendations were provided to patient.   Mykell Genao L Clarisse Rodriges, CMA   07/26/2024   After Visit Summary: (MyChart) Due to this being a telephonic visit, the after visit summary with patients personalized plan was offered to patient via MyChart   Notes: Patient stated that she has had a headache for  a few days now.  She denied having dizziness or vomiting, but having blurred vision when she wakes up in the mornings and then it goes away.  Patient stated that she has not taken anything for the headache.  She also stated that her Bp was 1345/78 last night before going to bed.  I informed patient about symptoms of when to seek emergency care, ASAP.  Patient is due for a mammogram and a DEXA, which she stated that she would get her GYN to place orders for them both.  Patient declines the Shingrix vaccine.

## 2024-07-26 NOTE — Patient Instructions (Signed)
 Cynthia Kirk , Thank you for taking time out of your busy schedule to complete your Annual Wellness Visit with me. I enjoyed our conversation and look forward to speaking with you again next year. I, as well as your care team,  appreciate your ongoing commitment to your health goals. Please review the following plan we discussed and let me know if I can assist you in the future. Your Game plan/ To Do List    Follow up Visits: We will see or speak with you next year for your Next Medicare AWV with our clinical staff Have you seen your provider in the last 6 months (3 months if uncontrolled diabetes)? Yes.  Last office visit on 07/17/2024.  Clinician Recommendations:  Aim for 30 minutes of exercise or brisk walking, 6-8 glasses of water, and 5 servings of fruits and vegetables each day. You are due for a mammogram and a bone density, please get order from the GYN, you are going to see.  Please call [facility] to schedule for your colonoscopy, as Dr. Geofm has placed the order for it.      This is a list of the screenings recommended for you:  Health Maintenance  Topic Date Due   Zoster (Shingles) Vaccine (1 of 2) Never done   Mammogram  04/01/2016   Complete foot exam   02/24/2019   DEXA scan (bone density measurement)  08/31/2021   Colon Cancer Screening  09/29/2021   COVID-19 Vaccine (7 - 2024-25 season) 08/07/2023   Flu Shot  07/06/2024   Hemoglobin A1C  11/21/2024   Eye exam for diabetics  02/12/2025   Yearly kidney function blood test for diabetes  05/22/2025   Yearly kidney health urinalysis for diabetes  05/22/2025   DTaP/Tdap/Td vaccine (3 - Td or Tdap) 07/08/2025   Medicare Annual Wellness Visit  07/26/2025   Pneumococcal Vaccine for age over 23  Completed   Hepatitis C Screening  Completed   HPV Vaccine  Aged Out   Meningitis B Vaccine  Aged Out    Advanced directives: (Declined) Advance directive discussed with you today. Even though you declined this today, please call our  office should you change your mind, and we can give you the proper paperwork for you to fill out. Advance Care Planning is important because it:  [x]  Makes sure you receive the medical care that is consistent with your values, goals, and preferences  [x]  It provides guidance to your family and loved ones and reduces their decisional burden about whether or not they are making the right decisions based on your wishes.  Follow the link provided in your after visit summary or read over the paperwork we have mailed to you to help you started getting your Advance Directives in place. If you need assistance in completing these, please reach out to us  so that we can help you!  See attachments for Preventive Care and Fall Prevention Tips.

## 2024-08-08 ENCOUNTER — Other Ambulatory Visit: Payer: Self-pay | Admitting: Internal Medicine

## 2024-08-28 ENCOUNTER — Other Ambulatory Visit: Payer: Self-pay | Admitting: Internal Medicine

## 2024-08-28 DIAGNOSIS — R0789 Other chest pain: Secondary | ICD-10-CM

## 2024-09-03 ENCOUNTER — Other Ambulatory Visit

## 2024-09-30 ENCOUNTER — Other Ambulatory Visit: Payer: Self-pay | Admitting: Internal Medicine

## 2024-10-16 ENCOUNTER — Other Ambulatory Visit

## 2024-11-18 ENCOUNTER — Other Ambulatory Visit: Payer: Self-pay | Admitting: Internal Medicine

## 2024-12-01 ENCOUNTER — Other Ambulatory Visit: Payer: Self-pay | Admitting: Internal Medicine

## 2024-12-01 DIAGNOSIS — J45909 Unspecified asthma, uncomplicated: Secondary | ICD-10-CM
